# Patient Record
Sex: Female | Born: 1945 | Race: Black or African American | Hispanic: No | Smoking: Former smoker
Health system: Southern US, Community
[De-identification: ages and names within clinical notes are randomized; demographics above are authoritative.]

## PROBLEM LIST (undated history)

## (undated) DIAGNOSIS — E8881 Metabolic syndrome: Secondary | ICD-10-CM

## (undated) DIAGNOSIS — K5730 Diverticulosis of large intestine without perforation or abscess without bleeding: Secondary | ICD-10-CM

## (undated) DIAGNOSIS — K862 Cyst of pancreas: Secondary | ICD-10-CM

## (undated) DIAGNOSIS — N189 Chronic kidney disease, unspecified: Secondary | ICD-10-CM

## (undated) DIAGNOSIS — E119 Type 2 diabetes mellitus without complications: Secondary | ICD-10-CM

## (undated) DIAGNOSIS — I10 Essential (primary) hypertension: Secondary | ICD-10-CM

## (undated) DIAGNOSIS — Z9981 Dependence on supplemental oxygen: Secondary | ICD-10-CM

## (undated) DIAGNOSIS — E039 Hypothyroidism, unspecified: Secondary | ICD-10-CM

## (undated) DIAGNOSIS — M1990 Unspecified osteoarthritis, unspecified site: Secondary | ICD-10-CM

## (undated) DIAGNOSIS — E785 Hyperlipidemia, unspecified: Secondary | ICD-10-CM

## (undated) DIAGNOSIS — T7840XA Allergy, unspecified, initial encounter: Secondary | ICD-10-CM

## (undated) DIAGNOSIS — M109 Gout, unspecified: Secondary | ICD-10-CM

## (undated) DIAGNOSIS — R011 Cardiac murmur, unspecified: Secondary | ICD-10-CM

## (undated) DIAGNOSIS — H269 Unspecified cataract: Secondary | ICD-10-CM

## (undated) DIAGNOSIS — D126 Benign neoplasm of colon, unspecified: Secondary | ICD-10-CM

## (undated) DIAGNOSIS — N289 Disorder of kidney and ureter, unspecified: Secondary | ICD-10-CM

## (undated) DIAGNOSIS — Z8639 Personal history of other endocrine, nutritional and metabolic disease: Secondary | ICD-10-CM

## (undated) DIAGNOSIS — H409 Unspecified glaucoma: Secondary | ICD-10-CM

## (undated) DIAGNOSIS — R0902 Hypoxemia: Secondary | ICD-10-CM

## (undated) DIAGNOSIS — Z789 Other specified health status: Secondary | ICD-10-CM

## (undated) HISTORY — DX: Metabolic syndrome: E88.81

## (undated) HISTORY — DX: Type 2 diabetes mellitus without complications: E11.9

## (undated) HISTORY — DX: Diverticulosis of large intestine without perforation or abscess without bleeding: K57.30

## (undated) HISTORY — DX: Hyperlipidemia, unspecified: E78.5

## (undated) HISTORY — DX: Personal history of other endocrine, nutritional and metabolic disease: Z86.39

## (undated) HISTORY — DX: Essential (primary) hypertension: I10

## (undated) HISTORY — DX: Unspecified osteoarthritis, unspecified site: M19.90

## (undated) HISTORY — PX: COLONOSCOPY: SHX174

## (undated) HISTORY — DX: Allergy, unspecified, initial encounter: T78.40XA

## (undated) HISTORY — DX: Unspecified glaucoma: H40.9

## (undated) HISTORY — DX: Unspecified cataract: H26.9

## (undated) HISTORY — DX: Hypoxemia: R09.02

## (undated) HISTORY — DX: Disorder of kidney and ureter, unspecified: N28.9

## (undated) HISTORY — DX: Chronic kidney disease, unspecified: N18.9

## (undated) HISTORY — DX: Cardiac murmur, unspecified: R01.1

## (undated) HISTORY — DX: Cyst of pancreas: K86.2

## (undated) HISTORY — DX: Benign neoplasm of colon, unspecified: D12.6

## (undated) HISTORY — PX: TONSILLECTOMY: SUR1361

## (undated) HISTORY — DX: Metabolic syndrome: E88.810

## (undated) HISTORY — PX: POLYPECTOMY: SHX149

## (undated) HISTORY — PX: EYE SURGERY: SHX253

## (undated) HISTORY — PX: WISDOM TOOTH EXTRACTION: SHX21

---

## 1979-04-02 HISTORY — PX: THYROIDECTOMY, PARTIAL: SHX18

## 1998-04-01 HISTORY — PX: UMBILICAL HERNIA REPAIR: SHX196

## 1998-04-01 HISTORY — PX: HERNIA REPAIR: SHX51

## 2012-01-22 LAB — LIPID PANEL
HDL: 34 mg/dL — AB (ref 35–70)
LDL Cholesterol: 60 mg/dL
LDl/HDL Ratio: 3.2

## 2012-01-22 LAB — BASIC METABOLIC PANEL
Creatinine: 1.4 mg/dL — AB (ref 0.5–1.1)
Glucose: 0 mg/dL

## 2012-01-22 LAB — HEPATIC FUNCTION PANEL
Alkaline Phosphatase: 71 U/L (ref 25–125)
Bilirubin, Total: 0.5 mg/dL

## 2012-01-22 LAB — CBC AND DIFFERENTIAL: WBC: 5.9 10^3/mL

## 2012-01-27 LAB — HM MAMMOGRAPHY: HM Mammogram: NORMAL

## 2012-10-27 ENCOUNTER — Ambulatory Visit (INDEPENDENT_AMBULATORY_CARE_PROVIDER_SITE_OTHER): Payer: Medicare Other | Admitting: Internal Medicine

## 2012-10-27 ENCOUNTER — Encounter: Payer: Self-pay | Admitting: *Deleted

## 2012-10-27 ENCOUNTER — Other Ambulatory Visit (INDEPENDENT_AMBULATORY_CARE_PROVIDER_SITE_OTHER): Payer: Medicare Other

## 2012-10-27 ENCOUNTER — Encounter: Payer: Self-pay | Admitting: Internal Medicine

## 2012-10-27 VITALS — BP 130/82 | HR 66 | Temp 98.3°F | Wt 211.0 lb

## 2012-10-27 DIAGNOSIS — E669 Obesity, unspecified: Secondary | ICD-10-CM | POA: Insufficient documentation

## 2012-10-27 DIAGNOSIS — I10 Essential (primary) hypertension: Secondary | ICD-10-CM

## 2012-10-27 DIAGNOSIS — Z13820 Encounter for screening for osteoporosis: Secondary | ICD-10-CM

## 2012-10-27 DIAGNOSIS — E785 Hyperlipidemia, unspecified: Secondary | ICD-10-CM | POA: Insufficient documentation

## 2012-10-27 DIAGNOSIS — I129 Hypertensive chronic kidney disease with stage 1 through stage 4 chronic kidney disease, or unspecified chronic kidney disease: Secondary | ICD-10-CM | POA: Insufficient documentation

## 2012-10-27 DIAGNOSIS — N183 Chronic kidney disease, stage 3 unspecified: Secondary | ICD-10-CM | POA: Insufficient documentation

## 2012-10-27 LAB — COMPREHENSIVE METABOLIC PANEL
ALT: 21 U/L (ref 0–35)
CO2: 24 mEq/L (ref 19–32)
Calcium: 10 mg/dL (ref 8.4–10.5)
Chloride: 107 mEq/L (ref 96–112)
GFR: 50.32 mL/min — ABNORMAL LOW (ref >60.00)
Sodium: 138 mEq/L (ref 135–145)
Total Protein: 7.4 g/dL (ref 6.0–8.3)

## 2012-10-27 LAB — HEMOGLOBIN A1C: Hgb A1c MFr Bld: 5.9 % (ref 4.6–6.5)

## 2012-10-27 LAB — CBC
Hemoglobin: 13.4 g/dL (ref 12.0–15.0)
RDW: 13.5 % (ref 11.5–14.6)
WBC: 7.2 10*3/uL (ref 4.5–10.5)

## 2012-10-27 NOTE — Progress Notes (Signed)
HPI  Pt presents to the clinic today to establish care. She recently moved from [place]. She is transferring care from her PCP in [place]. She will need a referral to a kidney specialist for management of her CKD. She has no concerns today.  Flu: yearly Tetanus: unsure Pneumovax: 2013 Zostavax: never Pap smears: 2013 Mammogram: 2013 Bone Density: never Colonoscopy: 2015 Eye doctor: yearly Dentist: yearly  Past Medical History  Diagnosis Date  . Arthritis   . Hyperlipidemia   . Hypertension   . Chronic kidney disease     Current Outpatient Prescriptions  Medication Sig Dispense Refill  . amLODipine (NORVASC) 10 MG tablet Take 10 mg by mouth daily.      . Aspirin-Acetaminophen-Caffeine (EXCEDRIN PO) Take 1 tablet by mouth as needed.      Marland Kitchen atorvastatin (LIPITOR) 20 MG tablet Take 20 mg by mouth daily.      . benazepril (LOTENSIN) 40 MG tablet Take 40 mg by mouth daily.      . carvedilol (COREG) 25 MG tablet Take 25 mg by mouth 2 (two) times daily with a meal.      . cloNIDine (CATAPRES) 0.2 MG tablet Take 0.2 mg by mouth 3 (three) times daily.      . fish oil-omega-3 fatty acids 1000 MG capsule Take 2 g by mouth daily.      . hydrALAZINE (APRESOLINE) 50 MG tablet Take 50 mg by mouth 2 (two) times daily.      . Multiple Vitamin (MULTIVITAMIN) tablet Take 1 tablet by mouth daily.      Marland Kitchen spironolactone (ALDACTONE) 100 MG tablet Take 100 mg by mouth daily.       No current facility-administered medications for this visit.    Not on File  History reviewed. No pertinent family history.  History   Social History  . Marital Status: Divorced    Spouse Name: N/A    Number of Children: N/A  . Years of Education: N/A   Occupational History  . Not on file.   Social History Main Topics  . Smoking status: Not on file  . Smokeless tobacco: Not on file  . Alcohol Use: Not on file  . Drug Use: Not on file  . Sexually Active: Not on file   Other Topics Concern  . Not on file    Social History Narrative  . No narrative on file    ROS:  Constitutional: Denies fever, malaise, fatigue, headache or abrupt weight changes.  HEENT: Denies eye pain, eye redness, ear pain, ringing in the ears, wax buildup, runny nose, nasal congestion, bloody nose, or sore throat. Respiratory: Denies difficulty breathing, shortness of breath, cough or sputum production.   Cardiovascular: Denies chest pain, chest tightness, palpitations or swelling in the hands or feet.  Gastrointestinal: Denies abdominal pain, bloating, constipation, diarrhea or blood in the stool.  GU: Denies frequency, urgency, pain with urination, blood in urine, odor or discharge. Musculoskeletal: Denies decrease in range of motion, difficulty with gait, muscle pain or joint pain and swelling.  Skin: Denies redness, rashes, lesions or ulcercations.  Neurological: Denies dizziness, difficulty with memory, difficulty with speech or problems with balance and coordination.   No other specific complaints in a complete review of systems (except as listed in HPI above).  PE:  BP 130/82  Pulse 66  Temp(Src) 98.3 F (36.8 C) (Oral)  Wt 211 lb (95.709 kg)  SpO2 98% Wt Readings from Last 3 Encounters:  10/27/12 211 lb (95.709 kg)  General: Appears her stated age, well developed, well nourished in NAD. HEENT: Head: normal shape and size; Eyes: sclera white, no icterus, conjunctiva pink, PERRLA and EOMs intact; Ears: Tm's gray and intact, normal light reflex; Nose: mucosa pink and moist, septum midline; Throat/Mouth: Teeth present, mucosa pink and moist, no lesions or ulcerations noted.  Neck: Normal range of motion. Neck supple, trachea midline. No massses, lumps or thyromegaly present.  Cardiovascular: Normal rate and rhythm. S1,S2 noted.  No murmur, rubs or gallops noted. No JVD or BLE edema. No carotid bruits noted. Pulmonary/Chest: Normal effort and positive vesicular breath sounds. No respiratory distress. No  wheezes, rales or ronchi noted.  Abdomen: Soft and nontender. Normal bowel sounds, no bruits noted. No distention or masses noted. Liver, spleen and kidneys non palpable. Musculoskeletal: Normal range of motion. Large right shoulder effusion. No difficulty with gait.  Neurological: Alert and oriented. Cranial nerves II-XII intact. Coordination normal. +DTRs bilaterally. Psychiatric: Mood and affect normal. Behavior is normal. Judgment and thought content normal.      Assessment and Plan:  Prevent health:  Will obtain labs today Will obtain records to see about immunizations Call your insurance company to find out about Zostavax Will order dexa scan

## 2012-10-27 NOTE — Patient Instructions (Signed)
DASH Diet  The DASH diet stands for "Dietary Approaches to Stop Hypertension." It is a healthy eating plan that has been shown to reduce high blood pressure (hypertension) in as little as 14 days, while also possibly providing other significant health benefits. These other health benefits include reducing the risk of breast cancer after menopause and reducing the risk of type 2 diabetes, heart disease, colon cancer, and stroke. Health benefits also include weight loss and slowing kidney failure in patients with chronic kidney disease.   DIET GUIDELINES  · Limit salt (sodium). Your diet should contain less than 1500 mg of sodium daily.  · Limit refined or processed carbohydrates. Your diet should include mostly whole grains. Desserts and added sugars should be used sparingly.  · Include small amounts of heart-healthy fats. These types of fats include nuts, oils, and tub margarine. Limit saturated and trans fats. These fats have been shown to be harmful in the body.  CHOOSING FOODS   The following food groups are based on a 2000 calorie diet. See your Registered Dietitian for individual calorie needs.  Grains and Grain Products (6 to 8 servings daily)  · Eat More Often: Whole-wheat bread, brown rice, whole-grain or wheat pasta, quinoa, popcorn without added fat or salt (air popped).  · Eat Less Often: White bread, white pasta, white rice, cornbread.  Vegetables (4 to 5 servings daily)  · Eat More Often: Fresh, frozen, and canned vegetables. Vegetables may be raw, steamed, roasted, or grilled with a minimal amount of fat.  · Eat Less Often/Avoid: Creamed or fried vegetables. Vegetables in a cheese sauce.  Fruit (4 to 5 servings daily)  · Eat More Often: All fresh, canned (in natural juice), or frozen fruits. Dried fruits without added sugar. One hundred percent fruit juice (½ cup [237 mL] daily).  · Eat Less Often: Dried fruits with added sugar. Canned fruit in light or heavy syrup.  Lean Meats, Fish, and Poultry (2  servings or less daily. One serving is 3 to 4 oz [85-114 g]).  · Eat More Often: Ninety percent or leaner ground beef, tenderloin, sirloin. Round cuts of beef, chicken breast, turkey breast. All fish. Grill, bake, or broil your meat. Nothing should be fried.  · Eat Less Often/Avoid: Fatty cuts of meat, turkey, or chicken leg, thigh, or wing. Fried cuts of meat or fish.  Dairy (2 to 3 servings)  · Eat More Often: Low-fat or fat-free milk, low-fat plain or light yogurt, reduced-fat or part-skim cheese.  · Eat Less Often/Avoid: Milk (whole, 2%). Whole milk yogurt. Full-fat cheeses.  Nuts, Seeds, and Legumes (4 to 5 servings per week)  · Eat More Often: All without added salt.  · Eat Less Often/Avoid: Salted nuts and seeds, canned beans with added salt.  Fats and Sweets (limited)  · Eat More Often: Vegetable oils, tub margarines without trans fats, sugar-free gelatin. Mayonnaise and salad dressings.  · Eat Less Often/Avoid: Coconut oils, palm oils, butter, stick margarine, cream, half and half, cookies, candy, pie.  FOR MORE INFORMATION  The Dash Diet Eating Plan: www.dashdiet.org  Document Released: 03/07/2011 Document Revised: 06/10/2011 Document Reviewed: 03/07/2011  Exit[employer]® Patient Information ©2014 [employer].

## 2012-10-27 NOTE — Assessment & Plan Note (Signed)
Will check RFP today Will refer to nephrology

## 2012-10-27 NOTE — Assessment & Plan Note (Signed)
Will check lipid profile today Continue low fat diet Work on exercise

## 2012-10-27 NOTE — Assessment & Plan Note (Signed)
Well controlled Will check CBC and CMET today Continue current therapy

## 2012-10-27 NOTE — Assessment & Plan Note (Signed)
Maintain a 1400 calorie diet, with a goal of 10 lb weight loss Exercise at least 3 x week

## 2012-10-28 ENCOUNTER — Telehealth: Payer: Self-pay | Admitting: *Deleted

## 2012-10-28 NOTE — Telephone Encounter (Signed)
Spoke with pt advised of result note.   

## 2012-11-16 ENCOUNTER — Encounter: Payer: Self-pay | Admitting: Internal Medicine

## 2012-11-16 DIAGNOSIS — K5730 Diverticulosis of large intestine without perforation or abscess without bleeding: Secondary | ICD-10-CM | POA: Insufficient documentation

## 2012-11-16 DIAGNOSIS — IMO0002 Reserved for concepts with insufficient information to code with codable children: Secondary | ICD-10-CM

## 2012-11-16 DIAGNOSIS — K648 Other hemorrhoids: Secondary | ICD-10-CM | POA: Insufficient documentation

## 2012-11-16 DIAGNOSIS — E8881 Metabolic syndrome: Secondary | ICD-10-CM

## 2012-11-16 DIAGNOSIS — M1710 Unilateral primary osteoarthritis, unspecified knee: Secondary | ICD-10-CM

## 2012-11-16 DIAGNOSIS — E782 Mixed hyperlipidemia: Secondary | ICD-10-CM

## 2012-11-16 DIAGNOSIS — R7301 Impaired fasting glucose: Secondary | ICD-10-CM | POA: Insufficient documentation

## 2012-11-16 DIAGNOSIS — E119 Type 2 diabetes mellitus without complications: Secondary | ICD-10-CM | POA: Insufficient documentation

## 2012-11-16 DIAGNOSIS — E1169 Type 2 diabetes mellitus with other specified complication: Secondary | ICD-10-CM | POA: Insufficient documentation

## 2012-11-16 DIAGNOSIS — M179 Osteoarthritis of knee, unspecified: Secondary | ICD-10-CM | POA: Insufficient documentation

## 2012-11-17 ENCOUNTER — Encounter: Payer: Self-pay | Admitting: Internal Medicine

## 2012-12-17 ENCOUNTER — Other Ambulatory Visit: Payer: Self-pay

## 2012-12-17 DIAGNOSIS — E785 Hyperlipidemia, unspecified: Secondary | ICD-10-CM

## 2012-12-17 MED ORDER — ATORVASTATIN CALCIUM 20 MG PO TABS: 20.0000 mg | ORAL_TABLET | Freq: Every day | ORAL | Status: DC

## 2013-01-08 ENCOUNTER — Encounter: Payer: Self-pay | Admitting: Internal Medicine

## 2013-01-08 ENCOUNTER — Ambulatory Visit (INDEPENDENT_AMBULATORY_CARE_PROVIDER_SITE_OTHER): Payer: Medicare Other | Admitting: Internal Medicine

## 2013-01-08 VITALS — BP 132/84 | HR 85 | Temp 96.7°F | Wt 210.0 lb

## 2013-01-08 DIAGNOSIS — I10 Essential (primary) hypertension: Secondary | ICD-10-CM

## 2013-01-08 MED ORDER — HYDRALAZINE HCL 100 MG PO TABS: 100.0000 mg | ORAL_TABLET | Freq: Two times a day (BID) | ORAL | Status: DC

## 2013-01-08 NOTE — Assessment & Plan Note (Signed)
Will d/c amlodipine Will increase hydralizine to 100 mg BID  RTC in 2 weeks for BP check

## 2013-01-08 NOTE — Patient Instructions (Signed)

## 2013-01-08 NOTE — Progress Notes (Signed)
Subjective:    Patient ID: Jade Sullivan, female    DOB: [birth year], 67 y.o.   MRN: [id number]  HPI  Pt presents to the clinic today to discuss her meds. She has been to her dentist who told her the amlodipine is affecting her gums. She does need some dental work. Her dentist asked that we change her blood pressure medication. It is important that her blood pressure remains controlled or she will not have to do the dental procedure.   Review of Systems      Past Medical History  Diagnosis Date  . Arthritis   . Hyperlipidemia   . Hypertension   . Chronic kidney disease     Current Outpatient Prescriptions  Medication Sig Dispense Refill  . amLODipine (NORVASC) 10 MG tablet Take 10 mg by mouth daily.      Marland Kitchen aspirin 81 MG tablet Take 81 mg by mouth daily.      . Aspirin-Acetaminophen-Caffeine (EXCEDRIN PO) Take 1 tablet by mouth as needed.      Marland Kitchen atorvastatin (LIPITOR) 20 MG tablet Take 1 tablet (20 mg total) by mouth daily.  30 tablet  0  . benazepril (LOTENSIN) 40 MG tablet Take 40 mg by mouth daily.      . carvedilol (COREG) 25 MG tablet Take 25 mg by mouth 2 (two) times daily with a meal.      . cloNIDine (CATAPRES) 0.2 MG tablet Take 0.2 mg by mouth 3 (three) times daily.      . fish oil-omega-3 fatty acids 1000 MG capsule Take 2 g by mouth daily.      . hydrALAZINE (APRESOLINE) 50 MG tablet Take 50 mg by mouth 2 (two) times daily.      . Multiple Vitamin (MULTIVITAMIN) tablet Take 1 tablet by mouth daily.      Marland Kitchen PATANOL 0.1 % ophthalmic solution       . spironolactone (ALDACTONE) 100 MG tablet Take 100 mg by mouth daily.       No current facility-administered medications for this visit.    Not on File  Family History  Problem Relation Age of Onset  . Diabetes Mother     DM2  . Hypertension Mother   . Pneumonia Mother     died from this condition  . Heart disease Father     died from this condition  . Kidney disease Sister     HBP and DM2; relative died from this  condition  . Heart disease Brother     MI - HBP and DM2    History   Social History  . Marital Status: Divorced    Spouse Name: N/A    Number of Children: N/A  . Years of Education: N/A   Occupational History  . Retired    Social History Main Topics  . Smoking status: Never Smoker   . Smokeless tobacco: Not on file  . Alcohol Use: Not on file  . Drug Use: Not on file  . Sexual Activity: Not on file   Other Topics Concern  . Not on file   Social History Narrative   Divorced   Retired - Engineer, petroleum     Constitutional: Denies fever, malaise, fatigue, headache or abrupt weight changes.  Respiratory: Denies difficulty breathing, shortness of breath, cough or sputum production.   Cardiovascular: Denies chest pain, chest tightness, palpitations or swelling in the hands or feet.  Neurological: Denies dizziness, difficulty with memory, difficulty with speech or problems with balance and  coordination.   No other specific complaints in a complete review of systems (except as listed in HPI above).  Objective:   Physical Exam   BP 132/84  Pulse 85  Temp(Src) 96.7 F (35.9 C) (Oral)  Wt 210 lb (95.255 kg)  SpO2 97% Wt Readings from Last 3 Encounters:  01/08/13 210 lb (95.255 kg)  10/27/12 211 lb (95.709 kg)    General: Appears her stated age, well developed, well nourished in NAD.  Cardiovascular: Normal rate and rhythm. S1,S2 noted.  No murmur, rubs or gallops noted. No JVD or BLE edema. No carotid bruits noted. Pulmonary/Chest: Normal effort and positive vesicular breath sounds. No respiratory distress. No wheezes, rales or ronchi noted.  Neurological: Alert and oriented. Cranial nerves II-XII intact. Coordination normal. +DTRs bilaterally.  BMET    Component Value Date/Time   NA 138 10/27/2012 0943   NA 140 01/22/2012   K 4.5 10/27/2012 0943   CL 107 10/27/2012 0943   CO2 24 10/27/2012 0943   GLUCOSE 130* 10/27/2012 0943   BUN 17 10/27/2012 0943   BUN 23*  01/22/2012 0145   CREATININE 1.4* 10/27/2012 0943   CREATININE 1.4* 01/22/2012   CALCIUM 10.0 10/27/2012 0943    Lipid Panel     Component Value Date/Time   CHOL 102 10/27/2012 0943   TRIG 62.0 10/27/2012 0943   HDL 33.50* 10/27/2012 0943   CHOLHDL 3 10/27/2012 0943   VLDL 12.4 10/27/2012 0943   LDLCALC 56 10/27/2012 0943    CBC    Component Value Date/Time   WBC 7.2 10/27/2012 0943   WBC 5.9 01/22/2012   RBC 4.26 10/27/2012 0943   HGB 13.4 10/27/2012 0943   HCT 39.6 10/27/2012 0943   PLT 191.0 10/27/2012 0943   MCV 92.9 10/27/2012 0943   MCHC 33.9 10/27/2012 0943   RDW 13.5 10/27/2012 0943    Hgb A1C Lab Results  Component Value Date   HGBA1C 5.9 10/27/2012        Assessment & Plan:

## 2013-01-21 ENCOUNTER — Other Ambulatory Visit: Payer: Self-pay

## 2013-01-21 DIAGNOSIS — E785 Hyperlipidemia, unspecified: Secondary | ICD-10-CM

## 2013-01-21 DIAGNOSIS — E782 Mixed hyperlipidemia: Secondary | ICD-10-CM

## 2013-01-21 MED ORDER — ATORVASTATIN CALCIUM 20 MG PO TABS: 20.0000 mg | ORAL_TABLET | Freq: Every day | ORAL | Status: DC

## 2013-01-21 NOTE — Telephone Encounter (Signed)
Refilled pt's lipitor to [facility] pharmacy...ds,cma

## 2013-02-03 ENCOUNTER — Other Ambulatory Visit: Payer: Self-pay | Admitting: *Deleted

## 2013-02-03 MED ORDER — CLONIDINE HCL 0.2 MG PO TABS: 0.2000 mg | ORAL_TABLET | Freq: Three times a day (TID) | ORAL | Status: DC

## 2013-02-15 ENCOUNTER — Ambulatory Visit: Payer: Medicare Other | Admitting: Internal Medicine

## 2013-02-17 ENCOUNTER — Encounter: Payer: Self-pay | Admitting: Internal Medicine

## 2013-02-17 ENCOUNTER — Ambulatory Visit (INDEPENDENT_AMBULATORY_CARE_PROVIDER_SITE_OTHER): Payer: Medicare Other | Admitting: Internal Medicine

## 2013-02-17 VITALS — BP 134/80 | HR 71 | Temp 99.2°F | Wt 216.0 lb

## 2013-02-17 DIAGNOSIS — E782 Mixed hyperlipidemia: Secondary | ICD-10-CM

## 2013-02-17 DIAGNOSIS — E785 Hyperlipidemia, unspecified: Secondary | ICD-10-CM

## 2013-02-17 DIAGNOSIS — I10 Essential (primary) hypertension: Secondary | ICD-10-CM

## 2013-02-17 DIAGNOSIS — Z8639 Personal history of other endocrine, nutritional and metabolic disease: Secondary | ICD-10-CM | POA: Insufficient documentation

## 2013-02-17 DIAGNOSIS — N189 Chronic kidney disease, unspecified: Secondary | ICD-10-CM

## 2013-02-17 DIAGNOSIS — Z862 Personal history of diseases of the blood and blood-forming organs and certain disorders involving the immune mechanism: Secondary | ICD-10-CM

## 2013-02-17 DIAGNOSIS — E669 Obesity, unspecified: Secondary | ICD-10-CM

## 2013-02-17 MED ORDER — ATORVASTATIN CALCIUM 20 MG PO TABS: 20.0000 mg | ORAL_TABLET | Freq: Every day | ORAL | Status: DC

## 2013-02-17 MED ORDER — CLONIDINE HCL 0.2 MG/24HR TD PTWK: 0.2000 mg | MEDICATED_PATCH | TRANSDERMAL | Status: DC

## 2013-02-17 NOTE — Assessment & Plan Note (Signed)
Taking and tolerating "statin" therapy. Last LDL July '14 56 - way below goal.

## 2013-02-17 NOTE — Assessment & Plan Note (Signed)
Good control daytime but she awakens with elevated BP . Suspect short acting clonidine and hydralazine are not giving adequate control over night.  Plan Continue present medications but change clonidine from tablets tid to weekly patch  Consider U/S eval for RAS if BP remains poorly controlled.

## 2013-02-17 NOTE — Progress Notes (Signed)
  Subjective:    Patient ID: Jade Sullivan, female    DOB: Jun 05, 1945, 67 y.o.   MRN: [id number]  HPI Jade Sullivan presents for BP follow up. She reports that he AM blood pressure will be elevated to 170's/90's. She is asymptomatice with these elevations. She does better during the day after taking medication but will trend upward. 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 She is taking high dose aldactone, had a flare of gout due to HCTZ. Her last potassium was 4.5. She has a h/o hyperthyroidism but had partial thyroidectomy - last thyroid labs were 1 year ago and were normal by her report. She reports that she did have what sounds like an angiogram for RAS 10-15 years ago that was normal.  Past Medical History  Diagnosis Date  . Arthritis   . Hyperlipidemia   . Hypertension   . Chronic kidney disease    Past Surgical History  Procedure Laterality Date  . Cesarean section  1969  . Hernia repair  2000  . Thyroidectomy, partial  1981   Family History  Problem Relation Age of Onset  . Diabetes Mother     DM2  . Hypertension Mother   . Pneumonia Mother     died from this condition  . Heart disease Father     died from this condition  . Kidney disease Sister     HBP and DM2; relative died from this condition  . Heart disease Brother     MI - HBP and DM2   History   Social History  . Marital Status: Divorced    Spouse Name: N/A    Number of Children: N/A  . Years of Education: N/A   Occupational History  . Retired    Social History Main Topics  . Smoking status: Former Games developer  . Smokeless tobacco: Not on file  . Alcohol Use: No  . Drug Use: No  . Sexual Activity: Not on file   Other Topics Concern  . Not on file   Social History Narrative   Divorced   Retired - Engineer, petroleum      Review of Systems System review is negative for any constitutional, cardiac, pulmonary, GI or neuro symptoms or complaints other than as described in the HPI.       Objective:   Physical Exam Filed Vitals:   02/17/13 0923  BP: 134/80  Pulse: 71  Temp: 99.2 F (37.3 C)   BP Readings from Last 3 Encounters:  02/17/13 134/80  01/08/13 132/84  10/27/12 130/82   Gen'l- WNWD woman in no distress HEENT- proptosis, injection C&S  Cor - 2+ radial pulse, RRR Pulm - normal respiration. neuro - A&O x 3.        Assessment & Plan:

## 2013-02-17 NOTE — Progress Notes (Signed)
Pre visit review using our clinic review tool, if applicable. No additional management support is needed unless otherwise documented below in the visit note. 

## 2013-02-17 NOTE — Assessment & Plan Note (Signed)
Estimated BMI 36  Plan Needs weight management counseling at next visit: smart food choices, PORTION SIZE CONTROL, regular exercise. Target weight 175 lbs

## 2013-02-17 NOTE — Assessment & Plan Note (Signed)
Last Cr 1.4 with est GFR  50 cc/min  Plan Risk reduction  F/u Bmet in Jan '15

## 2013-02-17 NOTE — Patient Instructions (Signed)
Blood pressure - the problem is that your medications are not holding you over night.   Plan Continue all your current medications, taking the Coreg as needed for rapid heart rate.  Change the clonidine pills three times a day for the clonidine patch which you change weekly. This will allow for continuous medication thru the night.   May use the clonidine tablets as need for elevations in the blood pressure greater than 170/90

## 2013-03-21 ENCOUNTER — Encounter: Payer: Self-pay | Admitting: Internal Medicine

## 2013-03-22 MED ORDER — CLONIDINE HCL 0.3 MG/24HR TD PTWK: 0.2000 mg | MEDICATED_PATCH | TRANSDERMAL | Status: DC

## 2013-03-23 ENCOUNTER — Other Ambulatory Visit: Payer: Self-pay

## 2013-03-23 MED ORDER — SPIRONOLACTONE 100 MG PO TABS: 100.0000 mg | ORAL_TABLET | Freq: Every day | ORAL | Status: DC

## 2013-05-06 ENCOUNTER — Encounter: Payer: Self-pay | Admitting: Internal Medicine

## 2013-05-06 DIAGNOSIS — I10 Essential (primary) hypertension: Secondary | ICD-10-CM

## 2013-05-07 MED ORDER — HYDRALAZINE HCL 100 MG PO TABS: 100.0000 mg | ORAL_TABLET | Freq: Two times a day (BID) | ORAL | Status: DC

## 2013-06-20 ENCOUNTER — Encounter: Payer: Self-pay | Admitting: Internal Medicine

## 2013-06-21 ENCOUNTER — Other Ambulatory Visit: Payer: Self-pay | Admitting: Internal Medicine

## 2013-06-21 MED ORDER — BENAZEPRIL HCL 40 MG PO TABS: 40.0000 mg | ORAL_TABLET | Freq: Every day | ORAL | Status: DC

## 2013-08-05 ENCOUNTER — Encounter: Payer: Self-pay | Admitting: Internal Medicine

## 2013-08-06 MED ORDER — CLONIDINE HCL 0.2 MG PO TABS: 0.2000 mg | ORAL_TABLET | Freq: Three times a day (TID) | ORAL | Status: DC

## 2013-09-06 ENCOUNTER — Ambulatory Visit (INDEPENDENT_AMBULATORY_CARE_PROVIDER_SITE_OTHER): Payer: Medicare Other | Admitting: Internal Medicine

## 2013-09-06 ENCOUNTER — Other Ambulatory Visit (INDEPENDENT_AMBULATORY_CARE_PROVIDER_SITE_OTHER): Payer: Medicare Other

## 2013-09-06 ENCOUNTER — Encounter: Payer: Self-pay | Admitting: Internal Medicine

## 2013-09-06 ENCOUNTER — Other Ambulatory Visit: Payer: Self-pay | Admitting: *Deleted

## 2013-09-06 VITALS — BP 152/92 | HR 75 | Temp 98.2°F | Ht 66.0 in | Wt 218.0 lb

## 2013-09-06 DIAGNOSIS — I10 Essential (primary) hypertension: Secondary | ICD-10-CM

## 2013-09-06 DIAGNOSIS — E782 Mixed hyperlipidemia: Secondary | ICD-10-CM

## 2013-09-06 DIAGNOSIS — E669 Obesity, unspecified: Secondary | ICD-10-CM

## 2013-09-06 DIAGNOSIS — Z124 Encounter for screening for malignant neoplasm of cervix: Secondary | ICD-10-CM

## 2013-09-06 DIAGNOSIS — Z0000 Encounter for general adult medical examination without abnormal findings: Secondary | ICD-10-CM

## 2013-09-06 DIAGNOSIS — Z862 Personal history of diseases of the blood and blood-forming organs and certain disorders involving the immune mechanism: Secondary | ICD-10-CM

## 2013-09-06 DIAGNOSIS — Z8639 Personal history of other endocrine, nutritional and metabolic disease: Secondary | ICD-10-CM

## 2013-09-06 DIAGNOSIS — N189 Chronic kidney disease, unspecified: Secondary | ICD-10-CM

## 2013-09-06 DIAGNOSIS — Z1211 Encounter for screening for malignant neoplasm of colon: Secondary | ICD-10-CM

## 2013-09-06 DIAGNOSIS — I129 Hypertensive chronic kidney disease with stage 1 through stage 4 chronic kidney disease, or unspecified chronic kidney disease: Secondary | ICD-10-CM

## 2013-09-06 DIAGNOSIS — Z23 Encounter for immunization: Secondary | ICD-10-CM

## 2013-09-06 DIAGNOSIS — Z1239 Encounter for other screening for malignant neoplasm of breast: Secondary | ICD-10-CM

## 2013-09-06 LAB — URINALYSIS, ROUTINE W REFLEX MICROSCOPIC
Bilirubin Urine: NEGATIVE
HGB URINE DIPSTICK: NEGATIVE
Ketones, ur: NEGATIVE
NITRITE: NEGATIVE
PH: 5.5 (ref 5.0–8.0)
Specific Gravity, Urine: 1.01 (ref 1.000–1.030)
TOTAL PROTEIN, URINE-UPE24: NEGATIVE
Urine Glucose: NEGATIVE
Urobilinogen, UA: 0.2 (ref 0.0–1.0)

## 2013-09-06 LAB — BASIC METABOLIC PANEL
BUN: 13 mg/dL (ref 6–23)
CO2: 27 meq/L (ref 19–32)
Calcium: 10.1 mg/dL (ref 8.4–10.5)
Chloride: 109 mEq/L (ref 96–112)
Creatinine, Ser: 1.1 mg/dL (ref 0.4–1.2)
GFR: 62.9 mL/min (ref >60.00)
Glucose, Bld: 121 mg/dL — ABNORMAL HIGH (ref 70–99)
POTASSIUM: 4.2 meq/L (ref 3.5–5.1)
Sodium: 141 mEq/L (ref 135–145)

## 2013-09-06 LAB — CBC WITH DIFFERENTIAL/PLATELET
BASOS PCT: 0.4 % (ref 0.0–3.0)
Basophils Absolute: 0 10*3/uL (ref 0.0–0.1)
EOS ABS: 0.1 10*3/uL (ref 0.0–0.7)
EOS PCT: 1.1 % (ref 0.0–5.0)
HEMATOCRIT: 41.5 % (ref 36.0–46.0)
Hemoglobin: 13.9 g/dL (ref 12.0–15.0)
Lymphocytes Relative: 20.7 % (ref 12.0–46.0)
Lymphs Abs: 1.9 10*3/uL (ref 0.7–4.0)
MCHC: 33.6 g/dL (ref 30.0–36.0)
MCV: 92.1 fl (ref 78.0–100.0)
MONO ABS: 0.5 10*3/uL (ref 0.1–1.0)
Monocytes Relative: 5.4 % (ref 3.0–12.0)
Neutro Abs: 6.6 10*3/uL (ref 1.4–7.7)
Neutrophils Relative %: 72.4 % (ref 43.0–77.0)
Platelets: 224 10*3/uL (ref 150.0–400.0)
RBC: 4.51 Mil/uL (ref 3.87–5.11)
RDW: 13.7 % (ref 11.5–15.5)
WBC: 9.1 10*3/uL (ref 4.0–10.5)

## 2013-09-06 LAB — HEPATIC FUNCTION PANEL
ALT: 17 U/L (ref 0–35)
AST: 22 U/L (ref 0–37)
Albumin: 4 g/dL (ref 3.5–5.2)
Alkaline Phosphatase: 68 U/L (ref 39–117)
Bilirubin, Direct: 0.1 mg/dL (ref 0.0–0.3)
TOTAL PROTEIN: 7.4 g/dL (ref 6.0–8.3)
Total Bilirubin: 0.6 mg/dL (ref 0.2–1.2)

## 2013-09-06 LAB — LIPID PANEL
CHOLESTEROL: 107 mg/dL (ref 0–200)
HDL: 29 mg/dL — AB (ref >39.00)
LDL Cholesterol: 61 mg/dL (ref 0–99)
NonHDL: 78
TRIGLYCERIDES: 83 mg/dL (ref 0.0–149.0)
Total CHOL/HDL Ratio: 4
VLDL: 16.6 mg/dL (ref 0.0–40.0)

## 2013-09-06 LAB — TSH: TSH: 3.47 u[IU]/mL (ref 0.35–4.50)

## 2013-09-06 MED ORDER — HYDRALAZINE HCL 100 MG PO TABS: 100.0000 mg | ORAL_TABLET | Freq: Two times a day (BID) | ORAL | Status: DC

## 2013-09-06 MED ORDER — AMOXICILLIN-POT CLAVULANATE 875-125 MG PO TABS: 1.0000 | ORAL_TABLET | Freq: Two times a day (BID) | ORAL | Status: AC

## 2013-09-06 NOTE — Assessment & Plan Note (Signed)
Prev followed with renal in [place] -  no OV to establish locally since move to [facility] 03/2012 Refer now Recheck labs

## 2013-09-06 NOTE — Assessment & Plan Note (Signed)
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

## 2013-09-06 NOTE — Assessment & Plan Note (Signed)
Wt Readings from Last 3 Encounters:  09/06/13 218 lb (98.884 kg)  02/17/13 216 lb (97.977 kg)  01/08/13 210 lb (95.255 kg)   The patient is asked to make an attempt to improve diet and exercise patterns to aid in medical management of this problem.

## 2013-09-06 NOTE — Assessment & Plan Note (Signed)
Remote partial thyroidectomy for same (1980s) Exam benign - residual proptosis observed Check TSH now

## 2013-09-06 NOTE — Progress Notes (Signed)
Pre visit review using our clinic review tool, if applicable. No additional management support is needed unless otherwise documented below in the visit note. 

## 2013-09-06 NOTE — Progress Notes (Signed)
Subjective:    Patient ID: Jade Sullivan, female    DOB: 09-07-1945, 68 y.o.   MRN: [id number]  HPI  New patient to me, previously followed with nurse practitioner, establishing with new PCP  patient is here today for annual physical. Patient feels well in general  Also reviewed chronic medical issues and interval medical events:  Past Medical History  Diagnosis Date  . Arthritis   . Hyperlipidemia   . Hypertension   . Chronic kidney disease   . Diverticulosis of colon (without mention of hemorrhage)   . Dysmetabolic syndrome X   . H/O hyperthyroidism     By patient h/o hyperthyroidism with protosis, s/p subtotal thyroidectomy. On no replacement therapy. Last thyroid functions 2013 in [place]  Plan Will need thyroid functions Feb '15 with recommendations to follow.     Family History  Problem Relation Age of Onset  . Diabetes Mother     DM2  . Hypertension Mother   . Pneumonia Mother     died from this condition  . Heart disease Father     died from this condition  . Kidney disease Sister     HBP and DM2; relative died from this condition  . Heart disease Brother     MI - HBP and DM2   History  Substance Use Topics  . Smoking status: Former Research scientist (life sciences)  . Smokeless tobacco: Not on file  . Alcohol Use: No    Review of Systems  Constitutional: Negative for fatigue and unexpected weight change.  HENT: Positive for dental problem, postnasal drip, rhinorrhea, sinus pressure and sneezing. Negative for facial swelling, sore throat, trouble swallowing and voice change.   Respiratory: Negative for cough, shortness of breath and wheezing.   Cardiovascular: Negative for chest pain, palpitations and leg swelling.  Gastrointestinal: Negative for nausea, abdominal pain and diarrhea.  Neurological: Negative for dizziness, weakness, light-headedness and headaches.  Psychiatric/Behavioral: Negative for dysphoric mood. The patient is not nervous/anxious.   All other systems reviewed  and are negative.      Objective:   Physical Exam  BP 152/92  Pulse 75  Temp(Src) 98.2 F (36.8 C) (Oral)  Ht 5\' 6"  (1.676 m)  Wt 218 lb (98.884 kg)  BMI 35.20 kg/m2  SpO2 96% Wt Readings from Last 3 Encounters:  09/06/13 218 lb (98.884 kg)  02/17/13 216 lb (97.977 kg)  01/08/13 210 lb (95.255 kg)   Constitutional: She is obese, but appears well-developed and well-nourished. No distress.  HENT: Head: Normocephalic and atraumatic. Mild sinus tenderness to palp. Ears: B TMs ok, no erythema or effusion; Nose: Nose swollen turbinated. Mouth/Throat: poor dentition, no def abscess -Oropharynx is clear and moist. No oropharyngeal exudate but thick PND evident.  Eyes: Conjunctivae and EOM are normal. Pupils are equal, round, and reactive to light. No scleral icterus.  Neck: s/p R partial thyroidectomy - well healed scar Normal range of motion. Neck supple. No JVD present. No L side thyromegaly or nodule present.  Cardiovascular: Normal rate, regular rhythm and normal heart sounds.  No murmur heard. No BLE edema. Pulmonary/Chest: Effort normal and breath sounds normal. No respiratory distress. She has no wheezes.  Abdominal: Obese, Soft. Bowel sounds are normal. She exhibits no distension. There is no tenderness. no masses GU/breast - defer to gyn - will refer Musculoskeletal: Normal range of motion, no joint effusions. No gross deformities Neurological: She is alert and oriented to person, place, and time. No cranial nerve deficit. Coordination, balance, strength, speech and gait  are normal.  Skin: Skin is warm and dry. No rash noted. No erythema.  Psychiatric: She has a normal mood and affect. Her behavior is normal. Judgment and thought content normal.    Lab Results  Component Value Date   WBC 7.2 10/27/2012   HGB 13.4 10/27/2012   HCT 39.6 10/27/2012   PLT 191.0 10/27/2012   GLUCOSE 130* 10/27/2012   CHOL 102 10/27/2012   TRIG 62.0 10/27/2012   HDL 33.50* 10/27/2012   LDLCALC 56  10/27/2012   ALT 21 10/27/2012   AST 22 10/27/2012   NA 138 10/27/2012   K 4.5 10/27/2012   CL 107 10/27/2012   CREATININE 1.4* 10/27/2012   BUN 17 10/27/2012   CO2 24 10/27/2012   HGBA1C 5.9 10/27/2012    Patient was never admitted.     Assessment & Plan:   CPX/v70.0 - Patient has been counseled on age-appropriate routine health concerns for screening and prevention. These are reviewed and up-to-date. Immunizations are up-to-date or declined. 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     Obesity, unspecified      Wt Readings from Last 3 Encounters:  09/06/13 218 lb (98.884 kg)  02/17/13 216 lb (97.977 kg)  01/08/13 210 lb (95.255 kg)   The patient is asked to make an attempt to improve diet and exercise patterns to aid in medical management of this problem.      Other Visit Diagnoses   Routine general medical  examination at a health care facility    -  Primary    Relevant Orders       Basic metabolic panel       CBC with Differential       Hepatic function panel       Lipid panel       TSH       Urinalysis, Routine w reflex microscopic    Screening for cervical cancer        Relevant Orders       Ambulatory referral to Obstetrics / Gynecology    Special screening for malignant neoplasms, colon        Relevant Orders       Ambulatory referral to Gastroenterology    Other screening  breast examination        Relevant Orders       MM DIGITAL SCREENING BILATERAL       Sinus infection versus dental infection -will treat with Augmentin pending dental followup which patient reports is on hold until blood pressure better control -afebrile

## 2013-09-06 NOTE — Telephone Encounter (Signed)
Pt sent email needing refill on her hydralazine...Jade Sullivan

## 2013-09-06 NOTE — Patient Instructions (Addendum)
It was good to see you today.  We have reviewed your prior records including labs and tests today  Health Maintenance reviewed - tetanus immunization updated today. Will also refer for mammogram, colonoscopy screening, gynecology evaluation -all other recommended immunizations and age-appropriate screenings are up-to-date.  Test(s) ordered today. Your results will be released to [place] (or called to you) after review, usually within 72hours after test completion. If any changes need to be made, you will be notified at that same time.  Medications reviewed and updated, no new changes recommended at this time, but we will get a kidney specialist to help [place] with your blood pressure control.  we'll make referral to pathology, kidney specialist . Our office will contact you regarding appointment(s) once made.  Please schedule followup in 4 months for recheck and labs, call sooner if problems.  Health Maintenance, Female A healthy lifestyle and preventative care can promote health and wellness.  Maintain regular health, dental, and eye exams.  Eat a healthy diet. Foods like vegetables, fruits, whole grains, low-fat dairy products, and lean protein foods contain the nutrients you need without too many calories. Decrease your intake of foods high in solid fats, added sugars, and salt. Get information about a proper diet from your caregiver, if necessary.  Regular physical exercise is one of the most important things you can do for your health. Most adults should get at least 150 minutes of moderate-intensity exercise (any activity that increases your heart rate and causes you to sweat) each week. In addition, most adults need muscle-strengthening exercises on 2 or more days a week.   Maintain a healthy weight. The body mass index (BMI) is a screening tool to identify possible weight problems. It provides an estimate of body fat based on height and weight. Your caregiver can help determine your BMI,  and can help you achieve or maintain a healthy weight. For adults 20 years and older:  A BMI below 18.5 is considered underweight.  A BMI of 18.5 to 24.9 is normal.  A BMI of 25 to 29.9 is considered overweight.  A BMI of 30 and above is considered obese.  Maintain normal blood lipids and cholesterol by exercising and minimizing your intake of saturated fat. Eat a balanced diet with plenty of fruits and vegetables. Blood tests for lipids and cholesterol should begin at age 28 and be repeated every 5 years. If your lipid or cholesterol levels are high, you are over 50, or you are a high risk for heart disease, you may need your cholesterol levels checked more frequently.Ongoing high lipid and cholesterol levels should be treated with medicines if diet and exercise are not effective.  If you smoke, find out from your caregiver how to quit. If you do not use tobacco, do not start.  Lung cancer screening is recommended for adults aged 24 80 years who are at high risk for developing lung cancer because of a history of smoking. Yearly low-dose computed tomography (CT) is recommended for people who have at least a 30-pack-year history of smoking and are a current smoker or have quit within the past 15 years. A pack year of smoking is smoking an average of 1 pack of cigarettes a day for 1 year (for example: 1 pack a day for 30 years or 2 packs a day for 15 years). Yearly screening should continue until the smoker has stopped smoking for at least 15 years. Yearly screening should also be stopped for people who develop a health problem  that would prevent them from having lung cancer treatment.  If you are pregnant, do not drink alcohol. If you are breastfeeding, be very cautious about drinking alcohol. If you are not pregnant and choose to drink alcohol, do not exceed 1 drink per day. One drink is considered to be 12 ounces (355 mL) of beer, 5 ounces (148 mL) of wine, or 1.5 ounces (44 mL) of liquor.  Avoid  use of street drugs. Do not share needles with anyone. Ask for help if you need support or instructions about stopping the use of drugs.  High blood pressure causes heart disease and increases the risk of stroke. Blood pressure should be checked at least every 1 to 2 years. Ongoing high blood pressure should be treated with medicines, if weight loss and exercise are not effective.  If you are 35 to 68 years old, ask your caregiver if you should take aspirin to prevent strokes.  Diabetes screening involves taking a blood sample to check your fasting blood sugar level. This should be done once every 3 years, after age 24, if you are within normal weight and without risk factors for diabetes. Testing should be considered at a younger age or be carried out more frequently if you are overweight and have at least 1 risk factor for diabetes.  Breast cancer screening is essential preventative care for women. You should practice "breast self-awareness." This means understanding the normal appearance and feel of your breasts and may include breast self-examination. Any changes detected, no matter how small, should be reported to a caregiver. Women in their 71s and 30s should have a clinical breast exam (CBE) by a caregiver as part of a regular health exam every 1 to 3 years. After age 37, women should have a CBE every year. Starting at age 68, women should consider having a mammogram (breast X-ray) every year. Women who have a family history of breast cancer should talk to their caregiver about genetic screening. Women at a high risk of breast cancer should talk to their caregiver about having an MRI and a mammogram every year.  Breast cancer gene (BRCA)-related cancer risk assessment is recommended for women who have family members with BRCA-related cancers. BRCA-related cancers include breast, ovarian, tubal, and peritoneal cancers. Having family members with these cancers may be associated with an increased risk  for harmful changes (mutations) in the breast cancer genes BRCA1 and BRCA2. Results of the assessment will determine the need for genetic counseling and BRCA1 and BRCA2 testing.  The Pap test is a screening test for cervical cancer. Women should have a Pap test starting at age 67. Between ages 39 and 10, Pap tests should be repeated every 2 years. Beginning at age 15, you should have a Pap test every 3 years as long as the past 3 Pap tests have been normal. If you had a hysterectomy for a problem that was not cancer or a condition that could lead to cancer, then you no longer need Pap tests. If you are between ages 85 and 19, and you have had normal Pap tests going back 10 years, you no longer need Pap tests. If you have had past treatment for cervical cancer or a condition that could lead to cancer, you need Pap tests and screening for cancer for at least 20 years after your treatment. If Pap tests have been discontinued, risk factors (such as a new sexual partner) need to be reassessed to determine if screening should be resumed. Some women  have medical problems that increase the chance of getting cervical cancer. In these cases, your caregiver may recommend more frequent screening and Pap tests.  The human papillomavirus (HPV) test is an additional test that may be used for cervical cancer screening. The HPV test looks for the virus that can cause the cell changes on the cervix. The cells collected during the Pap test can be tested for HPV. The HPV test could be used to screen women aged 65 years and older, and should be used in women of any age who have unclear Pap test results. After the age of 35, women should have HPV testing at the same frequency as a Pap test.  Colorectal cancer can be detected and often prevented. Most routine colorectal cancer screening begins at the age of 53 and continues through age 71. However, your caregiver may recommend screening at an earlier age if you have risk factors for  colon cancer. On a yearly basis, your caregiver may provide home test kits to check for hidden blood in the stool. Use of a small camera at the end of a tube, to directly examine the colon (sigmoidoscopy or colonoscopy), can detect the earliest forms of colorectal cancer. Talk to your caregiver about this at age 49, when routine screening begins. Direct examination of the colon should be repeated every 5 to 10 years through age 77, unless early forms of pre-cancerous polyps or small growths are found.  Hepatitis C blood testing is recommended for all people born from [birth year] through 1965 and any individual with known risks for hepatitis C.  Practice safe sex. Use condoms and avoid high-risk sexual practices to reduce the spread of sexually transmitted infections (STIs). Sexually active women aged 72 and younger should be checked for Chlamydia, which is a common sexually transmitted infection. Older women with new or multiple partners should also be tested for Chlamydia. Testing for other STIs is recommended if you are sexually active and at increased risk.  Osteoporosis is a disease in which the bones lose minerals and strength with aging. This can result in serious bone fractures. The risk of osteoporosis can be identified using a bone density scan. Women ages 36 and over and women at risk for fractures or osteoporosis should discuss screening with their caregivers. Ask your caregiver whether you should be taking a calcium supplement or vitamin D to reduce the rate of osteoporosis.  Menopause can be associated with physical symptoms and risks. Hormone replacement therapy is available to decrease symptoms and risks. You should talk to your caregiver about whether hormone replacement therapy is right for you.  Use sunscreen. Apply sunscreen liberally and repeatedly throughout the day. You should seek shade when your shadow is shorter than you. Protect yourself by wearing long sleeves, pants, a wide-brimmed  hat, and sunglasses year round, whenever you are outdoors.  Notify your caregiver of new moles or changes in moles, especially if there is a change in shape or color. Also notify your caregiver if a mole is larger than the size of a pencil eraser.  Stay current with your immunizations. Document Released: 10/01/2010 Document Revised: 07/13/2012 Document Reviewed: 10/01/2010 [facility] Patient Information 2014 [place]. Hypertension As your heart beats, it forces blood through your arteries. This force is your blood pressure. If the pressure is too high, it is called hypertension (HTN) or high blood pressure. HTN is dangerous because you may have it and not know it. High blood pressure may mean that your heart has to work  harder to pump blood. Your arteries may be narrow or stiff. The extra work puts you at risk for heart disease, stroke, and other problems.  Blood pressure consists of two numbers, a higher number over a lower, 110/72, for example. It is stated as "110 over 72." The ideal is below 120 for the top number (systolic) and under 80 for the bottom (diastolic). Write down your blood pressure today. You should pay close attention to your blood pressure if you have certain conditions such as:  Heart failure.  Prior heart attack.  Diabetes  Chronic kidney disease.  Prior stroke.  Multiple risk factors for heart disease. To see if you have HTN, your blood pressure should be measured while you are seated with your arm held at the level of the heart. It should be measured at least twice. A one-time elevated blood pressure reading (especially in the Emergency Department) does not mean that you need treatment. There may be conditions in which the blood pressure is different between your right and left arms. It is important to see your caregiver soon for a recheck. Most people have essential hypertension which means that there is not a specific cause. This type of high blood pressure may  be lowered by changing lifestyle factors such as:  Stress.  Smoking.  Lack of exercise.  Excessive weight.  Drug/tobacco/alcohol use.  Eating less salt. Most people do not have symptoms from high blood pressure until it has caused damage to the body. Effective treatment can often prevent, delay or reduce that damage. TREATMENT  When a cause has been identified, treatment for high blood pressure is directed at the cause. There are a large number of medications to treat HTN. These fall into several categories, and your caregiver will help you select the medicines that are best for you. Medications may have side effects. You should review side effects with your caregiver. If your blood pressure stays high after you have made lifestyle changes or started on medicines,   Your medication(s) may need to be changed.  Other problems may need to be addressed.  Be certain you understand your prescriptions, and know how and when to take your medicine.  Be sure to follow up with your caregiver within the time frame advised (usually within two weeks) to have your blood pressure rechecked and to review your medications.  If you are taking more than one medicine to lower your blood pressure, make sure you know how and at what times they should be taken. Taking two medicines at the same time can result in blood pressure that is too low. SEEK IMMEDIATE MEDICAL CARE IF:  You develop a severe headache, blurred or changing vision, or confusion.  You have unusual weakness or numbness, or a faint feeling.  You have severe chest or abdominal pain, vomiting, or breathing problems. MAKE SURE YOU:   Understand these instructions.  Will watch your condition.  Will get help right away if you are not doing well or get worse. Document Released: 03/18/2005 Document Revised: 06/10/2011 Document Reviewed: 11/06/2007 [facility] Patient Information 2014 [place]. Kidney Disease, Adult The kidneys are  two organs that lie on either side of the spine between the middle of the back and the front of the abdomen. The kidneys:   Remove wastes and extra water from the blood.   Produce important hormones. These regulate blood pressure, help keep bones strong, and help create red blood cells.   Balance the fluids and chemicals in the blood and  tissues. Kidney disease occurs when the kidneys are damaged. Kidney damage may be sudden (acute) or develop over a long period (chronic). A small amount of damage may not cause problems, but a large amount of damage may make it difficult or impossible for the kidneys to work the way they should. Early detection and treatment of kidney disease may prevent kidney damage from becoming permanent or getting worse. Some kidney diseases are curable, but most are not. Many people with kidney disease are able to control the disease and live a normal life.  TYPES OF KIDNEY DISEASE  Acute kidney injury.Acute kidney injury occurs when there is sudden damage to the kidneys.  Chronic kidney disease. Chronic kidney disease occurs when the kidneys are damaged over a long period.  End-stage kidney disease. End-stage kidney disease occurs when the kidneys are so damaged that they stop working. In end-stage kidney disease, the kidneys cannot get better. CAUSES Any condition, disease, or event that damages the kidneys may cause kidney disease. Acute kidney injury.  A problem with blood flow to the kidneys. This may be caused by:   Blood loss.   Heart disease.   Severe burns.   Liver disease.  Direct damage to the kidneys. This may be caused by:  Some medicines.   A kidney infection.   Poisoning or consuming toxic substances.   A surgical wound.   A blow to the kidney area.   A problem with urine flow. This may be caused by:   Cancer.   Kidney stones.   An enlarged prostate. Chronic kidney disease. The most common causes of chronic kidney  disease are diabetes and high blood pressure (hypertension). Chronic kidney disease may also be caused by:   Diseases that cause the filtering units of the kidneys to become inflamed.   Diseases that affect the immune system.   Genetic diseases.   Medicines that damage the kidneys, such as anti-inflammatory medicines.  Poisoning or exposure to toxic substances.   A reoccurring kidney or urinary infection.   A problem with urine flow. This may be caused by:  Cancer.   Kidney stones.   An enlarged prostate in males. End-stage kidney disease. This kidney disease usually occurs when a chronic kidney disease gets worse. It may also occur after acute kidney injury.  SYMPTOMS   Swelling (edema) of the legs, ankles, or feet.   Tiredness (lethargy).   Nausea or vomiting.   Confusion.   Problems with urination, such as:   Painful or burning feeling during urination.   Decreased urine production.  Bloody urine.   Frequent urination, especially at night.  Hypertension.  Muscle twitches and cramps.   Shortness of breath.   Persistent itchiness.   Loss of appetite.  Metallic taste in the mouth.   Weakness.   Seizures.   Chest pain or pressure.   Trouble sleeping.   Headaches.   Abnormally dark or light skin.   Numbness in the hands or feet.   Easy bruising.   Frequent hiccups.   Menstruation stops. Sometimes, no symptoms are present. DIAGNOSIS  Kidney disease may be detected and diagnosed by tests, including blood, urine, imaging, or kidney biopsy tests.  TREATMENT  Acute kidney injury. Treatment of acute kidney injury varies depending on the cause and severity of the kidney damage. In mild cases, no treatment may be needed. The kidneys may heal on their own. If acute kidney injury is more severe, your caregiver will treat the cause of the kidney damage,  help the kidneys heal, and prevent complications from occurring.  Severe cases may require a procedure to remove toxic wastes from the body (dialysis) or surgery to repair kidney damage. Surgery may involve:   Repair of a torn kidney.   Removal of an obstruction.  Most of the time, you will need to stay overnight at the hospital.  Chronic kidney disease. Most chronic kidney diseases cannot be cured. Treatment usually involves relieving symptoms and preventing or slowing the progression of the disease. Treatment may include:   A special diet. You may need to avoid alcohol and foods that:   Have added salt.   Are high in potassium.   Are high in protein.   Medicines. These may:   Lower blood pressure.   Relieve anemia.   Relieve swelling.   Protect the bones.  End-stage kidney disease. End-stage kidney disease is life-threatening and must be treated immediately. There are two treatments for end-stage kidney disease:   Dialysis.   Receiving a new kidney (kidney transplant). Both of these treatments have serious risks and consequences. In addition to having dialysis or a kidney transplant, you may need to take medicines to control hypertension and cholesterol and to decrease phosphorus levels in your blood. LENGTH OF ILLNESS  Acute kidney injury.The length of this disease varies greatly from person to person. Exactly how long it lasts depends on the cause of the kidney damage. Acute kidney injury may develop into chronic kidney disease or end-stage kidney disease.  Chronic kidney disease. This disease usually lasts a lifetime. Chronic kidney disease may worsen over time to become end-stage kidney disease. The time it takes for end-stage kidney disease to develop varies from person to person.  End-stage kidney disease. This disease lasts until a kidney transplant is performed. PREVENTION  Kidney disease can sometimes be prevented. If you have diabetes, hypertension, or any other condition that may lead to kidney disease, you should  try to prevent kidney disease with:   An appropriate diet.  Medicine.  Lifestyle changes. FOR MORE INFORMATION  American Association of Kidney Patients: BombTimer.gl  National Kidney Foundation: www.kidney.[place]: https://mathis.com/  [facility]: www.lifeoptions.org and www.kidneyschool.org  Document Released: 03/18/2005 Document Revised: 03/04/2012 Document Reviewed: 11/15/2011 [facility] Patient Information 2014 [place], [place].

## 2013-09-06 NOTE — Assessment & Plan Note (Signed)
On atorva- last lipids at goal Check annually Titrate as needed The current medical regimen is effective;  continue present plan and medications.

## 2013-09-18 ENCOUNTER — Encounter: Payer: Self-pay | Admitting: Internal Medicine

## 2013-09-21 ENCOUNTER — Other Ambulatory Visit: Payer: Self-pay | Admitting: *Deleted

## 2013-09-21 DIAGNOSIS — E785 Hyperlipidemia, unspecified: Secondary | ICD-10-CM

## 2013-09-21 MED ORDER — ATORVASTATIN CALCIUM 20 MG PO TABS: 20.0000 mg | ORAL_TABLET | Freq: Every day | ORAL | Status: DC

## 2013-09-21 MED ORDER — CLONIDINE HCL 0.2 MG PO TABS: 0.2000 mg | ORAL_TABLET | Freq: Three times a day (TID) | ORAL | Status: DC

## 2013-09-21 MED ORDER — CLONIDINE HCL 0.3 MG/24HR TD PTWK: 0.2000 mg | MEDICATED_PATCH | TRANSDERMAL | Status: DC

## 2013-09-21 MED ORDER — BENAZEPRIL HCL 40 MG PO TABS: 40.0000 mg | ORAL_TABLET | Freq: Every day | ORAL | Status: DC

## 2013-09-21 MED ORDER — HYDRALAZINE HCL 100 MG PO TABS: 100.0000 mg | ORAL_TABLET | Freq: Two times a day (BID) | ORAL | Status: DC

## 2013-09-21 MED ORDER — SPIRONOLACTONE 100 MG PO TABS: 100.0000 mg | ORAL_TABLET | Freq: Every day | ORAL | Status: DC

## 2013-10-13 ENCOUNTER — Ambulatory Visit
Admission: RE | Admit: 2013-10-13 | Discharge: 2013-10-13 | Disposition: A | Discharge status: Home / Self Care | Payer: Medicare Other | Source: Ambulatory Visit | Attending: Internal Medicine | Admitting: Internal Medicine

## 2013-10-13 ENCOUNTER — Other Ambulatory Visit: Payer: Self-pay | Admitting: Internal Medicine

## 2013-10-13 ENCOUNTER — Encounter: Payer: Self-pay | Admitting: Internal Medicine

## 2013-10-13 ENCOUNTER — Ambulatory Visit (HOSPITAL_COMMUNITY): Payer: Medicare Other | Attending: Internal Medicine

## 2013-10-13 DIAGNOSIS — Z1231 Encounter for screening mammogram for malignant neoplasm of breast: Secondary | ICD-10-CM

## 2013-10-14 ENCOUNTER — Encounter: Payer: Self-pay | Admitting: Internal Medicine

## 2013-10-21 ENCOUNTER — Ambulatory Visit: Payer: Self-pay | Admitting: Gynecology

## 2013-11-09 ENCOUNTER — Ambulatory Visit (INDEPENDENT_AMBULATORY_CARE_PROVIDER_SITE_OTHER): Payer: Medicare Other | Admitting: Gynecology

## 2013-11-09 ENCOUNTER — Other Ambulatory Visit (HOSPITAL_COMMUNITY)
Admission: RE | Admit: 2013-11-09 | Discharge: 2013-11-09 | Disposition: A | Discharge status: Home / Self Care | Payer: Medicare Other | Source: Ambulatory Visit | Attending: Gynecology | Admitting: Gynecology

## 2013-11-09 ENCOUNTER — Encounter: Payer: Self-pay | Admitting: Gynecology

## 2013-11-09 VITALS — BP 144/86 | Ht 66.0 in | Wt 221.0 lb

## 2013-11-09 DIAGNOSIS — Z124 Encounter for screening for malignant neoplasm of cervix: Secondary | ICD-10-CM | POA: Diagnosis present

## 2013-11-09 DIAGNOSIS — N952 Postmenopausal atrophic vaginitis: Secondary | ICD-10-CM

## 2013-11-09 DIAGNOSIS — Z1151 Encounter for screening for human papillomavirus (HPV): Secondary | ICD-10-CM | POA: Insufficient documentation

## 2013-11-09 DIAGNOSIS — Z780 Asymptomatic menopausal state: Secondary | ICD-10-CM

## 2013-11-09 DIAGNOSIS — N951 Menopausal and female climacteric states: Secondary | ICD-10-CM

## 2013-11-09 NOTE — Patient Instructions (Signed)

## 2013-11-09 NOTE — Addendum Note (Signed)
Addended by: Nelva Nay on: 11/09/2013 12:18 PM   Modules accepted: Orders

## 2013-11-09 NOTE — Progress Notes (Signed)
Jade Sullivan [birth year] [id number]   History:    68 y.o.  today as a new patient in the practice. Patient moved to [facility] from [place], [place] 1-1/2 years ago. She has established her medical care with her PCP Dr. Asa Lente who has been doing her blood work and is treating her for hypertension and hyperlipidemia and chronic kidney disease. Patient scheduled for colonoscopy next month. She had a normal mammogram in July of this year. She was diagnosed in 1982 with Graves' disease and had a partial thyroidectomy currently on no hormone. Patient reached menopause in her 88s and was never on any HRT although she has sometimes vasomotor symptoms and occasional vaginal dryness. Patient denies any past history of any abnormal Pap smear.  Past medical history,surgical history, family history and social history were all reviewed and documented in the EPIC chart.  Gynecologic History No LMP recorded. Patient is postmenopausal. Contraception: post menopausal status Last Pap: Over 2 years ago. Results were: normal Last mammogram: 2015. Results were: normal  Obstetric History OB History  Gravida Para Term Preterm AB SAB TAB Ectopic Multiple Living  1 1        1     # Outcome Date GA Lbr Len/2nd Weight Sex Delivery Anes PTL Lv  1 PAR                ROS: A ROS was performed and pertinent positives and negatives are included in the history.  GENERAL: No fevers or chills. HEENT: No change in vision, no earache, sore throat or sinus congestion. NECK: No pain or stiffness. CARDIOVASCULAR: No chest pain or pressure. No palpitations. PULMONARY: No shortness of breath, cough or wheeze. GASTROINTESTINAL: No abdominal pain, nausea, vomiting or diarrhea, melena or bright red blood per rectum. GENITOURINARY: No urinary frequency, urgency, hesitancy or dysuria. MUSCULOSKELETAL: No joint or muscle pain, no back pain, no recent trauma. DERMATOLOGIC: No rash, no itching, no lesions. ENDOCRINE: No  polyuria, polydipsia, no heat or cold intolerance. No recent change in weight. HEMATOLOGICAL: No anemia or easy bruising or bleeding. NEUROLOGIC: No headache, seizures, numbness, tingling or weakness. PSYCHIATRIC: No depression, no loss of interest in normal activity or change in sleep pattern.     Exam: chaperone present  BP 144/86  Ht 5\' 6"  (1.676 m)  Wt 221 lb (100.245 kg)  BMI 35.69 kg/m2  Body mass index is 35.69 kg/(m^2).  General appearance : Well developed well nourished female. No acute distress HEENT: Neck supple, trachea midline, no carotid bruits, no thyroidmegaly Lungs: Clear to auscultation, no rhonchi or wheezes, or rib retractions  Heart: Regular rate and rhythm, no murmurs or gallops Breast:Examined in sitting and supine position were symmetrical in appearance, no palpable masses or tenderness,  no skin retraction, no nipple inversion, no nipple discharge, no skin discoloration, no axillary or supraclavicular lymphadenopathy Abdomen: no palpable masses or tenderness, no rebound or guarding Extremities: no edema or skin discoloration or tenderness  Pelvic:  Bartholin, Urethra, Skene Glands: Within normal limits             Vagina: No gross lesions or discharge, vaginal atrophy  Cervix: No gross lesions or discharge  Uterus  anteverted, normal size, shape and consistency, non-tender and mobile  Adnexa  Without masses or tenderness  Anus and perineum  normal   Rectovaginal  normal sphincter tone without palpated masses or tenderness             Hemoccult colonoscopy next month  Assessment/Plan:  68 y.o. female had complete gynecological exam today. Mild vaginal atrophy was noted. The patient now requesting any treatment at the present time. Pap smear with HPV screen done today. Patient with no prior bone density study evaluation will be scheduled here in the office the next few weeks. We discussed the importance of calcium vitamin D and regular exercise for  osteoporosis prevention. We also discussed the importance of monthly self breast exams. Patient's vaccines are up-to-date.  Note: This dictation was prepared with  Dragon/digital dictation along withSmart phrase technology. Any transcriptional errors that result from this process are unintentional.   Terrance Mass MD, 12:02 PM 11/09/2013

## 2013-11-11 LAB — CYTOLOGY - PAP

## 2013-12-08 ENCOUNTER — Ambulatory Visit (AMBULATORY_SURGERY_CENTER): Payer: Self-pay

## 2013-12-08 VITALS — Ht 66.0 in | Wt 224.4 lb

## 2013-12-08 DIAGNOSIS — Z1211 Encounter for screening for malignant neoplasm of colon: Secondary | ICD-10-CM

## 2013-12-08 MED ORDER — MOVIPREP 100 G PO SOLR: 1.0000 | Freq: Once | ORAL | Status: DC

## 2013-12-08 NOTE — Progress Notes (Signed)
No allergies to eggs or soy No diet/weight loss meds nohome oxygen No past problems with anesthesia  Has email  Emmi instructions given for colonoscopy 

## 2013-12-20 ENCOUNTER — Other Ambulatory Visit: Payer: Self-pay | Admitting: Gynecology

## 2013-12-20 ENCOUNTER — Ambulatory Visit (INDEPENDENT_AMBULATORY_CARE_PROVIDER_SITE_OTHER): Payer: Medicare Other

## 2013-12-20 DIAGNOSIS — Z780 Asymptomatic menopausal state: Secondary | ICD-10-CM

## 2013-12-22 ENCOUNTER — Encounter: Payer: Self-pay | Admitting: Internal Medicine

## 2013-12-22 ENCOUNTER — Encounter: Payer: Medicare Other | Admitting: Internal Medicine

## 2013-12-22 ENCOUNTER — Ambulatory Visit (AMBULATORY_SURGERY_CENTER): Payer: Medicare Other | Admitting: Internal Medicine

## 2013-12-22 VITALS — BP 149/52 | HR 60 | Temp 96.5°F | Resp 19 | Ht 66.0 in | Wt 224.0 lb

## 2013-12-22 DIAGNOSIS — D126 Benign neoplasm of colon, unspecified: Secondary | ICD-10-CM

## 2013-12-22 DIAGNOSIS — Z1211 Encounter for screening for malignant neoplasm of colon: Secondary | ICD-10-CM

## 2013-12-22 DIAGNOSIS — K5730 Diverticulosis of large intestine without perforation or abscess without bleeding: Secondary | ICD-10-CM

## 2013-12-22 MED ORDER — SODIUM CHLORIDE 0.9 % IV SOLN: 500.0000 mL | INTRAVENOUS | Status: DC

## 2013-12-22 NOTE — Patient Instructions (Signed)
YOU HAD AN ENDOSCOPIC PROCEDURE TODAY AT THE [facility]: Refer to the procedure report that was given to you for any specific questions about what was found during the examination.  If the procedure report does not answer your questions, please call your gastroenterologist to clarify.  If you requested that your care partner not be given the details of your procedure findings, then the procedure report has been included in a sealed envelope for you to review at your convenience later.  YOU SHOULD EXPECT: Some feelings of bloating in the abdomen. Passage of more gas than usual.  Walking can help get rid of the air that was put into your GI tract during the procedure and reduce the bloating. If you had a lower endoscopy (such as a colonoscopy or flexible sigmoidoscopy) you may notice spotting of blood in your stool or on the toilet paper. If you underwent a bowel prep for your procedure, then you may not have a normal bowel movement for a few days.  DIET: Your first meal following the procedure should be a light meal and then it is ok to progress to your normal diet.  A half-sandwich or bowl of soup is an example of a good first meal.  Heavy or fried foods are harder to digest and may make you feel nauseous or bloated.  Likewise meals heavy in dairy and vegetables can cause extra gas to form and this can also increase the bloating.  Drink plenty of fluids but you should avoid alcoholic beverages for 24 hours.  ACTIVITY: Your care partner should take you home directly after the procedure.  You should plan to take it easy, moving slowly for the rest of the day.  You can resume normal activity the day after the procedure however you should NOT DRIVE or use heavy machinery for 24 hours (because of the sedation medicines used during the test).    SYMPTOMS TO REPORT IMMEDIATELY: A gastroenterologist can be reached at any hour.  During normal business hours, 8:30 AM to 5:00 PM Monday through Friday,  call [phone].  After hours and on weekends, please call the GI answering service at [phone] who will take a message and have the physician on call contact you.   Following lower endoscopy (colonoscopy or flexible sigmoidoscopy):  Excessive amounts of blood in the stool  Significant tenderness or worsening of abdominal pains  Swelling of the abdomen that is new, acute  Fever of 100F or higher  FOLLOW UP: If any biopsies were taken you will be contacted by phone or by letter within the next 1-3 weeks.  Call your gastroenterologist if you have not heard about the biopsies in 3 weeks.  Our staff will call the home number listed on your records the next business day following your procedure to check on you and address any questions or concerns that you may have at that time regarding the information given to you following your procedure. This is a courtesy call and so if there is no answer at the home number and we have not heard from you through the emergency physician on call, we will assume that you have returned to your regular daily activities without incident.  SIGNATURES/CONFIDENTIALITY: You and/or your care partner have signed paperwork which will be entered into your electronic medical record.  These signatures attest to the fact that that the information above on your After Visit Summary has been reviewed and is understood.  Full responsibility of the confidentiality of this   discharge information lies with you and/or your care-partner.  Await pathology  Please read over handouts about polyps, diverticulosis and high fiber diets  Please continue your normal medications

## 2013-12-22 NOTE — Progress Notes (Signed)
Procedure ends, to recovery, report given and VSS. 

## 2013-12-22 NOTE — Progress Notes (Signed)
Called to room to assist during endoscopic procedure.  Patient ID and intended procedure confirmed with present staff. Received instructions for my participation in the procedure from the performing physician.  

## 2013-12-22 NOTE — Op Note (Signed)
[place]  [address] [place], [id number]   COLONOSCOPY PROCEDURE REPORT  PATIENT: Jade Sullivan, Jade Sullivan  MR#: [id number] BIRTHDATE: [birth year] , 68  yrs. old GENDER: female ENDOSCOPIST: Jerene Bears, MD REFERRED IH:[id number] Asa Lente, M.D. PROCEDURE DATE:  12/22/2013 PROCEDURE:   Colonoscopy with cold biopsy polypectomy First Screening Colonoscopy - Avg.  risk and is 50 yrs.  old or older - No.  Prior Negative Screening - Now for repeat screening. 10 or more years since last screening  History of Adenoma - Now for follow-up colonoscopy & has been > or = to 3 yrs.  N/A  Polyps Removed Today? Yes. ASA CLASS:   Class III INDICATIONS:average risk for colon cancer and last colonoscopy 10 years ago in [place]. MEDICATIONS: Monitored anesthesia care and Propofol 270 mg  DESCRIPTION OF PROCEDURE:   After the risks benefits and alternatives of the procedure were thoroughly explained, informed consent was obtained.  The digital rectal exam revealed no rectal mass.   The LB [id number]-H190 [id number]  endoscope was introduced through the anus and advanced to the cecum, which was identified by both the appendix and ileocecal valve. No adverse events experienced. The quality of the prep was poor, using MoviPrep  The instrument was then slowly withdrawn as the colon was fully examined.   COLON FINDINGS: Poor preparation clearing to fair with copious irrigation and lavage.  Despite great effort some semi-solid stool and plant matter was unable to be cleared via the aspiration channel. A sessile polyp measuring 3 mm in size was found in the ascending colon.  A polypectomy was performed with cold forceps. The resection was complete, the polyp tissue was completely retrieved and sent to histology.   There was severe diverticulosis noted in the descending colon, sigmoid colon, ascending colon, and transverse colon with associated tortuosity.  Retroflexed views revealed internal Grade I  hemorrhoids. The time to cecum=10 minutes 43 seconds.  Withdrawal time=12 minutes 18 seconds.  The scope was withdrawn and the procedure completed.  COMPLICATIONS: There were no complications.  ENDOSCOPIC IMPRESSION: 1.   Sessile polyp measuring 3 mm in size was found in the ascending colon; polypectomy was performed with cold forceps 2.   There was severe diverticulosis noted in the descending colon, sigmoid colon, ascending colon, and transverse colon 3.   Less than ideal preparation as above  RECOMMENDATIONS: 1.  Await pathology results 2.  Repeat colonoscopy at a shorter interval due to preparation today, pending pathology results.  Next preparation 2 day. 3.  You will receive a letter within 1-2 weeks with the results of your biopsy as well as final recommendations.  Please call my office if you have not received a letter after 3 weeks.  eSigned:  Jerene Bears, MD 12/22/2013 5:07 PM   cc: The Patient and Rowe Clack, MD   PATIENT NAME:  Jade Sullivan, Jade Sullivan MR#: [id number]

## 2013-12-23 ENCOUNTER — Telehealth: Payer: Self-pay | Admitting: *Deleted

## 2013-12-23 NOTE — Telephone Encounter (Signed)
  Follow up Call-  Call back number 12/22/2013  Post procedure Call Back phone  # [phone]  Permission to leave phone message Yes     Patient questions:  Do you have a fever, pain , or abdominal swelling? No. Pain Score  0 *  Have you tolerated food without any problems? Yes.    Have you been able to return to your normal activities? Yes.    Do you have any questions about your discharge instructions: Diet   No. Medications  No. Follow up visit  No.  Do you have questions or concerns about your Care? No.  Actions: * If pain score is 4 or above: No action needed, pain <4.

## 2013-12-29 ENCOUNTER — Encounter: Payer: Self-pay | Admitting: Internal Medicine

## 2014-01-03 ENCOUNTER — Ambulatory Visit: Payer: Medicare Other | Admitting: Internal Medicine

## 2014-01-31 ENCOUNTER — Encounter: Payer: Self-pay | Admitting: Internal Medicine

## 2014-01-31 ENCOUNTER — Other Ambulatory Visit: Payer: Self-pay

## 2014-02-17 ENCOUNTER — Encounter: Payer: Self-pay | Admitting: Internal Medicine

## 2014-02-18 ENCOUNTER — Other Ambulatory Visit: Payer: Self-pay | Admitting: *Deleted

## 2014-02-18 DIAGNOSIS — E782 Mixed hyperlipidemia: Secondary | ICD-10-CM

## 2014-02-18 MED ORDER — ATORVASTATIN CALCIUM 20 MG PO TABS: 20.0000 mg | ORAL_TABLET | Freq: Every day | ORAL | Status: DC

## 2014-03-29 ENCOUNTER — Encounter: Payer: Self-pay | Admitting: Internal Medicine

## 2014-03-29 ENCOUNTER — Other Ambulatory Visit (INDEPENDENT_AMBULATORY_CARE_PROVIDER_SITE_OTHER): Payer: Medicare Other

## 2014-03-29 ENCOUNTER — Ambulatory Visit (INDEPENDENT_AMBULATORY_CARE_PROVIDER_SITE_OTHER): Payer: Medicare Other | Admitting: Internal Medicine

## 2014-03-29 ENCOUNTER — Ambulatory Visit: Payer: Medicare Other | Admitting: Internal Medicine

## 2014-03-29 VITALS — BP 165/110 | HR 79 | Temp 98.4°F | Ht 66.0 in | Wt 230.8 lb

## 2014-03-29 DIAGNOSIS — R739 Hyperglycemia, unspecified: Secondary | ICD-10-CM

## 2014-03-29 DIAGNOSIS — N181 Chronic kidney disease, stage 1: Secondary | ICD-10-CM

## 2014-03-29 DIAGNOSIS — N189 Chronic kidney disease, unspecified: Secondary | ICD-10-CM

## 2014-03-29 DIAGNOSIS — Z8639 Personal history of other endocrine, nutritional and metabolic disease: Secondary | ICD-10-CM

## 2014-03-29 DIAGNOSIS — N182 Chronic kidney disease, stage 2 (mild): Secondary | ICD-10-CM

## 2014-03-29 DIAGNOSIS — N184 Chronic kidney disease, stage 4 (severe): Secondary | ICD-10-CM

## 2014-03-29 DIAGNOSIS — I129 Hypertensive chronic kidney disease with stage 1 through stage 4 chronic kidney disease, or unspecified chronic kidney disease: Secondary | ICD-10-CM

## 2014-03-29 DIAGNOSIS — N185 Chronic kidney disease, stage 5: Secondary | ICD-10-CM

## 2014-03-29 DIAGNOSIS — N183 Chronic kidney disease, stage 3 (moderate): Secondary | ICD-10-CM

## 2014-03-29 LAB — BASIC METABOLIC PANEL
BUN: 15 mg/dL (ref 6–23)
CO2: 26 mEq/L (ref 19–32)
CREATININE: 1.1 mg/dL (ref 0.4–1.2)
Calcium: 9.8 mg/dL (ref 8.4–10.5)
Chloride: 110 mEq/L (ref 96–112)
GFR: 65.52 mL/min (ref >60.00)
Glucose, Bld: 124 mg/dL — ABNORMAL HIGH (ref 70–99)
Potassium: 3.8 mEq/L (ref 3.5–5.1)
Sodium: 142 mEq/L (ref 135–145)

## 2014-03-29 LAB — TSH: TSH: 4.95 u[IU]/mL — ABNORMAL HIGH (ref 0.35–4.50)

## 2014-03-29 LAB — HEMOGLOBIN A1C: Hgb A1c MFr Bld: 6.6 % — ABNORMAL HIGH (ref 4.6–6.5)

## 2014-03-29 NOTE — Assessment & Plan Note (Signed)
A1c

## 2014-03-29 NOTE — Patient Instructions (Addendum)
Your next office appointment will be determined based upon review of your pending labs . Those instructions will be transmitted to you through My Chart .   Plain Mucinex (NOT D) for thick secretions ;force NON dairy fluids .   Nasal cleansing in the shower as discussed with lather of mild shampoo.After 10 seconds wash off lather while  exhaling through nostrils. Make sure that all residual soap is removed to prevent irritation.  Flonase OR Nasacort AQ 1 spray in each nostril twice a day as needed. Use the "crossover" technique into opposite nostril spraying toward opposite ear @ 45 degree angle, not straight up into nostril.  Plain Allegra (NOT D )  160 daily , Loratidine 10 mg , OR Zyrtec 10 mg @ bedtime  as needed for itchy eyes & sneezing.

## 2014-03-29 NOTE — Assessment & Plan Note (Signed)
Consider rate controlling CCB if OK with Dr Moshe Cipro BMET

## 2014-03-29 NOTE — Progress Notes (Signed)
   Subjective:    Patient ID: Jade Sullivan, female    DOB: 1945-08-03, 68 y.o.   MRN: [id number]  HPI Her blood pressure has been elevated over the last 3 days. She's been in contact with her Nephrologist, Dr. Moshe Cipro by email.She is not on Hydralazine.  This is in the context of rhinitis symptoms associated with head congestion, sneezing, and watery eyes. She has TakenZyrtec with only partial response  She denies any symptoms of upper respiratory tract infection otherwise.  She has not been taking any decongestants to her knowledge  Her blood pressure previously was well controlled with amlodipine but this caused significant edema of her gums necessitating discontinuation  Her past history includes partial thyroidectomy for hyperthyroidism in the 1980s.  Hyperglycemia on random glucoses; DM in both parents.  Review of Systems   Chest pain, palpitations, tachycardia, exertional dyspnea, paroxysmal nocturnal dyspnea, claudication or edema are absent.  Frontal headache, facial pain , nasal purulence, dental pain, sore throat , otic pain or otic discharge denied. No fever , chills or sweats.     Objective:   Physical Exam Pertinent or positive findings include: There is prominence of the globes with frank proptosis, greater on the right than the left. There is no lid lag The fundi are poorly visualized but there are no definite hemorrhages or dramatic arteriolar changes There is slight injection of the sclera without evidence of conjunctivitis Dentition is fair to poor with missing teeth. 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 Clubbing of nailbeds is suggested  General appearance :adequately nourished; in no distress. Eyes: No conjunctival inflammation or scleral icterus is present. Oral exam: Lips and gums are healthy appearing.There is no  oropharyngeal erythema or exudate noted.  Heart:  Normal rate and regular rhythm. S1 and S2 normal without gallop, click, rub or other extra sounds  Lungs:Chest clear to auscultation; no wheezes, rhonchi,rales ,or rubs present.No increased work of breathing.  Abdomen: bowel sounds normal, soft and non-tender without masses, organomegaly or hernias noted.  No guarding or rebound.  Vascular : all pulses equal ; no bruits present. Skin:Warm & dry.  Intact without suspicious lesions or rashes ; no jaundice or tenting Lymphatic: No lymphadenopathy is noted about the head, neck, axilla            Assessment & Plan:  See Current Assessment & Plan in Problem List under specific Diagnosis

## 2014-03-29 NOTE — Assessment & Plan Note (Signed)
BMET 

## 2014-03-29 NOTE — Progress Notes (Signed)
Pre visit review using our clinic review tool, if applicable. No additional management support is needed unless otherwise documented below in the visit note. 

## 2014-03-29 NOTE — Assessment & Plan Note (Signed)
TSH 

## 2014-03-30 ENCOUNTER — Other Ambulatory Visit: Payer: Self-pay | Admitting: Internal Medicine

## 2014-03-30 DIAGNOSIS — R739 Hyperglycemia, unspecified: Secondary | ICD-10-CM

## 2014-03-30 DIAGNOSIS — R7989 Other specified abnormal findings of blood chemistry: Secondary | ICD-10-CM

## 2014-04-01 HISTORY — PX: POLYPECTOMY: SHX149

## 2014-04-01 HISTORY — PX: COLONOSCOPY: SHX174

## 2014-04-04 ENCOUNTER — Telehealth: Payer: Self-pay

## 2014-04-04 NOTE — Telephone Encounter (Signed)
Per Dr. Linna Darner, fax of [place] and labs sent to [facility] at [phone] (fax)  [phone] (phone)

## 2014-08-19 ENCOUNTER — Telehealth: Payer: Self-pay | Admitting: Internal Medicine

## 2014-08-19 DIAGNOSIS — E782 Mixed hyperlipidemia: Secondary | ICD-10-CM

## 2014-08-19 MED ORDER — ATORVASTATIN CALCIUM 20 MG PO TABS: 20.0000 mg | ORAL_TABLET | Freq: Every day | ORAL | Status: DC

## 2014-08-19 NOTE — Telephone Encounter (Signed)
Patient requesting refill for atorvastatin (LIPITOR) 20 MG tablet [[id number]. Pharmacy is Paediatric nurse on [address]

## 2014-09-14 ENCOUNTER — Ambulatory Visit: Payer: Self-pay | Admitting: Internal Medicine

## 2014-09-22 ENCOUNTER — Other Ambulatory Visit: Payer: Self-pay | Admitting: Internal Medicine

## 2014-09-26 ENCOUNTER — Telehealth: Payer: Self-pay | Admitting: Internal Medicine

## 2014-09-26 ENCOUNTER — Ambulatory Visit (INDEPENDENT_AMBULATORY_CARE_PROVIDER_SITE_OTHER): Payer: Medicare Other | Admitting: Internal Medicine

## 2014-09-26 ENCOUNTER — Encounter: Payer: Self-pay | Admitting: Internal Medicine

## 2014-09-26 ENCOUNTER — Other Ambulatory Visit (INDEPENDENT_AMBULATORY_CARE_PROVIDER_SITE_OTHER): Payer: Medicare Other

## 2014-09-26 VITALS — HR 84 | Temp 98.3°F | Resp 14 | Wt 241.0 lb

## 2014-09-26 DIAGNOSIS — R739 Hyperglycemia, unspecified: Secondary | ICD-10-CM

## 2014-09-26 DIAGNOSIS — H0520 Unspecified exophthalmos: Secondary | ICD-10-CM

## 2014-09-26 DIAGNOSIS — I129 Hypertensive chronic kidney disease with stage 1 through stage 4 chronic kidney disease, or unspecified chronic kidney disease: Secondary | ICD-10-CM

## 2014-09-26 DIAGNOSIS — E782 Mixed hyperlipidemia: Secondary | ICD-10-CM

## 2014-09-26 DIAGNOSIS — D1720 Benign lipomatous neoplasm of skin and subcutaneous tissue of unspecified limb: Secondary | ICD-10-CM

## 2014-09-26 DIAGNOSIS — N185 Chronic kidney disease, stage 5: Secondary | ICD-10-CM

## 2014-09-26 DIAGNOSIS — N181 Chronic kidney disease, stage 1: Secondary | ICD-10-CM

## 2014-09-26 DIAGNOSIS — Z8639 Personal history of other endocrine, nutritional and metabolic disease: Secondary | ICD-10-CM

## 2014-09-26 DIAGNOSIS — N184 Chronic kidney disease, stage 4 (severe): Secondary | ICD-10-CM

## 2014-09-26 DIAGNOSIS — N182 Chronic kidney disease, stage 2 (mild): Secondary | ICD-10-CM

## 2014-09-26 DIAGNOSIS — M170 Bilateral primary osteoarthritis of knee: Secondary | ICD-10-CM

## 2014-09-26 DIAGNOSIS — N189 Chronic kidney disease, unspecified: Secondary | ICD-10-CM

## 2014-09-26 DIAGNOSIS — N183 Chronic kidney disease, stage 3 (moderate): Secondary | ICD-10-CM

## 2014-09-26 DIAGNOSIS — D1721 Benign lipomatous neoplasm of skin and subcutaneous tissue of right arm: Secondary | ICD-10-CM

## 2014-09-26 LAB — T3, FREE: T3 FREE: 3.2 pg/mL (ref 2.3–4.2)

## 2014-09-26 LAB — T4, FREE: FREE T4: 0.56 ng/dL — AB (ref 0.60–1.60)

## 2014-09-26 LAB — LIPID PANEL
CHOLESTEROL: 152 mg/dL (ref 0–200)
HDL: 31.9 mg/dL — AB (ref >39.00)
LDL Cholesterol: 97 mg/dL (ref 0–99)
NONHDL: 120.1
Total CHOL/HDL Ratio: 5
Triglycerides: 116 mg/dL (ref 0.0–149.0)
VLDL: 23.2 mg/dL (ref 0.0–40.0)

## 2014-09-26 LAB — HEPATIC FUNCTION PANEL
ALK PHOS: 91 U/L (ref 39–117)
ALT: 14 U/L (ref 0–35)
AST: 21 U/L (ref 0–37)
Albumin: 4.3 g/dL (ref 3.5–5.2)
BILIRUBIN DIRECT: 0 mg/dL (ref 0.0–0.3)
BILIRUBIN TOTAL: 0.6 mg/dL (ref 0.2–1.2)
TOTAL PROTEIN: 8 g/dL (ref 6.0–8.3)

## 2014-09-26 LAB — TSH: TSH: 5.82 u[IU]/mL — AB (ref 0.35–4.50)

## 2014-09-26 LAB — HEMOGLOBIN A1C: HEMOGLOBIN A1C: 6.6 % — AB (ref 4.6–6.5)

## 2014-09-26 NOTE — Assessment & Plan Note (Signed)
Blood pressure goals reviewed. BMET as per Nephrology

## 2014-09-26 NOTE — Assessment & Plan Note (Signed)
F/U with Dr Moshe Cipro in August

## 2014-09-26 NOTE — Assessment & Plan Note (Signed)
Lipids, LFTs, TSH  

## 2014-09-26 NOTE — Assessment & Plan Note (Addendum)
TSH, free T4 ,free T3 Ophth referral

## 2014-09-26 NOTE — Patient Instructions (Addendum)
  Your next office appointment will be determined based upon review of your pending labs  and  xrays  Those written interpretation of the lab results and instructions will be transmitted to you by My Chart   Critical results will be called.   Followup as needed for any active or acute issue. Please report any significant change in your symptoms.  The Ophthalmology & General Surgery referrals will be scheduled and you'll be notified of the time.Please call the Referral Co-Ordinator @ [phone] if you have not been notified of appointment time within 7-10 days.  Consider glucosamine sulfate 1500 mg daily for joint symptoms. Take this daily  for 3 months and then leave it off for 2 months. This will rehydrate the cartilages.Use an anti-inflammatory cream such as Aspercreme or Zostrix cream twice a day to the affected area as needed. In lieu of this warm moist compresses or  hot water bottle can be used. Do not apply ice .

## 2014-09-26 NOTE — Progress Notes (Signed)
Pre visit review using our clinic review tool, if applicable. No additional management support is needed unless otherwise documented below in the visit note. 

## 2014-09-26 NOTE — Telephone Encounter (Signed)
Patient states Dr. Linna Darner told her off a medication that would help with arthritis that would not damage kidneys.  Patient has forgot.  She is requesting call back or a message sent to [facility].

## 2014-09-26 NOTE — Telephone Encounter (Signed)
Please advise 

## 2014-09-26 NOTE — Telephone Encounter (Signed)
  A new report indicates that glucosamine/chondroitin is very effective for arthritic pain . The only contraindication to this supplement would be a shellfish allergy.  Use an anti-inflammatory cream such as Aspercreme or Zostrix cream twice a day to the affected area as needed. In lieu of this warm moist compresses or  hot water bottle can be used. Do not apply ice .

## 2014-09-26 NOTE — Progress Notes (Signed)
   Subjective:    Patient ID: Jade Sullivan, female    DOB: 1945-12-28, 69 y.o.   MRN: [id number]  HPI The patient is here to assess status of active health conditions.  PMH, FH, & Social History reviewed & updated.   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 There is marked stiffness in the morning and after being seated for a period time. She's been taking Excedrin daily or twice a day almost every day. Her blood pressure has been well controlled, ranging from the 100s over 70s-140s over 80s.  She's been compliant with blood pressure medicines. She does take spironolactone 1/2 dose in the morning for systolic blood pressures less than 110 and decreases the clonidine from 3 times a day to twice a day based on serial blood pressures.  She has had some associated growth of facial hair and increased perspiration related to the Minoxidil. She is scheduled to see her Nephrologist in August. She was last seen in February.  She's been on a heart healthy low-sodium diet. She limits her concentrated sweets. She does not exercise but [facility] on 09/30/14.   She is concerned about a large growth on the right shoulder which has increased in size  Diabetes was present among her mother, father, sister, brother. All of these family members also had hypertension. Her mother and father had strokes.  Review of Systems  Chest pain, palpitations, tachycardia, exertional dyspnea, paroxysmal nocturnal dyspnea, claudication or edema are absent. No unexplained weight loss, abdominal pain, significant dyspepsia, dysphagia, melena, rectal bleeding, or persistently small caliber stools. Dysuria, pyuria, hematuria, frequency, nocturia or polyuria are denied. Change in hair, skin, nails denied other than facial hair changes. No bowel changes of constipation or diarrhea. No intolerance to heat or cold.      Objective:   Physical Exam  Pertinent or  positive findings include: There is a subtle head tremor. She has exophthalmus with marked scleritis. Eyes are markedly moist. Vision is intact to confrontation. The thyroid is asymmetric with the left lobe greater than the right. Abdomen is protuberant. Pedal pulses are decreased. She has crepitus of the knees. Degenerative reflexes are 0+ at the knees. She has a large lipoma of the right shoulder measuring 6 x 7 cm. General appearance :adequately nourished; in no distress.  Eyes: No conjunctival inflammation or scleral icterus is present.  Oral exam:  Lips and gums are healthy appearing.There is no oropharyngeal erythema or exudate noted. Dental hygiene is good.  Heart:  Normal rate and regular rhythm. S1 and S2 normal without gallop, murmur, click, rub or other extra sounds    Lungs:Chest clear to auscultation; no wheezes, rhonchi,rales ,or rubs present.No increased work of breathing.   Abdomen: bowel sounds normal, soft and non-tender without masses, organomegaly or hernias noted.  No guarding or rebound.   Vascular : all pulses equal ; no bruits present.  Skin:Warm & dry.  Intact without suspicious lesions or rashes ; no tenting   Lymphatic: No lymphadenopathy is noted about the head, neck, axilla   Neuro: Strength, tone normal.         Assessment & Plan:  See Current Assessment & Plan in Problem List under specific Diagnosis

## 2014-09-27 DIAGNOSIS — D1720 Benign lipomatous neoplasm of skin and subcutaneous tissue of unspecified limb: Secondary | ICD-10-CM | POA: Insufficient documentation

## 2014-09-27 NOTE — Assessment & Plan Note (Signed)
Surgery consult

## 2014-09-27 NOTE — Assessment & Plan Note (Signed)
Consider glucosamine sulfate 1500 mg daily for joint symptoms. Take this daily  for 3 months and then leave it off for 2 months. This will rehydrate the cartilages.  Use an anti-inflammatory cream such as Aspercreme or Zostrix cream twice a day to the affected area as needed. In lieu of this warm moist compresses or  hot water bottle can be used. Do not apply ice . 

## 2014-09-28 ENCOUNTER — Other Ambulatory Visit: Payer: Self-pay | Admitting: Internal Medicine

## 2014-09-28 DIAGNOSIS — E034 Atrophy of thyroid (acquired): Secondary | ICD-10-CM

## 2014-09-28 DIAGNOSIS — E039 Hypothyroidism, unspecified: Secondary | ICD-10-CM | POA: Insufficient documentation

## 2014-09-28 MED ORDER — LEVOTHYROXINE SODIUM 25 MCG PO TABS: 25.0000 ug | ORAL_TABLET | Freq: Every day | ORAL | Status: DC

## 2014-10-14 ENCOUNTER — Ambulatory Visit: Payer: Self-pay | Admitting: Surgery

## 2014-10-14 NOTE — H&P (Signed)
Jade Sullivan. Prevo 10/14/2014 9:42 AM Location: [facility] Patient #: [id number] DOB: March 29, 1946 Divorced / Language: Jade Sullivan / Race: Black or African American Female History of Present Illness [name] A. Krisy Dix MD; 10/14/2014 10:26 AM) Patient words: lipoma right shoulder  PT SENT AT THE REQUEST OF DR HOPPER FOR A LARGE MASS ON HER RIGHT SHOULDER. IT HAS BEEN PRESENT FOR 2 YEARS AND IS GETTING LARGER. NO REDNESS OR DRAINAGE. IT CAUSES MILD MODERATE DISCOMFORT. NO ROM ISSUES.  The patient is a 69 year old female   Other Problems Jade Sullivan, [place]; 10/14/2014 9:42 AM) Arthritis Diverticulosis Heart murmur Hemorrhoids High blood pressure Hypercholesterolemia Thyroid Disease Umbilical Hernia Repair  Past Surgical History Jade Sullivan, CMA; 10/14/2014 9:42 AM) Cesarean Section - 1 Colon Polyp Removal - Colonoscopy Colon Polyp Removal - Open Resection of Stomach Thyroid Surgery  Diagnostic Studies History Jade Sullivan, CMA; 10/14/2014 9:42 AM) Colonoscopy within last year Mammogram within last year  Allergies Jade Sullivan, CMA; 10/14/2014 9:43 AM) AmLODIPine Besylate *Calcium Channel Blockers**  Medication History (Jade Sullivan, CMA; 11/30/5174 1:60 AM) Bystolic (5MG  Tablet, Oral) Active. Atorvastatin Calcium (20MG  Tablet, Oral) Active. CloNIDine HCl (0.3MG /24HR Patch Weekly, Transdermal) Active. Losartan Potassium-HCTZ (100-25MG  Tablet, Oral) Active. Spironolactone (100MG  Tablet, Oral) Active. Medications Reconciled  Social History Jade Sullivan, CMA; 10/14/2014 9:42 AM) Alcohol use Remotely quit alcohol use. Caffeine use Tea. Illicit drug use Remotely quit drug use. Tobacco use Former smoker.  Family History Jade Sullivan, [place]; 10/14/2014 9:42 AM) Arthritis Mother. Cerebrovascular Accident Father, Mother. Diabetes Mellitus Brother, Father, Mother, Sister. Heart Disease Brother, Father. Heart disease in female family member before age  48 Hypertension Brother, Father, Mother, Sister, Son. Kidney Disease Brother, Sister. Respiratory Condition Father.  Pregnancy / Birth History Jade Sullivan, [place]; 10/14/2014 9:42 AM) Age at menarche 43 years. Age of menopause 51-55 Contraceptive History Oral contraceptives. Gravida 1 Maternal age 81-25 Para 1     Review of Systems ([place]; 10/14/2014 9:42 AM) General Not Present- Appetite Loss, Chills, Fatigue, Fever, Night Sweats, Weight Gain and Weight Loss. Skin Not Present- Change in Wart/Mole, Dryness, Hives, Jaundice, New Lesions, Non-Healing Wounds, Rash and Ulcer. HEENT Not Present- Earache, Hearing Loss, Hoarseness, Nose Bleed, Oral Ulcers, Ringing in the Ears, Seasonal Allergies, Sinus Pain, Sore Throat, Visual Disturbances, Wears glasses/contact lenses and Yellow Eyes. Respiratory Not Present- Bloody sputum, Chronic Cough, Difficulty Breathing, Snoring and Wheezing. Breast Not Present- Breast Mass, Breast Pain, Nipple Discharge and Skin Changes. Cardiovascular Not Present- Chest Pain, Difficulty Breathing Lying Down, Leg Cramps, Palpitations, Rapid Heart Rate, Shortness of Breath and Swelling of Extremities. Gastrointestinal Not Present- Abdominal Pain, Bloating, Bloody Stool, Change in Bowel Habits, Chronic diarrhea, Constipation, Difficulty Swallowing, Excessive gas, Gets full quickly at meals, Hemorrhoids, Indigestion, Nausea, Rectal Pain and Vomiting. Female Genitourinary Not Present- Frequency, Nocturia, Painful Urination, Pelvic Pain and Urgency. Musculoskeletal Present- Joint Stiffness. Not Present- Back Pain, Joint Pain, Muscle Pain, Muscle Weakness and Swelling of Extremities. Neurological Not Present- Decreased Memory, Fainting, Headaches, Numbness, Seizures, Tingling, Tremor, Trouble walking and Weakness. Psychiatric Not Present- Anxiety, Bipolar, Change in Sleep Pattern, Depression, Fearful and Frequent crying. Endocrine Not Present- Cold  Intolerance, Excessive Hunger, Hair Changes, Heat Intolerance, Hot flashes and New Diabetes. Hematology Not Present- Easy Bruising, Excessive bleeding, Gland problems, HIV and Persistent Infections.  Vitals (Jade Sullivan CMA; 10/14/2014 9:43 AM) 10/14/2014 9:42 AM Weight: 241.8 lb Height: 66in Body Surface Area: 2.26 m Body Mass Index: 39.03 kg/m Temp.: 8F(Temporal)  Pulse: 77 (Regular)  BP: 124/80 (Sitting, Left Arm,  Standard)     Physical Exam (Perl Kerney A. Allani Reber MD; 10/14/2014 10:28 AM)  General Mental Status-Alert. General Appearance-Consistent with stated age. Hydration-Well hydrated. Voice-Normal.  Integumentary Note: 10 CM MOBILE FATTY MASS RIGHT ANTERIOR SHOULDER NON PULSITILE ROM NORMAL   Head and Neck Head-normocephalic, atraumatic with no lesions or palpable masses. Trachea-midline. Thyroid Gland Characteristics - normal size and consistency.  Chest and Lung Exam Chest and lung exam reveals -quiet, even and easy respiratory effort with no use of accessory muscles and on auscultation, normal breath sounds, no adventitious sounds and normal vocal resonance. Inspection Chest Wall - Normal. Back - normal.  Cardiovascular Cardiovascular examination reveals -normal heart sounds, regular rate and rhythm with no murmurs and normal pedal pulses bilaterally.  Neurologic Neurologic evaluation reveals -alert and oriented x 3 with no impairment of recent or remote memory. Mental Status-Normal.  Musculoskeletal     Assessment & Plan ([name] A. 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 rISK OF BLEEDING , INFECTION, NERVE INJURY WITH LOSS OF FUNCTION, BLOOD VESSEL INJURY, SWELLING PAIN AND THE NEED FOR OTHER SURGERY DISCUSSED. SHE AGREES TO PROCEED.  Current Plans Pt Education - [facility] Free Text  Education/Instructions: discussed with patient and provided information.

## 2014-10-19 ENCOUNTER — Encounter: Payer: Self-pay | Admitting: Internal Medicine

## 2014-10-21 ENCOUNTER — Other Ambulatory Visit: Payer: Self-pay | Admitting: Internal Medicine

## 2014-10-21 ENCOUNTER — Encounter: Payer: Self-pay | Admitting: Internal Medicine

## 2014-10-21 NOTE — Telephone Encounter (Signed)
Pt has seen [facility] but the meds that are requested for refills are prescribed by [facility]. Please advise.

## 2014-10-21 NOTE — Telephone Encounter (Signed)
Patients pharmacy did not receive meds yet.  Patient would like to know if scripts will get sent today.  Patient would also like a call in regards to levothyroxine.  States pharmacy does not have a script for this.  Patient states she seen this on her med list but does not know why it was put there.  Patient can be reached at [phone].

## 2014-10-22 ENCOUNTER — Other Ambulatory Visit: Payer: Self-pay | Admitting: Internal Medicine

## 2014-10-24 ENCOUNTER — Telehealth: Payer: Self-pay | Admitting: Internal Medicine

## 2014-10-24 ENCOUNTER — Other Ambulatory Visit: Payer: Self-pay | Admitting: Internal Medicine

## 2014-10-24 NOTE — Telephone Encounter (Signed)
Please advise, Asa Lente pt but saw you 6/16.

## 2014-10-24 NOTE — Telephone Encounter (Signed)
Patient stated that she went to the pharmacy [facility] [address]), said Dr Huey Bienenstock did not signed off on it, medications  cloNIDine (CATAPRES - DOSED IN MG/24 HR) 0.3 mg/24hr patch  cloNIDine (CATAPRES) 0.2 MG tablet       spironolactone (ALDACTONE) 100 MG tablet, please advise

## 2014-10-24 NOTE — Telephone Encounter (Signed)
Mateo Flow, please help!  She is asking for clonidine patch as well as clonidine pills. Additionally she is on an ARB/HCTZ and high-dose spironolactone. The problem list includes chronic kidney disease. She has preop labs ordered as of 7/15; these have not been completed. I do not feel comfortable prescribing the spironolactone without seeing updated renal function. Last BMET was 03/29/14. Apparently she is followed by Nephrology

## 2014-10-24 NOTE — Telephone Encounter (Signed)
Spoke to pt and explained the reasoning behind the rx not being filled. Pt stated that she will call another provider and have them fill it. I explained that the labs are important and need to be completed before those medications can be continued.   Called pharmacy and explained the same.

## 2014-10-25 ENCOUNTER — Other Ambulatory Visit: Payer: Self-pay | Admitting: Internal Medicine

## 2014-10-25 DIAGNOSIS — E034 Atrophy of thyroid (acquired): Secondary | ICD-10-CM

## 2014-10-25 MED ORDER — LEVOTHYROXINE SODIUM 25 MCG PO TABS: 25.0000 ug | ORAL_TABLET | Freq: Every day | ORAL | Status: DC

## 2014-10-25 NOTE — Telephone Encounter (Signed)
Thank you for the careful attention to medical management of this patient Nothing to add as patient will follow with another provider as noted or return for labs as requested

## 2014-11-03 ENCOUNTER — Ambulatory Visit (INDEPENDENT_AMBULATORY_CARE_PROVIDER_SITE_OTHER)
Admission: RE | Admit: 2014-11-03 | Discharge: 2014-11-03 | Disposition: A | Discharge status: Home / Self Care | Payer: Medicare Other | Source: Ambulatory Visit | Attending: Internal Medicine | Admitting: Internal Medicine

## 2014-11-03 ENCOUNTER — Ambulatory Visit (INDEPENDENT_AMBULATORY_CARE_PROVIDER_SITE_OTHER): Payer: Medicare Other | Admitting: Internal Medicine

## 2014-11-03 ENCOUNTER — Other Ambulatory Visit (INDEPENDENT_AMBULATORY_CARE_PROVIDER_SITE_OTHER): Payer: Medicare Other

## 2014-11-03 ENCOUNTER — Encounter: Payer: Self-pay | Admitting: Internal Medicine

## 2014-11-03 VITALS — BP 142/84 | HR 76 | Temp 98.2°F | Resp 18 | Wt 242.0 lb

## 2014-11-03 DIAGNOSIS — G8929 Other chronic pain: Secondary | ICD-10-CM

## 2014-11-03 DIAGNOSIS — M1711 Unilateral primary osteoarthritis, right knee: Secondary | ICD-10-CM

## 2014-11-03 DIAGNOSIS — N184 Chronic kidney disease, stage 4 (severe): Secondary | ICD-10-CM | POA: Diagnosis not present

## 2014-11-03 DIAGNOSIS — Z8739 Personal history of other diseases of the musculoskeletal system and connective tissue: Secondary | ICD-10-CM | POA: Diagnosis not present

## 2014-11-03 DIAGNOSIS — N189 Chronic kidney disease, unspecified: Secondary | ICD-10-CM

## 2014-11-03 DIAGNOSIS — N181 Chronic kidney disease, stage 1: Secondary | ICD-10-CM

## 2014-11-03 DIAGNOSIS — I129 Hypertensive chronic kidney disease with stage 1 through stage 4 chronic kidney disease, or unspecified chronic kidney disease: Secondary | ICD-10-CM

## 2014-11-03 DIAGNOSIS — M25561 Pain in right knee: Secondary | ICD-10-CM | POA: Diagnosis not present

## 2014-11-03 DIAGNOSIS — N183 Chronic kidney disease, stage 3 (moderate): Secondary | ICD-10-CM | POA: Diagnosis not present

## 2014-11-03 DIAGNOSIS — N182 Chronic kidney disease, stage 2 (mild): Secondary | ICD-10-CM

## 2014-11-03 DIAGNOSIS — N185 Chronic kidney disease, stage 5: Secondary | ICD-10-CM

## 2014-11-03 LAB — BASIC METABOLIC PANEL
BUN: 22 mg/dL (ref 6–23)
CHLORIDE: 105 meq/L (ref 96–112)
CO2: 30 mEq/L (ref 19–32)
Calcium: 10.2 mg/dL (ref 8.4–10.5)
Creatinine, Ser: 1.42 mg/dL — ABNORMAL HIGH (ref 0.40–1.20)
GFR: 47.18 mL/min — ABNORMAL LOW (ref >60.00)
Glucose, Bld: 123 mg/dL — ABNORMAL HIGH (ref 70–99)
Potassium: 4.2 mEq/L (ref 3.5–5.1)
SODIUM: 141 meq/L (ref 135–145)

## 2014-11-03 LAB — URIC ACID: Uric Acid, Serum: 8.6 mg/dL — ABNORMAL HIGH (ref 2.4–7.0)

## 2014-11-03 MED ORDER — LOSARTAN POTASSIUM 100 MG PO TABS: 100.0000 mg | ORAL_TABLET | Freq: Every day | ORAL | Status: DC

## 2014-11-03 MED ORDER — PREDNISONE 10 MG PO TABS: 10.0000 mg | ORAL_TABLET | Freq: Every day | ORAL | Status: DC

## 2014-11-03 NOTE — Progress Notes (Signed)
Pre visit review using our clinic review tool, if applicable. No additional management support is needed unless otherwise documented below in the visit note. 

## 2014-11-03 NOTE — Progress Notes (Signed)
   Subjective:    Patient ID: Jade Sullivan, female    DOB: [birth year], 69 y.o.   MRN: [id number]  HPI She describes pain in the right knee since June; glucosamine has been of some benefit. Significant stiffness recurred as of 7/26 with severe pain as of 7/30. Symptoms are worse with weightbearing and walking. Pain improves when supine. She's used Excedrin and Aspercream without benefit.  She has a history of gout in 2008. At that time she was taken off HCTZ. She did not realize that Hyzaar had this medication. She believes that her Nephrologist prescribed this for.  The last renal function tests on record here were 03/29/14. Creatinine was 1.1 and GFR 65.52. Despite a history of chronic renal disease she is on spironolactone 100 mg daily in addition to the HCTZ.  There is no uric acid on record.  On 09/26/14 her A1c was 6.6%.   Review of Systems  Chest pain, palpitations, tachycardia, exertional dyspnea, paroxysmal nocturnal dyspnea, claudication or edema are absent.  Gait  imbalance due to knee pain. There is no numbness, tingling, or weakness in extremities.   No loss of control of bladder or bowels. Radicular type pain absent.     Objective:   Physical Exam  Pertinent or positive findings include:  She has a resting tremor of her head. There is a large lipoma over the right shoulder. She has severe crepitus of both knees. I cannot appreciate an effusion. Pedal pulses are decreased.Gait is slow , deliberate and somewhat "rocking" side to side.  General appearance :adequately nourished; in no distress. BMI 39.08  Eyes: No conjunctival inflammation or scleral icterus is present.  Heart:  Normal rate and regular rhythm. S1 and S2 normal without gallop, murmur, click, rub or other extra sounds    Lungs:Chest clear to auscultation; no wheezes, rhonchi,rales ,or rubs present.No increased work of breathing.   Abdomen: bowel sounds normal, soft and non-tender without masses,  organomegaly or hernias noted.  No guarding or rebound. No flank tenderness to percussion.  Vascular : all pulses equal ; no bruits present.  Skin:Warm & dry.  Intact without suspicious lesions or rashes ; no tenting    Lymphatic: No lymphadenopathy is noted about the head, neck, axilla.   Neuro: Strength, tone normal.         Assessment & Plan:  #1 advanced, end-stage degenerative disease of the knee; rule out gout in the context of HCT Z therapy  #2 history of renal insufficiency yet she is on high-dose spironolactone  #3 history of gout. Allopurinol would be contraindicated if she does have renal insufficiency. Uric acid will be checked. Despite the A1c of 6.6%; a short course of prednisone will be prescribed.

## 2014-11-03 NOTE — Patient Instructions (Addendum)
Minimal Blood Pressure Goal= AVERAGE < 140/90;  Ideal is an AVERAGE < 135/85. This AVERAGE should be calculated from @ least 5-7 BP readings taken @ different times of day on different days of week. You should not respond to isolated BP readings , but rather the AVERAGE for that week .Please bring your  blood pressure cuff to office visits to verify that it is reliable.It  can also be checked against the blood pressure device at the pharmacy. Finger or wrist cuffs are not dependable; an arm cuff is.    If the blood pressure is not controlled with losartan 100 mg in place of the Hyzaar; increase Bystolic 5 mg to 2 pills daily for a total dose of 10 mg.    Your next office appointment will be determined based upon review of your pending labs  and  xrays  Those written interpretation of the lab results and instructions will be transmitted to you by My Chart   Critical results will be called.   Followup as needed for any active or acute issue. Please report any significant change in your symptoms.

## 2014-11-05 ENCOUNTER — Other Ambulatory Visit: Payer: Self-pay | Admitting: Internal Medicine

## 2014-11-05 DIAGNOSIS — I129 Hypertensive chronic kidney disease with stage 1 through stage 4 chronic kidney disease, or unspecified chronic kidney disease: Secondary | ICD-10-CM

## 2014-11-18 ENCOUNTER — Encounter (HOSPITAL_BASED_OUTPATIENT_CLINIC_OR_DEPARTMENT_OTHER): Payer: Self-pay | Admitting: *Deleted

## 2014-11-22 ENCOUNTER — Encounter (HOSPITAL_BASED_OUTPATIENT_CLINIC_OR_DEPARTMENT_OTHER)
Admission: RE | Admit: 2014-11-22 | Discharge: 2014-11-22 | Disposition: A | Discharge status: Home / Self Care | Payer: Medicare Other | Source: Ambulatory Visit | Attending: Surgery | Admitting: Surgery

## 2014-11-22 ENCOUNTER — Other Ambulatory Visit: Payer: Self-pay

## 2014-11-22 DIAGNOSIS — Z79899 Other long term (current) drug therapy: Secondary | ICD-10-CM | POA: Diagnosis not present

## 2014-11-22 DIAGNOSIS — Z6838 Body mass index (BMI) 38.0-38.9, adult: Secondary | ICD-10-CM | POA: Diagnosis not present

## 2014-11-22 DIAGNOSIS — I10 Essential (primary) hypertension: Secondary | ICD-10-CM | POA: Diagnosis not present

## 2014-11-22 DIAGNOSIS — D1739 Benign lipomatous neoplasm of skin and subcutaneous tissue of other sites: Secondary | ICD-10-CM | POA: Diagnosis present

## 2014-11-22 DIAGNOSIS — M1990 Unspecified osteoarthritis, unspecified site: Secondary | ICD-10-CM | POA: Diagnosis not present

## 2014-11-22 DIAGNOSIS — K5790 Diverticulosis of intestine, part unspecified, without perforation or abscess without bleeding: Secondary | ICD-10-CM | POA: Diagnosis not present

## 2014-11-22 DIAGNOSIS — Z87891 Personal history of nicotine dependence: Secondary | ICD-10-CM | POA: Diagnosis not present

## 2014-11-22 DIAGNOSIS — K649 Unspecified hemorrhoids: Secondary | ICD-10-CM | POA: Diagnosis not present

## 2014-11-22 DIAGNOSIS — D2111 Benign neoplasm of connective and other soft tissue of right upper limb, including shoulder: Secondary | ICD-10-CM | POA: Diagnosis not present

## 2014-11-22 DIAGNOSIS — E780 Pure hypercholesterolemia: Secondary | ICD-10-CM | POA: Diagnosis not present

## 2014-11-22 DIAGNOSIS — Z888 Allergy status to other drugs, medicaments and biological substances status: Secondary | ICD-10-CM | POA: Diagnosis not present

## 2014-11-22 DIAGNOSIS — Z86010 Personal history of colonic polyps: Secondary | ICD-10-CM | POA: Diagnosis not present

## 2014-11-22 DIAGNOSIS — E039 Hypothyroidism, unspecified: Secondary | ICD-10-CM | POA: Diagnosis not present

## 2014-11-22 LAB — CBC WITH DIFFERENTIAL/PLATELET
BASOS ABS: 0 10*3/uL (ref 0.0–0.1)
BASOS PCT: 0 % (ref 0–1)
EOS ABS: 0.2 10*3/uL (ref 0.0–0.7)
EOS PCT: 3 % (ref 0–5)
HCT: 39.8 % (ref 36.0–46.0)
Hemoglobin: 13.3 g/dL (ref 12.0–15.0)
Lymphocytes Relative: 30 % (ref 12–46)
Lymphs Abs: 1.8 10*3/uL (ref 0.7–4.0)
MCH: 30.8 pg (ref 26.0–34.0)
MCHC: 33.4 g/dL (ref 30.0–36.0)
MCV: 92.1 fL (ref 78.0–100.0)
MONO ABS: 0.5 10*3/uL (ref 0.1–1.0)
Monocytes Relative: 9 % (ref 3–12)
Neutro Abs: 3.6 10*3/uL (ref 1.7–7.7)
Neutrophils Relative %: 58 % (ref 43–77)
PLATELETS: 176 10*3/uL (ref 150–400)
RBC: 4.32 MIL/uL (ref 3.87–5.11)
RDW: 14.2 % (ref 11.5–15.5)
WBC: 6.1 10*3/uL (ref 4.0–10.5)

## 2014-11-22 LAB — COMPREHENSIVE METABOLIC PANEL
ALBUMIN: 3.7 g/dL (ref 3.5–5.0)
ALT: 15 U/L (ref 14–54)
AST: 20 U/L (ref 15–41)
Alkaline Phosphatase: 78 U/L (ref 38–126)
Anion gap: 9 (ref 5–15)
BUN: 11 mg/dL (ref 6–20)
CHLORIDE: 107 mmol/L (ref 101–111)
CO2: 27 mmol/L (ref 22–32)
Calcium: 9.8 mg/dL (ref 8.9–10.3)
Creatinine, Ser: 1.13 mg/dL — ABNORMAL HIGH (ref 0.44–1.00)
GFR calc Af Amer: 57 mL/min — ABNORMAL LOW (ref >60)
GFR calc non Af Amer: 49 mL/min — ABNORMAL LOW (ref >60)
GLUCOSE: 124 mg/dL — AB (ref 65–99)
POTASSIUM: 4.8 mmol/L (ref 3.5–5.1)
Sodium: 143 mmol/L (ref 135–145)
Total Bilirubin: 1 mg/dL (ref 0.3–1.2)
Total Protein: 7.2 g/dL (ref 6.5–8.1)

## 2014-11-22 NOTE — Progress Notes (Signed)
ekg reviewed by Dr [name] Robert no further orders ,    Also spoked to pt concerning increased bp, she has already called her private md and waiting for return call.

## 2014-11-24 ENCOUNTER — Encounter (HOSPITAL_BASED_OUTPATIENT_CLINIC_OR_DEPARTMENT_OTHER)
Admission: RE | Disposition: A | Discharge status: Home / Self Care | Payer: Self-pay | Source: Ambulatory Visit | Attending: Surgery

## 2014-11-24 ENCOUNTER — Encounter (HOSPITAL_BASED_OUTPATIENT_CLINIC_OR_DEPARTMENT_OTHER): Payer: Self-pay | Admitting: *Deleted

## 2014-11-24 ENCOUNTER — Ambulatory Visit (HOSPITAL_BASED_OUTPATIENT_CLINIC_OR_DEPARTMENT_OTHER): Payer: Medicare Other | Admitting: Anesthesiology

## 2014-11-24 ENCOUNTER — Ambulatory Visit (HOSPITAL_BASED_OUTPATIENT_CLINIC_OR_DEPARTMENT_OTHER)
Admission: RE | Admit: 2014-11-24 | Discharge: 2014-11-24 | Disposition: A | Discharge status: Home / Self Care | Payer: Medicare Other | Source: Ambulatory Visit | Attending: Surgery | Admitting: Surgery

## 2014-11-24 DIAGNOSIS — M1990 Unspecified osteoarthritis, unspecified site: Secondary | ICD-10-CM | POA: Insufficient documentation

## 2014-11-24 DIAGNOSIS — Z6838 Body mass index (BMI) 38.0-38.9, adult: Secondary | ICD-10-CM | POA: Insufficient documentation

## 2014-11-24 DIAGNOSIS — I10 Essential (primary) hypertension: Secondary | ICD-10-CM | POA: Insufficient documentation

## 2014-11-24 DIAGNOSIS — Z888 Allergy status to other drugs, medicaments and biological substances status: Secondary | ICD-10-CM | POA: Insufficient documentation

## 2014-11-24 DIAGNOSIS — K649 Unspecified hemorrhoids: Secondary | ICD-10-CM | POA: Insufficient documentation

## 2014-11-24 DIAGNOSIS — K5790 Diverticulosis of intestine, part unspecified, without perforation or abscess without bleeding: Secondary | ICD-10-CM | POA: Insufficient documentation

## 2014-11-24 DIAGNOSIS — Z87891 Personal history of nicotine dependence: Secondary | ICD-10-CM | POA: Insufficient documentation

## 2014-11-24 DIAGNOSIS — E780 Pure hypercholesterolemia: Secondary | ICD-10-CM | POA: Diagnosis not present

## 2014-11-24 DIAGNOSIS — Z86010 Personal history of colonic polyps: Secondary | ICD-10-CM | POA: Insufficient documentation

## 2014-11-24 DIAGNOSIS — Z79899 Other long term (current) drug therapy: Secondary | ICD-10-CM | POA: Insufficient documentation

## 2014-11-24 DIAGNOSIS — D2111 Benign neoplasm of connective and other soft tissue of right upper limb, including shoulder: Secondary | ICD-10-CM | POA: Diagnosis not present

## 2014-11-24 DIAGNOSIS — E039 Hypothyroidism, unspecified: Secondary | ICD-10-CM | POA: Diagnosis not present

## 2014-11-24 HISTORY — DX: Hypothyroidism, unspecified: E03.9

## 2014-11-24 HISTORY — DX: Gout, unspecified: M10.9

## 2014-11-24 HISTORY — PX: LIPOMA EXCISION: SHX5283

## 2014-11-24 SURGERY — EXCISION LIPOMA
Anesthesia: General | Site: Shoulder | Laterality: Right

## 2014-11-24 MED ORDER — MIDAZOLAM HCL 2 MG/2ML IJ SOLN
INTRAMUSCULAR | Status: AC
  Filled 2014-11-24: qty 2

## 2014-11-24 MED ORDER — FENTANYL CITRATE (PF) 100 MCG/2ML IJ SOLN
50.0000 ug | INTRAMUSCULAR | Status: DC | PRN
  Administered 2014-11-24: 50 ug via INTRAVENOUS

## 2014-11-24 MED ORDER — LACTATED RINGERS IV SOLN
INTRAVENOUS | Status: DC
  Administered 2014-11-24: 08:00:00 via INTRAVENOUS

## 2014-11-24 MED ORDER — PROMETHAZINE HCL 25 MG/ML IJ SOLN: 6.2500 mg | INTRAMUSCULAR | Status: DC | PRN

## 2014-11-24 MED ORDER — HYDROMORPHONE HCL 1 MG/ML IJ SOLN
0.2500 mg | INTRAMUSCULAR | Status: DC | PRN
  Administered 2014-11-24 (×2): 0.5 mg via INTRAVENOUS

## 2014-11-24 MED ORDER — PROPOFOL 10 MG/ML IV BOLUS
INTRAVENOUS | Status: DC | PRN
  Administered 2014-11-24: 200 mg via INTRAVENOUS

## 2014-11-24 MED ORDER — DEXAMETHASONE SODIUM PHOSPHATE 4 MG/ML IJ SOLN
INTRAMUSCULAR | Status: DC | PRN
  Administered 2014-11-24: 10 mg via INTRAVENOUS

## 2014-11-24 MED ORDER — CEFAZOLIN SODIUM-DEXTROSE 2-3 GM-% IV SOLR
INTRAVENOUS | Status: AC
  Filled 2014-11-24: qty 50

## 2014-11-24 MED ORDER — DEXTROSE 5 % IV SOLN
3.0000 g | INTRAVENOUS | Status: AC
  Administered 2014-11-24: 2 g via INTRAVENOUS

## 2014-11-24 MED ORDER — CHLORHEXIDINE GLUCONATE 4 % EX LIQD: 1.0000 "application " | Freq: Once | CUTANEOUS | Status: DC

## 2014-11-24 MED ORDER — HYDROCODONE-ACETAMINOPHEN 5-325 MG PO TABS: 1.0000 | ORAL_TABLET | Freq: Four times a day (QID) | ORAL | Status: DC | PRN

## 2014-11-24 MED ORDER — LIDOCAINE HCL (CARDIAC) 20 MG/ML IV SOLN
INTRAVENOUS | Status: DC | PRN
  Administered 2014-11-24: 50 mg via INTRAVENOUS

## 2014-11-24 MED ORDER — SCOPOLAMINE 1 MG/3DAYS TD PT72: 1.0000 | MEDICATED_PATCH | TRANSDERMAL | Status: DC

## 2014-11-24 MED ORDER — BUPIVACAINE-EPINEPHRINE 0.25% -1:200000 IJ SOLN
INTRAMUSCULAR | Status: DC | PRN
  Administered 2014-11-24: 10 mL

## 2014-11-24 MED ORDER — HYDROMORPHONE HCL 1 MG/ML IJ SOLN
INTRAMUSCULAR | Status: AC
  Filled 2014-11-24: qty 1

## 2014-11-24 MED ORDER — FENTANYL CITRATE (PF) 100 MCG/2ML IJ SOLN
INTRAMUSCULAR | Status: AC
  Filled 2014-11-24: qty 4

## 2014-11-24 MED ORDER — GLYCOPYRROLATE 0.2 MG/ML IJ SOLN: 0.2000 mg | Freq: Once | INTRAMUSCULAR | Status: DC | PRN

## 2014-11-24 MED ORDER — ONDANSETRON HCL 4 MG/2ML IJ SOLN
INTRAMUSCULAR | Status: DC | PRN
Start: 2014-11-24 — End: 2014-11-24
  Administered 2014-11-24: 4 mg via INTRAVENOUS

## 2014-11-24 MED ORDER — MIDAZOLAM HCL 2 MG/2ML IJ SOLN
1.0000 mg | INTRAMUSCULAR | Status: DC | PRN
  Administered 2014-11-24: 2 mg via INTRAVENOUS

## 2014-11-24 MED ORDER — SCOPOLAMINE 1 MG/3DAYS TD PT72: 1.0000 | MEDICATED_PATCH | Freq: Once | TRANSDERMAL | Status: DC | PRN

## 2014-11-24 SURGICAL SUPPLY — 43 items
BENZOIN TINCTURE PRP APPL 2/3 (GAUZE/BANDAGES/DRESSINGS) IMPLANT
BLADE SURG 10 STRL SS (BLADE) IMPLANT
BLADE SURG 15 STRL LF DISP TIS (BLADE) ×1 IMPLANT
BLADE SURG 15 STRL SS (BLADE) ×1
CANISTER SUCT 1200ML W/VALVE (MISCELLANEOUS) IMPLANT
CHLORAPREP W/TINT 26ML (MISCELLANEOUS) ×2 IMPLANT
COVER BACK TABLE 60X90IN (DRAPES) ×2 IMPLANT
COVER MAYO STAND STRL (DRAPES) ×2 IMPLANT
DECANTER SPIKE VIAL GLASS SM (MISCELLANEOUS) IMPLANT
DRAIN CHANNEL 19F RND (DRAIN) ×2 IMPLANT
DRAPE LAPAROTOMY 100X72 PEDS (DRAPES) ×2 IMPLANT
DRAPE UTILITY XL STRL (DRAPES) ×2 IMPLANT
ELECT COATED BLADE 2.86 ST (ELECTRODE) ×2 IMPLANT
ELECT REM PT RETURN 9FT ADLT (ELECTROSURGICAL) ×2
ELECTRODE REM PT RTRN 9FT ADLT (ELECTROSURGICAL) ×1 IMPLANT
EVACUATOR SILICONE 100CC (DRAIN) ×2 IMPLANT
GAUZE SPONGE 4X4 12PLY STRL (GAUZE/BANDAGES/DRESSINGS) ×2 IMPLANT
GLOVE BIOGEL PI IND STRL 7.0 (GLOVE) ×1 IMPLANT
GLOVE BIOGEL PI IND STRL 8 (GLOVE) ×1 IMPLANT
GLOVE BIOGEL PI INDICATOR 7.0 (GLOVE) ×1
GLOVE BIOGEL PI INDICATOR 8 (GLOVE) ×1
GLOVE ECLIPSE 6.5 STRL STRAW (GLOVE) ×2 IMPLANT
GLOVE ECLIPSE 8.0 STRL XLNG CF (GLOVE) ×2 IMPLANT
GOWN STRL REUS W/ TWL LRG LVL3 (GOWN DISPOSABLE) ×2 IMPLANT
GOWN STRL REUS W/TWL LRG LVL3 (GOWN DISPOSABLE) ×2
LIQUID BAND (GAUZE/BANDAGES/DRESSINGS) IMPLANT
NEEDLE HYPO 25X1 1.5 SAFETY (NEEDLE) ×2 IMPLANT
NS IRRIG 1000ML POUR BTL (IV SOLUTION) IMPLANT
PACK BASIN DAY SURGERY FS (CUSTOM PROCEDURE TRAY) ×2 IMPLANT
PENCIL BUTTON HOLSTER BLD 10FT (ELECTRODE) ×2 IMPLANT
SLEEVE SCD COMPRESS KNEE MED (MISCELLANEOUS) ×2 IMPLANT
SPONGE LAP 4X18 X RAY DECT (DISPOSABLE) IMPLANT
STAPLER VISISTAT 35W (STAPLE) IMPLANT
STRIP CLOSURE SKIN 1/2X4 (GAUZE/BANDAGES/DRESSINGS) IMPLANT
SUT ETHILON 2 0 FS 18 (SUTURE) ×2 IMPLANT
SUT MON AB 4-0 PC3 18 (SUTURE) ×2 IMPLANT
SUT VICRYL 3-0 CR8 SH (SUTURE) IMPLANT
SUT VICRYL AB 3 0 TIES (SUTURE) IMPLANT
SYR CONTROL 10ML LL (SYRINGE) ×2 IMPLANT
TOWEL OR 17X24 6PK STRL BLUE (TOWEL DISPOSABLE) ×4 IMPLANT
TOWEL OR NON WOVEN STRL DISP B (DISPOSABLE) ×2 IMPLANT
TUBE CONNECTING 20X1/4 (TUBING) IMPLANT
YANKAUER SUCT BULB TIP NO VENT (SUCTIONS) IMPLANT

## 2014-11-24 NOTE — Op Note (Signed)
Preoperative diagnosis: 10 cm x 15 cm lipoma right shoulder  Postoperative diagnosis: Same  Procedure: Excision of lipoma right shoulder  Surgeon: Erroll Luna M.D.  Anesthesia: LMA with 0.25% Sensorcaine local with epinephrine  EBL: Minimal  Specimen: Lipoma to pathology  Drain: 19 round French drain  Indications for procedure: The patient is a 69 year old female with a large lipoma overlying her right anterior shoulder region. This subcutaneous. She desires excision.The procedure has been discussed with the patient.  Alternative therapies have been discussed with the patient.  Operative risks include bleeding,  Infection, nerve injury with dysfunction,   Organ injury,  Nerve injury,  Blood vessel injury,  DVT,  Pulmonary embolism,  Death,  And possible reoperation.  Medical management risks include worsening of present situation.  The success of the procedure is 50 -90 % at treating patients symptoms.  The patient understands and agrees to proceed.  Description of procedure: The patient was met in the holding area and questions are answered. The right shoulder was marked as the correct side. She was taken back to the operating room placed upon the OR table. After induction of general anesthesia, right shoulder region was prepped and draped in a sterile fashion. Timeout was done. He received antibiotics. Longitudinal incision made over the lipoma. Dissection was carried down with cautery the subcutaneous fat and a large well-circumscribed lipoma which was anterior to the musculature of the shoulder was encountered. This shelled out quite easily with minimal effort. Cautery was used to dissect the lipoma away from anterior portion of the humeral head. This was superficial to this. This did not involve the joint space  or the musculature of the shoulder girdle. Hemostasis achieved. 19 round drain placed and secured with 2-0 nylon. Skin closed with 3-0 Vicryl and 4-0 Monocryl. All final counts  found to be correct. Liquid adhesive applied. All final counts correct. Patient awoke and  extubated taken to recovery in satisfactory condition.

## 2014-11-24 NOTE — Anesthesia Postprocedure Evaluation (Signed)
Anesthesia Post Note  Patient: Jade Sullivan  Procedure(s) Performed: Procedure(s) (LRB): EXCISION RIGHT SHOULDER LIPOMA (Right)  Anesthesia type: general  Patient location: PACU  Post pain: Pain level controlled  Post assessment: Patient's Cardiovascular Status Stable  Last Vitals:  Filed Vitals:   11/24/14 0945  BP: 97/57  Pulse: 62  Temp:   Resp: 19    Post vital signs: Reviewed and stable  Level of consciousness: sedated  Complications: No apparent anesthesia complications

## 2014-11-24 NOTE — Discharge Instructions (Signed)
Bulb Drain Home Care A bulb drain consists of a thin rubber tube and a soft, round bulb that creates a gentle suction. The rubber tube is placed in the area where you had surgery. A bulb is attached to the end of the tube that is outside the body. The bulb drain removes excess fluid that normally builds up in a surgical wound after surgery. The color and amount of fluid will vary. Immediately after surgery, the fluid is bright red and is a little thicker than water. It may gradually change to a yellow or pink color and become more thin and water-like. When the amount decreases to about 1 or 2 tbsp in 24 hours, your health care provider will usually remove it. DAILY CARE  Keep the bulb flat (compressed) at all times, except while emptying it. The flatness creates suction. You can flatten the bulb by squeezing it firmly in the middle and then closing the cap.  Keep sites where the tube enters the skin dry and covered with a bandage (dressing).  Secure the tube 1-2 in (2.5-5.1 cm) below the insertion sites to keep it from pulling on your stitches. The tube is stitched in place and will not slip out.  Secure the bulb as directed by your health care provider.  For the first 3 days after surgery, there usually is more fluid in the bulb. Empty the bulb whenever it becomes half full because the bulb does not create enough suction if it is too full. The bulb could also overflow. Write down how much fluid you remove each time you empty your drain. Add up the amount removed in 24 hours.  Empty the bulb at the same time every day once the amount of fluid decreases and you only need to empty it once a day. Write down the amounts and the 24-hour totals to give to your health care provider. This helps your health care provider know when the tubes can be removed. EMPTYING THE BULB DRAIN Before emptying the bulb, get a measuring cup, a piece of paper and a pen, and wash your hands.  Gently run your fingers down the  tube (stripping) to empty any drainage from the tubing into the bulb. This may need to be done several times a day to clear the tubing of clots and tissue.  Open the bulb cap to release suction, which causes it to inflate. Do not touch the inside of the cap.  Gently run your fingers down the tube (stripping) to empty any drainage from the tubing into the bulb.  Hold the cap out of the way, and pour fluid into the measuring cup.   Squeeze the bulb to provide suction.  Replace the cap.   Check the tape that holds the tube to your skin. If it is becoming loose, you can remove the loose piece of tape and apply a new one. Then, pin the bulb to your shirt.   Write down the amount of fluid you emptied out. Write down the date and each time you emptied your bulb drain. (If there are 2 bulbs, note the amount of drainage from each bulb and keep the totals separate. Your health care provider will want to know the total amounts for each drain and which tube is draining more.)   Flush the fluid down the toilet and wash your hands.   Call your health care provider once you have less than 2 tbsp of fluid collecting in the bulb drain every 24 hours. If  there is drainage around the tube site, change dressings and keep the area dry. Cleanse around tube with sterile saline and place dry gauze around site. This gauze should be changed when it is soiled. If it stays clean and unsoiled, it should still be changed daily.  SEEK MEDICAL CARE IF:  Your drainage has a bad smell or is cloudy.   You have a fever.   Your drainage is increasing instead of decreasing.   Your tube fell out.   You have redness or swelling around the tube site.   You have drainage from a surgical wound.   Your bulb drain will not stay flat after you empty it.  MAKE SURE YOU:   Understand these instructions.  Will watch your condition.  Will get help right away if you are not doing well or get worse. Document  Released: 03/15/2000 Document Revised: 08/02/2013 Document Reviewed: 08/21/2011 [facility] Patient Information 2015 [place], [place]. This information is not intended to replace advice given to you by your health care provider. Make sure you discuss any questions you have with your health care provider.   GENERAL SURGERY: POST OP INSTRUCTIONS  1. DIET: Follow a light bland diet the first 24 hours after arrival home, such as soup, liquids, crackers, etc.  Be sure to include lots of fluids daily.  Avoid fast food or heavy meals as your are more likely to get nauseated.   2. Take your usually prescribed home medications unless otherwise directed. 3. PAIN CONTROL: a. Pain is best controlled by a usual combination of three different methods TOGETHER: i. Ice/Heat ii. Over the counter pain medication iii. Prescription pain medication b. Most patients will experience some swelling and bruising around the incisions.  Ice packs or heating pads (30-60 minutes up to 6 times a day) will help. Use ice for the first few days to help decrease swelling and bruising, then switch to heat to help relax tight/sore spots and speed recovery.  Some people prefer to use ice alone, heat alone, alternating between ice & heat.  Experiment to what works for you.  Swelling and bruising can take several weeks to resolve.   c. It is helpful to take an over-the-counter pain medication regularly for the first few weeks.  Choose one of the following that works best for you: i. Naproxen (Aleve, etc)  Two 220mg  tabs twice a day ii. Ibuprofen (Advil, etc) Three 200mg  tabs four times a day (every meal & bedtime) iii. Acetaminophen (Tylenol, etc) 500-650mg  four times a day (every meal & bedtime) d. A  prescription for pain medication (such as oxycodone, hydrocodone, etc) should be given to you upon discharge.  Take your pain medication as prescribed.  i. If you are having problems/concerns with the prescription medicine (does not control  pain, nausea, vomiting, rash, itching, etc), please call us [phone] to see if we need to switch you to a different pain medicine that will work better for you and/or control your side effect better. ii. If you need a refill on your pain medication, please contact your pharmacy.  They will contact our office to request authorization. Prescriptions will not be filled after 5 pm or on week-ends. 4. Avoid getting constipated.  Between the surgery and the pain medications, it is common to experience some constipation.  Increasing fluid intake and taking a fiber supplement (such as Metamucil, Citrucel, FiberCon, MiraLax, etc) 1-2 times a day regularly will usually help prevent this problem from occurring.  A mild laxative (prune  juice, Milk of Magnesia, MiraLax, etc) should be taken according to package directions if there are no bowel movements after 48 hours.   5. Wash / shower every day.  You may shower over the dressings as they are waterproof.  Continue to shower over incision(s) after the dressing is off. 6. Remove your waterproof bandages 5 days after surgery.  You may leave the incision open to air.  You may have skin tapes (Steri Strips) covering the incision(s).  Leave them on until one week, then remove.  You may replace a dressing/Band-Aid to cover the incision for comfort if you wish.      7. ACTIVITIES as tolerated:   a. You may resume regular (light) daily activities beginning the next day--such as daily self-care, walking, climbing stairs--gradually increasing activities as tolerated.  If you can walk 30 minutes without difficulty, it is safe to try more intense activity such as jogging, treadmill, bicycling, low-impact aerobics, swimming, etc. b. Save the most intensive and strenuous activity for last such as sit-ups, heavy lifting, contact sports, etc  Refrain from any heavy lifting or straining until you are off narcotics for pain control.   c. DO NOT PUSH THROUGH PAIN.  Let pain be  your guide: If it hurts to do something, don't do it.  Pain is your body warning you to avoid that activity for another week until the pain goes down. d. You may drive when you are no longer taking prescription pain medication, you can comfortably wear a seatbelt, and you can safely maneuver your car and apply brakes. e. Dennis Bast may have sexual intercourse when it is comfortable.  8. FOLLOW UP in our office a. Please call [facility] at [phone] to set up an appointment to see your surgeon in the office for a follow-up appointment approximately 2-3 weeks after your surgery. b. Make sure that you call for this appointment the day you arrive home to insure a convenient appointment time. 9. IF YOU HAVE DISABILITY OR FAMILY LEAVE FORMS, BRING THEM TO THE OFFICE FOR PROCESSING.  DO NOT GIVE THEM TO YOUR DOCTOR.   WHEN TO CALL us [phone]: 1. Poor pain control 2. Reactions / problems with new medications (rash/itching, nausea, etc)  3. Fever over 101.5 F (38.5 C) 4. Worsening swelling or bruising 5. Continued bleeding from incision. 6. Increased pain, redness, or drainage from the incision 7. Difficulty breathing / swallowing   The clinic staff is available to answer your questions during regular business hours (8:30am-5pm).  Please dont hesitate to call and ask to speak to one of our nurses for clinical concerns.   If you have a medical emergency, go to the nearest emergency room or call 911.  A surgeon from [facility] is always on call at the [facility], [place], [place], [place], [place]  [address] ? MAIN: [phone] ? TOLL FREE: [phone] ?  FAX [phone] [email or web address]    Post Anesthesia Home Care Instructions  Activity: Get plenty of rest for the remainder of the day. A responsible adult should stay with you for 24 hours following the procedure.  For the next 24 hours, DO NOT: -Drive a  car -Paediatric nurse -Drink alcoholic beverages -Take any medication unless instructed by your physician -Make any legal decisions or sign important papers.  Meals: Start with liquid foods such as gelatin or soup. Progress to regular foods as tolerated. Avoid greasy, spicy, heavy foods. If nausea and/or  vomiting occur, drink only clear liquids until the nausea and/or vomiting subsides. Call your physician if vomiting continues.  Special Instructions/Symptoms: Your throat may feel dry or sore from the anesthesia or the breathing tube placed in your throat during surgery. If this causes discomfort, gargle with warm salt water. The discomfort should disappear within 24 hours.  If you had a scopolamine patch placed behind your ear for the management of post- operative nausea and/or vomiting:  1. The medication in the patch is effective for 72 hours, after which it should be removed.  Wrap patch in a tissue and discard in the trash. Wash hands thoroughly with soap and water. 2. You may remove the patch earlier than 72 hours if you experience unpleasant side effects which may include dry mouth, dizziness or visual disturbances. 3. Avoid touching the patch. Wash your hands with soap and water after contact with the patch.

## 2014-11-24 NOTE — H&P (Signed)
H&P   Jade Sullivan (MR# [id number])      H&P Info    Author Note Status Last Update User Last Update Date/Time   Erroll Luna, MD Signed Erroll Luna, MD 10/14/2014 10:28 AM    H&P    Expand All Collapse All   Jade Sullivan 10/14/2014 9:42 AM Location: [facility] Patient #: [id number] DOB: 06/12/1945 Divorced / Language: Cleophus Molt / Race: Black or African American Female History of Present Illness Jade Sullivan A. Sherman Donaldson MD; 10/14/2014 10:26 AM) Patient words: lipoma right shoulder  PT SENT AT THE REQUEST OF DR HOPPER FOR A LARGE MASS ON HER RIGHT SHOULDER. IT HAS BEEN PRESENT FOR 2 YEARS AND IS GETTING LARGER. NO REDNESS OR DRAINAGE. IT CAUSES MILD MODERATE DISCOMFORT. NO ROM ISSUES.  The patient is a 69 year old female   Other Problems Jade Sullivan, [place]; 10/14/2014 9:42 AM) Arthritis Diverticulosis Heart murmur Hemorrhoids High blood pressure Hypercholesterolemia Thyroid Disease Umbilical Hernia Repair  Past Surgical History Jade Sullivan, CMA; 10/14/2014 9:42 AM) Cesarean Section - 1 Colon Polyp Removal - Colonoscopy Colon Polyp Removal - Open Resection of Stomach Thyroid Surgery  Diagnostic Studies History Jade Sullivan, CMA; 10/14/2014 9:42 AM) Colonoscopy within last year Mammogram within last year  Allergies Jade Sullivan, CMA; 10/14/2014 9:43 AM) AmLODIPine Besylate *Calcium Channel Blockers**  Medication History (Jade Sullivan, CMA; 3/55/7322 0:25 AM) Bystolic (5MG  Tablet, Oral) Active. Atorvastatin Calcium (20MG  Tablet, Oral) Active. CloNIDine HCl (0.3MG /24HR Patch Weekly, Transdermal) Active. Losartan Potassium-HCTZ (100-25MG  Tablet, Oral) Active. Spironolactone (100MG  Tablet, Oral) Active. Medications Reconciled  Social History Jade Sullivan, CMA; 10/14/2014 9:42 AM) Alcohol use Remotely quit alcohol use. Caffeine use Tea. Illicit drug use Remotely quit drug use. Tobacco use Former smoker.  Family History Jade Sullivan, [place]; 10/14/2014 9:42 AM) Arthritis Mother. Cerebrovascular Accident Father, Mother. Diabetes Mellitus Brother, Father, Mother, Sister. Heart Disease Brother, Father. Heart disease in female family member before age 55 Hypertension Brother, Father, Mother, Sister, Son. Kidney Disease Brother, Sister. Respiratory Condition Father.  Pregnancy / Birth History Jade Sullivan, [place]; 10/14/2014 9:42 AM) Age at menarche 12 years. Age of menopause 51-55 Contraceptive History Oral contraceptives. Gravida 1 Maternal age 65-25 Para 1     Review of Systems (Jade Sullivan; 10/14/2014 9:42 AM) General Not Present- Appetite Loss, Chills, Fatigue, Fever, Night Sweats, Weight Gain and Weight Loss. Skin Not Present- Change in Wart/Mole, Dryness, Hives, Jaundice, New Lesions, Non-Healing Wounds, Rash and Ulcer. HEENT Not Present- Earache, Hearing Loss, Hoarseness, Nose Bleed, Oral Ulcers, Ringing in the Ears, Seasonal Allergies, Sinus Pain, Sore Throat, Visual Disturbances, Wears glasses/contact lenses and Yellow Eyes. Respiratory Not Present- Bloody sputum, Chronic Cough, Difficulty Breathing, Snoring and Wheezing. Breast Not Present- Breast Mass, Breast Pain, Nipple Discharge and Skin Changes. Cardiovascular Not Present- Chest Pain, Difficulty Breathing Lying Down, Leg Cramps, Palpitations, Rapid Heart Rate, Shortness of Breath and Swelling of Extremities. Gastrointestinal Not Present- Abdominal Pain, Bloating, Bloody Stool, Change in Bowel Habits, Chronic diarrhea, Constipation, Difficulty Swallowing, Excessive gas, Gets full quickly at meals, Hemorrhoids, Indigestion, Nausea, Rectal Pain and Vomiting. Female Genitourinary Not Present- Frequency, Nocturia, Painful Urination, Pelvic Pain and Urgency. Musculoskeletal Present- Joint Stiffness. Not Present- Back Pain, Joint Pain, Muscle Pain, Muscle Weakness and Swelling of Extremities. Neurological Not Present- Decreased Memory, Fainting,  Headaches, Numbness, Seizures, Tingling, Tremor, Trouble walking and Weakness. Psychiatric Not Present- Anxiety, Bipolar, Change in Sleep Pattern, Depression, Fearful and Frequent crying. Endocrine Not Present- Cold Intolerance, Excessive Hunger, Hair Changes, Heat Intolerance, Hot flashes and New  Diabetes. Hematology Not Present- Easy Bruising, Excessive bleeding, Gland problems, HIV and Persistent Infections.  Vitals (Jade Sullivan CMA; 10/14/2014 9:43 AM) 10/14/2014 9:42 AM Weight: 241.8 lb Height: 66in Body Surface Area: 2.26 m Body Mass Index: 39.03 kg/m Temp.: 53F(Temporal)  Pulse: 77 (Regular)  BP: 124/80 (Sitting, Left Arm, Standard)     Physical Exam (Wandell Scullion A. Ronny Korff MD; 10/14/2014 10:28 AM)  General Mental Status-Alert. General Appearance-Consistent with stated age. Hydration-Well hydrated. Voice-Normal.  Integumentary Note: 10 CM MOBILE FATTY MASS RIGHT ANTERIOR SHOULDER NON PULSITILE ROM NORMAL   Head and Neck Head-normocephalic, atraumatic with no lesions or palpable masses. Trachea-midline. Thyroid Gland Characteristics - normal size and consistency.  Chest and Lung Exam Chest and lung exam reveals -quiet, even and easy respiratory effort with no use of accessory muscles and on auscultation, normal breath sounds, no adventitious sounds and normal vocal resonance. Inspection Chest Wall - Normal. Back - normal.  Cardiovascular Cardiovascular examination reveals -normal heart sounds, regular rate and rhythm with no murmurs and normal pedal pulses bilaterally.  Neurologic Neurologic evaluation reveals -alert and oriented x 3 with no impairment of recent or remote memory. Mental Status-Normal.  Musculoskeletal     Assessment & Plan (Milan Clare A. 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 RISK OF BLEEDING , INFECTION, NERVE INJURY WITH LOSS OF FUNCTION, BLOOD VESSEL INJURY, SWELLING PAIN AND THE NEED FOR OTHER SURGERY DISCUSSED. SHE AGREES TO PROCEED.  Current Plans Pt Education - [facility] Free Text Education/Instructions: discussed with patient and provided information.

## 2014-11-24 NOTE — Interval H&P Note (Signed)
History and Physical Interval Note:  11/24/2014 8:20 AM  Jade Sullivan  has presented today for surgery, with the diagnosis of Lipoma on Shoulder  The various methods of treatment have been discussed with the patient and family. After consideration of risks, benefits and other options for treatment, the patient has consented to  Procedure(s): EXCISION RIGHT SHOULDER LIPOMA (Right) as a surgical intervention .  The patient's history has been reviewed, patient examined, no change in status, stable for surgery.  I have reviewed the patient's chart and labs.  Questions were answered to the patient's satisfaction.     Madyn Ivins A.

## 2014-11-24 NOTE — Transfer of Care (Signed)
Immediate Anesthesia Transfer of Care Note  Patient: Jade Sullivan  Procedure(s) Performed: Procedure(s): EXCISION RIGHT SHOULDER LIPOMA (Right)  Patient Location: PACU  Anesthesia Type:General  Level of Consciousness: awake and alert   Airway & Oxygen Therapy: Patient Spontanous Breathing and Patient connected to face mask oxygen  Post-op Assessment: Report given to RN and Post -op Vital signs reviewed and stable  Post vital signs: Reviewed and stable  Last Vitals:  Filed Vitals:   11/24/14 0729  Pulse: 81  Temp: 37 C  Resp: 20    Complications: No apparent anesthesia complications

## 2014-11-24 NOTE — Anesthesia Preprocedure Evaluation (Addendum)
Anesthesia Evaluation  Patient identified by MRN, date of birth, ID band Patient awake    Reviewed: Allergy & Precautions, NPO status , Patient's Chart, lab work & pertinent test results  Airway Mallampati: II  TM Distance: >3 FB Neck ROM: Full    Dental  (+) Poor Dentition, Dental Advisory Given, Loose   Pulmonary former smoker,    Pulmonary exam normal       Cardiovascular hypertension, Pt. on medications Normal cardiovascular exam    Neuro/Psych negative neurological ROS  negative psych ROS   GI/Hepatic negative GI ROS, Neg liver ROS,   Endo/Other  Hypothyroidism Morbid obesity  Renal/GU Renal InsufficiencyRenal disease     Musculoskeletal   Abdominal   Peds  Hematology   Anesthesia Other Findings   Reproductive/Obstetrics                            Anesthesia Physical Anesthesia Plan  ASA: III  Anesthesia Plan: General   Post-op Pain Management:    Induction: Intravenous  Airway Management Planned: Oral ETT  Additional Equipment:   Intra-op Plan:   Post-operative Plan: Extubation in OR  Informed Consent: I have reviewed the patients History and Physical, chart, labs and discussed the procedure including the risks, benefits and alternatives for the proposed anesthesia with the patient or authorized representative who has indicated his/her understanding and acceptance.   Dental advisory given  Plan Discussed with: CRNA, Anesthesiologist and Surgeon  Anesthesia Plan Comments:        Anesthesia Quick Evaluation

## 2014-11-24 NOTE — Anesthesia Procedure Notes (Signed)
Procedure Name: LMA Insertion Date/Time: 11/24/2014 8:43 AM Performed by: Lieutenant Diego Pre-anesthesia Checklist: Patient identified, Emergency Drugs available, Suction available and Patient being monitored Patient Re-evaluated:Patient Re-evaluated prior to inductionOxygen Delivery Method: Circle System Utilized Preoxygenation: Pre-oxygenation with 100% oxygen Intubation Type: IV induction Ventilation: Mask ventilation without difficulty LMA: LMA inserted LMA Size: 4.0 Number of attempts: 1 Airway Equipment and Method: Bite block Placement Confirmation: positive ETCO2 and breath sounds checked- equal and bilateral Tube secured with: Tape Dental Injury: Teeth and Oropharynx as per pre-operative assessment

## 2014-11-25 ENCOUNTER — Encounter (HOSPITAL_BASED_OUTPATIENT_CLINIC_OR_DEPARTMENT_OTHER): Payer: Self-pay | Admitting: Surgery

## 2014-11-27 ENCOUNTER — Encounter: Payer: Self-pay | Admitting: Internal Medicine

## 2015-01-02 ENCOUNTER — Encounter: Payer: Self-pay | Admitting: Internal Medicine

## 2015-01-27 ENCOUNTER — Encounter: Payer: Self-pay | Admitting: Gynecology

## 2015-01-27 ENCOUNTER — Ambulatory Visit (INDEPENDENT_AMBULATORY_CARE_PROVIDER_SITE_OTHER): Payer: Medicare Other | Admitting: Gynecology

## 2015-01-27 ENCOUNTER — Other Ambulatory Visit: Payer: Self-pay | Admitting: Internal Medicine

## 2015-01-27 ENCOUNTER — Other Ambulatory Visit (HOSPITAL_COMMUNITY)
Admission: RE | Admit: 2015-01-27 | Discharge: 2015-01-27 | Disposition: A | Discharge status: Home / Self Care | Payer: Medicare Other | Source: Ambulatory Visit | Attending: Gynecology | Admitting: Gynecology

## 2015-01-27 VITALS — BP 138/86 | Ht 65.0 in | Wt 236.0 lb

## 2015-01-27 DIAGNOSIS — Z124 Encounter for screening for malignant neoplasm of cervix: Secondary | ICD-10-CM

## 2015-01-27 DIAGNOSIS — Z01419 Encounter for gynecological examination (general) (routine) without abnormal findings: Secondary | ICD-10-CM | POA: Diagnosis not present

## 2015-01-27 DIAGNOSIS — N889 Noninflammatory disorder of cervix uteri, unspecified: Secondary | ICD-10-CM | POA: Diagnosis not present

## 2015-01-27 NOTE — Progress Notes (Addendum)
Jade Sullivan 02/06/1946 [id number]   History:    69 y.o.  for annual gyn exam with no complaints today. Patient was seen for the first time as a new patient last year. Her PCP is Dr. Huey Bienenstock who is been treating her for her hypertension and hyperlipidemia as well as monitoring her chronic kidney disease. Patient also in 1982 had been diagnosed with Graves' disease and had a partial thyroidectomy and has established hypothyroidism for she's currently on medication. Patient reached the menopause at the age of 13 and has never been on any hormone replacement therapy. Her vasomotor symptoms and vaginal dryness is very nonspecific and very infrequent. Patient with no reported past history of any abnormal Pap smear. Patient had a colonoscopy in 2015 benign polyps removed. Patient had a normal bone density study here in our office in 2015.  Past medical history,surgical history, family history and social history were all reviewed and documented in the EPIC chart.  Gynecologic History No LMP recorded. Patient is postmenopausal. Contraception: post menopausal status Last Pap: 2015. Results were: normal Last mammogram: 2015. Results were: normal  Obstetric History OB History  Gravida Para Term Preterm AB SAB TAB Ectopic Multiple Living  1 1        1     # Outcome Date GA Lbr Len/2nd Weight Sex Delivery Anes PTL Lv  1 Para                ROS: A ROS was performed and pertinent positives and negatives are included in the history.  GENERAL: No fevers or chills. HEENT: No change in vision, no earache, sore throat or sinus congestion. NECK: No pain or stiffness. CARDIOVASCULAR: No chest pain or pressure. No palpitations. PULMONARY: No shortness of breath, cough or wheeze. GASTROINTESTINAL: No abdominal pain, nausea, vomiting or diarrhea, melena or bright red blood per rectum. GENITOURINARY: No urinary frequency, urgency, hesitancy or dysuria. MUSCULOSKELETAL: No joint or muscle pain, no back pain, no  recent trauma. DERMATOLOGIC: No rash, no itching, no lesions. ENDOCRINE: No polyuria, polydipsia, no heat or cold intolerance. No recent change in weight. HEMATOLOGICAL: No anemia or easy bruising or bleeding. NEUROLOGIC: No headache, seizures, numbness, tingling or weakness. PSYCHIATRIC: No depression, no loss of interest in normal activity or change in sleep pattern.     Exam: chaperone present  BP 138/86 mmHg  Ht 5\' 5"  (1.651 m)  Wt 236 lb (107.049 kg)  BMI 39.27 kg/m2  Body mass index is 39.27 kg/(m^2).  General appearance : Well developed well nourished female. No acute distress HEENT: Eyes: no retinal hemorrhage or exudates,  Neck supple, trachea midline, no carotid bruits, no thyroidmegaly Lungs: Clear to auscultation, no rhonchi or wheezes, or rib retractions  Heart: Regular rate and rhythm, no murmurs or gallops Breast:Examined in sitting and supine position were symmetrical in appearance, no palpable masses or tenderness,  no skin retraction, no nipple inversion, no nipple discharge, no skin discoloration, no axillary or supraclavicular lymphadenopathy Abdomen: no palpable masses or tenderness, no rebound or guarding Extremities: no edema or skin discoloration or tenderness  Pelvic:  Bartholin, Urethra, Skene Glands: Within normal limits             Vagina: No gross lesions or discharge  Cervix:                            Physical Exam  Genitourinary:  Uterus  anteverted, normal size, shape and consistency, non-tender and mobile  Adnexa  Without masses or tenderness  Anus and perineum  normal   Rectovaginal  normal sphincter tone without palpated masses or tenderness             Hemoccult PCP provides     Assessment/Plan:  69 y.o. female for annual exam was reminded that she is overdue for her mammogram. Requisition to schedule mammogram provided. Also a prescription for patient to receive her shingles vaccine at her local pharmacy was  provided. Pap smear was repeated today because of a small leukoplakic area at the ectocervix at the follow-up physician was noted. Patient will return back to the office in 2 weeks for follow-up colposcopy and by that we will have the results of the Pap smear. Patient was reminded to do her monthly breast exam. We discussed importance of calcium vitamin D and regular exercise for osteoporosis prevention.   Terrance Mass MD, 11:34 AM 01/27/2015

## 2015-01-27 NOTE — Patient Instructions (Addendum)
Shingles Vaccine: What You Need to Know WHAT IS SHINGLES?  Shingles is a painful skin rash, often with blisters. It is also called Herpes Zoster or just Zoster.  A shingles rash usually appears on one side of the face or body and lasts from 2 to 4 weeks. Its main symptom is pain, which can be quite severe. Other symptoms of shingles can include fever, headache, chills, and upset stomach. Very rarely, a shingles infection can lead to pneumonia, hearing problems, blindness, brain inflammation (encephalitis), or death.  For about 1 person in 5, severe pain can continue even after the rash clears up. This is called post-herpetic neuralgia.  Shingles is caused by the Varicella Zoster virus. This is the same virus that causes chickenpox. Only someone who has had a case of chickenpox or rarely, has gotten chickenpox vaccine, can get shingles. The virus stays in your body. It can reappear many years later to cause a case of shingles.  You cannot catch shingles from another person with shingles. However, a person who has never had chickenpox (or chickenpox vaccine) could get chickenpox from someone with shingles. This is not very common.  Shingles is far more common in people 15 and older than in younger people. It is also more common in people whose immune systems are weakened because of a disease such as cancer or drugs such as steroids or chemotherapy.  At least 1 million people get shingles per year in the [place]. SHINGLES VACCINE  A vaccine for shingles was licensed in 8657. In clinical trials, the vaccine reduced the risk of shingles by 50%. It can also reduce the pain in people who still get shingles after being vaccinated.  A single dose of shingles vaccine is recommended for adults 56 years of age and older. SOME PEOPLE SHOULD NOT GET SHINGLES VACCINE OR SHOULD WAIT A person should not get shingles vaccine if he or she:  Has ever had a life-threatening allergic reaction to gelatin, the  antibiotic neomycin, or any other component of shingles vaccine. Tell your caregiver if you have any severe allergies.  Has a weakened immune system because of current:  AIDS or another disease that affects the immune system.  Treatment with drugs that affect the immune system, such as prolonged use of high-dose steroids.  Cancer treatment, such as radiation or chemotherapy.  Cancer affecting the bone marrow or lymphatic system, such as leukemia or lymphoma.  Is pregnant, or might be pregnant. Women should not become pregnant until at least 4 weeks after getting shingles vaccine. Someone with a minor illness, such as a cold, may be vaccinated. Anyone with a moderate or severe acute illness should usually wait until he or she recovers before getting the vaccine. This includes anyone with a temperature of 101.3 F (38 C) or higher. WHAT ARE THE RISKS FROM SHINGLES VACCINE?  A vaccine, like any medicine, could possibly cause serious problems, such as severe allergic reactions. However, the risk of a vaccine causing serious harm, or death, is extremely small.  No serious problems have been identified with shingles vaccine. Mild Problems  Redness, soreness, swelling, or itching at the site of the injection (about 1 person in 3).  Headache (about 1 person in 5). Like all vaccines, shingles vaccine is being closely monitored for unusual or severe problems. WHAT IF THERE IS A MODERATE OR SEVERE REACTION? What should I look for? Any unusual condition, such as a severe allergic reaction or a high fever. If a severe allergic reaction  occurred, it would be within a few minutes to an hour after the shot. Signs of a serious allergic reaction can include difficulty breathing, weakness, hoarseness or wheezing, a fast heartbeat, hives, dizziness, paleness, or swelling of the throat. What should I do?  Call your caregiver, or get the person to a caregiver right away.  Tell the caregiver what  happened, the date and time it happened, and when the vaccination was given.  Ask the caregiver to report the reaction by filing a Vaccine Adverse Event Reporting System (VAERS) form. Or, you can file this report through the VAERS web site at www.vaers.SamedayNews.es or by calling 779-722-4605. VAERS does not provide medical advice. HOW CAN I LEARN MORE?  Ask your caregiver. He or she can give you the vaccine package insert or suggest other sources of information.  Contact the Centers for Disease Control and Prevention (CDC):  Call 818 625 6581 (1-800-CDC-INFO).  Visit the CDC website at http://hunter.com/ CDC Shingles Vaccine VIS (01/05/08)   This information is not intended to replace advice given to you by your health care provider. Make sure you discuss any questions you have with your health care provider.   Document Released: 01/13/2006 Document Revised: 08/02/2014 Document Reviewed: 07/08/2012 Elsevier Interactive Patient Education 2016 [place].  Colposcopy Colposcopy is a procedure to examine your cervix and vagina, or the area around the outside of your vagina, for abnormalities or signs of disease. The procedure is done using a lighted microscope called a colposcope. Tissue samples may be collected during the colposcopy if your health care provider finds any unusual cells. A colposcopy may be done if a woman has:  An abnormal Pap test. A Pap test is a medical test done to evaluate cells that are on the surface of the cervix.  A Pap test result that is suggestive of human papillomavirus (HPV). This virus can cause genital warts and is linked to the development of cervical cancer.  A sore on her cervix and the results of a Pap test were normal.  Genital warts on the cervix or in or around the outside of the vagina.  A mother who took the drug diethylstilbestrol (DES) while pregnant.  Painful intercourse.  Vaginal bleeding, especially after sexual intercourse. LET [facility] CARE PROVIDER KNOW ABOUT:  Any allergies you have.  All medicines you are taking, including vitamins, herbs, eye drops, creams, and over-the-counter medicines.  Previous problems you or members of your family have had with the use of anesthetics.  Any blood disorders you have.  Previous surgeries you have had.  Medical conditions you have. RISKS AND COMPLICATIONS Generally, a colposcopy is a safe procedure. However, as with any procedure, complications can occur. Possible complications include:  Bleeding.  Infection.  Missed lesions. BEFORE THE PROCEDURE   Tell your health care provider if you have your menstrual period. A colposcopy typically is not done during menstruation.  For 24 hours before the colposcopy, do not:  [place].  Use tampons.  Use medicines, creams, or suppositories in the vagina.  Have sexual intercourse. PROCEDURE  During the procedure, you will be lying on your back with your feet in foot rests (stirrups). A warm metal or plastic instrument (speculum) will be placed in your vagina to keep it open and to allow the health care provider to see the cervix. The colposcope will be placed outside the vagina. It will be used to magnify and examine the cervix, vagina, and the area around the outside of the vagina. A small amount  of liquid solution will be placed on the area that is to be viewed. This solution will make it easier to see the abnormal cells. Your health care provider will use tools to suck out mucus and cells from the canal of the cervix. Then he or she will record the location of the abnormal areas. If a biopsy is done during the procedure, a medicine will usually be given to numb the area (local anesthetic). You may feel mild pain or cramping while the biopsy is done. After the procedure, tissue samples collected during the biopsy will be sent to a lab for analysis. AFTER THE PROCEDURE  You will be given instructions on when to follow up with  your health care provider for your test results. It is important to keep your appointment.   This information is not intended to replace advice given to you by your health care provider. Make sure you discuss any questions you have with your health care provider.   Document Released: 06/08/2002 Document Revised: 11/18/2012 Document Reviewed: 10/15/2012 Elsevier Interactive Patient Education [employer].

## 2015-01-30 ENCOUNTER — Other Ambulatory Visit: Payer: Self-pay

## 2015-01-30 ENCOUNTER — Telehealth: Payer: Self-pay | Admitting: Emergency Medicine

## 2015-01-30 DIAGNOSIS — Z1231 Encounter for screening mammogram for malignant neoplasm of breast: Secondary | ICD-10-CM

## 2015-01-30 DIAGNOSIS — I129 Hypertensive chronic kidney disease with stage 1 through stage 4 chronic kidney disease, or unspecified chronic kidney disease: Secondary | ICD-10-CM

## 2015-01-30 MED ORDER — LOSARTAN POTASSIUM 100 MG PO TABS: 100.0000 mg | ORAL_TABLET | Freq: Every day | ORAL | Status: DC

## 2015-01-30 NOTE — Telephone Encounter (Signed)
Refill sent to pharm

## 2015-01-31 ENCOUNTER — Other Ambulatory Visit: Payer: Self-pay | Admitting: Internal Medicine

## 2015-02-02 LAB — CYTOLOGY - PAP

## 2015-02-07 ENCOUNTER — Ambulatory Visit (INDEPENDENT_AMBULATORY_CARE_PROVIDER_SITE_OTHER): Payer: Medicare Other | Admitting: Gynecology

## 2015-02-07 ENCOUNTER — Encounter: Payer: Self-pay | Admitting: Gynecology

## 2015-02-07 VITALS — BP 136/92

## 2015-02-07 DIAGNOSIS — N889 Noninflammatory disorder of cervix uteri, unspecified: Secondary | ICD-10-CM

## 2015-02-07 NOTE — Progress Notes (Signed)
   Patient is a 69 year old was seen in the office on October 20 for annual gynecological examination. See previous note for details. Incidental finding during pelvic exam had demonstrated a leukoplakic area at the 5:00 position of the ectocervix. Her Pap smear from that visit was normal with no evidence of dysplasia or any atypicality. She was asked to return today for detail colposcopic evaluation. The findings colposcopic as follows:  Genitourinary:    A detail colposcopic evaluation was undertaken whereby the external genitalia, perineum, perirectal region did not demonstrate any lesions. The speculum was introduced into the vagina and a systematic inspection of the vagina and cervix did not demonstrate any lesions with exception of an ulcerated area at the 5:00 position of the ectocervix. The transformation zone was minimally visualized. A biopsy was obtained from the 5:00 position of the ectocervix along with an ECC. Monsel solution was used for hemostasis.  Assessment/plan: Incidental finding at time of annual exam leukoplakic/slightly ulcerated area of the 5:00 position of the ectocervix for which a biopsy was obtained today along with an ECC. Pap smear few days prior was normal. Patient will be notify with results. If normal will follow-up in one year or when necessary.

## 2015-02-07 NOTE — Addendum Note (Signed)
Addended by: Thurnell Garbe A on: 02/07/2015 02:09 PM   Modules accepted: Orders

## 2015-02-14 ENCOUNTER — Ambulatory Visit
Admission: RE | Admit: 2015-02-14 | Discharge: 2015-02-14 | Disposition: A | Discharge status: Home / Self Care | Payer: Medicare Other | Source: Ambulatory Visit

## 2015-02-14 DIAGNOSIS — Z1231 Encounter for screening mammogram for malignant neoplasm of breast: Secondary | ICD-10-CM

## 2015-02-20 ENCOUNTER — Ambulatory Visit (AMBULATORY_SURGERY_CENTER): Payer: Self-pay | Admitting: *Deleted

## 2015-02-20 VITALS — Ht 66.0 in | Wt 242.6 lb

## 2015-02-20 DIAGNOSIS — Z86010 Personal history of colonic polyps: Secondary | ICD-10-CM

## 2015-02-20 MED ORDER — NA SULFATE-K SULFATE-MG SULF 17.5-3.13-1.6 GM/177ML PO SOLN: 1.0000 | Freq: Once | ORAL | Status: DC

## 2015-02-20 NOTE — Progress Notes (Signed)
No egg or soy allergy No issues with past sedation No diet pills No home 02 use   Last colon 11-2013 poor prep with Movi -prep. 2 day suprep and miralax /gatorade per Dr Hilarie Fredrickson.

## 2015-02-21 ENCOUNTER — Encounter: Payer: Self-pay | Admitting: Internal Medicine

## 2015-03-06 ENCOUNTER — Encounter: Payer: Self-pay | Admitting: Internal Medicine

## 2015-03-06 ENCOUNTER — Ambulatory Visit (AMBULATORY_SURGERY_CENTER): Payer: Medicare Other | Admitting: Internal Medicine

## 2015-03-06 VITALS — BP 124/69 | HR 52 | Temp 97.3°F | Resp 21 | Ht 66.0 in | Wt 242.0 lb

## 2015-03-06 DIAGNOSIS — Z86010 Personal history of colonic polyps: Secondary | ICD-10-CM

## 2015-03-06 DIAGNOSIS — D123 Benign neoplasm of transverse colon: Secondary | ICD-10-CM | POA: Diagnosis not present

## 2015-03-06 MED ORDER — SODIUM CHLORIDE 0.9 % IV SOLN: 500.0000 mL | INTRAVENOUS | Status: DC

## 2015-03-06 NOTE — Patient Instructions (Signed)
Discharge instructions given. Handouts on polyps and diverticulosis. Resume previous medications. YOU HAD AN ENDOSCOPIC PROCEDURE TODAY AT [facility]:   Refer to the procedure report that was given to you for any specific questions about what was found during the examination.  If the procedure report does not answer your questions, please call your gastroenterologist to clarify.  If you requested that your care partner not be given the details of your procedure findings, then the procedure report has been included in a sealed envelope for you to review at your convenience later.  YOU SHOULD EXPECT: Some feelings of bloating in the abdomen. Passage of more gas than usual.  Walking can help get rid of the air that was put into your GI tract during the procedure and reduce the bloating. If you had a lower endoscopy (such as a colonoscopy or flexible sigmoidoscopy) you may notice spotting of blood in your stool or on the toilet paper. If you underwent a bowel prep for your procedure, you may not have a normal bowel movement for a few days.  Please Note:  You might notice some irritation and congestion in your nose or some drainage.  This is from the oxygen used during your procedure.  There is no need for concern and it should clear up in a day or so.  SYMPTOMS TO REPORT IMMEDIATELY:   Following lower endoscopy (colonoscopy or flexible sigmoidoscopy):  Excessive amounts of blood in the stool  Significant tenderness or worsening of abdominal pains  Swelling of the abdomen that is new, acute  Fever of 100F or higher   Black, tarry-looking stools  For urgent or emergent issues, a gastroenterologist can be reached at any hour by calling [phone].   DIET: Your first meal following the procedure should be a small meal and then it is ok to progress to your normal diet. Heavy or fried foods are harder to digest and may make you feel nauseous or bloated.  Likewise, meals heavy in  dairy and vegetables can increase bloating.  Drink plenty of fluids but you should avoid alcoholic beverages for 24 hours.  ACTIVITY:  You should plan to take it easy for the rest of today and you should NOT DRIVE or use heavy machinery until tomorrow (because of the sedation medicines used during the test).    FOLLOW UP: Our staff will call the number listed on your records the next business day following your procedure to check on you and address any questions or concerns that you may have regarding the information given to you following your procedure. If we do not reach you, we will leave a message.  However, if you are feeling well and you are not experiencing any problems, there is no need to return our call.  We will assume that you have returned to your regular daily activities without incident.  If any biopsies were taken you will be contacted by phone or by letter within the next 1-3 weeks.  Please call us at [phone] if you have not heard about the biopsies in 3 weeks.    SIGNATURES/CONFIDENTIALITY: You and/or your care partner have signed paperwork which will be entered into your electronic medical record.  These signatures attest to the fact that that the information above on your After Visit Summary has been reviewed and is understood.  Full responsibility of the confidentiality of this discharge information lies with you and/or your care-partner.

## 2015-03-06 NOTE — Op Note (Signed)
[facility]  [address] [place], [address]   COLONOSCOPY PROCEDURE REPORT  PATIENT: Jade Sullivan, Jade Sullivan  MR#: [id number] BIRTHDATE: 1945/07/29 , 69  yrs. old GENDER: female ENDOSCOPIST: Jerene Bears, MD PROCEDURE DATE:  03/06/2015 PROCEDURE:   Colonoscopy, surveillance and Colonoscopy with snare polypectomy First Screening Colonoscopy - Avg.  risk and is 50 yrs.  old or older - No.  Prior Negative Screening - Now for repeat screening. N/A  History of Adenoma - Now for follow-up colonoscopy & has been > or = to 3 yrs.  No.  It has been less than 3 yrs since last colonoscopy.  Medical reason.  Polyps removed today? Yes ASA CLASS:   Class III INDICATIONS:Surveillance due to prior colonic neoplasia, PH Colon Adenoma (last colonoscopy 1 yr ago with poor prep), and FH Colon Adenoma. MEDICATIONS: Monitored anesthesia care and Propofol 300 mg IV  DESCRIPTION OF PROCEDURE:   After the risks benefits and alternatives of the procedure were thoroughly explained, informed consent was obtained.  The digital rectal exam revealed no rectal mass.   The LB [id number]  endoscope was introduced through the anus and advanced to the cecum, which was identified by both the appendix and ileocecal valve. No adverse events experienced. The quality of the prep was good.  (2 day Suprep was used)  The instrument was then slowly withdrawn as the colon was fully examined. Estimated blood loss is zero unless otherwise noted in this procedure report.   COLON FINDINGS: A sessile polyp measuring 5 mm in size was found at the hepatic flexure.  A polypectomy was performed with a cold snare.  The resection was complete, the polyp tissue was completely retrieved and sent to histology.   There was moderate diverticulosis noted in the ascending colon and sigmoid colon. Retroflexed views revealed internal hemorrhoids. The time to cecum = 5.3 Withdrawal time = 11.8   The scope was withdrawn and  the procedure completed. COMPLICATIONS: There were no immediate complications.  ENDOSCOPIC IMPRESSION: 1.   Sessile polyp was found at the hepatic flexure; polypectomy was performed with a cold snare 2.   Moderate diverticulosis was noted in the ascending colon and sigmoid colon  RECOMMENDATIONS: 1.  Await pathology results 2.  High fiber diet 3.  Repeat Colonoscopy in 5 years. 4.  You will receive a letter within 1-2 weeks with the results of your biopsy as well as final recommendations.  Please call my office if you have not received a letter after 3 weeks.  eSigned:  Jerene Bears, MD 03/06/2015 4:16 PM   cc: Rowe Clack, MD and The Patient

## 2015-03-06 NOTE — Progress Notes (Signed)
To recovery, report to McCoy, RN, VSS 

## 2015-03-06 NOTE — Progress Notes (Signed)
Called to room to assist during endoscopic procedure.  Patient ID and intended procedure confirmed with present staff. Received instructions for my participation in the procedure from the performing physician.  

## 2015-03-07 ENCOUNTER — Telehealth: Payer: Self-pay | Admitting: *Deleted

## 2015-03-07 NOTE — Telephone Encounter (Signed)
  Follow up Call-  Call back number 03/06/2015 12/22/2013  Post procedure Call Back phone  # [phone]  Permission to leave phone message Yes Yes     Patient questions:  Do you have a fever, pain , or abdominal swelling? No. Pain Score  0 *  Have you tolerated food without any problems? Yes.    Have you been able to return to your normal activities? No.  Do you have any questions about your discharge instructions: Diet   No. Medications  No. Follow up visit  No.  Do you have questions or concerns about your Care? No.  Actions: * If pain score is 4 or above: No action needed, pain <4.

## 2015-03-10 ENCOUNTER — Encounter: Payer: Self-pay | Admitting: Internal Medicine

## 2015-07-29 ENCOUNTER — Other Ambulatory Visit: Payer: Self-pay | Admitting: Internal Medicine

## 2015-08-24 ENCOUNTER — Other Ambulatory Visit (INDEPENDENT_AMBULATORY_CARE_PROVIDER_SITE_OTHER): Payer: Medicare Other

## 2015-08-24 ENCOUNTER — Ambulatory Visit (INDEPENDENT_AMBULATORY_CARE_PROVIDER_SITE_OTHER): Payer: Medicare Other | Admitting: Internal Medicine

## 2015-08-24 ENCOUNTER — Encounter: Payer: Self-pay | Admitting: Internal Medicine

## 2015-08-24 VITALS — BP 130/98 | HR 78 | Temp 98.8°F | Resp 16 | Ht 67.0 in | Wt 236.4 lb

## 2015-08-24 DIAGNOSIS — N183 Chronic kidney disease, stage 3 unspecified: Secondary | ICD-10-CM

## 2015-08-24 DIAGNOSIS — Z23 Encounter for immunization: Secondary | ICD-10-CM

## 2015-08-24 DIAGNOSIS — E8881 Metabolic syndrome: Secondary | ICD-10-CM

## 2015-08-24 DIAGNOSIS — E785 Hyperlipidemia, unspecified: Secondary | ICD-10-CM

## 2015-08-24 DIAGNOSIS — M170 Bilateral primary osteoarthritis of knee: Secondary | ICD-10-CM | POA: Diagnosis not present

## 2015-08-24 DIAGNOSIS — E039 Hypothyroidism, unspecified: Secondary | ICD-10-CM

## 2015-08-24 DIAGNOSIS — E782 Mixed hyperlipidemia: Secondary | ICD-10-CM

## 2015-08-24 DIAGNOSIS — I129 Hypertensive chronic kidney disease with stage 1 through stage 4 chronic kidney disease, or unspecified chronic kidney disease: Secondary | ICD-10-CM

## 2015-08-24 LAB — TSH: TSH: 4.48 u[IU]/mL (ref 0.35–4.50)

## 2015-08-24 LAB — LIPID PANEL
Cholesterol: 148 mg/dL (ref 0–200)
HDL: 30.1 mg/dL — AB (ref >39.00)
LDL Cholesterol: 95 mg/dL (ref 0–99)
NONHDL: 118.32
TRIGLYCERIDES: 118 mg/dL (ref 0.0–149.0)
Total CHOL/HDL Ratio: 5
VLDL: 23.6 mg/dL (ref 0.0–40.0)

## 2015-08-24 LAB — T4, FREE: FREE T4: 0.65 ng/dL (ref 0.60–1.60)

## 2015-08-24 LAB — HEMOGLOBIN A1C: Hgb A1c MFr Bld: 6.7 % — ABNORMAL HIGH (ref 4.6–6.5)

## 2015-08-24 MED ORDER — LOSARTAN POTASSIUM 100 MG PO TABS: 100.0000 mg | ORAL_TABLET | Freq: Every day | ORAL | Status: DC

## 2015-08-24 NOTE — Progress Notes (Signed)
Pre visit review using our clinic review tool, if applicable. No additional management support is needed unless otherwise documented below in the visit note. 

## 2015-08-24 NOTE — Patient Instructions (Addendum)
Tumeric is something to try for the arthritis. You can find it or ask the pharmacist for help.   We are checking the labs today and will call you with the results and may need to change the thyroid medicine.   Exercising to Stay Healthy Exercising regularly is important. It has many health benefits, such as:  Improving your overall fitness, flexibility, and endurance.  Increasing your bone density.  Helping with weight control.  Decreasing your body fat.  Increasing your muscle strength.  Reducing stress and tension.  Improving your overall health. In order to become healthy and stay healthy, it is recommended that you do moderate-intensity and vigorous-intensity exercise. You can tell that you are exercising at a moderate intensity if you have a higher heart rate and faster breathing, but you are still able to hold a conversation. You can tell that you are exercising at a vigorous intensity if you are breathing much harder and faster and cannot hold a conversation while exercising. HOW OFTEN SHOULD I EXERCISE? Choose an activity that you enjoy and set realistic goals. Your health care provider can help you to make an activity plan that works for you. Exercise regularly as directed by your health care provider. This may include:   Doing resistance training twice each week, such as:  Push-ups.  Sit-ups.  Lifting weights.  Using resistance bands.  Doing a given intensity of exercise for a given amount of time. Choose from these options:  150 minutes of moderate-intensity exercise every week.  75 minutes of vigorous-intensity exercise every week.  A mix of moderate-intensity and vigorous-intensity exercise every week. Children, pregnant women, people who are out of shape, people who are overweight, and older adults may need to consult a health care provider for individual recommendations. If you have any sort of medical condition, be sure to consult your health care provider  before starting a new exercise program.  WHAT ARE SOME EXERCISE IDEAS? Some moderate-intensity exercise ideas include:   Walking at a rate of 1 mile in 15 minutes.  Biking.  Hiking.  Golfing.  Dancing. Some vigorous-intensity exercise ideas include:   Walking at a rate of at least 4.5 miles per hour.  Jogging or running at a rate of 5 miles per hour.  Biking at a rate of at least 10 miles per hour.  Lap swimming.  Roller-skating or in-line skating.  Cross-country skiing.  Vigorous competitive sports, such as football, basketball, and soccer.  Jumping rope.  Aerobic dancing. WHAT ARE SOME EVERYDAY ACTIVITIES THAT CAN HELP ME TO GET EXERCISE?  Yard work, such as:  Psychologist, educational.  Raking and bagging leaves.  Washing and waxing your car.  Pushing a stroller.  Shoveling snow.  Gardening.  Washing windows or floors. HOW CAN I BE MORE ACTIVE IN MY DAY-TO-DAY ACTIVITIES?  Use the stairs instead of the elevator.  Take a walk during your lunch break.  If you drive, park your car farther away from work or school.  If you take public transportation, get off one stop early and walk the rest of the way.  Make all of your phone calls while standing up and walking around.  Get up, stretch, and walk around every 30 minutes throughout the day. WHAT GUIDELINES SHOULD I FOLLOW WHILE EXERCISING?  Do not exercise so much that you hurt yourself, feel dizzy, or get very short of breath.  Consult your health care provider before starting a new exercise program.  Wear comfortable clothes and shoes  with good support.  Drink plenty of water while you exercise to prevent dehydration or heat stroke. Body water is lost during exercise and must be replaced.  Work out until you breathe faster and your heart beats faster.   This information is not intended to replace advice given to you by your health care provider. Make sure you discuss any questions you have with your  health care provider.   Document Released: 04/20/2010 Document Revised: 04/08/2014 Document Reviewed: 08/19/2013 Elsevier Interactive Patient Education [employer].

## 2015-08-24 NOTE — Progress Notes (Signed)
   Subjective:    Patient ID: Jade Sullivan, female    DOB: [birth year], 70 y.o.   MRN: [id number]  HPI The patient is a 70 YO female coming in for establish with new provider. She is having problems with her thyroid (her kidney doctor did tests in March and told her she needed levels changed). She thought that this would be done but did not come in. She is taking synthroid 25 mcg daily without missing doses. She denies constipation or heat or cold intolerance. Does have some tiredness.  Also needs follow up on her other medical problems as she has not had care in some time. Please see A/P for status and treatment details.   Review of Systems  Constitutional: Positive for fatigue. Negative for fever, activity change, appetite change and unexpected weight change.  HENT: Negative.   Eyes: Negative.   Respiratory: Negative for cough, chest tightness and shortness of breath.   Cardiovascular: Negative for chest pain, palpitations and leg swelling.  Gastrointestinal: Negative.   Musculoskeletal: Positive for arthralgias. Negative for myalgias and gait problem.  Skin: Negative.   Neurological: Negative.   Psychiatric/Behavioral: Negative.       Objective:   Physical Exam  Constitutional: She is oriented to person, place, and time. She appears well-developed and well-nourished.  Overweight  HENT:  Head: Normocephalic and atraumatic.  Eyes: EOM are normal.  Neck: Normal range of motion.  Cardiovascular: Normal rate and regular rhythm.   Pulmonary/Chest: Effort normal and breath sounds normal. No respiratory distress. She has no wheezes.  Abdominal: Soft. Bowel sounds are normal. She exhibits no distension. There is no tenderness. There is no rebound.  Musculoskeletal: She exhibits no edema.  Neurological: She is alert and oriented to person, place, and time. Coordination normal.  Skin: Skin is warm and dry.  Psychiatric: She has a normal mood and affect.   Filed Vitals:   08/24/15 1419    BP: 158/100  Pulse: 78  Temp: 98.8 F (37.1 C)  TempSrc: Oral  Resp: 16  Height: 5\' 7"  (1.702 m)  Weight: 236 lb 6.4 oz (107.23 kg)  SpO2: 97%      Assessment & Plan:  Prevnar 13 given at visit.

## 2015-08-26 NOTE — Assessment & Plan Note (Signed)
Needs HgA1c as several elevated sugars in the past.

## 2015-08-26 NOTE — Assessment & Plan Note (Signed)
Follows with renal and likely related to hypertension. In the last several years her blood pressure medicines have been adjusted and she has had intermittent loss of control.

## 2015-08-26 NOTE — Assessment & Plan Note (Signed)
She is interested in pursuing knee injections or other treatments. Advised to schedule with sports medicine. She is allowed to take tylenol OTC which she is using for pain. Reviewed maximum dose.

## 2015-08-26 NOTE — Assessment & Plan Note (Signed)
Checking TSH and free T4, reviewed labs from March and TSH 8 but will recheck to see if still the case. Taking synthroid 25 mcg daily at this time. Adjust as needed.

## 2015-08-26 NOTE — Assessment & Plan Note (Signed)
Taking losartan, spironolactone, minodoxil, clonidine, bystolic and BP at goal today. Recent BMP from march reviewed and no adjustment needed.

## 2015-08-26 NOTE — Assessment & Plan Note (Signed)
Taking lipitor daily and checking lipid panel. Adjust as needed.

## 2015-08-29 ENCOUNTER — Telehealth: Payer: Self-pay

## 2015-08-29 DIAGNOSIS — Z23 Encounter for immunization: Secondary | ICD-10-CM | POA: Diagnosis not present

## 2015-08-29 NOTE — Telephone Encounter (Signed)
Patient had a missed call from Amy. I informed her of the lab results the reason Amy was calling. She did not have any of question at the time about it.

## 2015-08-30 ENCOUNTER — Telehealth: Payer: Self-pay

## 2015-08-30 ENCOUNTER — Other Ambulatory Visit: Payer: Self-pay | Admitting: Internal Medicine

## 2015-08-30 NOTE — Telephone Encounter (Signed)
levothyroxine (LEVOTHROID) 25 MCG tablet [id number]     Patient called and said there is no rx at the [facility]. She is now unsure if you wanted her to be taken it or up the mg or what. If she is going to take it she ask you send in the rx. Please advise or follow up.

## 2015-08-31 NOTE — Telephone Encounter (Signed)
Yes, should be levothyroxine 25 mcg daily.

## 2015-08-31 NOTE — Telephone Encounter (Signed)
Is patient supposed to be taking Levothyroxine 25 mg once daily?

## 2015-08-31 NOTE — Telephone Encounter (Signed)
Left message for patient to call back so I could clarify her questions about her thyroid medication. She should be taking Levothyroxine 25 mcg one tablet by mouth daily.

## 2015-09-04 NOTE — Telephone Encounter (Signed)
Patient aware of dosage instructions.

## 2015-09-12 ENCOUNTER — Ambulatory Visit (INDEPENDENT_AMBULATORY_CARE_PROVIDER_SITE_OTHER): Payer: Medicare Other | Admitting: Family Medicine

## 2015-09-12 ENCOUNTER — Encounter: Payer: Self-pay | Admitting: Family Medicine

## 2015-09-12 VITALS — BP 144/90 | HR 79 | Ht 67.0 in | Wt 239.0 lb

## 2015-09-12 DIAGNOSIS — M170 Bilateral primary osteoarthritis of knee: Secondary | ICD-10-CM | POA: Diagnosis not present

## 2015-09-12 NOTE — Progress Notes (Signed)
[facility] [place], [place] [address] Phone: [phone] Subjective:    I'm seeing this patient by the request  of:  Hoyt Koch, MD   CC: Bilateral knee pain  [id number] Jade Sullivan is a 70 y.o. female coming in with complaint of bilateral knee pain. Been giving her pain for approximately 1 decade. States that she is told she had arthritis in the knees before she moved her 4 years ago. Did have an injection approximately at that time that cause more harm and good she states. Patient states that now the pain in her knees is severe. Sometimes associated swelling. No radiation of pain. No numbness. Patient states though that it is affecting daily activities. Things such as going up or down stairs can be severely hardened cannot walk greater than 200 feet without stopping secondary to pain. Rates the severity of pain a 7 out of 10. Does respond somewhat to Excedrin. With reviewing patient's chart patient has had x-rays of the right knee that were independently visualized by me and shows severe bone-on-bone osteophytic changes tricompartmental.    Past Medical History  Diagnosis Date  . Hyperlipidemia   . Chronic kidney disease   . Diverticulosis of colon (without mention of hemorrhage)   . Dysmetabolic syndrome X   . H/O hyperthyroidism     By patient h/o hyperthyroidism with protosis, s/p subtotal thyroidectomy. On no replacement therapy. Last thyroid functions 2013 in [place]  Plan Will need thyroid functions Feb '15 with recommendations to follow.    . Hypertension     on multiple meds  . Hypothyroidism   . Arthritis     knees  . Gout   . Allergy     seasonal  . Heart murmur     yeras ago told had murmur   Past Surgical History  Procedure Laterality Date  . Cesarean section  1969  . Hernia repair  2000  . Thyroidectomy, partial  1981  . Lipoma excision Right 11/24/2014    Procedure: EXCISION RIGHT SHOULDER LIPOMA;   Surgeon: Erroll Luna, MD;  Location: [facility];  Service: General;  Laterality: Right;  . Colonoscopy    . Polypectomy     Social History   Social History  . Marital Status: Divorced    Spouse Name: N/A  . Number of Children: N/A  . Years of Education: N/A   Occupational History  . Retired    Social History Main Topics  . Smoking status: Former Research scientist (life sciences)  . Smokeless tobacco: Never Used  . Alcohol Use: No  . Drug Use: No  . Sexual Activity: No   Other Topics Concern  . None   Social History Narrative   Divorced   Retired - Engineer, maintenance (IT)   Allergies  Allergen Reactions  . Amlodipine     Gingival hyperplasia  . Hctz [Hydrochlorothiazide]     States caused her to have gout   Family History  Problem Relation Age of Onset  . Diabetes Mother     DM2  . Hypertension Mother   . Pneumonia Mother     died from this condition  . Colon polyps Mother   . Heart disease Father     died from this condition  . Colon polyps Father   . Kidney disease Sister     HBP and DM2; relative died from this condition  . Heart disease Brother     MI - HBP and DM2  .  Colon cancer Neg Hx   . Esophageal cancer Neg Hx   . Rectal cancer Neg Hx   . Stomach cancer Neg Hx     Past medical history, social, surgical and family history all reviewed in electronic medical record.  No pertanent information unless stated regarding to the chief complaint.   Review of Systems: No headache, visual changes, nausea, vomiting, diarrhea, constipation, dizziness, abdominal pain, skin rash, fevers, chills, night sweats, weight loss, swollen lymph nodes, body aches, joint swelling, muscle aches, chest pain, shortness of breath, mood changes.   Objective Blood pressure 144/90, pulse 79, height 5\' 7"  (1.702 m), weight 239 lb (108.41 kg), SpO2 98 %.  General: No apparent distress alert and oriented x3 mood and affect normal, dressed appropriately.  HEENT: Pupils equal, extraocular movements  intact  Respiratory: Patient's speak in full sentences and does not appear short of breath  Cardiovascular: No lower extremity edema, non tender, no erythema  Skin: Warm dry intact with no signs of infection or rash on extremities or on axial skeleton.  Abdomen: Soft nontender  Neuro: Cranial nerves II through XII are intact, neurovascularly intact in all extremities with 2+ DTRs and 2+ pulses.  Lymph: No lymphadenopathy of posterior or anterior cervical chain or axillae bilaterally.  Gait normal with good balance and coordination.  MSK:  Non tender with full range of motion and good stability and symmetric strength and tone of shoulders, elbows, wrist, hip, and ankles bilaterally. Mild arthritic changes of multiple joints. Knee: Bilateral Valgus deformity of the knees bilaterally. Significant thigh to calf ratio bilaterally Tender to palpation over the medial joint line right greater than left Right knee has some mild limitation in range of motion lacking the last 5 of extension and the last 10 of flexion. Instability noted with valgus force bilaterally painful patellar compression. Patellar glide with mild crepitus. Patellar and quadriceps tendons unremarkable. Hamstring and quadriceps strength is normal.   After informed written and verbal consent, patient was seated on exam table. Right knee was prepped with alcohol swab and utilizing anterolateral approach, patient's right knee space was injected with 4:1  marcaine 0.5%: Kenalog 40mg /dL. Patient tolerated the procedure well without immediate complications.  After informed written and verbal consent, patient was seated on exam table. Left knee was prepped with alcohol swab and utilizing anterolateral approach, patient's left knee space was injected with 4:1  marcaine 0.5%: Kenalog 40mg /dL. Patient tolerated the procedure well without immediate complications.     Impression and Recommendations:     This case required medical decision  making of moderate complexity.      Note: This dictation was prepared with Dragon dictation along with smaller phrase technology. Any transcriptional errors that result from this process are unintentional.

## 2015-09-12 NOTE — Assessment & Plan Note (Signed)
Patient was given bilateral injections today. Patient will be fitted with a stability brace for the right knee and then Coming weeks. We discussed topical anti-inflammatories and avoid oral 1 secondary to patient's comorbidities. We discussed icing regimen. Discussed proper shoes. Patient and will come back and see me again in 4 weeks. If continuing have pain she would be a candidate for viscous supplementation otherwise we can repeat steroid injections every 3-4 months if necessary.

## 2015-09-12 NOTE — Patient Instructions (Signed)
Good to see you  Ice 20 minutes 2 times daily. Usually after activity and before bed. We will get you a brace soon and they will be calling you.  We will start with the right knee and make sure you do well.  2 injections in the knees to help Keep doing the vitamins Add tart cherry extract at night any dose to help with the gout and the knee pain  pennsaid pinkie amount topically 2 times daily as needed.  Good shoes with rigid bottom.  Jalene Mullet, Merrell or New balance greater then 700 See me again in 4 weeks and if having more pain we will consider the orthovisc.

## 2015-09-12 NOTE — Progress Notes (Signed)
Pre visit review using our clinic review tool, if applicable. No additional management support is needed unless otherwise documented below in the visit note. 

## 2015-10-10 ENCOUNTER — Ambulatory Visit (INDEPENDENT_AMBULATORY_CARE_PROVIDER_SITE_OTHER): Payer: Medicare Other | Admitting: Family Medicine

## 2015-10-10 ENCOUNTER — Encounter: Payer: Self-pay | Admitting: Family Medicine

## 2015-10-10 VITALS — BP 130/84 | HR 81 | Ht 67.0 in | Wt 231.0 lb

## 2015-10-10 DIAGNOSIS — M170 Bilateral primary osteoarthritis of knee: Secondary | ICD-10-CM | POA: Diagnosis not present

## 2015-10-10 NOTE — Progress Notes (Signed)
Pre visit review using our clinic review tool, if applicable. No additional management support is needed unless otherwise documented below in the visit note. 

## 2015-10-10 NOTE — Patient Instructions (Signed)
Good to see you  You are doing great  Continue everything you are doing.  Ice is your friend Keep working on the weight.  The brace will be great  See me again in 2 months if we need to do the injections again.

## 2015-10-10 NOTE — Assessment & Plan Note (Signed)
Patient is doing very well one month after the injection. We discussed with patient about icing, continuing the topical anti-inflammatories as well as patient being fitted for custom brace secondary to the instability and patient's thigh to calf ratio. Patient will continue with this conservative therapy and we can repeat corticosteroid injections every 3 months if necessary or she would be a candidate for viscous supplementation. Follow-up in 2 months.  Spent  25 minutes with patient face-to-face and had greater than 50% of counseling including as described above in assessment and plan.

## 2015-10-10 NOTE — Progress Notes (Signed)
[name] [place], [place] [address] Phone: [phone] Subjective:    I'm seeing this patient by the request  of:  Hoyt Koch, MD   CC: Bilateral knee pain follow-up  [id number] Jade Sullivan is a 70 y.o. female coming in with complaint of bilateral knee pain. 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 Patient elected to try conservative therapy as well as an steroid injection. Patient states since she's had the injection she's been feeling 90% better. Still some mild dull aching pain but nothing that is severe. Still mild instability but nothing that makes her too concerned. Has been able to be more active and is attempting to lose weight. Patient is very happy with the results.    Past Medical History  Diagnosis Date  . Hyperlipidemia   . Chronic kidney disease   . Diverticulosis of colon (without mention of hemorrhage)   . Dysmetabolic syndrome X   . H/O hyperthyroidism     By patient h/o hyperthyroidism with protosis, s/p subtotal thyroidectomy. On no replacement therapy. Last thyroid functions 2013 in [place]  Plan Will need thyroid functions Feb '15 with recommendations to follow.    . Hypertension     on multiple meds  . Hypothyroidism   . Arthritis     knees  . Gout   . Allergy     seasonal  . Heart murmur     yeras ago told had murmur   Past Surgical History  Procedure Laterality Date  . Cesarean section  1969  . Hernia repair  2000  . Thyroidectomy, partial  1981  . Lipoma excision Right 11/24/2014    Procedure: EXCISION RIGHT SHOULDER LIPOMA;  Surgeon: Erroll Luna, MD;  Location: [facility];  Service: General;  Laterality: Right;  . Colonoscopy    . Polypectomy     Social History   Social History  . Marital Status: Divorced    Spouse Name: N/A  . Number of Children: N/A  . Years of Education: N/A   Occupational History  . Retired    Social  History Main Topics  . Smoking status: Former Research scientist (life sciences)  . Smokeless tobacco: Never Used  . Alcohol Use: No  . Drug Use: No  . Sexual Activity: No   Other Topics Concern  . None   Social History Narrative   Divorced   Retired - Engineer, maintenance (IT)   Allergies  Allergen Reactions  . Amlodipine     Gingival hyperplasia  . Hctz [Hydrochlorothiazide]     States caused her to have gout   Family History  Problem Relation Age of Onset  . Diabetes Mother     DM2  . Hypertension Mother   . Pneumonia Mother     died from this condition  . Colon polyps Mother   . Heart disease Father     died from this condition  . Colon polyps Father   . Kidney disease Sister     HBP and DM2; relative died from this condition  . Heart disease Brother     MI - HBP and DM2  . Colon cancer Neg Hx   . Esophageal cancer Neg Hx   . Rectal cancer Neg Hx   . Stomach cancer Neg Hx     Past medical history, social, surgical and family history all reviewed in electronic medical record.  No pertanent information unless stated regarding to the chief complaint.  Review of Systems: No headache, visual changes, nausea, vomiting, diarrhea, constipation, dizziness, abdominal pain, skin rash, fevers, chills, night sweats, weight loss, swollen lymph nodes, body aches, joint swelling, muscle aches, chest pain, shortness of breath, mood changes.   Objective Blood pressure 130/84, pulse 81, height 5\' 7"  (1.702 m), weight 231 lb (104.781 kg), SpO2 98 %.  General: No apparent distress alert and oriented x3 mood and affect normal, dressed appropriately.  HEENT: Pupils equal, extraocular movements intact  Respiratory: Patient's speak in full sentences and does not appear short of breath  Cardiovascular: No lower extremity edema, non tender, no erythema  Skin: Warm dry intact with no signs of infection or rash on extremities or on axial skeleton.  Abdomen: Soft nontender  Neuro: Cranial nerves II through XII are intact,  neurovascularly intact in all extremities with 2+ DTRs and 2+ pulses.  Lymph: No lymphadenopathy of posterior or anterior cervical chain or axillae bilaterally.  Gait normal with good balance and coordination.  MSK:  Non tender with full range of motion and good stability and symmetric strength and tone of shoulders, elbows, wrist, hip, and ankles bilaterally. Mild arthritic changes of multiple joints. Knee: Bilateral Valgus deformity of the knees bilaterally. Significant thigh to calf ratio bilaterally Significant less tenderness to palpation over the medial joint line Full range of motion which is an improvement. Instability noted with valgus force bilaterally still noted minimalpainful patellar compression. Patellar glide with mild crepitus. Patellar and quadriceps tendons unremarkable. Hamstring and quadriceps strength is normal.       Impression and Recommendations:     This case required medical decision making of moderate complexity.      Note: This dictation was prepared with Dragon dictation along with smaller phrase technology. Any transcriptional errors that result from this process are unintentional.

## 2015-11-15 ENCOUNTER — Other Ambulatory Visit: Payer: Self-pay | Admitting: Internal Medicine

## 2015-12-15 ENCOUNTER — Encounter: Payer: Self-pay | Admitting: Internal Medicine

## 2015-12-15 ENCOUNTER — Other Ambulatory Visit (INDEPENDENT_AMBULATORY_CARE_PROVIDER_SITE_OTHER): Payer: Medicare Other

## 2015-12-15 ENCOUNTER — Ambulatory Visit (INDEPENDENT_AMBULATORY_CARE_PROVIDER_SITE_OTHER): Payer: Medicare Other | Admitting: Internal Medicine

## 2015-12-15 VITALS — BP 128/90 | HR 65 | Temp 98.5°F | Ht 67.0 in | Wt 234.0 lb

## 2015-12-15 DIAGNOSIS — E039 Hypothyroidism, unspecified: Secondary | ICD-10-CM

## 2015-12-15 DIAGNOSIS — E034 Atrophy of thyroid (acquired): Secondary | ICD-10-CM

## 2015-12-15 DIAGNOSIS — E038 Other specified hypothyroidism: Secondary | ICD-10-CM

## 2015-12-15 DIAGNOSIS — R7301 Impaired fasting glucose: Secondary | ICD-10-CM

## 2015-12-15 DIAGNOSIS — E782 Mixed hyperlipidemia: Secondary | ICD-10-CM

## 2015-12-15 LAB — COMPREHENSIVE METABOLIC PANEL
ALBUMIN: 4 g/dL (ref 3.5–5.2)
ALK PHOS: 86 U/L (ref 39–117)
ALT: 13 U/L (ref 0–35)
AST: 15 U/L (ref 0–37)
BUN: 18 mg/dL (ref 6–23)
CO2: 28 mEq/L (ref 19–32)
CREATININE: 1.24 mg/dL — AB (ref 0.40–1.20)
Calcium: 9.6 mg/dL (ref 8.4–10.5)
Chloride: 108 mEq/L (ref 96–112)
GFR: 54.99 mL/min — ABNORMAL LOW (ref >60.00)
GLUCOSE: 142 mg/dL — AB (ref 70–99)
POTASSIUM: 4.4 meq/L (ref 3.5–5.1)
SODIUM: 142 meq/L (ref 135–145)
TOTAL PROTEIN: 7.3 g/dL (ref 6.0–8.3)
Total Bilirubin: 0.7 mg/dL (ref 0.2–1.2)

## 2015-12-15 LAB — TSH: TSH: 7.15 u[IU]/mL — AB (ref 0.35–4.50)

## 2015-12-15 LAB — LIPID PANEL
CHOLESTEROL: 137 mg/dL (ref 0–200)
HDL: 29.4 mg/dL — ABNORMAL LOW (ref >39.00)
LDL CALC: 78 mg/dL (ref 0–99)
NONHDL: 107.82
Total CHOL/HDL Ratio: 5
Triglycerides: 148 mg/dL (ref 0.0–149.0)
VLDL: 29.6 mg/dL (ref 0.0–40.0)

## 2015-12-15 LAB — HEMOGLOBIN A1C: HEMOGLOBIN A1C: 6.7 % — AB (ref 4.6–6.5)

## 2015-12-15 NOTE — Progress Notes (Signed)
   Subjective:    Patient ID: Jade Sullivan, female    DOB: [birth year], 70 y.o.   MRN: [id number]  HPI The patient is a 70 YO female coming in for follow up of her thyroid (still taking her synthroid 25 mcg daily, no symptoms of over or under replacement such as chills, weight change, constipation or diarrhea), and her sugars (she has not been exercising due to the heat this summer, diet is about the same, weight is stable). No new concerns. 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 She is also seeing her kidney specialist next week or two.   Review of Systems  Constitutional: Negative for activity change, appetite change, fatigue, fever and unexpected weight change.  Respiratory: Negative for cough, chest tightness and shortness of breath.   Cardiovascular: Negative for chest pain, palpitations and leg swelling.  Gastrointestinal: Negative.   Musculoskeletal: Positive for arthralgias. Negative for gait problem and myalgias.  Neurological: Negative.       Objective:   Physical Exam  Constitutional: She is oriented to person, place, and time. She appears well-developed and well-nourished.  Overweight  HENT:  Head: Normocephalic and atraumatic.  Eyes: EOM are normal.  Neck: Normal range of motion.  Cardiovascular: Normal rate and regular rhythm.   Pulmonary/Chest: Effort normal and breath sounds normal. No respiratory distress. She has no wheezes.  Abdominal: Soft. She exhibits no distension. There is no tenderness. There is no rebound.  Musculoskeletal: She exhibits no edema.  Neurological: She is alert and oriented to person, place, and time. Coordination normal.  Skin: Skin is warm and dry.   Vitals:   12/15/15 1010  BP: 128/90  Pulse: 65  Temp: 98.5 F (36.9 C)  TempSrc: Oral  SpO2: 98%  Weight: 234 lb (106.1 kg)  Height: 5\' 7"  (1.702 m)      Assessment & Plan:

## 2015-12-15 NOTE — Progress Notes (Signed)
Pre visit review using our clinic review tool, if applicable. No additional management support is needed unless otherwise documented below in the visit note. 

## 2015-12-15 NOTE — Assessment & Plan Note (Addendum)
Checking TSH and adjust as needed. Taking synthroid 25 mcg daily.

## 2015-12-15 NOTE — Assessment & Plan Note (Signed)
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 No complications noted and eye exam up to date.

## 2015-12-15 NOTE — Assessment & Plan Note (Signed)
Checking lipid panel and adjust her lipitor if needed.

## 2015-12-15 NOTE — Patient Instructions (Signed)
We are checking the labs today and will call you back with the results and send them to the kidney doctor.

## 2015-12-19 ENCOUNTER — Ambulatory Visit (INDEPENDENT_AMBULATORY_CARE_PROVIDER_SITE_OTHER): Payer: Medicare Other | Admitting: Family Medicine

## 2015-12-19 ENCOUNTER — Encounter: Payer: Self-pay | Admitting: Family Medicine

## 2015-12-19 DIAGNOSIS — M170 Bilateral primary osteoarthritis of knee: Secondary | ICD-10-CM | POA: Diagnosis not present

## 2015-12-19 NOTE — Progress Notes (Signed)
[facility] [address], [place] [address] Phone: [phone] Subjective:    I'm seeing this patient by the request  of:  Hoyt Koch, MD   CC: Bilateral knee pain follow-up  [id number]  Jade Sullivan is a 70 y.o. female coming in with complaint of bilateral knee pain. 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 Patient elected to try conservative therapy as well as an steroid injection. Patient was doing 90% better 2 months ago. Patient was to continue conservative therapy. Patient states She is doing very well. States that she is still approximately 90% better. Denies any worsening pain. As long as she wears the brace she seems to be able to do any activity she would like. No instability or any new findings.    Past Medical History:  Diagnosis Date  . Allergy    seasonal  . Arthritis    knees  . Chronic kidney disease   . Diverticulosis of colon (without mention of hemorrhage)   . Dysmetabolic syndrome X   . Gout   . H/O hyperthyroidism    By patient h/o hyperthyroidism with protosis, s/p subtotal thyroidectomy. On no replacement therapy. Last thyroid functions 2013 in [place]  Plan Will need thyroid functions Feb '15 with recommendations to follow.    . Heart murmur    yeras ago told had murmur  . Hyperlipidemia   . Hypertension    on multiple meds  . Hypothyroidism    Past Surgical History:  Procedure Laterality Date  . [place]  . COLONOSCOPY    . HERNIA REPAIR  2000  . LIPOMA EXCISION Right 11/24/2014   Procedure: EXCISION RIGHT SHOULDER LIPOMA;  Surgeon: Erroll Luna, MD;  Location: [facility];  Service: General;  Laterality: Right;  . POLYPECTOMY    . THYROIDECTOMY, PARTIAL  1981   Social History   Social History  . Marital status: Divorced    Spouse name: N/A  . Number of children: N/A  . Years of education: N/A   Occupational History  .  Retired    Social History Main Topics  . Smoking status: Former Research scientist (life sciences)  . Smokeless tobacco: Never Used  . Alcohol use No  . Drug use: No  . Sexual activity: No   Other Topics Concern  . None   Social History Narrative   Divorced   Retired - Engineer, maintenance (IT)   Allergies  Allergen Reactions  . Amlodipine     Gingival hyperplasia  . Hctz [Hydrochlorothiazide]     States caused her to have gout   Family History  Problem Relation Age of Onset  . Diabetes Mother     DM2  . Hypertension Mother   . Pneumonia Mother     died from this condition  . Colon polyps Mother   . Heart disease Father     died from this condition  . Colon polyps Father   . Kidney disease Sister     HBP and DM2; relative died from this condition  . Heart disease Brother     MI - HBP and DM2  . Colon cancer Neg Hx   . Esophageal cancer Neg Hx   . Rectal cancer Neg Hx   . Stomach cancer Neg Hx     Past medical history, social, surgical and family history all reviewed in electronic medical record.  No pertanent information unless stated regarding to the chief complaint.  Review of Systems: No headache, visual changes, nausea, vomiting, diarrhea, constipation, dizziness, abdominal pain, skin rash, fevers, chills, night sweats, weight loss, swollen lymph nodes, body aches, joint swelling, muscle aches, chest pain, shortness of breath, mood changes.   Objective  Blood pressure 138/82, pulse 84, SpO2 98 %.  General: No apparent distress alert and oriented x3 mood and affect normal, dressed appropriately.  HEENT: Pupils equal, extraocular movements intact  Respiratory: Patient's speak in full sentences and does not appear short of breath  Cardiovascular: No lower extremity edema, non tender, no erythema  Skin: Warm dry intact with no signs of infection or rash on extremities or on axial skeleton.  Abdomen: Soft nontender  Neuro: Cranial nerves II through XII are intact, neurovascularly intact in all  extremities with 2+ DTRs and 2+ pulses.  Lymph: No lymphadenopathy of posterior or anterior cervical chain or axillae bilaterally.  Gait normal with good balance and coordination.  MSK:  Non tender with full range of motion and good stability and symmetric strength and tone of shoulders, elbows, wrist, hip, and ankles bilaterally. Mild arthritic changes of multiple joints. Knee: Bilateral Valgus deformity of the knees bilaterally. Significant thigh to calf ratio bilaterally Mild arthritis of the medial joint line Full range of motion which is an improvement. Instability noted with valgus force bilaterally still noted Minimal painful patellar compression. Patellar glide with mild crepitus. Patellar and quadriceps tendons unremarkable. Hamstring and quadriceps strength is normal.  Seems stable from previous exam.     Impression and Recommendations:     This case required medical decision making of moderate complexity.      Note: This dictation was prepared with Dragon dictation along with smaller phrase technology. Any transcriptional errors that result from this process are unintentional.

## 2015-12-19 NOTE — Patient Instructions (Signed)
Good to see you  Keep up with the brace You are doing great  Ice when you need it pennsaid pinkie amount topically 2 times daily as needed.  Stay active See me when you need me and when you need another shot.

## 2015-12-19 NOTE — Assessment & Plan Note (Signed)
Doing well at this time. Wearing a brace on the right knee. Continue conservative therapy. Worsening symptoms patient will come back for any type of injection.

## 2016-01-22 ENCOUNTER — Other Ambulatory Visit: Payer: Self-pay | Admitting: Internal Medicine

## 2016-01-22 DIAGNOSIS — Z1231 Encounter for screening mammogram for malignant neoplasm of breast: Secondary | ICD-10-CM

## 2016-02-01 ENCOUNTER — Encounter: Payer: Self-pay | Admitting: Gynecology

## 2016-02-01 ENCOUNTER — Ambulatory Visit (INDEPENDENT_AMBULATORY_CARE_PROVIDER_SITE_OTHER): Payer: Medicare Other | Admitting: Gynecology

## 2016-02-01 VITALS — BP 138/86 | Ht 64.75 in | Wt 234.0 lb

## 2016-02-01 DIAGNOSIS — Z780 Asymptomatic menopausal state: Secondary | ICD-10-CM | POA: Diagnosis not present

## 2016-02-01 DIAGNOSIS — Z01419 Encounter for gynecological examination (general) (routine) without abnormal findings: Secondary | ICD-10-CM | POA: Diagnosis not present

## 2016-02-01 NOTE — Progress Notes (Signed)
Jade [facility] 11-19-1945 [id number]   History:    70 y.o.  for annual gyn exam with no complaints today. Last year during the time of her annual exam there was an area on the cervix and patient was asked to return to the office for colposcopy and possible biopsy. Her Pap smear was normal as was her cervical biopsy with no dysplasia.: Diagnosis 1. Endocervix, curettage - BENIGN DETACHED ENDOCERVICAL GLANDS AND SCANT FRAGMENT OF ATROPHIC SQUAMOUS EPITHELIUM. 2. Cervix, biopsy, 5 o'clock - FOCAL SMALL FRAGMENTS OF ATYPICAL SQUAMOUS EPITHELIUM. 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 Huey Bienenstock who is been treating her for her hypertension and hyperlipidemia as well as monitoring her chronic kidney disease. Patient also in 1982 had been diagnosed with Graves' disease and had a partial thyroidectomy and has established hypothyroidism for she's currently on medication. Patient reached the menopause at the age of 44 and has never been on any hormone replacement therapy. Her vasomotor symptoms and vaginal dryness is very nonspecific and very infrequent. Patient with no reported past history of any abnormal Pap smear. Patient had a colonoscopy in 2015 benign polyps removed. Patient had a normal bone density study here in our office in 2015.  Past medical history,surgical history, family history and social history were all reviewed and documented in the EPIC chart.  Gynecologic History No LMP recorded. Patient is postmenopausal. Contraception: post menopausal status Last Pap: See above. Results were: See above Last mammogram: 2016. Results were: normal  Obstetric History OB History  Gravida Para Term Preterm AB Living  1 1       1   SAB TAB Ectopic Multiple Live Births               # Outcome Date GA Lbr Len/2nd Weight Sex Delivery Anes PTL Lv  1 Para                ROS: A ROS was performed and pertinent positives and  negatives are included in the history.  GENERAL: No fevers or chills. HEENT: No change in vision, no earache, sore throat or sinus congestion. NECK: No pain or stiffness. CARDIOVASCULAR: No chest pain or pressure. No palpitations. PULMONARY: No shortness of breath, cough or wheeze. GASTROINTESTINAL: No abdominal pain, nausea, vomiting or diarrhea, melena or bright red blood per rectum. GENITOURINARY: No urinary frequency, urgency, hesitancy or dysuria. MUSCULOSKELETAL: No joint or muscle pain, no back pain, no recent trauma. DERMATOLOGIC: No rash, no itching, no lesions. ENDOCRINE: No polyuria, polydipsia, no heat or cold intolerance. No recent change in weight. HEMATOLOGICAL: No anemia or easy bruising or bleeding. NEUROLOGIC: No headache, seizures, numbness, tingling or weakness. PSYCHIATRIC: No depression, no loss of interest in normal activity or change in sleep pattern.     Exam: chaperone present  BP 138/86   Ht 5' 4.75" (1.645 m)   Wt 234 lb (106.1 kg)   BMI 39.24 kg/m   Body mass index is 39.24 kg/m.  General appearance : Well developed well nourished female. No acute distress HEENT: Eyes: no retinal hemorrhage or exudates,  Neck supple, trachea midline, no carotid bruits, no thyroidmegaly Lungs: Clear to auscultation, no rhonchi or wheezes, or rib retractions  Heart: Regular rate and rhythm, no murmurs or gallops Breast:Examined in sitting and supine position were symmetrical in appearance, no palpable masses or tenderness,  no skin retraction, no nipple inversion, no nipple discharge, no skin discoloration, no axillary or supraclavicular lymphadenopathy  Abdomen: no palpable masses or tenderness, no rebound or guarding Extremities: no edema or skin discoloration or tenderness  Pelvic:  Bartholin, Urethra, Skene Glands: Within normal limits             Vagina: No gross lesions or discharge  Cervix: No gross lesions or discharge  Uterus  anteverted, normal size, shape and  consistency, non-tender and mobile  Adnexa  Without masses or tenderness  Anus and perineum  normal   Rectovaginal  normal sphincter tone without palpated masses or tenderness             Hemoccult PCP provides     Assessment/Plan:  70 y.o. female for annual exam is scheduled for her mammogram this week. She will also make an appointment to schedule for bone density study year in the next few weeks in our office. We did do a Pap smear for follow-up but if normal this year she will no longer need Pap smears as recommended by the guidelines. She was reminded do her monthly breast exam. Her PCP has done her blood work as well as her vaccines are up-to-date. We discussed importance of calcium vitamin D and weightbearing exercises for osteoporosis prevention.   Terrance Mass MD, 12:01 PM 02/01/2016

## 2016-02-01 NOTE — Patient Instructions (Signed)

## 2016-02-05 LAB — PAP IG W/ RFLX HPV ASCU

## 2016-02-16 ENCOUNTER — Ambulatory Visit
Admission: RE | Admit: 2016-02-16 | Discharge: 2016-02-16 | Disposition: A | Discharge status: Home / Self Care | Payer: Medicare Other | Source: Ambulatory Visit | Attending: Internal Medicine | Admitting: Internal Medicine

## 2016-02-16 DIAGNOSIS — Z1231 Encounter for screening mammogram for malignant neoplasm of breast: Secondary | ICD-10-CM

## 2016-03-07 ENCOUNTER — Ambulatory Visit (INDEPENDENT_AMBULATORY_CARE_PROVIDER_SITE_OTHER): Payer: Medicare Other

## 2016-03-07 ENCOUNTER — Other Ambulatory Visit: Payer: Self-pay | Admitting: Gynecology

## 2016-03-07 DIAGNOSIS — Z780 Asymptomatic menopausal state: Secondary | ICD-10-CM | POA: Diagnosis not present

## 2016-07-25 LAB — CBC AND DIFFERENTIAL
HEMATOCRIT: 41 % (ref 36–46)
HEMOGLOBIN: 14.5 g/dL (ref 12.0–16.0)
NEUTROS ABS: 4 /uL
PLATELETS: 211 10*3/uL (ref 150–399)
WBC: 6.3 10^3/mL

## 2016-07-25 LAB — HEPATIC FUNCTION PANEL
ALK PHOS: 101 U/L (ref 25–125)
ALT: 12 U/L (ref 7–35)
AST: 16 U/L (ref 13–35)

## 2016-07-25 LAB — TSH: TSH: 6.73 u[IU]/mL — AB (ref 0.41–5.90)

## 2016-07-25 LAB — LIPID PANEL
CHOLESTEROL: 148 mg/dL (ref 0–200)
HDL: 31 mg/dL — AB (ref 35–70)
LDL CALC: 88 mg/dL
TRIGLYCERIDES: 144 mg/dL (ref 40–160)

## 2016-07-25 LAB — BASIC METABOLIC PANEL
BUN: 12 mg/dL (ref 4–21)
Creatinine: 1.1 mg/dL (ref 0.5–1.1)
Glucose: 188 mg/dL
Potassium: 5 mmol/L (ref 3.4–5.3)
Sodium: 141 mmol/L (ref 137–147)

## 2016-07-31 ENCOUNTER — Encounter: Payer: Self-pay | Admitting: Internal Medicine

## 2016-07-31 NOTE — Progress Notes (Unsigned)
Results entered and sent to scan  

## 2016-08-07 ENCOUNTER — Ambulatory Visit (INDEPENDENT_AMBULATORY_CARE_PROVIDER_SITE_OTHER): Payer: Medicare Other | Admitting: Internal Medicine

## 2016-08-07 ENCOUNTER — Encounter: Payer: Self-pay | Admitting: Internal Medicine

## 2016-08-07 DIAGNOSIS — E034 Atrophy of thyroid (acquired): Secondary | ICD-10-CM

## 2016-08-07 MED ORDER — LEVOTHYROXINE SODIUM 50 MCG PO TABS: 50.0000 ug | ORAL_TABLET | Freq: Every day | ORAL | 2 refills | Status: DC

## 2016-08-07 NOTE — Patient Instructions (Signed)
We have increased the thyroid medicine dose and sent in the new prescription.   With the pills you have left you can take 2 pills daily until they are gone.

## 2016-08-07 NOTE — Progress Notes (Signed)
   Subjective:    Patient ID: Jade Sullivan, female    DOB: 1946-01-16, 71 y.o.   MRN: [id number]  HPI The patient is a 71 YO female coming in for follow up of her thyroid. She never came back for repeat levels and we had recommended increase in dosage which she did not do. She has been feeling more lethargic and gaining some weight. She is having some cold intolerance as well. She denies tremors, diarrhea or constipation. She is taking her thyroid medicine in the morning, spaced from food or drink or other meds by 30 minutes at least.   Review of Systems  Constitutional: Positive for activity change, fatigue and unexpected weight change. Negative for appetite change.  HENT: Negative.   Respiratory: Negative.   Cardiovascular: Negative.   Gastrointestinal: Negative.   Endocrine: Positive for cold intolerance. Negative for heat intolerance, polydipsia, polyphagia and polyuria.  Musculoskeletal: Negative.   Skin: Negative.   Neurological: Negative.       Objective:   Physical Exam  Constitutional: She is oriented to person, place, and time. She appears well-developed and well-nourished.  HENT:  Head: Normocephalic and atraumatic.  Eyes: EOM are normal.  Neck: Normal range of motion.  Cardiovascular: Normal rate and regular rhythm.   Pulmonary/Chest: Effort normal. No respiratory distress. She has no wheezes. She has no rales.  Abdominal: Soft.  Neurological: She is alert and oriented to person, place, and time.  Skin: Skin is warm and dry.   Vitals:   08/07/16 1012 08/07/16 1051  BP: (!) 168/96 (!) 150/88  Pulse: 87   Resp: 14   Temp: 98.2 F (36.8 C)   TempSrc: Oral   SpO2: 98%   Weight: 236 lb (107 kg)   Height: 5' 4.75" (1.645 m)       Assessment & Plan:

## 2016-08-09 NOTE — Assessment & Plan Note (Signed)
Increase synthroid to 50 mcg daily and TSH and free T4 ordered for 4-6 weeks and reminded her of the importance of getting these labs done to evaluate the changes. She is taking it appropriately.

## 2016-08-10 ENCOUNTER — Other Ambulatory Visit: Payer: Self-pay | Admitting: Internal Medicine

## 2016-08-13 ENCOUNTER — Encounter: Payer: Self-pay | Admitting: Internal Medicine

## 2016-08-14 ENCOUNTER — Encounter: Payer: Self-pay | Admitting: Gynecology

## 2016-08-19 NOTE — Progress Notes (Signed)
[name] [address], [place] [address] Phone: [phone] Subjective:     CC: Bilateral knee pain  ERD:[id number]  Jade Sullivan is a 71 y.o. female coming in with complaint of bilateral knee pain. Known history of degenerative joint disease. Has responded well to injection. Last injections were 8 months ago. Patient states Patient states increasing pain, more instability more swelling, affectin g ADL's, no new symptoms just worsening of previous symptoms.      Past Medical History:  Diagnosis Date  . Allergy    seasonal  . Arthritis    knees  . Chronic kidney disease   . Diverticulosis of colon (without mention of hemorrhage)   . Dysmetabolic syndrome X   . Gout   . H/O hyperthyroidism    By patient h/o hyperthyroidism with protosis, s/p subtotal thyroidectomy. On no replacement therapy. Last thyroid functions 2013 in [place]  Plan Will need thyroid functions Feb '15 with recommendations to follow.    . Heart murmur    yeras ago told had murmur  . Hyperlipidemia   . Hypertension    on multiple meds  . Hypothyroidism    Past Surgical History:  Procedure Laterality Date  . [place]  . COLONOSCOPY    . HERNIA REPAIR  2000  . LIPOMA EXCISION Right 11/24/2014   Procedure: EXCISION RIGHT SHOULDER LIPOMA;  Surgeon: Erroll Luna, MD;  Location: [facility];  Service: General;  Laterality: Right;  . POLYPECTOMY    . THYROIDECTOMY, PARTIAL  1981   Social History   Social History  . Marital status: Divorced    Spouse name: N/A  . Number of children: N/A  . Years of education: N/A   Occupational History  . Retired    Social History Main Topics  . Smoking status: Former Research scientist (life sciences)  . Smokeless tobacco: Never Used  . Alcohol use No  . Drug use: No  . Sexual activity: No   Other Topics Concern  . None   Social History Narrative   Divorced   Retired - Engineer, maintenance (IT)   Allergies  Allergen  Reactions  . Amlodipine     Gingival hyperplasia  . Hctz [Hydrochlorothiazide]     States caused her to have gout   Family History  Problem Relation Age of Onset  . Diabetes Mother        DM2  . Hypertension Mother   . Pneumonia Mother        died from this condition  . Colon polyps Mother   . Heart disease Father        died from this condition  . Colon polyps Father   . Kidney disease Sister        HBP and DM2; relative died from this condition  . Heart disease Brother        MI - HBP and DM2  . Colon cancer Neg Hx   . Esophageal cancer Neg Hx   . Rectal cancer Neg Hx   . Stomach cancer Neg Hx     Past medical history, social, surgical and family history all reviewed in electronic medical record.  No pertanent information unless stated regarding to the chief complaint.   Review of Systems:Review of systems updated and as accurate as of 08/20/16  No headache, visual changes, nausea, vomiting, diarrhea, constipation, dizziness, abdominal pain, skin rash, fevers, chills, night sweats, weight loss, swollen lymph nodes,chest pain, shortness of breath, mood changes.  Positive muscle aches and body aches  Objective  Blood pressure (!) 190/98, pulse 71, height 5\' 7"  (1.702 m), weight 238 lb (108 kg), SpO2 98 %. Systems examined below as of 08/20/16   General: No apparent distress alert and oriented x3 mood and affect normal, dressed appropriately.  HEENT: Pupils equal, extraocular movements intact  Respiratory: Patient's speak in full sentences and does not appear short of breath  Cardiovascular: No lower extremity edema, non tender, no erythema  Skin: Warm dry intact with no signs of infection or rash on extremities or on axial skeleton.  Abdomen: Soft nontender  Neuro: Cranial nerves II through XII are intact, neurovascularly intact in all extremities with 2+ DTRs and 2+ pulses.  Lymph: No lymphadenopathy of posterior or anterior cervical chain or axillae bilaterally.  Gait  Antalgic gait.  MSK:  Non tender with full range of motion and good stability and symmetric strength and tone of shoulders, elbows, wrist, hip, and ankles bilaterally. Mild arthritic changes of multiple joints Knee:Bilaterally valgus deformity noted. Large thigh to calf ratio.  Tender to palpation over medial and PF joint line.  ROM full in flexion and extension and lower leg rotation. instability with valgus force.  painful patellar compression. Patellar glide with moderate crepitus. Patellar and quadriceps tendons unremarkable. Hamstring and quadriceps strength is normal.  After informed written and verbal consent, patient was seated on exam table. Right knee was prepped with alcohol swab and utilizing anterolateral approach, patient's right knee space was injected with 4:1  marcaine 0.5%: Kenalog 40mg /dL. Patient tolerated the procedure well without immediate complications.  After informed written and verbal consent, patient was seated on exam table. Left knee was prepped with alcohol swab and utilizing anterolateral approach, patient's left knee space was injected with 4:1  marcaine 0.5%: Kenalog 40mg /dL. Patient tolerated the procedure well without immediate complications.   Impression and Recommendations:     This case required medical decision making of moderate complexity.      Note: This dictation was prepared with Dragon dictation along with smaller phrase technology. Any transcriptional errors that result from this process are unintentional.

## 2016-08-20 ENCOUNTER — Encounter: Payer: Self-pay | Admitting: Family Medicine

## 2016-08-20 ENCOUNTER — Ambulatory Visit (INDEPENDENT_AMBULATORY_CARE_PROVIDER_SITE_OTHER): Payer: Medicare Other | Admitting: Family Medicine

## 2016-08-20 DIAGNOSIS — M170 Bilateral primary osteoarthritis of knee: Secondary | ICD-10-CM

## 2016-08-20 NOTE — Patient Instructions (Addendum)
reat to see yo u Injected both lknees today  Ice 20 minutes 2 times daily. Usually after activity and before bed. We can repeat every 3 months if needed.  If worsening before then lets see you sooner and can consider the other injections.  Otherwise see me when you need me.

## 2016-08-20 NOTE — Assessment & Plan Note (Signed)
Worsening symptoms. Bilateral injections given today. Tolerated the procedure well. We discussed icing regimen and home exercises. Which activities to do in which ones to avoid. Patient knows that she would be a candidate for viscous supplementation if needed. Has been doing relatively well and only needing the injections periodically. Continue to encourage patient to lose weight as well.

## 2016-08-26 ENCOUNTER — Other Ambulatory Visit: Payer: Self-pay | Admitting: Internal Medicine

## 2016-10-29 ENCOUNTER — Encounter: Payer: Self-pay | Admitting: Family Medicine

## 2016-10-29 ENCOUNTER — Ambulatory Visit (INDEPENDENT_AMBULATORY_CARE_PROVIDER_SITE_OTHER): Payer: Medicare Other | Admitting: Family Medicine

## 2016-10-29 DIAGNOSIS — M170 Bilateral primary osteoarthritis of knee: Secondary | ICD-10-CM | POA: Diagnosis not present

## 2016-10-29 DIAGNOSIS — S43001A Unspecified subluxation of right shoulder joint, initial encounter: Secondary | ICD-10-CM | POA: Diagnosis not present

## 2016-10-29 MED ORDER — GABAPENTIN 100 MG PO CAPS: 200.0000 mg | ORAL_CAPSULE | Freq: Every day | ORAL | 3 refills | Status: DC
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
## 2016-10-29 NOTE — Assessment & Plan Note (Signed)
I believe the patient's previous history of surgery in the injury patient did have some subluxation of the shoulder. Did have some manipulation today and did have an audible popping sound and decreasing pain. Patient was given home exercises and work with Product/process development scientist. We discussed icing regimen. We discussed which activities to do in which ones to avoid. We discussed icing regimen. Patient will come back and see me again in 3-4 weeks.

## 2016-10-29 NOTE — Assessment & Plan Note (Signed)
Stable at the moment. We'll continue to monitor. Follow-up again in 3-4 weeks if needed.

## 2016-10-29 NOTE — Patient Instructions (Signed)
Good to see you I think your shoulder was subluxed.  Ice 20 minutes 2 times daily. Usually after activity and before bed. Keep hands within peripheral vision most of the day  Gabapentin 200mg  at night pennsaid pinkie amount topically 2 times daily as needed.  See me again in 3-4 weeks for the knees and the shoulder

## 2016-11-19 ENCOUNTER — Ambulatory Visit (INDEPENDENT_AMBULATORY_CARE_PROVIDER_SITE_OTHER): Payer: Medicare Other | Admitting: Family Medicine

## 2016-11-19 ENCOUNTER — Encounter: Payer: Self-pay | Admitting: Family Medicine

## 2016-11-19 DIAGNOSIS — M170 Bilateral primary osteoarthritis of knee: Secondary | ICD-10-CM

## 2016-11-19 NOTE — Progress Notes (Signed)
[name] [address] [place], [place] [address] Phone: [phone] Subjective:     CC: Bilateral knee pain right shoulder pain  [id number]  Jade Sullivan is a 71 y.o. female coming in with complaint of bilateral knee pain. Known severe arthritis of the knees bilaterally. Patient last had injections 10 weeks ago. Starting have increasing discomfort. Discussed with patient about icing regimen and home exercises. Patient is having worsening discomfort again.  Patient did have a right shoulder pain and did have a subluxation. Patient did have manipulation and sensation is 100% better.    Past Medical History:  Diagnosis Date  . Allergy    seasonal  . Arthritis    knees  . Chronic kidney disease   . Diverticulosis of colon (without mention of hemorrhage)   . Dysmetabolic syndrome X   . Gout   . H/O hyperthyroidism    By patient h/o hyperthyroidism with protosis, s/p subtotal thyroidectomy. On no replacement therapy. Last thyroid functions 2013 in [place]  Plan Will need thyroid functions Feb '15 with recommendations to follow.    . Heart murmur    yeras ago told had murmur  . Hyperlipidemia   . Hypertension    on multiple meds  . Hypothyroidism    Past Surgical History:  Procedure Laterality Date  . [place]  . COLONOSCOPY    . HERNIA REPAIR  2000  . LIPOMA EXCISION Right 11/24/2014   Procedure: EXCISION RIGHT SHOULDER LIPOMA;  Surgeon: Erroll Luna, MD;  Location: [facility];  Service: General;  Laterality: Right;  . POLYPECTOMY    . THYROIDECTOMY, PARTIAL  1981   Social History   Social History  . Marital status: Divorced    Spouse name: N/A  . Number of children: N/A  . Years of education: N/A   Occupational History  . Retired    Social History Main Topics  . Smoking status: Former Research scientist (life sciences)  . Smokeless tobacco: Never Used  . Alcohol use No  . Drug use: No  . Sexual activity: No   Other  Topics Concern  . None   Social History Narrative   Divorced   Retired - Engineer, maintenance (IT)   Allergies  Allergen Reactions  . Amlodipine     Gingival hyperplasia  . Hctz [Hydrochlorothiazide]     States caused her to have gout   Family History  Problem Relation Age of Onset  . Diabetes Mother        DM2  . Hypertension Mother   . Pneumonia Mother        died from this condition  . Colon polyps Mother   . Heart disease Father        died from this condition  . Colon polyps Father   . Kidney disease Sister        HBP and DM2; relative died from this condition  . Heart disease Brother        MI - HBP and DM2  . Colon cancer Neg Hx   . Esophageal cancer Neg Hx   . Rectal cancer Neg Hx   . Stomach cancer Neg Hx     Past medical history, social, surgical and family history all reviewed in electronic medical record.  No pertanent information unless stated regarding to the chief complaint.   Review of Systems: No headache, visual changes, nausea, vomiting, diarrhea, constipation, dizziness, abdominal pain, skin rash, fevers, chills, night sweats, weight loss, swollen  lymph nodes, body aches, joint swelling, muscle aches, chest pain, shortness of breath, mood changes.    Objective  Blood pressure 126/84, pulse (!) 57, height 5\' 6"  (1.676 m), weight 237 lb (107.5 kg), SpO2 97 %.   Systems examined below as of 11/19/16 General: NAD A&O x3 mood, affect normal  HEENT: Pupils equal, extraocular movements intact no nystagmus Respiratory: not short of breath at rest or with speaking Cardiovascular: No lower extremity edema, non tender Skin: Warm dry intact with no signs of infection or rash on extremities or on axial skeleton. Abdomen: Soft nontender, no masses Neuro: Cranial nerves  intact, neurovascularly intact in all extremities with 2+ DTRs and 2+ pulses. Lymph: No lymphadenopathy appreciated today  Gait normal with good balance and coordination.  MSK: Non tender with full  range of motion and good stability and symmetric strength and tone of shoulders, elbows, wrist,  hips and ankles bilaterally.   Knee: Bilateral valgus deformity noted. Large thigh to calf ratio.  Tender to palpation over medial and PF joint line.  ROM full in flexion and extension and lower leg rotation. instability with valgus force.  painful patellar compression. Patellar glide with moderate crepitus. Patellar and quadriceps tendons unremarkable. Hamstring and quadriceps strength is normal.   After informed written and verbal consent, patient was seated on exam table. Right knee was prepped with alcohol swab and utilizing anterolateral approach, patient's right knee space was injected with 4:1  marcaine 0.5%: Kenalog 40mg /dL. Patient tolerated the procedure well without immediate complications.  After informed written and verbal consent, patient was seated on exam table. Left knee was prepped with alcohol swab and utilizing anterolateral approach, patient's left knee space was injected with 4:1  marcaine 0.5%: Kenalog 40mg /dL. Patient tolerated the procedure well without immediate complications.    Impression and Recommendations:     This case required medical decision making of moderate complexity.      Note: This dictation was prepared with Dragon dictation along with smaller phrase technology. Any transcriptional errors that result from this process are unintentional.

## 2016-11-19 NOTE — Patient Instructions (Signed)
Good to see you as always You had a subluxed shoulder last time but you are doing great  Injected both knees.  See me again in 3 months!!!  If worsening pain come back sooner.

## 2016-11-19 NOTE — Assessment & Plan Note (Signed)
Patient given injections. Tolerated the procedure well. We discussed icing regimen. We discussed home exercises. Topical anti-inflammatory's prescribed. Patient will come back and see me again in 4 weeks and we'll consider viscous supplementation.

## 2017-01-16 ENCOUNTER — Other Ambulatory Visit: Payer: Self-pay | Admitting: Internal Medicine

## 2017-01-16 DIAGNOSIS — Z1231 Encounter for screening mammogram for malignant neoplasm of breast: Secondary | ICD-10-CM

## 2017-02-17 ENCOUNTER — Ambulatory Visit
Admission: RE | Admit: 2017-02-17 | Discharge: 2017-02-17 | Disposition: A | Discharge status: Home / Self Care | Payer: Medicare Other | Source: Ambulatory Visit | Attending: Internal Medicine | Admitting: Internal Medicine

## 2017-02-17 DIAGNOSIS — Z1231 Encounter for screening mammogram for malignant neoplasm of breast: Secondary | ICD-10-CM

## 2017-02-24 ENCOUNTER — Encounter: Payer: Self-pay | Admitting: Obstetrics & Gynecology

## 2017-02-24 ENCOUNTER — Ambulatory Visit: Payer: Medicare Other | Admitting: Obstetrics & Gynecology

## 2017-02-24 VITALS — BP 138/90 | Ht 64.5 in | Wt 239.0 lb

## 2017-02-24 DIAGNOSIS — Z780 Asymptomatic menopausal state: Secondary | ICD-10-CM | POA: Diagnosis not present

## 2017-02-24 DIAGNOSIS — Z124 Encounter for screening for malignant neoplasm of cervix: Secondary | ICD-10-CM | POA: Diagnosis not present

## 2017-02-24 DIAGNOSIS — Z6841 Body Mass Index (BMI) 40.0 and over, adult: Secondary | ICD-10-CM

## 2017-02-24 DIAGNOSIS — Z01419 Encounter for gynecological examination (general) (routine) without abnormal findings: Secondary | ICD-10-CM | POA: Diagnosis not present

## 2017-02-24 NOTE — Addendum Note (Signed)
Addended by: Thurnell Garbe A on: 02/24/2017 11:46 AM   Modules accepted: Orders

## 2017-02-24 NOTE — Progress Notes (Signed)
Jade Sullivan 04-Aug-1945 [id number]   History:    71 y.o. G1P1L1  Divorced.  Son is 49+ yo in [place].  RP:  Established patient presenting  for annual gyn exam   HPI: Menopausal, well without hormone replacement therapy.  No postmenopausal bleeding.  No pelvic pain.  Abstinent.  Last year had colposcopy for a small focus on the cervix.  Pap test was negative and cervical biopsy showed only mild Koilocytosis.  Breasts normal.  Urine and bowel movements normal.  Health labs with family physician.  Adjusting her Synthroid, dosage was increased recently, repeat thyroid labs planned for next month.    Past medical history,surgical history, family history and social history were all reviewed and documented in the EPIC chart.  Gynecologic History No LMP recorded. Patient is postmenopausal. Contraception: post menopausal status Last Pap: 01/2016. Results were: Negative Last mammogram: November 2018. Results were: Negative Bone Density November 2017 normal Colonoscopy 2016  Obstetric History OB History  Gravida Para Term Preterm AB Living  1 1       1   SAB TAB Ectopic Multiple Live Births               # Outcome Date GA Lbr Len/2nd Weight Sex Delivery Anes PTL Lv  1 Para                ROS: A ROS was performed and pertinent positives and negatives are included in the history.  GENERAL: No fevers or chills. HEENT: No change in vision, no earache, sore throat or sinus congestion. NECK: No pain or stiffness. CARDIOVASCULAR: No chest pain or pressure. No palpitations. PULMONARY: No shortness of breath, cough or wheeze. GASTROINTESTINAL: No abdominal pain, nausea, vomiting or diarrhea, melena or bright red blood per rectum. GENITOURINARY: No urinary frequency, urgency, hesitancy or dysuria. MUSCULOSKELETAL: No joint or muscle pain, no back pain, no recent trauma. DERMATOLOGIC: No rash, no itching, no lesions. ENDOCRINE: No polyuria, polydipsia, no heat or cold intolerance. No recent change in  weight. HEMATOLOGICAL: No anemia or easy bruising or bleeding. NEUROLOGIC: No headache, seizures, numbness, tingling or weakness. PSYCHIATRIC: No depression, no loss of interest in normal activity or change in sleep pattern.     Exam:   Ht 5' 4.5" (1.638 m)   Wt 239 lb (108.4 kg)   BMI 40.39 kg/m   Body mass index is 40.39 kg/m.  General appearance : Well developed well nourished female. No acute distress HEENT: Eyes: no retinal hemorrhage or exudates,  Neck supple, trachea midline, no carotid bruits, no thyroidmegaly Lungs: Clear to auscultation, no rhonchi or wheezes, or rib retractions  Heart: Regular rate and rhythm, no murmurs or gallops Breast:Examined in sitting and supine position were symmetrical in appearance, no palpable masses or tenderness,  no skin retraction, no nipple inversion, no nipple discharge, no skin discoloration, no axillary or supraclavicular lymphadenopathy Abdomen: no palpable masses or tenderness, no rebound or guarding Extremities: no edema or skin discoloration or tenderness  Pelvic: Vulva normal  Bartholin, Urethra, Skene Glands: Within normal limits             Vagina: No gross lesions or discharge  Cervix: No gross lesions or discharge.  Pap reflex done.  Uterus  AV, normal size, shape and consistency, non-tender and mobile  Adnexa  Without masses or tenderness  Anus and perineum  normal    Assessment/Plan:  71 y.o. female for annual exam   1. Encounter for routine gynecological examination with Papanicolaou smear of  cervix Normal gynecologic exam.  Pap reflex done.  Breast exam normal.  Recent mammogram negative.  Labs with family physician.  Colonoscopy 2016.  2. Menopause present Well without hormone replacement therapy.  No postmenopausal bleeding.  Vitamin D supplements, calcium in nutrition and weightbearing physical activity.  3. Class 3 severe obesity due to excess calories without serious comorbidity with body mass index (BMI) of 40.0  to 44.9 in adult [facility]) Recommend low calorie low-carb diet with regular physical activity.  Continue with water aerobics.  Princess Bruins MD, 10:54 AM 02/24/2017

## 2017-02-24 NOTE — Patient Instructions (Signed)
1. Encounter for routine gynecological examination with Papanicolaou smear of cervix Normal gynecologic exam.  Pap reflex done.  Breast exam normal.  Recent mammogram negative.  Labs with family physician.  Colonoscopy 2016.  2. Menopause present Well without hormone replacement therapy.  No postmenopausal bleeding.  Vitamin D supplements, calcium in nutrition and weightbearing physical activity.  3. Class 3 severe obesity due to excess calories without serious comorbidity with body mass index (BMI) of 40.0 to 44.9 in adult [facility]) Recommend low calorie low-carb diet with regular physical activity.  Continue with water aerobics.  Jade Sullivan, it was a pleasure meeting you today!  I will inform you of your results as soon as available.   Health Maintenance for Postmenopausal Women Menopause is a normal process in which your reproductive ability comes to an end. This process happens gradually over a span of months to years, usually between the ages of 43 and 14. Menopause is complete when you have missed 12 consecutive menstrual periods. It is important to talk with your health care provider about some of the most common conditions that affect postmenopausal women, such as heart disease, cancer, and bone loss (osteoporosis). Adopting a healthy lifestyle and getting preventive care can help to promote your health and wellness. Those actions can also lower your chances of developing some of these common conditions. What should I know about menopause? During menopause, you may experience a number of symptoms, such as:  Moderate-to-severe hot flashes.  Night sweats.  Decrease in sex drive.  Mood swings.  Headaches.  Tiredness.  Irritability.  Memory problems.  Insomnia.  Choosing to treat or not to treat menopausal changes is an individual decision that you make with your health care provider. What should I know about hormone replacement therapy and supplements? Hormone therapy products are  effective for treating symptoms that are associated with menopause, such as hot flashes and night sweats. Hormone replacement carries certain risks, especially as you become older. If you are thinking about using estrogen or estrogen with progestin treatments, discuss the benefits and risks with your health care provider. What should I know about heart disease and stroke? Heart disease, heart attack, and stroke become more likely as you age. This may be due, in part, to the hormonal changes that your body experiences during menopause. These can affect how your body processes dietary fats, triglycerides, and cholesterol. Heart attack and stroke are both medical emergencies. There are many things that you can do to help prevent heart disease and stroke:  Have your blood pressure checked at least every 1-2 years. High blood pressure causes heart disease and increases the risk of stroke.  If you are 73-49 years old, ask your health care provider if you should take aspirin to prevent a heart attack or a stroke.  Do not use any tobacco products, including cigarettes, chewing tobacco, or electronic cigarettes. If you need help quitting, ask your health care provider.  It is important to eat a healthy diet and maintain a healthy weight. ? Be sure to include plenty of vegetables, fruits, low-fat dairy products, and lean protein. ? Avoid eating foods that are high in solid fats, added sugars, or salt (sodium).  Get regular exercise. This is one of the most important things that you can do for your health. ? Try to exercise for at least 150 minutes each week. The type of exercise that you do should increase your heart rate and make you sweat. This is known as moderate-intensity exercise. ? Try to  do strengthening exercises at least twice each week. Do these in addition to the moderate-intensity exercise.  Know your numbers.Ask your health care provider to check your cholesterol and your blood glucose.  Continue to have your blood tested as directed by your health care provider.  What should I know about cancer screening? There are several types of cancer. Take the following steps to reduce your risk and to catch any cancer development as early as possible. Breast Cancer  Practice breast self-awareness. ? This means understanding how your breasts normally appear and feel. ? It also means doing regular breast self-exams. Let your health care provider know about any changes, no matter how small.  If you are 39 or older, have a clinician do a breast exam (clinical breast exam or CBE) every year. Depending on your age, family history, and medical history, it may be recommended that you also have a yearly breast X-ray (mammogram).  If you have a family history of breast cancer, talk with your health care provider about genetic screening.  If you are at high risk for breast cancer, talk with your health care provider about having an MRI and a mammogram every year.  Breast cancer (BRCA) gene test is recommended for women who have family members with BRCA-related cancers. Results of the assessment will determine the need for genetic counseling and BRCA1 and for BRCA2 testing. BRCA-related cancers include these types: ? Breast. This occurs in males or females. ? Ovarian. ? Tubal. This may also be called fallopian tube cancer. ? Cancer of the abdominal or pelvic lining (peritoneal cancer). ? Prostate. ? Pancreatic.  Cervical, Uterine, and Ovarian Cancer Your health care provider may recommend that you be screened regularly for cancer of the pelvic organs. These include your ovaries, uterus, and vagina. This screening involves a pelvic exam, which includes checking for microscopic changes to the surface of your cervix (Pap test).  For women ages 21-65, health care providers may recommend a pelvic exam and a Pap test every three years. For women ages 49-65, they may recommend the Pap test and pelvic  exam, combined with testing for human papilloma virus (HPV), every five years. Some types of HPV increase your risk of cervical cancer. Testing for HPV may also be done on women of any age who have unclear Pap test results.  Other health care providers may not recommend any screening for nonpregnant women who are considered low risk for pelvic cancer and have no symptoms. Ask your health care provider if a screening pelvic exam is right for you.  If you have had past treatment for cervical cancer or a condition that could lead to cancer, you need Pap tests and screening for cancer for at least 20 years after your treatment. If Pap tests have been discontinued for you, your risk factors (such as having a new sexual partner) need to be reassessed to determine if you should start having screenings again. Some women have medical problems that increase the chance of getting cervical cancer. In these cases, your health care provider may recommend that you have screening and Pap tests more often.  If you have a family history of uterine cancer or ovarian cancer, talk with your health care provider about genetic screening.  If you have vaginal bleeding after reaching menopause, tell your health care provider.  There are currently no reliable tests available to screen for ovarian cancer.  Lung Cancer Lung cancer screening is recommended for adults 48-24 years old who are at high  risk for lung cancer because of a history of smoking. A yearly low-dose CT scan of the lungs is recommended if you:  Currently smoke.  Have a history of at least 30 pack-years of smoking and you currently smoke or have quit within the past 15 years. A pack-year is smoking an average of one pack of cigarettes per day for one year.  Yearly screening should:  Continue until it has been 15 years since you quit.  Stop if you develop a health problem that would prevent you from having lung cancer treatment.  Colorectal  Cancer  This type of cancer can be detected and can often be prevented.  Routine colorectal cancer screening usually begins at age 58 and continues through age 35.  If you have risk factors for colon cancer, your health care provider may recommend that you be screened at an earlier age.  If you have a family history of colorectal cancer, talk with your health care provider about genetic screening.  Your health care provider may also recommend using home test kits to check for hidden blood in your stool.  A small camera at the end of a tube can be used to examine your colon directly (sigmoidoscopy or colonoscopy). This is done to check for the earliest forms of colorectal cancer.  Direct examination of the colon should be repeated every 5-10 years until age 59. However, if early forms of precancerous polyps or small growths are found or if you have a family history or genetic risk for colorectal cancer, you may need to be screened more often.  Skin Cancer  Check your skin from head to toe regularly.  Monitor any moles. Be sure to tell your health care provider: ? About any new moles or changes in moles, especially if there is a change in a mole's shape or color. ? If you have a mole that is larger than the size of a pencil eraser.  If any of your family members has a history of skin cancer, especially at a young age, talk with your health care provider about genetic screening.  Always use sunscreen. Apply sunscreen liberally and repeatedly throughout the day.  Whenever you are outside, protect yourself by wearing long sleeves, pants, a wide-brimmed hat, and sunglasses.  What should I know about osteoporosis? Osteoporosis is a condition in which bone destruction happens more quickly than new bone creation. After menopause, you may be at an increased risk for osteoporosis. To help prevent osteoporosis or the bone fractures that can happen because of osteoporosis, the following is  recommended:  If you are 35-91 years old, get at least 1,000 mg of calcium and at least 600 mg of vitamin D per day.  If you are older than age 26 but younger than age 42, get at least 1,200 mg of calcium and at least 600 mg of vitamin D per day.  If you are older than age 75, get at least 1,200 mg of calcium and at least 800 mg of vitamin D per day.  Smoking and excessive alcohol intake increase the risk of osteoporosis. Eat foods that are rich in calcium and vitamin D, and do weight-bearing exercises several times each week as directed by your health care provider. What should I know about how menopause affects my mental health? Depression may occur at any age, but it is more common as you become older. Common symptoms of depression include:  Low or sad mood.  Changes in sleep patterns.  Changes in appetite  or eating patterns.  Feeling an overall lack of motivation or enjoyment of activities that you previously enjoyed.  Frequent crying spells.  Talk with your health care provider if you think that you are experiencing depression. What should I know about immunizations? It is important that you get and maintain your immunizations. These include:  Tetanus, diphtheria, and pertussis (Tdap) booster vaccine.  Influenza every year before the flu season begins.  Pneumonia vaccine.  Shingles vaccine.  Your health care provider may also recommend other immunizations. This information is not intended to replace advice given to you by your health care provider. Make sure you discuss any questions you have with your health care provider. Document Released: 05/10/2005 Document Revised: 10/06/2015 Document Reviewed: 12/20/2014 Elsevier Interactive Patient Education  2018 [employer].

## 2017-02-27 LAB — PAP IG W/ RFLX HPV ASCU

## 2017-03-11 ENCOUNTER — Ambulatory Visit: Payer: Medicare Other | Admitting: Internal Medicine

## 2017-03-17 ENCOUNTER — Ambulatory Visit: Payer: Medicare Other | Admitting: Internal Medicine

## 2017-03-17 ENCOUNTER — Other Ambulatory Visit (INDEPENDENT_AMBULATORY_CARE_PROVIDER_SITE_OTHER): Payer: Medicare Other

## 2017-03-17 ENCOUNTER — Encounter: Payer: Self-pay | Admitting: Internal Medicine

## 2017-03-17 VITALS — BP 140/90 | HR 70 | Temp 98.5°F | Ht 64.5 in | Wt 231.0 lb

## 2017-03-17 DIAGNOSIS — E559 Vitamin D deficiency, unspecified: Secondary | ICD-10-CM

## 2017-03-17 DIAGNOSIS — Z1159 Encounter for screening for other viral diseases: Secondary | ICD-10-CM

## 2017-03-17 DIAGNOSIS — E538 Deficiency of other specified B group vitamins: Secondary | ICD-10-CM

## 2017-03-17 DIAGNOSIS — R7301 Impaired fasting glucose: Secondary | ICD-10-CM

## 2017-03-17 DIAGNOSIS — Z23 Encounter for immunization: Secondary | ICD-10-CM

## 2017-03-17 DIAGNOSIS — E039 Hypothyroidism, unspecified: Secondary | ICD-10-CM

## 2017-03-17 DIAGNOSIS — Z0000 Encounter for general adult medical examination without abnormal findings: Secondary | ICD-10-CM | POA: Diagnosis not present

## 2017-03-17 DIAGNOSIS — N183 Chronic kidney disease, stage 3 unspecified: Secondary | ICD-10-CM

## 2017-03-17 DIAGNOSIS — I10 Essential (primary) hypertension: Secondary | ICD-10-CM

## 2017-03-17 DIAGNOSIS — E782 Mixed hyperlipidemia: Secondary | ICD-10-CM | POA: Diagnosis not present

## 2017-03-17 DIAGNOSIS — I129 Hypertensive chronic kidney disease with stage 1 through stage 4 chronic kidney disease, or unspecified chronic kidney disease: Secondary | ICD-10-CM

## 2017-03-17 LAB — COMPREHENSIVE METABOLIC PANEL
ALBUMIN: 4.2 g/dL (ref 3.5–5.2)
ALT: 10 U/L (ref 0–35)
AST: 15 U/L (ref 0–37)
Alkaline Phosphatase: 102 U/L (ref 39–117)
BILIRUBIN TOTAL: 1 mg/dL (ref 0.2–1.2)
BUN: 17 mg/dL (ref 6–23)
CHLORIDE: 106 meq/L (ref 96–112)
CO2: 25 meq/L (ref 19–32)
CREATININE: 1.11 mg/dL (ref 0.40–1.20)
Calcium: 9.9 mg/dL (ref 8.4–10.5)
GFR: 62.26 mL/min (ref >60.00)
Glucose, Bld: 266 mg/dL — ABNORMAL HIGH (ref 70–99)
Potassium: 4.6 mEq/L (ref 3.5–5.1)
SODIUM: 139 meq/L (ref 135–145)
Total Protein: 7.5 g/dL (ref 6.0–8.3)

## 2017-03-17 LAB — VITAMIN D 25 HYDROXY (VIT D DEFICIENCY, FRACTURES): VITD: 37.99 ng/mL (ref 30.00–100.00)

## 2017-03-17 LAB — CBC
HCT: 43.3 % (ref 36.0–46.0)
Hemoglobin: 14.7 g/dL (ref 12.0–15.0)
MCHC: 33.9 g/dL (ref 30.0–36.0)
MCV: 91.5 fl (ref 78.0–100.0)
Platelets: 182 10*3/uL (ref 150.0–400.0)
RBC: 4.73 Mil/uL (ref 3.87–5.11)
RDW: 14.6 % (ref 11.5–15.5)
WBC: 6.3 10*3/uL (ref 4.0–10.5)

## 2017-03-17 LAB — TSH: TSH: 3.48 u[IU]/mL (ref 0.35–4.50)

## 2017-03-17 LAB — VITAMIN B12: Vitamin B-12: 758 pg/mL (ref 211–911)

## 2017-03-17 LAB — HEMOGLOBIN A1C: HEMOGLOBIN A1C: 8.7 % — AB (ref 4.6–6.5)

## 2017-03-17 LAB — LIPID PANEL
CHOL/HDL RATIO: 4
CHOLESTEROL: 129 mg/dL (ref 0–200)
HDL: 33.5 mg/dL — ABNORMAL LOW (ref >39.00)
LDL CALC: 74 mg/dL (ref 0–99)
NonHDL: 95.81
Triglycerides: 110 mg/dL (ref 0.0–149.0)
VLDL: 22 mg/dL (ref 0.0–40.0)

## 2017-03-17 LAB — T4, FREE: FREE T4: 0.81 ng/dL (ref 0.60–1.60)

## 2017-03-17 MED ORDER — LOSARTAN POTASSIUM 100 MG PO TABS: 100.0000 mg | ORAL_TABLET | Freq: Every day | ORAL | 3 refills | Status: DC

## 2017-03-17 NOTE — Progress Notes (Signed)
   Subjective:    Patient ID: Jade Sullivan, female    DOB: May 21, 1945, 71 y.o.   MRN: [id number]  HPI Here for medicare wellness and physical, no new complaints. Please see A/P for status and treatment of chronic medical problems.   Diet: heart healthy Physical activity: water aerobics Depression/mood screen: negative Hearing: intact to whispered voice Visual acuity: grossly normal, performs annual eye exam  ADLs: capable Fall risk: none Home safety: good Cognitive evaluation: intact to orientation, naming, recall and repetition EOL planning: adv directives discussed  I have personally reviewed and have noted 1. The patient's medical and social history - reviewed today no changes 2. Their use of alcohol, tobacco or illicit drugs 3. Their current medications and supplements 4. The patient's functional ability including ADL's, fall risks, home safety risks and hearing or visual impairment. 5. Diet and physical activities 6. Evidence for depression or mood disorders 7. Care team reviewed and updated (available in snapshot)  Review of Systems  Constitutional: Negative.   HENT: Negative.   Eyes: Negative.   Respiratory: Negative for cough, chest tightness and shortness of breath.   Cardiovascular: Negative for chest pain, palpitations and leg swelling.  Gastrointestinal: Negative for abdominal distention, abdominal pain, constipation, diarrhea, nausea and vomiting.  Musculoskeletal: Negative.   Skin: Negative.   Neurological: Negative.   Psychiatric/Behavioral: Negative.       Objective:   Physical Exam  Constitutional: She is oriented to person, place, and time. She appears well-developed and well-nourished.  HENT:  Head: Normocephalic and atraumatic.  Eyes: EOM are normal.  Neck: Normal range of motion.  Cardiovascular: Normal rate and regular rhythm.  Pulmonary/Chest: Effort normal and breath sounds normal. No respiratory distress. She has no wheezes. She has no rales.   Abdominal: Soft. Bowel sounds are normal. She exhibits no distension. There is no tenderness. There is no rebound.  Musculoskeletal: She exhibits no edema.  Neurological: She is alert and oriented to person, place, and time. Coordination normal.  Skin: Skin is warm and dry.  Psychiatric: She has a normal mood and affect.   Vitals:   03/17/17 1057 03/17/17 1140  BP: (!) 150/100 140/90  Pulse: 70   Temp: 98.5 F (36.9 C)   TempSrc: Oral   SpO2: 100%   Weight: 231 lb (104.8 kg)   Height: 5' 4.5" (1.638 m)       Assessment & Plan:  Flu shot given at visit

## 2017-03-17 NOTE — Assessment & Plan Note (Signed)
Taking lipitor, checking lipid panel and adjust as needed.  

## 2017-03-17 NOTE — Assessment & Plan Note (Signed)
Checking HgA1c and adjust as needed. No history of diabetes.

## 2017-03-17 NOTE — Assessment & Plan Note (Signed)
Sees nephrology. Checking CMP today. BP at goal but borderline high and usually is in the office with better readings at home.

## 2017-03-17 NOTE — Assessment & Plan Note (Signed)
Colonoscopy up to date, flu shot today. Pneumonia and tetanus up to date. Counseled about shingrix. Mammogram up to date. Counseled about sun safety and mole surveillance. Given 10 year screening recommendations.

## 2017-03-17 NOTE — Assessment & Plan Note (Signed)
Checking TSH and free T4 today as she did not follow up with labs after last visit. Taking 50 mcg daily synthroid.

## 2017-03-17 NOTE — Assessment & Plan Note (Signed)
Taking clonidine, losartan, bystolic, spironolactone. BP at goal but borderline. Checking CMP and adjust as needed.

## 2017-03-17 NOTE — Patient Instructions (Signed)
We are checking the blood work today and will call you back about the results.   Health Maintenance, Female Adopting a healthy lifestyle and getting preventive care can go a long way to promote health and wellness. Talk with your health care provider about what schedule of regular examinations is right for you. This is a good chance for you to check in with your provider about disease prevention and staying healthy. In between checkups, there are plenty of things you can do on your own. Experts have done a lot of research about which lifestyle changes and preventive measures are most likely to keep you healthy. Ask your health care provider for more information. Weight and diet Eat a healthy diet  Be sure to include plenty of vegetables, fruits, low-fat dairy products, and lean protein.  Do not eat a lot of foods high in solid fats, added sugars, or salt.  Get regular exercise. This is one of the most important things you can do for your health. ? Most adults should exercise for at least 150 minutes each week. The exercise should increase your heart rate and make you sweat (moderate-intensity exercise). ? Most adults should also do strengthening exercises at least twice a week. This is in addition to the moderate-intensity exercise.  Maintain a healthy weight  Body mass index (BMI) is a measurement that can be used to identify possible weight problems. It estimates body fat based on height and weight. Your health care provider can help determine your BMI and help you achieve or maintain a healthy weight.  For females 34 years of age and older: ? A BMI below 18.5 is considered underweight. ? A BMI of 18.5 to 24.9 is normal. ? A BMI of 25 to 29.9 is considered overweight. ? A BMI of 30 and above is considered obese.  Watch levels of cholesterol and blood lipids  You should start having your blood tested for lipids and cholesterol at 71 years of age, then have this test every 5 years.  You  may need to have your cholesterol levels checked more often if: ? Your lipid or cholesterol levels are high. ? You are older than 71 years of age. ? You are at high risk for heart disease.  Cancer screening Lung Cancer  Lung cancer screening is recommended for adults 68-106 years old who are at high risk for lung cancer because of a history of smoking.  A yearly low-dose CT scan of the lungs is recommended for people who: ? Currently smoke. ? Have quit within the past 15 years. ? Have at least a 30-pack-year history of smoking. A pack year is smoking an average of one pack of cigarettes a day for 1 year.  Yearly screening should continue until it has been 15 years since you quit.  Yearly screening should stop if you develop a health problem that would prevent you from having lung cancer treatment.  Breast Cancer  Practice breast self-awareness. This means understanding how your breasts normally appear and feel.  It also means doing regular breast self-exams. Let your health care provider know about any changes, no matter how small.  If you are in your 20s or 30s, you should have a clinical breast exam (CBE) by a health care provider every 1-3 years as part of a regular health exam.  If you are 35 or older, have a CBE every year. Also consider having a breast X-ray (mammogram) every year.  If you have a family history of breast  cancer, talk to your health care provider about genetic screening.  If you are at high risk for breast cancer, talk to your health care provider about having an MRI and a mammogram every year.  Breast cancer gene (BRCA) assessment is recommended for women who have family members with BRCA-related cancers. BRCA-related cancers include: ? Breast. ? Ovarian. ? Tubal. ? Peritoneal cancers.  Results of the assessment will determine the need for genetic counseling and BRCA1 and BRCA2 testing.  Cervical Cancer Your health care provider may recommend that you  be screened regularly for cancer of the pelvic organs (ovaries, uterus, and vagina). This screening involves a pelvic examination, including checking for microscopic changes to the surface of your cervix (Pap test). You may be encouraged to have this screening done every 3 years, beginning at age 48.  For women ages 45-65, health care providers may recommend pelvic exams and Pap testing every 3 years, or they may recommend the Pap and pelvic exam, combined with testing for human papilloma virus (HPV), every 5 years. Some types of HPV increase your risk of cervical cancer. Testing for HPV may also be done on women of any age with unclear Pap test results.  Other health care providers may not recommend any screening for nonpregnant women who are considered low risk for pelvic cancer and who do not have symptoms. Ask your health care provider if a screening pelvic exam is right for you.  If you have had past treatment for cervical cancer or a condition that could lead to cancer, you need Pap tests and screening for cancer for at least 20 years after your treatment. If Pap tests have been discontinued, your risk factors (such as having a new sexual partner) need to be reassessed to determine if screening should resume. Some women have medical problems that increase the chance of getting cervical cancer. In these cases, your health care provider may recommend more frequent screening and Pap tests.  Colorectal Cancer  This type of cancer can be detected and often prevented.  Routine colorectal cancer screening usually begins at 71 years of age and continues through 71 years of age.  Your health care provider may recommend screening at an earlier age if you have risk factors for colon cancer.  Your health care provider may also recommend using home test kits to check for hidden blood in the stool.  A small camera at the end of a tube can be used to examine your colon directly (sigmoidoscopy or  colonoscopy). This is done to check for the earliest forms of colorectal cancer.  Routine screening usually begins at age 38.  Direct examination of the colon should be repeated every 5-10 years through 71 years of age. However, you may need to be screened more often if early forms of precancerous polyps or small growths are found.  Skin Cancer  Check your skin from head to toe regularly.  Tell your health care provider about any new moles or changes in moles, especially if there is a change in a mole's shape or color.  Also tell your health care provider if you have a mole that is larger than the size of a pencil eraser.  Always use sunscreen. Apply sunscreen liberally and repeatedly throughout the day.  Protect yourself by wearing long sleeves, pants, a wide-brimmed hat, and sunglasses whenever you are outside.  Heart disease, diabetes, and high blood pressure  High blood pressure causes heart disease and increases the risk of stroke. High blood pressure  is more likely to develop in: ? People who have blood pressure in the high end of the normal range (130-139/85-89 mm Hg). ? People who are overweight or obese. ? People who are African American.  If you are 71-36 years of age, have your blood pressure checked every 3-5 years. If you are 64 years of age or older, have your blood pressure checked every year. You should have your blood pressure measured twice-once when you are at a hospital or clinic, and once when you are not at a hospital or clinic. Record the average of the two measurements. To check your blood pressure when you are not at a hospital or clinic, you can use: ? An automated blood pressure machine at a pharmacy. ? A home blood pressure monitor.  If you are between 63 years and 57 years old, ask your health care provider if you should take aspirin to prevent strokes.  Have regular diabetes screenings. This involves taking a blood sample to check your fasting blood sugar  level. ? If you are at a normal weight and have a low risk for diabetes, have this test once every three years after 71 years of age. ? If you are overweight and have a high risk for diabetes, consider being tested at a younger age or more often. Preventing infection Hepatitis B  If you have a higher risk for hepatitis B, you should be screened for this virus. You are considered at high risk for hepatitis B if: ? You were born in a country where hepatitis B is common. Ask your health care provider which countries are considered high risk. ? Your parents were born in a high-risk country, and you have not been immunized against hepatitis B (hepatitis B vaccine). ? You have HIV or AIDS. ? You use needles to inject street drugs. ? You live with someone who has hepatitis B. ? You have had sex with someone who has hepatitis B. ? You get hemodialysis treatment. ? You take certain medicines for conditions, including cancer, organ transplantation, and autoimmune conditions.  Hepatitis C  Blood testing is recommended for: ? Everyone born from [birth year] through 1965. ? Anyone with known risk factors for hepatitis C.  Sexually transmitted infections (STIs)  You should be screened for sexually transmitted infections (STIs) including gonorrhea and chlamydia if: ? You are sexually active and are younger than 71 years of age. ? You are older than 71 years of age and your health care provider tells you that you are at risk for this type of infection. ? Your sexual activity has changed since you were last screened and you are at an increased risk for chlamydia or gonorrhea. Ask your health care provider if you are at risk.  If you do not have HIV, but are at risk, it may be recommended that you take a prescription medicine daily to prevent HIV infection. This is called pre-exposure prophylaxis (PrEP). You are considered at risk if: ? You are sexually active and do not regularly use condoms or know the HIV  status of your partner(s). ? You take drugs by injection. ? You are sexually active with a partner who has HIV.  Talk with your health care provider about whether you are at high risk of being infected with HIV. If you choose to begin PrEP, you should first be tested for HIV. You should then be tested every 3 months for as long as you are taking PrEP. Pregnancy  If you are premenopausal and  you may become pregnant, ask your health care provider about preconception counseling.  If you may become pregnant, take 400 to 800 micrograms (mcg) of folic acid every day.  If you want to prevent pregnancy, talk to your health care provider about birth control (contraception). Osteoporosis and menopause  Osteoporosis is a disease in which the bones lose minerals and strength with aging. This can result in serious bone fractures. Your risk for osteoporosis can be identified using a bone density scan.  If you are 14 years of age or older, or if you are at risk for osteoporosis and fractures, ask your health care provider if you should be screened.  Ask your health care provider whether you should take a calcium or vitamin D supplement to lower your risk for osteoporosis.  Menopause may have certain physical symptoms and risks.  Hormone replacement therapy may reduce some of these symptoms and risks. Talk to your health care provider about whether hormone replacement therapy is right for you. Follow these instructions at home:  Schedule regular health, dental, and eye exams.  Stay current with your immunizations.  Do not use any tobacco products including cigarettes, chewing tobacco, or electronic cigarettes.  If you are pregnant, do not drink alcohol.  If you are breastfeeding, limit how much and how often you drink alcohol.  Limit alcohol intake to no more than 1 drink per day for nonpregnant women. One drink equals 12 ounces of beer, 5 ounces of wine, or 1 ounces of hard liquor.  Do not  use street drugs.  Do not share needles.  Ask your health care provider for help if you need support or information about quitting drugs.  Tell your health care provider if you often feel depressed.  Tell your health care provider if you have ever been abused or do not feel safe at home. This information is not intended to replace advice given to you by your health care provider. Make sure you discuss any questions you have with your health care provider. Document Released: 10/01/2010 Document Revised: 08/24/2015 Document Reviewed: 12/20/2014 Elsevier Interactive Patient Education  [employer].

## 2017-03-18 LAB — HEPATITIS C ANTIBODY
HEP C AB: NONREACTIVE
SIGNAL TO CUT-OFF: 0.11 (ref <1.00)

## 2017-05-05 ENCOUNTER — Other Ambulatory Visit: Payer: Self-pay | Admitting: Internal Medicine

## 2017-05-15 ENCOUNTER — Telehealth: Payer: Self-pay | Admitting: Internal Medicine

## 2017-05-15 NOTE — Telephone Encounter (Signed)
Copied from [place] [id number]. Topic: Quick Communication - Rx Refill/Question >> May 15, 2017  2:24 PM Boyd Kerbs wrote: Medication:  levothyroxine   - [facility] is saying they do not have this drug.. They sent a message last week and again today with no response.    Has the patient contacted their pharmacy? Yes.     (Agent: If no, request that the patient contact the pharmacy for the refill.)   Preferred Pharmacy (with phone number or street name): [place], [place] - [address] [place] [address] Phone: [phone] Fax: [phone]     Agent: Please be advised that RX refills may take up to 3 business days. We ask that you follow-up with your pharmacy.

## 2017-05-16 NOTE — Telephone Encounter (Signed)
Called pharmacy back to get clarification of msg below. Spoke w/Leslie she states they no longer uses the previous manufacturer, and needing verbal to change to the [employer].Hanley Seamen verbal to change.Marland KitchenJohny Chess

## 2017-07-29 ENCOUNTER — Other Ambulatory Visit: Payer: Self-pay | Admitting: Internal Medicine

## 2017-08-02 ENCOUNTER — Ambulatory Visit (HOSPITAL_COMMUNITY)
Admission: EM | Admit: 2017-08-02 | Discharge: 2017-08-02 | Disposition: A | Discharge status: Home / Self Care | Payer: Medicare Other | Attending: Family Medicine | Admitting: Family Medicine

## 2017-08-02 ENCOUNTER — Encounter (HOSPITAL_COMMUNITY): Payer: Self-pay | Admitting: Emergency Medicine

## 2017-08-02 ENCOUNTER — Other Ambulatory Visit: Payer: Self-pay

## 2017-08-02 DIAGNOSIS — R062 Wheezing: Secondary | ICD-10-CM | POA: Diagnosis not present

## 2017-08-02 DIAGNOSIS — R05 Cough: Secondary | ICD-10-CM

## 2017-08-02 DIAGNOSIS — I10 Essential (primary) hypertension: Secondary | ICD-10-CM

## 2017-08-02 DIAGNOSIS — J4521 Mild intermittent asthma with (acute) exacerbation: Secondary | ICD-10-CM

## 2017-08-02 MED ORDER — IPRATROPIUM-ALBUTEROL 0.5-2.5 (3) MG/3ML IN SOLN
RESPIRATORY_TRACT | Status: AC
  Filled 2017-08-02: qty 3

## 2017-08-02 MED ORDER — PREDNISONE 20 MG PO TABS: ORAL_TABLET | ORAL | 0 refills | Status: DC

## 2017-08-02 MED ORDER — ALBUTEROL SULFATE HFA 108 (90 BASE) MCG/ACT IN AERS: 2.0000 | INHALATION_SPRAY | RESPIRATORY_TRACT | 1 refills | Status: DC | PRN

## 2017-08-02 MED ORDER — IPRATROPIUM-ALBUTEROL 0.5-2.5 (3) MG/3ML IN SOLN
3.0000 mL | Freq: Once | RESPIRATORY_TRACT | Status: AC
  Administered 2017-08-02: 3 mL via RESPIRATORY_TRACT

## 2017-08-02 NOTE — ED Triage Notes (Signed)
Coughing and wheezing for a week

## 2017-08-02 NOTE — Discharge Instructions (Addendum)
Make sure you take your blood pressure medicine every day

## 2017-08-02 NOTE — ED Provider Notes (Signed)
[facility]   [id number] 08/02/17 Arrival Time: 1506   SUBJECTIVE:  Jade Sullivan is a 72 y.o. female who presents to the urgent care with complaint of Coughing and wheezing for a week.  Patient has had asthma in the past.  Patient denies fever, abdominal pain or nausea, ear pain, throat pain    Past Medical History:  Diagnosis Date  . Allergy    seasonal  . Arthritis    knees  . Chronic kidney disease   . Diverticulosis of colon (without mention of hemorrhage)   . Dysmetabolic syndrome X   . Gout   . H/O hyperthyroidism    By patient h/o hyperthyroidism with protosis, s/p subtotal thyroidectomy. On no replacement therapy. Last thyroid functions 2013 in [place]  Plan Will need thyroid functions Feb '15 with recommendations to follow.    . Heart murmur    yeras ago told had murmur  . Hyperlipidemia   . Hypertension    on multiple meds  . Hypothyroidism    Family History  Problem Relation Age of Onset  . Diabetes Mother        DM2  . Hypertension Mother   . Pneumonia Mother        died from this condition  . Colon polyps Mother   . Heart disease Father        died from this condition  . Colon polyps Father   . Kidney disease Sister        HBP and DM2; relative died from this condition  . Heart disease Brother        MI - HBP and DM2  . Colon cancer Neg Hx   . Esophageal cancer Neg Hx   . Rectal cancer Neg Hx   . Stomach cancer Neg Hx    Social History   Socioeconomic History  . Marital status: Divorced    Spouse name: Not on file  . Number of children: Not on file  . Years of education: Not on file  . Highest education level: Not on file  Occupational History  . Occupation: Retired  Scientific laboratory technician  . Financial resource strain: Not on file  . Food insecurity:    Worry: Not on file    Inability: Not on file  . Transportation needs:    Medical: Not on file    Non-medical: Not on file  Tobacco Use  . Smoking status: Former Research scientist (life sciences)  .  Smokeless tobacco: Never Used  Substance and Sexual Activity  . Alcohol use: No    Alcohol/week: 0.0 oz  . Drug use: No  . Sexual activity: Never    Comment: 1st intercourse- 76, partners- 89, divorced  Lifestyle  . Physical activity:    Days per week: Not on file    Minutes per session: Not on file  . Stress: Not on file  Relationships  . Social connections:    Talks on phone: Not on file    Gets together: Not on file    Attends religious service: Not on file    Active member of club or organization: Not on file    Attends meetings of clubs or organizations: Not on file    Relationship status: Not on file  . Intimate partner violence:    Fear of current or ex partner: Not on file    Emotionally abused: Not on file    Physically abused: Not on file    Forced sexual activity: Not on file  Other Topics  Concern  . Not on file  Social History Narrative   Divorced   Retired - Engineer, maintenance (IT)   No outpatient medications have been marked as taking for the 08/02/17 encounter [facility]).   Allergies  Allergen Reactions  . Amlodipine     Gingival hyperplasia  . Hctz [Hydrochlorothiazide]     States caused her to have gout      ROS: As per HPI, remainder of ROS negative.   OBJECTIVE:   Vitals:   08/02/17 1519  BP: (!) 196/111  Pulse: 78  Resp: (!) 28  Temp: 98.1 F (36.7 C)  TempSrc: Oral  SpO2: 97%     General appearance: alert; no distress Eyes: PERRL; EOMI; conjunctiva injected bilaterally HENT: normocephalic; atraumatic; TMs normal, canal normal, external ears normal without trauma; nasal mucosa normal; oral mucosa normal Neck: supple Lungs: Wheezes on auscultation bilaterally Heart: regular rate and rhythm Back: no CVA tenderness Extremities: no cyanosis or edema; symmetrical with no gross deformities Skin: warm and dry Neurologic: normal gait; grossly normal Psychological: alert and cooperative; normal mood and  affect      Labs:  Results for orders placed or performed in visit on 03/17/17  T4, free  Result Value Ref Range   Free T4 0.81 0.60 - 1.60 ng/dL  VITAMIN D 25 Hydroxy (Vit-D Deficiency, Fractures)  Result Value Ref Range   VITD 37.99 30.00 - 100.00 ng/mL  Vitamin B12  Result Value Ref Range   Vitamin B-12 758 211 - 911 pg/mL  TSH  Result Value Ref Range   TSH 3.48 0.35 - 4.50 uIU/mL  Comprehensive metabolic panel  Result Value Ref Range   Sodium 139 135 - 145 mEq/L   Potassium 4.6 3.5 - 5.1 mEq/L   Chloride 106 96 - 112 mEq/L   CO2 25 19 - 32 mEq/L   Glucose, Bld 266 (H) 70 - 99 mg/dL   BUN 17 6 - 23 mg/dL   Creatinine, Ser 1.11 0.40 - 1.20 mg/dL   Total Bilirubin 1.0 0.2 - 1.2 mg/dL   Alkaline Phosphatase 102 39 - 117 U/L   AST 15 0 - 37 U/L   ALT 10 0 - 35 U/L   Total Protein 7.5 6.0 - 8.3 g/dL   Albumin 4.2 3.5 - 5.2 g/dL   Calcium 9.9 8.4 - 10.5 mg/dL   GFR 62.26 >60.00 mL/min  Lipid panel  Result Value Ref Range   Cholesterol 129 0 - 200 mg/dL   Triglycerides 110.0 0.0 - 149.0 mg/dL   HDL 33.50 (L) >39.00 mg/dL   VLDL 22.0 0.0 - 40.0 mg/dL   LDL Cholesterol 74 0 - 99 mg/dL   Total CHOL/HDL Ratio 4    NonHDL 95.81   Hemoglobin A1c  Result Value Ref Range   Hgb A1c MFr Bld 8.7 (H) 4.6 - 6.5 %  Hepatitis C antibody  Result Value Ref Range   Hepatitis C Ab NON-REACTIVE NON-REACTI   SIGNAL TO CUT-OFF 0.11 <1.00  CBC  Result Value Ref Range   WBC 6.3 4.0 - 10.5 K/uL   RBC 4.73 3.87 - 5.11 Mil/uL   Platelets 182.0 150.0 - 400.0 K/uL   Hemoglobin 14.7 12.0 - 15.0 g/dL   HCT 43.3 36.0 - 46.0 %   MCV 91.5 78.0 - 100.0 fl   MCHC 33.9 30.0 - 36.0 g/dL   RDW 14.6 11.5 - 15.5 %    Labs Reviewed - No data to display  No results found.  ASSESSMENT & PLAN:  1. Mild intermittent asthma with acute exacerbation   2. Essential hypertension    Make sure you take your blood pressure medicine every day Meds ordered this encounter  Medications  .  ipratropium-albuterol (DUONEB) 0.5-2.5 (3) MG/3ML nebulizer solution 3 mL  . predniSONE (DELTASONE) 20 MG tablet    Sig: Two daily with food    Dispense:  10 tablet    Refill:  0  . albuterol (PROVENTIL HFA;VENTOLIN HFA) 108 (90 Base) MCG/ACT inhaler    Sig: Inhale 2 puffs into the lungs every 4 (four) hours as needed for wheezing or shortness of breath (cough, shortness of breath or wheezing.).    Dispense:  1 Inhaler    Refill:  1    Reviewed expectations re: course of current medical issues. Questions answered. Outlined signs and symptoms indicating need for more acute intervention. Patient verbalized understanding. After Visit Summary given.     This is a list of the screening recommended for you and due dates:  Health Maintenance  Topic Date Due  . Flu Shot  10/30/2017  . Mammogram  02/18/2019  . Colon Cancer Screening  03/05/2020  . Tetanus Vaccine  09/07/2023  . DEXA scan (bone density measurement)  Completed  .  Hepatitis C: One time screening is recommended by Center for Disease Control  (CDC) for  adults born from [birth year] through 1965.   Completed  . Pneumonia vaccines  Completed      Robyn Haber, MD 08/02/17 [phone]

## 2017-08-21 NOTE — Progress Notes (Signed)
[facility] [address], [place] [address] Phone: [phone] Subjective:     CC: Bilateral knee pain  RDE:[id number]  Jade Sullivan is a 72 y.o. female coming in with complaint of bilateral knee pain.  Known to have severe [place] arthritic changes of the knees.  Has responded well to injections.  Last injections were August 2018.  Patient states some increasing discomfort again and pain.  Patient states affecting daily activities.  Some increasing instability.  Waking her up at night      Past Medical History:  Diagnosis Date  . Allergy    seasonal  . Arthritis    knees  . Chronic kidney disease   . Diverticulosis of colon (without mention of hemorrhage)   . Dysmetabolic syndrome X   . Gout   . H/O hyperthyroidism    By patient h/o hyperthyroidism with protosis, s/p subtotal thyroidectomy. On no replacement therapy. Last thyroid functions 2013 in [place]  Plan Will need thyroid functions Feb '15 with recommendations to follow.    . Heart murmur    yeras ago told had murmur  . Hyperlipidemia   . Hypertension    on multiple meds  . Hypothyroidism    Past Surgical History:  Procedure Laterality Date  . [place]  . COLONOSCOPY    . HERNIA REPAIR  2000  . LIPOMA EXCISION Right 11/24/2014   Procedure: EXCISION RIGHT SHOULDER LIPOMA;  Surgeon: Erroll Luna, MD;  Location: [facility];  Service: General;  Laterality: Right;  . POLYPECTOMY    . THYROIDECTOMY, PARTIAL  1981   Social History   Socioeconomic History  . Marital status: Divorced    Spouse name: Not on file  . Number of children: Not on file  . Years of education: Not on file  . Highest education level: Not on file  Occupational History  . Occupation: Retired  Scientific laboratory technician  . Financial resource strain: Not on file  . Food insecurity:    Worry: Not on file    Inability: Not on file  . Transportation needs:    Medical: Not on file   Non-medical: Not on file  Tobacco Use  . Smoking status: Former Research scientist (life sciences)  . Smokeless tobacco: Never Used  Substance and Sexual Activity  . Alcohol use: No    Alcohol/week: 0.0 oz  . Drug use: No  . Sexual activity: Never    Comment: 1st intercourse- 60, partners- 61, divorced  Lifestyle  . Physical activity:    Days per week: Not on file    Minutes per session: Not on file  . Stress: Not on file  Relationships  . Social connections:    Talks on phone: Not on file    Gets together: Not on file    Attends religious service: Not on file    Active member of club or organization: Not on file    Attends meetings of clubs or organizations: Not on file    Relationship status: Not on file  Other Topics Concern  . Not on file  Social History Narrative   Divorced   Retired - Engineer, maintenance (IT)   Allergies  Allergen Reactions  . Amlodipine     Gingival hyperplasia  . Hctz [Hydrochlorothiazide]     States caused her to have gout   Family History  Problem Relation Age of Onset  . Diabetes Mother        DM2  . Hypertension Mother   .  Pneumonia Mother        died from this condition  . Colon polyps Mother   . Heart disease Father        died from this condition  . Colon polyps Father   . Kidney disease Sister        HBP and DM2; relative died from this condition  . Heart disease Brother        MI - HBP and DM2  . Colon cancer Neg Hx   . Esophageal cancer Neg Hx   . Rectal cancer Neg Hx   . Stomach cancer Neg Hx      Past medical history, social, surgical and family history all reviewed in electronic medical record.  No pertanent information unless stated regarding to the chief complaint.   Review of Systems:Review of systems updated and as accurate as of 08/22/17  No headache, visual changes, nausea, vomiting, diarrhea, constipation, dizziness, abdominal pain, skin rash, fevers, chills, night sweats, weight loss, swollen lymph nodes, body aches,  chest pain, shortness of  breath, mood changes.  Positive muscle aches and joint swelling  Objective  Blood pressure 136/90, height 5\' 4"  (1.626 m), weight 215 lb (97.5 kg). Systems examined below as of 08/22/17   General: No apparent distress alert and oriented x3 mood and affect normal, dressed appropriately.  HEENT: Pupils equal, extraocular movements intact  Respiratory: Patient's speak in full sentences and does not appear short of breath  Cardiovascular: No lower extremity edema, non tender, no erythema  Skin: Warm dry intact with no signs of infection or rash on extremities or on axial skeleton.  Abdomen: Soft nontender  Neuro: Cranial nerves II through XII are intact, neurovascularly intact in all extremities with 2+ DTRs and 2+ pulses.  Lymph: No lymphadenopathy of posterior or anterior cervical chain or axillae bilaterally.  Gait antalgic MSK:  Non tender with full range of motion and good stability and symmetric strength and tone of shoulders, elbows, wrist, hip, and ankles bilaterally.  Knee: Bilateral valgus deformity noted. Large thigh to calf ratio.  Tender to palpation over medial and PF joint line.  ROM full in flexion and extension and lower leg rotation. instability with valgus force.  painful patellar compression. Patellar glide with moderate crepitus. Patellar and quadriceps tendons unremarkable. Hamstring and quadriceps strength is normal.  After informed written and verbal consent, patient was seated on exam table. Right knee was prepped with alcohol swab and utilizing anterolateral approach, patient's right knee space was injected with 4:1  marcaine 0.5%: Kenalog 40mg /dL. Patient tolerated the procedure well without immediate complications.  After informed written and verbal consent, patient was seated on exam table. Left knee was prepped with alcohol swab and utilizing anterolateral approach, patient's left knee space was injected with 4:1  marcaine 0.5%: Kenalog 40mg /dL. Patient tolerated  the procedure well without immediate complications.    Impression and Recommendations:     This case required medical decision making of moderate complexity.      Note: This dictation was prepared with Dragon dictation along with smaller phrase technology. Any transcriptional errors that result from this process are unintentional.

## 2017-08-22 ENCOUNTER — Ambulatory Visit: Payer: Medicare Other | Admitting: Family Medicine

## 2017-08-22 ENCOUNTER — Encounter: Payer: Self-pay | Admitting: Family Medicine

## 2017-08-22 DIAGNOSIS — M170 Bilateral primary osteoarthritis of knee: Secondary | ICD-10-CM | POA: Diagnosis not present

## 2017-08-22 NOTE — Assessment & Plan Note (Signed)
Patient was given bilateral knee injections today.  Tolerated the procedure well.  Encourage patient continue to work on the weight loss.  Has lost 20 pounds since last visit.  Discussed icing regimen and topical anti-inflammatories.  We discussed avoiding certain high impact exercises.  As long as patient is welcome follow-up with me in 3 months.

## 2017-08-22 NOTE — Patient Instructions (Signed)
Good to see you  Injected both knees  pennsaid pinkie amount topically 2 times daily as needed.  Ice 20 minutes 2 times daily. Usually after activity and before bed. You know the drill  Can repeat every 3 months if needed See me again when you need me Enjoy the convention

## 2017-11-11 LAB — HEMOGLOBIN A1C: Hgb A1c MFr Bld: 7 — AB (ref 4.0–6.0)

## 2017-11-11 LAB — TSH: TSH: 3.44 (ref 0.41–5.90)

## 2017-11-11 LAB — CBC AND DIFFERENTIAL
HCT: 39 (ref 36–46)
Hemoglobin: 13.4 (ref 12.0–16.0)
NEUTROS ABS: 3
PLATELETS: 188 (ref 150–399)
WBC: 4.8

## 2017-11-11 LAB — BASIC METABOLIC PANEL
BUN: 15 (ref 4–21)
Creatinine: 1.1 (ref 0.5–1.1)
GLUCOSE: 174
Potassium: 4.6 (ref 3.4–5.3)
Sodium: 139 (ref 137–147)

## 2017-11-11 LAB — HEPATIC FUNCTION PANEL
ALT: 9 (ref 7–35)
AST: 13 (ref 13–35)
Alkaline Phosphatase: 88 (ref 25–125)
BILIRUBIN, TOTAL: 0.9

## 2017-11-11 LAB — VITAMIN D 25 HYDROXY (VIT D DEFICIENCY, FRACTURES): VIT D 25 HYDROXY: 41.4

## 2017-11-14 ENCOUNTER — Encounter: Payer: Self-pay | Admitting: Internal Medicine

## 2018-02-12 ENCOUNTER — Other Ambulatory Visit: Payer: Self-pay | Admitting: Internal Medicine

## 2018-02-12 DIAGNOSIS — Z1231 Encounter for screening mammogram for malignant neoplasm of breast: Secondary | ICD-10-CM

## 2018-02-17 ENCOUNTER — Other Ambulatory Visit: Payer: Self-pay | Admitting: Internal Medicine

## 2018-03-26 ENCOUNTER — Ambulatory Visit
Admission: RE | Admit: 2018-03-26 | Discharge: 2018-03-26 | Disposition: A | Discharge status: Home / Self Care | Payer: Medicare Other | Source: Ambulatory Visit | Attending: Internal Medicine | Admitting: Internal Medicine

## 2018-03-26 DIAGNOSIS — Z1231 Encounter for screening mammogram for malignant neoplasm of breast: Secondary | ICD-10-CM

## 2018-04-02 ENCOUNTER — Encounter: Payer: Self-pay | Admitting: Obstetrics & Gynecology

## 2018-04-02 ENCOUNTER — Ambulatory Visit: Payer: Medicare Other | Admitting: Obstetrics & Gynecology

## 2018-04-02 VITALS — BP 140/92 | Ht 63.75 in | Wt 213.0 lb

## 2018-04-02 DIAGNOSIS — E6609 Other obesity due to excess calories: Secondary | ICD-10-CM

## 2018-04-02 DIAGNOSIS — Z780 Asymptomatic menopausal state: Secondary | ICD-10-CM | POA: Diagnosis not present

## 2018-04-02 DIAGNOSIS — Z124 Encounter for screening for malignant neoplasm of cervix: Secondary | ICD-10-CM | POA: Diagnosis not present

## 2018-04-02 DIAGNOSIS — Z01419 Encounter for gynecological examination (general) (routine) without abnormal findings: Secondary | ICD-10-CM

## 2018-04-02 DIAGNOSIS — Z6836 Body mass index (BMI) 36.0-36.9, adult: Secondary | ICD-10-CM

## 2018-04-02 NOTE — Progress Notes (Signed)
OLA RAAP 1946/01/01 [id number]   History:    73 y.o. G1P1L1 Divorced.  Son moved out of state for a job.  RP:  Established patient presenting for annual gyn exam   HPI: Menopause, well on no hormone replacement therapy.  No postmenopausal bleeding.  No pelvic pain.  Abstinent.  Urine and bowel movements normal.  Breasts normal.  Body mass index 36.85, but good muscle mass and bone mass.  Exercising regularly with water aerobics and weightlifting.  Healthy nutrition.  Health labs with nephrologist.  Stage II renal disease.  Colonoscopy in 2016.  Past medical history,surgical history, family history and social history were all reviewed and documented in the EPIC chart.  Gynecologic History No LMP recorded. Patient is postmenopausal. Contraception: abstinence and post menopausal status Last Pap: 04/2016. Results were: Negative.  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 Last mammogram: 03/26/2018. Results were: Negative Bone Density: 03/2016 Normal.  Repeat at 5 yrs Colonoscopy: 2016  Obstetric History OB History  Gravida Para Term Preterm AB Living  1 1       1   SAB TAB Ectopic Multiple Live Births               # Outcome Date GA Lbr Len/2nd Weight Sex Delivery Anes PTL Lv  1 Para              ROS: A ROS was performed and pertinent positives and negatives are included in the history.  GENERAL: No fevers or chills. HEENT: No change in vision, no earache, sore throat or sinus congestion. NECK: No pain or stiffness. CARDIOVASCULAR: No chest pain or pressure. No palpitations. PULMONARY: No shortness of breath, cough or wheeze. GASTROINTESTINAL: No abdominal pain, nausea, vomiting or diarrhea, melena or bright red blood per rectum. GENITOURINARY: No urinary frequency, urgency, hesitancy or dysuria. MUSCULOSKELETAL: No joint or muscle pain, no back pain, no recent trauma. DERMATOLOGIC: No rash, no itching, no lesions. ENDOCRINE: No polyuria, polydipsia, no  heat or cold intolerance. No recent change in weight. HEMATOLOGICAL: No anemia or easy bruising or bleeding. NEUROLOGIC: No headache, seizures, numbness, tingling or weakness. PSYCHIATRIC: No depression, no loss of interest in normal activity or change in sleep pattern.     Exam:   BP (!) 140/92   Ht 5' 3.75" (1.619 m)   Wt 213 lb (96.6 kg)   BMI 36.85 kg/m   Body mass index is 36.85 kg/m.  General appearance : Well developed well nourished female. No acute distress HEENT: Eyes: no retinal hemorrhage or exudates,  Neck supple, trachea midline, no carotid bruits, no thyroidmegaly Lungs: Clear to auscultation, no rhonchi or wheezes, or rib retractions  Heart: Regular rate and rhythm, no murmurs or gallops Breast:Examined in sitting and supine position were symmetrical in appearance, no palpable masses or tenderness,  no skin retraction, no nipple inversion, no nipple discharge, no skin discoloration, no axillary or supraclavicular lymphadenopathy Abdomen: no palpable masses or tenderness, no rebound or guarding Extremities: no edema or skin discoloration or tenderness  Pelvic: Vulva: Normal             Vagina: No gross lesions or discharge  Cervix: No gross lesions or discharge.  Pap reflex done  Uterus  AV, normal size, shape and consistency, non-tender and mobile  Adnexa  Without masses or tenderness  Anus: Normal   Assessment/Plan:  73 y.o. female for annual exam   1. Encounter for routine gynecological examination with Papanicolaou smear of cervix Normal gynecologic  exam in menopause.  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  Pap reflex done today.  Breast exam normal.  Screening mammogram March 26, 2018 was negative.  Health labs with nephrologist, followed for stage II renal disease.  Colonoscopy in 2016.  2. Postmenopausal Well on no hormone replacement therapy.  No postmenopausal bleeding.  Bone density in December 2017 was normal.  Will repeat bone  density at 5 years.  Vitamin D supplements, calcium intake of 1.2 g/day and regular weightbearing physical activity recommended.  3. Class 2 obesity due to excess calories without serious comorbidity with body mass index (BMI) of 36.0 to 36.9 in adult Continue with regular physical activity, aerobics 3 times a week and weightlifting.  Recommend aerobic activities 5 times a week and weightlifting every 2 days.  Continue with healthy nutrition, high vegetable and fiber diet.  Recommend decreasing calories/carbs in order to lose weight.  Other orders - latanoprost (XALATAN) 0.005 % ophthalmic solution; 1 drop at bedtime. - cholecalciferol (VITAMIN D3) 25 MCG (1000 UT) tablet; Take 1,000 Units by mouth daily.  Princess Bruins MD, 2:42 PM 04/02/2018

## 2018-04-02 NOTE — Addendum Note (Signed)
Addended by: Thurnell Garbe A on: 04/02/2018 03:35 PM   Modules accepted: Orders

## 2018-04-02 NOTE — Patient Instructions (Signed)
1. Encounter for routine gynecological examination with Papanicolaou smear of cervix Normal gynecologic exam in menopause.  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  Pap reflex done today.  Breast exam normal.  Screening mammogram March 26, 2018 was negative.  Health labs with nephrologist, followed for stage II renal disease.  Colonoscopy in 2016.  2. Postmenopausal Well on no hormone replacement therapy.  No postmenopausal bleeding.  Bone density in December 2017 was normal.  Will repeat bone density at 5 years.  Vitamin D supplements, calcium intake of 1.2 g/day and regular weightbearing physical activity recommended.  3. Class 2 obesity due to excess calories without serious comorbidity with body mass index (BMI) of 36.0 to 36.9 in adult Continue with regular physical activity, aerobics 3 times a week and weightlifting.  Recommend aerobic activities 5 times a week and weightlifting every 2 days.  Continue with healthy nutrition, high vegetable and fiber diet.  Recommend decreasing calories/carbs in order to lose weight.  Other orders - latanoprost (XALATAN) 0.005 % ophthalmic solution; 1 drop at bedtime. - cholecalciferol (VITAMIN D3) 25 MCG (1000 UT) tablet; Take 1,000 Units by mouth daily.  Jade Sullivan, it was a pleasure seeing you today!  I will inform you of your results as soon as they are available.

## 2018-04-04 LAB — PAP IG W/ RFLX HPV ASCU

## 2018-04-04 LAB — HUMAN PAPILLOMAVIRUS, HIGH RISK: HPV DNA High Risk: NOT DETECTED

## 2018-04-27 ENCOUNTER — Telehealth: Payer: Self-pay | Admitting: Internal Medicine

## 2018-04-28 MED ORDER — ATORVASTATIN CALCIUM 20 MG PO TABS: 20.0000 mg | ORAL_TABLET | Freq: Every day | ORAL | 0 refills | Status: DC

## 2018-04-28 NOTE — Addendum Note (Signed)
Addended by: Raford Pitcher R on: 04/28/2018 10:50 AM   Modules accepted: Orders

## 2018-04-28 NOTE — Addendum Note (Signed)
Addended by: Raford Pitcher R on: 04/28/2018 10:49 AM   Modules accepted: Orders

## 2018-04-28 NOTE — Telephone Encounter (Signed)
Pt scheduled for 05/12/2018 for an appt and would like to see if enough meds can be sent in to last until that appt.

## 2018-04-28 NOTE — Telephone Encounter (Signed)
Refill sent.

## 2018-05-11 ENCOUNTER — Telehealth: Payer: Self-pay | Admitting: *Deleted

## 2018-05-11 NOTE — Telephone Encounter (Signed)
Called patient to schedule AWV, patient scheduled on 05/12/18.

## 2018-05-11 NOTE — Progress Notes (Signed)
Subjective:   Jade Sullivan is a 73 y.o. female who presents for Medicare Annual (Subsequent) preventive examination.  Review of Systems:  No ROS.  Medicare Wellness Visit. Additional risk factors are reflected in the social history.  Cardiac Risk Factors include: advanced age (>57men, >35 women);dyslipidemia;hypertension Sleep patterns: feels rested on waking, gets up 1 times nightly to void and sleeps 7-8 hours nightly.    Home Safety/Smoke Alarms: Feels safe in home. Smoke alarms in place.  Living environment; residence and Firearm Safety: 2-story house, no firearms. Lives alone, no needs for DME, good support system Seat Belt Safety/Bike Helmet: Wears seat belt.     Objective:     Vitals: BP 122/84   Pulse (!) 57   Ht 5\' 4"  (1.626 m)   Wt 209 lb (94.8 kg)   SpO2 98%   BMI 35.87 kg/m   Body mass index is 35.87 kg/m.  Advanced Directives 05/12/2018 02/20/2015 11/24/2014 11/18/2014 12/22/2013 12/08/2013  Does Patient Have a Medical Advance Directive? No No No No No No  Would patient like information on creating a medical advance directive? No - Patient declined - No - patient declined information - No - patient declined information No - patient declined information    Tobacco Social History   Tobacco Use  Smoking Status Former Smoker  Smokeless Tobacco Never Used     Counseling given: Not Answered  Past Medical History:  Diagnosis Date  . Allergy    seasonal  . Arthritis    knees  . Chronic kidney disease   . Diverticulosis of colon (without mention of hemorrhage)   . Dysmetabolic syndrome X   . Gout   . H/O hyperthyroidism    By patient h/o hyperthyroidism with protosis, s/p subtotal thyroidectomy. On no replacement therapy. Last thyroid functions 2013 in [place]  Plan Will need thyroid functions Feb '15 with recommendations to follow.    . Heart murmur    yeras ago told had murmur  . Hyperlipidemia   . Hypertension    on multiple meds  . Hypothyroidism     Past Surgical History:  Procedure Laterality Date  . [place]  . COLONOSCOPY    . EYE SURGERY    . HERNIA REPAIR  2000  . LIPOMA EXCISION Right 11/24/2014   Procedure: EXCISION RIGHT SHOULDER LIPOMA;  Surgeon: Erroll Luna, MD;  Location: [facility];  Service: General;  Laterality: Right;  . POLYPECTOMY    . THYROIDECTOMY, PARTIAL  1981   Family History  Problem Relation Age of Onset  . Diabetes Mother        DM2  . Hypertension Mother   . Pneumonia Mother        died from this condition  . Colon polyps Mother   . Heart disease Father        died from this condition  . Colon polyps Father   . Kidney disease Sister        HBP and DM2; relative died from this condition  . Heart disease Brother        MI - HBP and DM2  . Colon cancer Neg Hx   . Esophageal cancer Neg Hx   . Rectal cancer Neg Hx   . Stomach cancer Neg Hx    Social History   Socioeconomic History  . Marital status: Divorced    Spouse name: Not on file  . Number of children: 1  . Years of education: Not on file  .  Highest education level: Not on file  Occupational History  . Occupation: Retired  Scientific laboratory technician  . Financial resource strain: Not hard at all  . Food insecurity:    Worry: Never true    Inability: Never true  . Transportation needs:    Medical: No    Non-medical: No  Tobacco Use  . Smoking status: Former Research scientist (life sciences)  . Smokeless tobacco: Never Used  Substance and Sexual Activity  . Alcohol use: No    Alcohol/week: 0.0 standard drinks  . Drug use: No  . Sexual activity: Never    Comment: 1st intercourse- 36, partners- 32, divorced  Lifestyle  . Physical activity:    Days per week: 4 days    Minutes per session: 50 min  . Stress: Not at all  Relationships  . Social connections:    Talks on phone: More than three times a week    Gets together: More than three times a week    Attends religious service: More than 4 times per year    Active member of club or  organization: Yes    Attends meetings of clubs or organizations: More than 4 times per year    Relationship status: Divorced  Other Topics Concern  . Not on file  Social History Narrative   Divorced   Retired - Engineer, maintenance (IT)    Outpatient Encounter Medications as of 05/12/2018  Medication Sig  . Aspirin-Acetaminophen-Caffeine (EXCEDRIN PO) Take 1 tablet by mouth as needed.  . cholecalciferol (VITAMIN D3) 25 MCG (1000 UT) tablet Take 1,000 Units by mouth daily.  . cloNIDine (CATAPRES - DOSED IN MG/24 HR) 0.3 mg/24hr patch   . cloNIDine (CATAPRES) 0.2 MG tablet Take 0.2 mg by mouth 2 (two) times daily.  . fish oil-omega-3 fatty acids 1000 MG capsule Take 2 g by mouth daily.  Marland Kitchen gabapentin (NEURONTIN) 100 MG capsule Take 2 capsules (200 mg total) by mouth at bedtime.  Marland Kitchen latanoprost (XALATAN) 0.005 % ophthalmic solution 1 drop at bedtime.  Marland Kitchen levothyroxine (SYNTHROID, LEVOTHROID) 50 MCG tablet Take 1 tablet (50 mcg total) by mouth daily before breakfast.  . losartan (COZAAR) 100 MG tablet Take 1 tablet (100 mg total) by mouth daily.  . minoxidil (LONITEN) 2.5 MG tablet Take 2.5 mg by mouth 2 (two) times daily.  . Misc Natural Products (TART CHERRY ADVANCED PO) Take by mouth.  . Multiple Vitamin (MULTIVITAMIN) tablet Take 1 tablet by mouth daily.  . nebivolol (BYSTOLIC) 5 MG tablet Take 5 mg by mouth daily.  Marland Kitchen spironolactone (ALDACTONE) 100 MG tablet Take 100 mg by mouth daily.  . timolol (BETIMOL) 0.25 % ophthalmic solution 1-2 drops 2 (two) times daily.  . [DISCONTINUED] atorvastatin (LIPITOR) 20 MG tablet Take 1 tablet (20 mg total) by mouth daily.  . [DISCONTINUED] levothyroxine (SYNTHROID, LEVOTHROID) 50 MCG tablet TAKE 1 TABLET BY MOUTH ONCE DAILY BEFORE BREAKFAST  . [DISCONTINUED] losartan (COZAAR) 100 MG tablet Take 1 tablet (100 mg total) by mouth daily. Need annual office visit for further refills   No facility-administered encounter medications on file as of 05/12/2018.      Activities of Daily Living In your present state of health, do you have any difficulty performing the following activities: 05/12/2018  Hearing? N  Vision? N  Difficulty concentrating or making decisions? N  Walking or climbing stairs? N  Dressing or bathing? N  Doing errands, shopping? N  Preparing Food and eating ? N  Using the Toilet? N  In the past six  months, have you accidently leaked urine? N  Do you have problems with loss of bowel control? N  Managing your Medications? N  Managing your Finances? N  Housekeeping or managing your Housekeeping? N  Some recent data might be hidden    Patient Care Team: Hoyt Koch, MD as PCP - General (Internal Medicine) Princess Bruins, MD as Consulting Physician (Obstetrics and Gynecology) Hilarie Fredrickson, Lajuan Lines, MD as Consulting Physician (Gastroenterology) Lyndal Pulley, DO as Consulting Physician (Family Medicine)    Assessment:   This is a routine wellness examination for [place]. Physical assessment deferred to PCP.  Exercise Activities and Dietary recommendations Current Exercise Habits: Structured exercise class, Type of exercise: strength training/weights(water aerobics), Time (Minutes): 45, Frequency (Times/Week): 4, Weekly Exercise (Minutes/Week): 180, Intensity: Mild, Exercise limited by: None identified Diet (meal preparation, eat out, water intake, caffeinated beverages, dairy products, fruits and vegetables): in general, a "healthy" diet  , well balanced   Reviewed heart healthy diet. Patient states she is own fluid restriction of 36 oz daily. Discussed weight loss strategies and diet. Relevant patient education assigned to patient using Emmi.  Goals   None     Fall Risk Fall Risk  05/12/2018 05/12/2018 08/24/2015 03/29/2014 10/27/2012  Falls in the past year? 0 0 No No No   Depression Screen PHQ 2/9 Scores 05/12/2018 05/12/2018 03/17/2017 08/24/2015  PHQ - 2 Score 0 0 0 0     Cognitive Function       Ad8  score reviewed for issues:  Issues making decisions: no  Less interest in hobbies / activities: no  Repeats questions, stories (family complaining): no  Trouble using ordinary gadgets (microwave, computer, phone):no  Forgets the month or year: no  Mismanaging finances: no  Remembering appts: no  Daily problems with thinking and/or memory: no Ad8 score is= 0  Immunization History  Administered Date(s) Administered  . Influenza Split 01/22/2012  . Influenza, High Dose Seasonal PF 03/17/2017  . Influenza,inj,Quad PF,6+ Mos 01/21/2016, 01/25/2018  . Influenza-Unspecified 12/30/2012  . Pneumococcal Conjugate-13 08/29/2015  . Pneumococcal Polysaccharide-23 01/22/2012  . Td 09/06/2013   Screening Tests Health Maintenance  Topic Date Due  . COLONOSCOPY  03/05/2020  . MAMMOGRAM  03/26/2020  . TETANUS/TDAP  09/07/2023  . INFLUENZA VACCINE  Completed  . DEXA SCAN  Completed  . Hepatitis C Screening  Completed  . PNA vac Low Risk Adult  Completed      Plan:     Reviewed health maintenance screenings with patient today and relevant education, vaccines, and/or referrals were provided.   Continue doing brain stimulating activities (puzzles, reading, adult coloring books, staying active) to keep memory sharp.   Continue to eat heart healthy diet (full of fruits, vegetables, whole grains, lean protein, water--limit salt, fat, and sugar intake) and increase physical activity as tolerated.  I have personally reviewed and noted the following in the patient's chart:   . Medical and social history . Use of alcohol, tobacco or illicit drugs  . Current medications and supplements . Functional ability and status . Nutritional status . Physical activity . Advanced directives . List of other physicians . Vitals . Screenings to include cognitive, depression, and falls . Referrals and appointments  In addition, I have reviewed and discussed with patient certain preventive protocols,  quality metrics, and best practice recommendations. A written personalized care plan for preventive services as well as general preventive health recommendations were provided to patient.     Michiel Cowboy, RN  05/12/2018

## 2018-05-12 ENCOUNTER — Ambulatory Visit (INDEPENDENT_AMBULATORY_CARE_PROVIDER_SITE_OTHER): Payer: Medicare Other | Admitting: *Deleted

## 2018-05-12 ENCOUNTER — Encounter: Payer: Self-pay | Admitting: Internal Medicine

## 2018-05-12 ENCOUNTER — Other Ambulatory Visit (INDEPENDENT_AMBULATORY_CARE_PROVIDER_SITE_OTHER): Payer: Medicare Other

## 2018-05-12 ENCOUNTER — Ambulatory Visit (INDEPENDENT_AMBULATORY_CARE_PROVIDER_SITE_OTHER): Payer: Medicare Other | Admitting: Internal Medicine

## 2018-05-12 VITALS — BP 122/84 | HR 57 | Ht 64.0 in | Wt 209.0 lb

## 2018-05-12 VITALS — BP 122/84 | HR 57 | Temp 98.6°F | Ht 63.75 in | Wt 209.0 lb

## 2018-05-12 DIAGNOSIS — E782 Mixed hyperlipidemia: Secondary | ICD-10-CM

## 2018-05-12 DIAGNOSIS — Z0000 Encounter for general adult medical examination without abnormal findings: Secondary | ICD-10-CM

## 2018-05-12 DIAGNOSIS — R7301 Impaired fasting glucose: Secondary | ICD-10-CM | POA: Diagnosis not present

## 2018-05-12 DIAGNOSIS — E034 Atrophy of thyroid (acquired): Secondary | ICD-10-CM | POA: Diagnosis not present

## 2018-05-12 DIAGNOSIS — N183 Chronic kidney disease, stage 3 unspecified: Secondary | ICD-10-CM

## 2018-05-12 LAB — CBC
HCT: 40.3 % (ref 36.0–46.0)
HEMOGLOBIN: 13.9 g/dL (ref 12.0–15.0)
MCHC: 34.4 g/dL (ref 30.0–36.0)
MCV: 91.1 fl (ref 78.0–100.0)
Platelets: 168 10*3/uL (ref 150.0–400.0)
RBC: 4.43 Mil/uL (ref 3.87–5.11)
RDW: 14.2 % (ref 11.5–15.5)
WBC: 6.6 10*3/uL (ref 4.0–10.5)

## 2018-05-12 LAB — COMPREHENSIVE METABOLIC PANEL
ALK PHOS: 80 U/L (ref 39–117)
ALT: 9 U/L (ref 0–35)
AST: 15 U/L (ref 0–37)
Albumin: 4.1 g/dL (ref 3.5–5.2)
BUN: 18 mg/dL (ref 6–23)
CO2: 26 mEq/L (ref 19–32)
Calcium: 9.7 mg/dL (ref 8.4–10.5)
Chloride: 107 mEq/L (ref 96–112)
Creatinine, Ser: 1.03 mg/dL (ref 0.40–1.20)
GFR: 63.65 mL/min (ref >60.00)
GLUCOSE: 139 mg/dL — AB (ref 70–99)
Potassium: 4.3 mEq/L (ref 3.5–5.1)
Sodium: 141 mEq/L (ref 135–145)
Total Bilirubin: 0.8 mg/dL (ref 0.2–1.2)
Total Protein: 7 g/dL (ref 6.0–8.3)

## 2018-05-12 LAB — LIPID PANEL
Cholesterol: 126 mg/dL (ref 0–200)
HDL: 34.1 mg/dL — ABNORMAL LOW (ref >39.00)
LDL Cholesterol: 76 mg/dL (ref 0–99)
NonHDL: 92.21
Total CHOL/HDL Ratio: 4
Triglycerides: 79 mg/dL (ref 0.0–149.0)
VLDL: 15.8 mg/dL (ref 0.0–40.0)

## 2018-05-12 LAB — TSH: TSH: 6.55 u[IU]/mL — AB (ref 0.35–4.50)

## 2018-05-12 LAB — T4, FREE: Free T4: 0.8 ng/dL (ref 0.60–1.60)

## 2018-05-12 LAB — HEMOGLOBIN A1C: Hgb A1c MFr Bld: 6.7 % — ABNORMAL HIGH (ref 4.6–6.5)

## 2018-05-12 MED ORDER — ATORVASTATIN CALCIUM 20 MG PO TABS: 20.0000 mg | ORAL_TABLET | Freq: Every day | ORAL | 3 refills | Status: DC

## 2018-05-12 MED ORDER — LOSARTAN POTASSIUM 100 MG PO TABS: 100.0000 mg | ORAL_TABLET | Freq: Every day | ORAL | 3 refills | Status: DC

## 2018-05-12 MED ORDER — LEVOTHYROXINE SODIUM 50 MCG PO TABS: 50.0000 ug | ORAL_TABLET | Freq: Every day | ORAL | 3 refills | Status: DC

## 2018-05-12 NOTE — Assessment & Plan Note (Signed)
Still seeing nephrology and checking renal function.

## 2018-05-12 NOTE — Patient Instructions (Addendum)
Continue doing brain stimulating activities (puzzles, reading, adult coloring books, staying active) to keep memory sharp.   Continue to eat heart healthy diet (full of fruits, vegetables, whole grains, lean protein, water--limit salt, fat, and sugar intake) and increase physical activity as tolerated.  Health Maintenance, Female Adopting a healthy lifestyle and getting preventive care can go a long way to promote health and wellness. Talk with your health care provider about what schedule of regular examinations is right for you. This is a good chance for you to check in with your provider about disease prevention and staying healthy. In between checkups, there are plenty of things you can do on your own. Experts have done a lot of research about which lifestyle changes and preventive measures are most likely to keep you healthy. Ask your health care provider for more information. Weight and diet Eat a healthy diet  Be sure to include plenty of vegetables, fruits, low-fat dairy products, and lean protein.  Do not eat a lot of foods high in solid fats, added sugars, or salt.  Get regular exercise. This is one of the most important things you can do for your health. ? Most adults should exercise for at least 150 minutes each week. The exercise should increase your heart rate and make you sweat (moderate-intensity exercise). ? Most adults should also do strengthening exercises at least twice a week. This is in addition to the moderate-intensity exercise. Maintain a healthy weight  Body mass index (BMI) is a measurement that can be used to identify possible weight problems. It estimates body fat based on height and weight. Your health care provider can help determine your BMI and help you achieve or maintain a healthy weight.  For females 20 years of age and older: ? A BMI below 18.5 is considered underweight. ? A BMI of 18.5 to 24.9 is normal. ? A BMI of 25 to 29.9 is considered  overweight. ? A BMI of 30 and above is considered obese. Watch levels of cholesterol and blood lipids  You should start having your blood tested for lipids and cholesterol at 73 years of age, then have this test every 5 years.  You may need to have your cholesterol levels checked more often if: ? Your lipid or cholesterol levels are high. ? You are older than 73 years of age. ? You are at high risk for heart disease. Cancer screening Lung Cancer  Lung cancer screening is recommended for adults 55-80 years old who are at high risk for lung cancer because of a history of smoking.  A yearly low-dose CT scan of the lungs is recommended for people who: ? Currently smoke. ? Have quit within the past 15 years. ? Have at least a 30-pack-year history of smoking. A pack year is smoking an average of one pack of cigarettes a day for 1 year.  Yearly screening should continue until it has been 15 years since you quit.  Yearly screening should stop if you develop a health problem that would prevent you from having lung cancer treatment. Breast Cancer  Practice breast self-awareness. This means understanding how your breasts normally appear and feel.  It also means doing regular breast self-exams. Let your health care provider know about any changes, no matter how small.  If you are in your 20s or 30s, you should have a clinical breast exam (CBE) by a health care provider every 1-3 years as part of a regular health exam.  If you are 40 or   older, have a CBE every year. Also consider having a breast X-ray (mammogram) every year.  If you have a family history of breast cancer, talk to your health care provider about genetic screening.  If you are at high risk for breast cancer, talk to your health care provider about having an MRI and a mammogram every year.  Breast cancer gene (BRCA) assessment is recommended for women who have family members with BRCA-related cancers. BRCA-related cancers  include: ? Breast. ? Ovarian. ? Tubal. ? Peritoneal cancers.  Results of the assessment will determine the need for genetic counseling and BRCA1 and BRCA2 testing. Cervical Cancer Your health care provider may recommend that you be screened regularly for cancer of the pelvic organs (ovaries, uterus, and vagina). This screening involves a pelvic examination, including checking for microscopic changes to the surface of your cervix (Pap test). You may be encouraged to have this screening done every 3 years, beginning at age 21.  For women ages 30-65, health care providers may recommend pelvic exams and Pap testing every 3 years, or they may recommend the Pap and pelvic exam, combined with testing for human papilloma virus (HPV), every 5 years. Some types of HPV increase your risk of cervical cancer. Testing for HPV may also be done on women of any age with unclear Pap test results.  Other health care providers may not recommend any screening for nonpregnant women who are considered low risk for pelvic cancer and who do not have symptoms. Ask your health care provider if a screening pelvic exam is right for you.  If you have had past treatment for cervical cancer or a condition that could lead to cancer, you need Pap tests and screening for cancer for at least 20 years after your treatment. If Pap tests have been discontinued, your risk factors (such as having a new sexual partner) need to be reassessed to determine if screening should resume. Some women have medical problems that increase the chance of getting cervical cancer. In these cases, your health care provider may recommend more frequent screening and Pap tests. Colorectal Cancer  This type of cancer can be detected and often prevented.  Routine colorectal cancer screening usually begins at 73 years of age and continues through 73 years of age.  Your health care provider may recommend screening at an earlier age if you have risk factors for  colon cancer.  Your health care provider may also recommend using home test kits to check for hidden blood in the stool.  A small camera at the end of a tube can be used to examine your colon directly (sigmoidoscopy or colonoscopy). This is done to check for the earliest forms of colorectal cancer.  Routine screening usually begins at age 50.  Direct examination of the colon should be repeated every 5-10 years through 73 years of age. However, you may need to be screened more often if early forms of precancerous polyps or small growths are found. Skin Cancer  Check your skin from head to toe regularly.  Tell your health care provider about any new moles or changes in moles, especially if there is a change in a mole's shape or color.  Also tell your health care provider if you have a mole that is larger than the size of a pencil eraser.  Always use sunscreen. Apply sunscreen liberally and repeatedly throughout the day.  Protect yourself by wearing long sleeves, pants, a wide-brimmed hat, and sunglasses whenever you are outside. Heart disease, diabetes,   and high blood pressure  High blood pressure causes heart disease and increases the risk of stroke. High blood pressure is more likely to develop in: ? People who have blood pressure in the high end of the normal range (130-139/85-89 mm Hg). ? People who are overweight or obese. ? People who are African American.  If you are 18-39 years of age, have your blood pressure checked every 3-5 years. If you are 40 years of age or older, have your blood pressure checked every year. You should have your blood pressure measured twice-once when you are at a hospital or clinic, and once when you are not at a hospital or clinic. Record the average of the two measurements. To check your blood pressure when you are not at a hospital or clinic, you can use: ? An automated blood pressure machine at a pharmacy. ? A home blood pressure monitor.  If you are  between 55 years and 79 years old, ask your health care provider if you should take aspirin to prevent strokes.  Have regular diabetes screenings. This involves taking a blood sample to check your fasting blood sugar level. ? If you are at a normal weight and have a low risk for diabetes, have this test once every three years after 73 years of age. ? If you are overweight and have a high risk for diabetes, consider being tested at a younger age or more often. Preventing infection Hepatitis B  If you have a higher risk for hepatitis B, you should be screened for this virus. You are considered at high risk for hepatitis B if: ? You were born in a country where hepatitis B is common. Ask your health care provider which countries are considered high risk. ? Your parents were born in a high-risk country, and you have not been immunized against hepatitis B (hepatitis B vaccine). ? You have HIV or AIDS. ? You use needles to inject street drugs. ? You live with someone who has hepatitis B. ? You have had sex with someone who has hepatitis B. ? You get hemodialysis treatment. ? You take certain medicines for conditions, including cancer, organ transplantation, and autoimmune conditions. Hepatitis C  Blood testing is recommended for: ? Everyone born from 1945 through 1965. ? Anyone with known risk factors for hepatitis C. Sexually transmitted infections (STIs)  You should be screened for sexually transmitted infections (STIs) including gonorrhea and chlamydia if: ? You are sexually active and are younger than 73 years of age. ? You are older than 73 years of age and your health care provider tells you that you are at risk for this type of infection. ? Your sexual activity has changed since you were last screened and you are at an increased risk for chlamydia or gonorrhea. Ask your health care provider if you are at risk.  If you do not have HIV, but are at risk, it may be recommended that you take  a prescription medicine daily to prevent HIV infection. This is called pre-exposure prophylaxis (PrEP). You are considered at risk if: ? You are sexually active and do not regularly use condoms or know the HIV status of your partner(s). ? You take drugs by injection. ? You are sexually active with a partner who has HIV. Talk with your health care provider about whether you are at high risk of being infected with HIV. If you choose to begin PrEP, you should first be tested for HIV. You should then be tested every   3 months for as long as you are taking PrEP. Pregnancy  If you are premenopausal and you may become pregnant, ask your health care provider about preconception counseling.  If you may become pregnant, take 400 to 800 micrograms (mcg) of folic acid every day.  If you want to prevent pregnancy, talk to your health care provider about birth control (contraception). Osteoporosis and menopause  Osteoporosis is a disease in which the bones lose minerals and strength with aging. This can result in serious bone fractures. Your risk for osteoporosis can be identified using a bone density scan.  If you are 65 years of age or older, or if you are at risk for osteoporosis and fractures, ask your health care provider if you should be screened.  Ask your health care provider whether you should take a calcium or vitamin D supplement to lower your risk for osteoporosis.  Menopause may have certain physical symptoms and risks.  Hormone replacement therapy may reduce some of these symptoms and risks. Talk to your health care provider about whether hormone replacement therapy is right for you. Follow these instructions at home:  Schedule regular health, dental, and eye exams.  Stay current with your immunizations.  Do not use any tobacco products including cigarettes, chewing tobacco, or electronic cigarettes.  If you are pregnant, do not drink alcohol.  If you are breastfeeding, limit how  much and how often you drink alcohol.  Limit alcohol intake to no more than 1 drink per day for nonpregnant women. One drink equals 12 ounces of beer, 5 ounces of Mallory Enriques, or 1 ounces of hard liquor.  Do not use street drugs.  Do not share needles.  Ask your health care provider for help if you need support or information about quitting drugs.  Tell your health care provider if you often feel depressed.  Tell your health care provider if you have ever been abused or do not feel safe at home. This information is not intended to replace advice given to you by your health care provider. Make sure you discuss any questions you have with your health care provider. Document Released: 10/01/2010 Document Revised: 08/24/2015 Document Reviewed: 12/20/2014 Elsevier Interactive Patient Education  2019 [employer]  

## 2018-05-12 NOTE — Assessment & Plan Note (Signed)
Flu shot up to date. Pneumonia complete. Shingrix counseled. Tetanus up to date. Colonoscopy up to date. Mammogram up to date, pap smear aged out and dexa up to date. Counseled about sun safety and mole surveillance. Counseled about the dangers of distracted driving. Given 10 year screening recommendations.

## 2018-05-12 NOTE — Assessment & Plan Note (Signed)
Checking HgA1c and adjust as needed. She is making a serious effort to work on weight loss.

## 2018-05-12 NOTE — Assessment & Plan Note (Signed)
Checking TSH and free T4 and adjust synthroid 50 mcg daily as needed.  

## 2018-05-12 NOTE — Assessment & Plan Note (Signed)
Checking lipid panel and adjust lipitor 20 mg daily as needed. 

## 2018-05-12 NOTE — Progress Notes (Signed)
Medical screening examination/treatment/procedure(s) were performed by non-physician practitioner and as supervising physician I was immediately available for consultation/collaboration. I agree with above. Naleigha Raimondi A Lonn Im, MD 

## 2018-05-12 NOTE — Progress Notes (Signed)
   Subjective:   Patient ID: Jade Sullivan, female    DOB: October 27, 1945, 73 y.o.   MRN: [id number]  HPI The patient is a 73 YO female coming in for physical. Doing water aerobics 3 times per week and working on weight loss.  PMH, [facility], social history reviewed and updated  Review of Systems  Constitutional: Negative.   HENT: Negative.   Eyes: Negative.   Respiratory: Negative for cough, chest tightness and shortness of breath.   Cardiovascular: Negative for chest pain, palpitations and leg swelling.  Gastrointestinal: Negative for abdominal distention, abdominal pain, constipation, diarrhea, nausea and vomiting.  Musculoskeletal: Negative.   Skin: Negative.   Neurological: Negative.   Psychiatric/Behavioral: Negative.     Objective:  Physical Exam Constitutional:      Appearance: She is well-developed.  HENT:     Head: Normocephalic and atraumatic.  Neck:     Musculoskeletal: Normal range of motion.  Cardiovascular:     Rate and Rhythm: Normal rate and regular rhythm.  Pulmonary:     Effort: Pulmonary effort is normal. No respiratory distress.     Breath sounds: Normal breath sounds. No wheezing or rales.  Abdominal:     General: Bowel sounds are normal. There is no distension.     Palpations: Abdomen is soft.     Tenderness: There is no abdominal tenderness. There is no rebound.  Skin:    General: Skin is warm and dry.  Neurological:     Mental Status: She is alert and oriented to person, place, and time.     Coordination: Coordination normal.     Vitals:   05/12/18 1040  BP: 122/84  Pulse: (!) 57  Temp: 98.6 F (37 C)  TempSrc: Oral  SpO2: 98%  Weight: 209 lb (94.8 kg)  Height: 5' 3.75" (1.619 m)    Assessment & Plan:

## 2018-05-12 NOTE — Patient Instructions (Signed)

## 2018-12-25 DIAGNOSIS — N182 Chronic kidney disease, stage 2 (mild): Secondary | ICD-10-CM | POA: Diagnosis not present

## 2018-12-25 DIAGNOSIS — E782 Mixed hyperlipidemia: Secondary | ICD-10-CM | POA: Diagnosis not present

## 2018-12-25 DIAGNOSIS — I129 Hypertensive chronic kidney disease with stage 1 through stage 4 chronic kidney disease, or unspecified chronic kidney disease: Secondary | ICD-10-CM | POA: Diagnosis not present

## 2019-02-12 ENCOUNTER — Other Ambulatory Visit: Payer: Self-pay | Admitting: Internal Medicine

## 2019-02-12 DIAGNOSIS — Z1231 Encounter for screening mammogram for malignant neoplasm of breast: Secondary | ICD-10-CM

## 2019-03-01 DIAGNOSIS — E0500 Thyrotoxicosis with diffuse goiter without thyrotoxic crisis or storm: Secondary | ICD-10-CM | POA: Diagnosis not present

## 2019-03-01 DIAGNOSIS — H401131 Primary open-angle glaucoma, bilateral, mild stage: Secondary | ICD-10-CM | POA: Diagnosis not present

## 2019-04-02 DIAGNOSIS — U071 COVID-19: Secondary | ICD-10-CM

## 2019-04-02 HISTORY — DX: COVID-19: U07.1

## 2019-04-05 DIAGNOSIS — Z20822 Contact with and (suspected) exposure to covid-19: Secondary | ICD-10-CM | POA: Diagnosis not present

## 2019-04-08 ENCOUNTER — Telehealth: Payer: Self-pay

## 2019-04-08 ENCOUNTER — Ambulatory Visit: Payer: Medicare Other

## 2019-04-08 NOTE — Telephone Encounter (Signed)
Called patient and advised that she does not need to do a virtual visit unless there is something that she needs to address with Dr [name]. Patient states that she does not have an issue to address, just wanted Dr [name] to know she did test positive for COVID.  Copied from [place] [phone]. Topic: General - Call Back - No Documentation >> Apr 08, 2019 10:45 AM Erick Blinks wrote: Reason for CRM: Pt received positive results from [facility] for Covid 19 last night, wants to know if PCP needs to see her for Virtual Appt. Please advise  Best contact: [phone]

## 2019-04-09 ENCOUNTER — Encounter: Payer: Self-pay | Admitting: Internal Medicine

## 2019-04-09 ENCOUNTER — Ambulatory Visit (INDEPENDENT_AMBULATORY_CARE_PROVIDER_SITE_OTHER): Payer: Medicare Other | Admitting: Internal Medicine

## 2019-04-09 DIAGNOSIS — U071 COVID-19: Secondary | ICD-10-CM

## 2019-04-09 MED ORDER — PROMETHAZINE-DM 6.25-15 MG/5ML PO SYRP: 5.0000 mL | ORAL_SOLUTION | Freq: Two times a day (BID) | ORAL | 0 refills | Status: DC | PRN

## 2019-04-09 NOTE — Addendum Note (Signed)
Addended by: Pricilla Holm A on: 04/09/2019 12:47 PM   Modules accepted: Level of Service

## 2019-04-09 NOTE — Assessment & Plan Note (Addendum)
Rx promathazine/dm cough medicine to help with sleeping. Advised to stay well hydrated. Monitor symptoms and breathing closely. Call or seek care for worsening. Talked to her about her options for care. Talked to her about need to quarantine until 14th and then only to break quarantine if feeling better.

## 2019-04-09 NOTE — Progress Notes (Signed)
Virtual Visit via Video Note  I connected with Jade Sullivan on 04/09/19 at 11:00 AM EST by a video enabled telemedicine application and verified that I am speaking with the correct person using two identifiers.  The patient and the provider were at separate locations throughout the entire encounter.   I discussed the limitations of evaluation and management by telemedicine and the availability of in person appointments. The patient expressed understanding and agreed to proceed. The patient and the provider were the only parties present for the visit unless noted in HPI below.  History of Present Illness: The patient is a 74 y.o. female with visit for covid-19. Tested 04/05/19 for covid-19. Started feeling bad around new year's. Started with cough and nose running. No appetite some headache. Running fevers still 100.78F. Has been forcing herself to eat and drink fluids. Denies dizziness. Having some muscle aches. Denies SOB. Overall it is stable to perhaps mild improvement today. Has tried nothing  Observations/Objective: Appearance: normal, breathing appears normal, non-productive cough during visit, no dyspnea or accessory muscle usage during visit, casual grooming, abdomen does not appear distended, throat not visualized due to wearing mask during visit, memory normal, mental status is A and O times 3  Assessment and Plan: See problem oriented charting  Follow Up Instructions: rx promethazine cough syrup to help with cough and sleeping, monitor status closely, call for worsening, outside window for monoclonal antibody  I discussed the assessment and treatment plan with the patient. The patient was provided an opportunity to ask questions and all were answered. The patient agreed with the plan and demonstrated an understanding of the instructions.   The patient was advised to call back or seek an in-person evaluation if the symptoms worsen or if the condition fails to improve as  anticipated.  Hoyt Koch, MD

## 2019-04-12 ENCOUNTER — Emergency Department (HOSPITAL_COMMUNITY): Payer: Medicare Other

## 2019-04-12 ENCOUNTER — Emergency Department (HOSPITAL_COMMUNITY)
Admission: EM | Admit: 2019-04-12 | Discharge: 2019-04-12 | Disposition: A | Discharge status: Home / Self Care | Payer: Medicare Other | Source: Home / Self Care | Attending: Emergency Medicine | Admitting: Emergency Medicine

## 2019-04-12 ENCOUNTER — Other Ambulatory Visit: Payer: Self-pay

## 2019-04-12 ENCOUNTER — Ambulatory Visit: Payer: Self-pay | Admitting: *Deleted

## 2019-04-12 ENCOUNTER — Encounter (HOSPITAL_COMMUNITY): Payer: Self-pay | Admitting: Emergency Medicine

## 2019-04-12 DIAGNOSIS — I5032 Chronic diastolic (congestive) heart failure: Secondary | ICD-10-CM | POA: Diagnosis not present

## 2019-04-12 DIAGNOSIS — J9601 Acute respiratory failure with hypoxia: Secondary | ICD-10-CM | POA: Diagnosis not present

## 2019-04-12 DIAGNOSIS — E1165 Type 2 diabetes mellitus with hyperglycemia: Secondary | ICD-10-CM | POA: Diagnosis not present

## 2019-04-12 DIAGNOSIS — R918 Other nonspecific abnormal finding of lung field: Secondary | ICD-10-CM | POA: Diagnosis not present

## 2019-04-12 DIAGNOSIS — R0602 Shortness of breath: Secondary | ICD-10-CM | POA: Diagnosis not present

## 2019-04-12 DIAGNOSIS — I129 Hypertensive chronic kidney disease with stage 1 through stage 4 chronic kidney disease, or unspecified chronic kidney disease: Secondary | ICD-10-CM | POA: Insufficient documentation

## 2019-04-12 DIAGNOSIS — N1831 Chronic kidney disease, stage 3a: Secondary | ICD-10-CM | POA: Diagnosis not present

## 2019-04-12 DIAGNOSIS — R05 Cough: Secondary | ICD-10-CM | POA: Diagnosis not present

## 2019-04-12 DIAGNOSIS — E034 Atrophy of thyroid (acquired): Secondary | ICD-10-CM | POA: Diagnosis not present

## 2019-04-12 DIAGNOSIS — E119 Type 2 diabetes mellitus without complications: Secondary | ICD-10-CM | POA: Diagnosis not present

## 2019-04-12 DIAGNOSIS — Z9119 Patient's noncompliance with other medical treatment and regimen: Secondary | ICD-10-CM | POA: Diagnosis not present

## 2019-04-12 DIAGNOSIS — U071 COVID-19: Secondary | ICD-10-CM | POA: Diagnosis not present

## 2019-04-12 DIAGNOSIS — Z6834 Body mass index (BMI) 34.0-34.9, adult: Secondary | ICD-10-CM | POA: Diagnosis not present

## 2019-04-12 DIAGNOSIS — E860 Dehydration: Secondary | ICD-10-CM | POA: Diagnosis not present

## 2019-04-12 DIAGNOSIS — E861 Hypovolemia: Secondary | ICD-10-CM | POA: Diagnosis not present

## 2019-04-12 DIAGNOSIS — Z79890 Hormone replacement therapy (postmenopausal): Secondary | ICD-10-CM | POA: Diagnosis not present

## 2019-04-12 DIAGNOSIS — Z743 Need for continuous supervision: Secondary | ICD-10-CM | POA: Diagnosis not present

## 2019-04-12 DIAGNOSIS — E039 Hypothyroidism, unspecified: Secondary | ICD-10-CM | POA: Insufficient documentation

## 2019-04-12 DIAGNOSIS — I340 Nonrheumatic mitral (valve) insufficiency: Secondary | ICD-10-CM | POA: Diagnosis not present

## 2019-04-12 DIAGNOSIS — N183 Chronic kidney disease, stage 3 unspecified: Secondary | ICD-10-CM | POA: Diagnosis not present

## 2019-04-12 DIAGNOSIS — N179 Acute kidney failure, unspecified: Secondary | ICD-10-CM | POA: Diagnosis not present

## 2019-04-12 DIAGNOSIS — E782 Mixed hyperlipidemia: Secondary | ICD-10-CM | POA: Diagnosis not present

## 2019-04-12 DIAGNOSIS — Z79899 Other long term (current) drug therapy: Secondary | ICD-10-CM | POA: Insufficient documentation

## 2019-04-12 DIAGNOSIS — E6609 Other obesity due to excess calories: Secondary | ICD-10-CM | POA: Diagnosis not present

## 2019-04-12 DIAGNOSIS — Z87891 Personal history of nicotine dependence: Secondary | ICD-10-CM | POA: Insufficient documentation

## 2019-04-12 DIAGNOSIS — J1282 Pneumonia due to coronavirus disease 2019: Secondary | ICD-10-CM | POA: Diagnosis present

## 2019-04-12 DIAGNOSIS — R0902 Hypoxemia: Secondary | ICD-10-CM | POA: Diagnosis not present

## 2019-04-12 DIAGNOSIS — Z841 Family history of disorders of kidney and ureter: Secondary | ICD-10-CM | POA: Diagnosis not present

## 2019-04-12 DIAGNOSIS — Z833 Family history of diabetes mellitus: Secondary | ICD-10-CM | POA: Diagnosis not present

## 2019-04-12 DIAGNOSIS — E1122 Type 2 diabetes mellitus with diabetic chronic kidney disease: Secondary | ICD-10-CM | POA: Diagnosis not present

## 2019-04-12 DIAGNOSIS — I130 Hypertensive heart and chronic kidney disease with heart failure and stage 1 through stage 4 chronic kidney disease, or unspecified chronic kidney disease: Secondary | ICD-10-CM | POA: Diagnosis not present

## 2019-04-12 DIAGNOSIS — E669 Obesity, unspecified: Secondary | ICD-10-CM | POA: Diagnosis present

## 2019-04-12 DIAGNOSIS — Z888 Allergy status to other drugs, medicaments and biological substances status: Secondary | ICD-10-CM | POA: Diagnosis not present

## 2019-04-12 DIAGNOSIS — Z8249 Family history of ischemic heart disease and other diseases of the circulatory system: Secondary | ICD-10-CM | POA: Diagnosis not present

## 2019-04-12 DIAGNOSIS — E8881 Metabolic syndrome: Secondary | ICD-10-CM | POA: Diagnosis present

## 2019-04-12 DIAGNOSIS — I351 Nonrheumatic aortic (valve) insufficiency: Secondary | ICD-10-CM | POA: Diagnosis not present

## 2019-04-12 DIAGNOSIS — E870 Hyperosmolality and hypernatremia: Secondary | ICD-10-CM | POA: Diagnosis not present

## 2019-04-12 LAB — CBC WITH DIFFERENTIAL/PLATELET
Abs Immature Granulocytes: 0.03 10*3/uL (ref 0.00–0.07)
Basophils Absolute: 0 10*3/uL (ref 0.0–0.1)
Basophils Relative: 0 %
Eosinophils Absolute: 0 10*3/uL (ref 0.0–0.5)
Eosinophils Relative: 0 %
HCT: 39.9 % (ref 36.0–46.0)
Hemoglobin: 13.2 g/dL (ref 12.0–15.0)
Immature Granulocytes: 1 %
Lymphocytes Relative: 21 %
Lymphs Abs: 1.1 10*3/uL (ref 0.7–4.0)
MCH: 30.2 pg (ref 26.0–34.0)
MCHC: 33.1 g/dL (ref 30.0–36.0)
MCV: 91.3 fL (ref 80.0–100.0)
Monocytes Absolute: 0.4 10*3/uL (ref 0.1–1.0)
Monocytes Relative: 8 %
Neutro Abs: 3.6 10*3/uL (ref 1.7–7.7)
Neutrophils Relative %: 70 %
Platelets: 166 10*3/uL (ref 150–400)
RBC: 4.37 MIL/uL (ref 3.87–5.11)
RDW: 13.2 % (ref 11.5–15.5)
WBC: 5.1 10*3/uL (ref 4.0–10.5)
nRBC: 0 % (ref 0.0–0.2)

## 2019-04-12 LAB — BASIC METABOLIC PANEL
Anion gap: 12 (ref 5–15)
BUN: 26 mg/dL — ABNORMAL HIGH (ref 8–23)
CO2: 20 mmol/L — ABNORMAL LOW (ref 22–32)
Calcium: 8.8 mg/dL — ABNORMAL LOW (ref 8.9–10.3)
Chloride: 108 mmol/L (ref 98–111)
Creatinine, Ser: 1.6 mg/dL — ABNORMAL HIGH (ref 0.44–1.00)
GFR calc Af Amer: 37 mL/min — ABNORMAL LOW (ref >60)
GFR calc non Af Amer: 32 mL/min — ABNORMAL LOW (ref >60)
Glucose, Bld: 322 mg/dL — ABNORMAL HIGH (ref 70–99)
Potassium: 3.8 mmol/L (ref 3.5–5.1)
Sodium: 140 mmol/L (ref 135–145)

## 2019-04-12 LAB — TROPONIN I (HIGH SENSITIVITY): Troponin I (High Sensitivity): 4 ng/L (ref <18)

## 2019-04-12 MED ORDER — BENZONATATE 100 MG PO CAPS: 100.0000 mg | ORAL_CAPSULE | Freq: Three times a day (TID) | ORAL | 0 refills | Status: DC | PRN

## 2019-04-12 NOTE — Discharge Instructions (Signed)
Please call your primary doctor to schedule a virtual recheck in 24 to 48 hours.  If you have any worsening in your shortness of breath, please return immediately to the emergency department.  Additionally recommend monitoring your oxygen levels at home as discussed, if your oxygen levels are dropping below 90%, this would be another reason to come back to the ER.

## 2019-04-12 NOTE — Telephone Encounter (Signed)
Noted  

## 2019-04-12 NOTE — Telephone Encounter (Signed)
Patient is positive Covid with a change in her breathing reports her son. Breathing more rapidly than normal. Denies chest tightness/SOB/wheezing. Has fever this morning. Began coughing up pink tinged sputum along with a bloody stool yesterday and again today. No diarrhea/no abdominal pain. Advised ED at this time. Called to inform [facility] ED of patient's arrival. Routing to provider.   Reason for Disposition . [1] Coughed up blood AND [2] > 1 tablespoon (15 ml) (Exception: blood-tinged sputum)  Answer Assessment - Initial Assessment Questions 1. ONSET: "When did you start coughing up blood?"     yesterday 2. SEVERITY: "How many times?" "How much blood?" (e.g., flecks, streaks, tablespoons, etc)     2-3 times, spoonful each 3. COUGHING SPASMS: "Did the blood appear after a coughing spell?"       yes 4. RESPIRATORY DISTRESS: "Describe your breathing."      Breathing faster than normal 5. FEVER: "Do you have a fever?" If so, ask: "What is your temperature, how was it measured, and when did it start?"    Yes 6. SPUTUM: "Describe the color of your sputum" (clear, white, yellow, green), "Has there been any change recently?"     White with a pink tinge 7. CARDIAC HISTORY: "Do you have any history of heart disease?" (e.g., heart attack, congestive heart failure)      yes 8. LUNG HISTORY: "Do you have any history of lung disease?"  (e.g., pulmonary embolus, asthma, emphysema)     no 9. PE RISK FACTORS: "Do you have a history of blood clots?" (or: recent major surgery, recent prolonged travel, bedridden)     o 10. OTHER SYMPTOMS: "Do you have any other symptoms?" (e.g., nosebleed, chest pain, abdominal pain, vomiting)       Blood in the stool yesterday and this morning. 11. PREGNANCY: "Is there any chance you are pregnant?" "When was your last menstrual period?"       na 12. TRAVEL: "Have you traveled out of the country in the last month?" (e.g., travel history, exposures)       no  Protocols used:  COUGHING UP BLOOD-A-AH

## 2019-04-12 NOTE — ED Triage Notes (Signed)
The patient presents with shortness of breath that started over the weekend. She also complains of a cough. Patient patient was tested last Monday for Covid. She is positive for Covid 19.

## 2019-04-12 NOTE — ED Notes (Signed)
Patient ambulated around the room. Toward the end of ambulation, the patient's O2 sats dropped to 88.

## 2019-04-12 NOTE — ED Provider Notes (Signed)
[facility] DEPT Provider Note   CSN: [id number] Arrival date & time: 04/12/19  1101     History Chief Complaint  Patient presents with  . Shortness of Breath  . Cough    Jade Sullivan is a 74 y.o. female.  Presents to ER with cough, shortness of breath.  Known history COVID-19.  States she started having symptoms on New Year's Day, initially noted cough some generalized fatigue as well as chills.  Then developed fevers, worsening cough and worsening shortness of breath.  No associated chest pain, no abdominal pain.  Has oxygen portable monitor at home and states been 95 or greater.  Past medical history hypertension, hyperlipidemia, hypothyroid, CKD.  HPI     Past Medical History:  Diagnosis Date  . Allergy    seasonal  . Arthritis    knees  . Chronic kidney disease   . Diverticulosis of colon (without mention of hemorrhage)   . Dysmetabolic syndrome X   . Gout   . H/O hyperthyroidism    By patient h/o hyperthyroidism with protosis, s/p subtotal thyroidectomy. On no replacement therapy. Last thyroid functions 2013 in [place]  Plan Will need thyroid functions Feb '15 with recommendations to follow.    . Heart murmur    yeras ago told had murmur  . Hyperlipidemia   . Hypertension    on multiple meds  . Hypothyroidism     Patient Active Problem List   Diagnosis Date Noted  . COVID-19 04/09/2019  . Routine general medical examination at a health care facility 03/17/2017  . History of acute gouty arthritis 11/03/2014  . Hypothyroidism 09/28/2014  . Lipoma of shoulder 09/27/2014  . H/O hyperthyroidism 02/17/2013  . Mixed hyperlipidemia 11/16/2012  . Impaired fasting blood sugar 11/16/2012  . 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 Obesity, unspecified 10/27/2012  . CKD (chronic kidney disease) stage 3, GFR 30-59 ml/min 10/27/2012  . Hypertension, renal disease     Past Surgical History:  Procedure Laterality Date    . [place]  . COLONOSCOPY    . EYE SURGERY    . HERNIA REPAIR  2000  . LIPOMA EXCISION Right 11/24/2014   Procedure: EXCISION RIGHT SHOULDER LIPOMA;  Surgeon: Erroll Luna, MD;  Location: [facility];  Service: General;  Laterality: Right;  . POLYPECTOMY    . THYROIDECTOMY, PARTIAL  1981     OB History    Gravida  1   Para  1   Term      Preterm      AB      Living  1     SAB      TAB      Ectopic      Multiple      Live Births              Family History  Problem Relation Age of Onset  . Diabetes Mother        DM2  . Hypertension Mother   . Pneumonia Mother        died from this condition  . Colon polyps Mother   . Heart disease Father        died from this condition  . Colon polyps Father   . Kidney disease Sister        HBP and DM2; relative died from this condition  . Heart disease Brother        MI - HBP and  DM2  . Colon cancer Neg Hx   . Esophageal cancer Neg Hx   . Rectal cancer Neg Hx   . Stomach cancer Neg Hx     Social History   Tobacco Use  . Smoking status: Former Research scientist (life sciences)  . Smokeless tobacco: Never Used  Substance Use Topics  . Alcohol use: No    Alcohol/week: 0.0 standard drinks  . Drug use: No    Home Medications Prior to Admission medications   Medication Sig Start Date End Date Taking? Authorizing Provider  Aspirin-Acetaminophen-Caffeine (EXCEDRIN PO) Take 1 tablet by mouth as needed.    [provider]  atorvastatin (LIPITOR) 20 MG tablet Take 1 tablet (20 mg total) by mouth daily. 05/12/18   Hoyt Koch, MD  benzonatate (TESSALON) 100 MG capsule Take 1 capsule (100 mg total) by mouth 3 (three) times daily as needed for cough. 04/12/19   Lucrezia Starch, MD  cholecalciferol (VITAMIN D3) 25 MCG (1000 UT) tablet Take 1,000 Units by mouth daily.    [provider]  cloNIDine (CATAPRES - DOSED IN MG/24 HR) 0.3 mg/24hr patch  11/27/15   [provider]   cloNIDine (CATAPRES) 0.2 MG tablet Take 0.2 mg by mouth 2 (two) times daily.    [provider]  fish oil-omega-3 fatty acids 1000 MG capsule Take 2 g by mouth daily.    [provider]  gabapentin (NEURONTIN) 100 MG capsule Take 2 capsules (200 mg total) by mouth at bedtime. 10/29/16   Lyndal Pulley, DO  latanoprost (XALATAN) 0.005 % ophthalmic solution 1 drop at bedtime.    [provider]  levothyroxine (SYNTHROID, LEVOTHROID) 50 MCG tablet Take 1 tablet (50 mcg total) by mouth daily before breakfast. 05/12/18   Hoyt Koch, MD  losartan (COZAAR) 100 MG tablet Take 1 tablet (100 mg total) by mouth daily. 05/12/18   Hoyt Koch, MD  minoxidil (LONITEN) 2.5 MG tablet Take 2.5 mg by mouth 2 (two) times daily.    [provider]  Misc Natural Products (TART CHERRY ADVANCED PO) Take by mouth.    [provider]  Multiple Vitamin (MULTIVITAMIN) tablet Take 1 tablet by mouth daily.    [provider]  nebivolol (BYSTOLIC) 5 MG tablet Take 5 mg by mouth daily.    [provider]  promethazine-dextromethorphan (PROMETHAZINE-DM) 6.25-15 MG/5ML syrup Take 5 mLs by mouth 2 (two) times daily as needed for cough. 04/09/19   Hoyt Koch, MD  spironolactone (ALDACTONE) 100 MG tablet Take 100 mg by mouth daily.    [provider]  timolol (BETIMOL) 0.25 % ophthalmic solution 1-2 drops 2 (two) times daily.    [provider]    Allergies    Amlodipine and Hctz [hydrochlorothiazide]  Review of Systems   Review of Systems  Constitutional: Positive for chills, fatigue and fever.  HENT: Negative for ear pain and sore throat.   Eyes: Negative for pain and visual disturbance.  Respiratory: Positive for cough and shortness of breath.   Cardiovascular: Negative for chest pain and palpitations.  Gastrointestinal: Negative for abdominal pain and vomiting.  Genitourinary: Negative for dysuria and hematuria.   Musculoskeletal: Negative for arthralgias and back pain.  Skin: Negative for color change and rash.  Neurological: Negative for seizures and syncope.  All other systems reviewed and are negative.   Physical Exam Updated Vital Signs BP 105/69   Pulse 72   Temp 98.6 F (37 C) (Oral)   Resp (!) 27  Ht 5\' 5"  (1.651 m)   Wt 95.3 kg   SpO2 95%   BMI 34.95 kg/m   Physical Exam Vitals and nursing note reviewed.  Constitutional:      General: She is not in acute distress.    Appearance: She is well-developed.  HENT:     Head: Normocephalic and atraumatic.  Eyes:     Conjunctiva/sclera: Conjunctivae normal.  Cardiovascular:     Rate and Rhythm: Normal rate and regular rhythm.     Heart sounds: No murmur.  Pulmonary:     Effort: Pulmonary effort is normal. No respiratory distress.     Breath sounds: Normal breath sounds.  Chest:     Chest wall: No mass.  Abdominal:     Palpations: Abdomen is soft.     Tenderness: There is no abdominal tenderness.  Musculoskeletal:     Cervical back: Neck supple.  Skin:    General: Skin is warm and dry.     Capillary Refill: Capillary refill takes less than 2 seconds.  Neurological:     General: No focal deficit present.     Mental Status: She is alert and oriented to person, place, and time.  Psychiatric:        Mood and Affect: Mood normal.        Behavior: Behavior normal.     ED Results / Procedures / Treatments   Labs (all labs ordered are listed, but only abnormal results are displayed) Labs Reviewed  BASIC METABOLIC PANEL - Abnormal; Notable for the following components:      Result Value   CO2 20 (*)    Glucose, Bld 322 (*)    BUN 26 (*)    Creatinine, Ser 1.60 (*)    Calcium 8.8 (*)    GFR calc non Af Amer 32 (*)    GFR calc Af Amer 37 (*)    All other components within normal limits  CBC WITH DIFFERENTIAL/PLATELET  TROPONIN I (HIGH SENSITIVITY)    EKG EKG Interpretation  Date/Time:  Monday April 12 2019  11:45:04 EST Ventricular Rate:  82 PR Interval:    QRS Duration: 88 QT Interval:  388 QTC Calculation: 454 R Axis:   -30 Text Interpretation: Sinus rhythm Prolonged PR interval Left axis deviation Abnormal R-wave progression, late transition Confirmed by Madalyn Rob [phone]) on 04/12/2019 12:37:47 PM   Radiology DG Chest Portable 1 View  Result Date: 04/12/2019 CLINICAL DATA:  Cough. COVID-19. Shortness of breath. EXAM: PORTABLE CHEST 1 VIEW COMPARISON:  None. FINDINGS: There are 3 patchy small hazy infiltrates in the right lung. There is increased density at the left lung base which may represent infiltrate. Heart size and vascularity are normal. Aortic atherosclerosis. No acute bone abnormality. IMPRESSION: 1. Three small patchy infiltrates in the right lung. 2. Increased density at the left lung base which may represent infiltrate. 3.  Aortic Atherosclerosis (ICD10-I70.0). Electronically Signed   By: Lorriane Shire M.D.   On: 04/12/2019 13:04    Procedures Procedures (including critical care time)  Medications Ordered in ED Medications - No data to display  ED Course  I have reviewed the triage vital signs and the nursing notes.  Pertinent labs & imaging results that were available during my care of the patient were reviewed by me and considered in my medical decision making (see chart for details).    MDM Rules/Calculators/A&P  74 year old lady hypertension, hyperlipidemia, hypothyroid presents to ER with worsening cough, shortness of breath in setting of known COVID-19.  CXR with some mild infiltrates, consistent with COVID-19 viral pneumonia.  Labs within normal limits.  On exam, patient was well-appearing, no respiratory distress, no accessory muscle use.  She remained 95% or greater throughout the vast majority of her visit.  On ambulation trial she did quite well however had brief episode per RN of O2 sat 88% but quickly recovered.  Discussed risk  benefits of admission versus discharge.  Patient states she strongly desires to go home at this time if possible.  Given her work-up today and current state, believe this is a reasonable option.  However, reviewed very strict return precautions should she develop any worsening of her symptoms, any oxygen desaturations at home.  Recommend virtual recheck with primary doctor.  Patient agreeable, will discharge.    After the discussed management above, the patient was determined to be safe for discharge.  The patient was in agreement with this plan and all questions regarding their care were answered.  ED return precautions were discussed and the patient will return to the ED with any significant worsening of condition.   MARATHA MURDEN was evaluated in Emergency Department on 04/12/2019 for the symptoms described in the history of present illness. She was evaluated in the context of the global COVID-19 pandemic, which necessitated consideration that the patient might be at risk for infection with the SARS-CoV-2 virus that causes COVID-19. Institutional protocols and algorithms that pertain to the evaluation of patients at risk for COVID-19 are in a state of rapid change based on information released by regulatory bodies including the CDC and federal and state organizations. These policies and algorithms were followed during the patient's care in the ED.  Final Clinical Impression(s) / ED Diagnoses Final diagnoses:  [id number]    Rx / DC Orders ED Discharge Orders         Ordered    benzonatate (TESSALON) 100 MG capsule  3 times daily PRN     04/12/19 1330           Lucrezia Starch, MD 04/12/19 1338

## 2019-04-13 ENCOUNTER — Emergency Department (HOSPITAL_COMMUNITY): Payer: Medicare Other

## 2019-04-13 ENCOUNTER — Encounter (HOSPITAL_COMMUNITY): Payer: Self-pay

## 2019-04-13 ENCOUNTER — Other Ambulatory Visit: Payer: Self-pay

## 2019-04-13 ENCOUNTER — Inpatient Hospital Stay (HOSPITAL_COMMUNITY)
Admission: EM | Admit: 2019-04-13 | Discharge: 2019-04-22 | DRG: 177 | Disposition: A | Discharge status: Home Health | Payer: Medicare Other | Attending: Internal Medicine | Admitting: Internal Medicine

## 2019-04-13 DIAGNOSIS — Z8249 Family history of ischemic heart disease and other diseases of the circulatory system: Secondary | ICD-10-CM

## 2019-04-13 DIAGNOSIS — I130 Hypertensive heart and chronic kidney disease with heart failure and stage 1 through stage 4 chronic kidney disease, or unspecified chronic kidney disease: Secondary | ICD-10-CM | POA: Diagnosis present

## 2019-04-13 DIAGNOSIS — Z87891 Personal history of nicotine dependence: Secondary | ICD-10-CM

## 2019-04-13 DIAGNOSIS — E119 Type 2 diabetes mellitus without complications: Secondary | ICD-10-CM

## 2019-04-13 DIAGNOSIS — R0602 Shortness of breath: Secondary | ICD-10-CM | POA: Diagnosis not present

## 2019-04-13 DIAGNOSIS — E1165 Type 2 diabetes mellitus with hyperglycemia: Secondary | ICD-10-CM | POA: Diagnosis not present

## 2019-04-13 DIAGNOSIS — Z713 Dietary counseling and surveillance: Secondary | ICD-10-CM

## 2019-04-13 DIAGNOSIS — Z888 Allergy status to other drugs, medicaments and biological substances status: Secondary | ICD-10-CM | POA: Diagnosis not present

## 2019-04-13 DIAGNOSIS — E039 Hypothyroidism, unspecified: Secondary | ICD-10-CM | POA: Diagnosis not present

## 2019-04-13 DIAGNOSIS — E6609 Other obesity due to excess calories: Secondary | ICD-10-CM | POA: Diagnosis not present

## 2019-04-13 DIAGNOSIS — E034 Atrophy of thyroid (acquired): Secondary | ICD-10-CM | POA: Diagnosis not present

## 2019-04-13 DIAGNOSIS — N1831 Chronic kidney disease, stage 3a: Secondary | ICD-10-CM | POA: Diagnosis present

## 2019-04-13 DIAGNOSIS — I5032 Chronic diastolic (congestive) heart failure: Secondary | ICD-10-CM | POA: Diagnosis present

## 2019-04-13 DIAGNOSIS — I340 Nonrheumatic mitral (valve) insufficiency: Secondary | ICD-10-CM | POA: Diagnosis not present

## 2019-04-13 DIAGNOSIS — E782 Mixed hyperlipidemia: Secondary | ICD-10-CM

## 2019-04-13 DIAGNOSIS — Z9119 Patient's noncompliance with other medical treatment and regimen: Secondary | ICD-10-CM

## 2019-04-13 DIAGNOSIS — J9601 Acute respiratory failure with hypoxia: Secondary | ICD-10-CM | POA: Diagnosis present

## 2019-04-13 DIAGNOSIS — I351 Nonrheumatic aortic (valve) insufficiency: Secondary | ICD-10-CM | POA: Diagnosis not present

## 2019-04-13 DIAGNOSIS — Z6834 Body mass index (BMI) 34.0-34.9, adult: Secondary | ICD-10-CM | POA: Diagnosis not present

## 2019-04-13 DIAGNOSIS — J1282 Pneumonia due to coronavirus disease 2019: Secondary | ICD-10-CM

## 2019-04-13 DIAGNOSIS — E870 Hyperosmolality and hypernatremia: Secondary | ICD-10-CM | POA: Diagnosis not present

## 2019-04-13 DIAGNOSIS — E1122 Type 2 diabetes mellitus with diabetic chronic kidney disease: Secondary | ICD-10-CM | POA: Diagnosis present

## 2019-04-13 DIAGNOSIS — E860 Dehydration: Secondary | ICD-10-CM | POA: Diagnosis present

## 2019-04-13 DIAGNOSIS — E8881 Metabolic syndrome: Secondary | ICD-10-CM | POA: Diagnosis present

## 2019-04-13 DIAGNOSIS — R918 Other nonspecific abnormal finding of lung field: Secondary | ICD-10-CM | POA: Diagnosis not present

## 2019-04-13 DIAGNOSIS — N179 Acute kidney failure, unspecified: Secondary | ICD-10-CM | POA: Diagnosis not present

## 2019-04-13 DIAGNOSIS — U071 COVID-19: Secondary | ICD-10-CM | POA: Diagnosis present

## 2019-04-13 DIAGNOSIS — Z79890 Hormone replacement therapy (postmenopausal): Secondary | ICD-10-CM | POA: Diagnosis not present

## 2019-04-13 DIAGNOSIS — N183 Chronic kidney disease, stage 3 unspecified: Secondary | ICD-10-CM

## 2019-04-13 DIAGNOSIS — Z833 Family history of diabetes mellitus: Secondary | ICD-10-CM | POA: Diagnosis not present

## 2019-04-13 DIAGNOSIS — E861 Hypovolemia: Secondary | ICD-10-CM | POA: Diagnosis not present

## 2019-04-13 DIAGNOSIS — R0600 Dyspnea, unspecified: Secondary | ICD-10-CM

## 2019-04-13 DIAGNOSIS — Z841 Family history of disorders of kidney and ureter: Secondary | ICD-10-CM | POA: Diagnosis not present

## 2019-04-13 DIAGNOSIS — Z743 Need for continuous supervision: Secondary | ICD-10-CM | POA: Diagnosis not present

## 2019-04-13 DIAGNOSIS — Z79899 Other long term (current) drug therapy: Secondary | ICD-10-CM | POA: Diagnosis not present

## 2019-04-13 DIAGNOSIS — E669 Obesity, unspecified: Secondary | ICD-10-CM | POA: Diagnosis present

## 2019-04-13 DIAGNOSIS — R079 Chest pain, unspecified: Secondary | ICD-10-CM

## 2019-04-13 DIAGNOSIS — R0902 Hypoxemia: Secondary | ICD-10-CM | POA: Diagnosis not present

## 2019-04-13 DIAGNOSIS — E785 Hyperlipidemia, unspecified: Secondary | ICD-10-CM | POA: Diagnosis present

## 2019-04-13 DIAGNOSIS — E1169 Type 2 diabetes mellitus with other specified complication: Secondary | ICD-10-CM | POA: Diagnosis present

## 2019-04-13 LAB — CBC WITH DIFFERENTIAL/PLATELET
Abs Immature Granulocytes: 0.03 10*3/uL (ref 0.00–0.07)
Basophils Absolute: 0 10*3/uL (ref 0.0–0.1)
Basophils Relative: 0 %
Eosinophils Absolute: 0 10*3/uL (ref 0.0–0.5)
Eosinophils Relative: 0 %
HCT: 38.7 % (ref 36.0–46.0)
Hemoglobin: 12.9 g/dL (ref 12.0–15.0)
Immature Granulocytes: 0 %
Lymphocytes Relative: 15 %
Lymphs Abs: 1 10*3/uL (ref 0.7–4.0)
MCH: 30.6 pg (ref 26.0–34.0)
MCHC: 33.3 g/dL (ref 30.0–36.0)
MCV: 91.9 fL (ref 80.0–100.0)
Monocytes Absolute: 0.4 10*3/uL (ref 0.1–1.0)
Monocytes Relative: 6 %
Neutro Abs: 5.4 10*3/uL (ref 1.7–7.7)
Neutrophils Relative %: 79 %
Platelets: 184 10*3/uL (ref 150–400)
RBC: 4.21 MIL/uL (ref 3.87–5.11)
RDW: 13.6 % (ref 11.5–15.5)
WBC: 6.8 10*3/uL (ref 4.0–10.5)
nRBC: 0 % (ref 0.0–0.2)

## 2019-04-13 LAB — COMPREHENSIVE METABOLIC PANEL
ALT: 21 U/L (ref 0–44)
AST: 48 U/L — ABNORMAL HIGH (ref 15–41)
Albumin: 3.4 g/dL — ABNORMAL LOW (ref 3.5–5.0)
Alkaline Phosphatase: 72 U/L (ref 38–126)
Anion gap: 16 — ABNORMAL HIGH (ref 5–15)
BUN: 32 mg/dL — ABNORMAL HIGH (ref 8–23)
CO2: 18 mmol/L — ABNORMAL LOW (ref 22–32)
Calcium: 8.8 mg/dL — ABNORMAL LOW (ref 8.9–10.3)
Chloride: 107 mmol/L (ref 98–111)
Creatinine, Ser: 1.94 mg/dL — ABNORMAL HIGH (ref 0.44–1.00)
GFR calc Af Amer: 29 mL/min — ABNORMAL LOW (ref >60)
GFR calc non Af Amer: 25 mL/min — ABNORMAL LOW (ref >60)
Glucose, Bld: 364 mg/dL — ABNORMAL HIGH (ref 70–99)
Potassium: 4.1 mmol/L (ref 3.5–5.1)
Sodium: 141 mmol/L (ref 135–145)
Total Bilirubin: 0.8 mg/dL (ref 0.3–1.2)
Total Protein: 7.5 g/dL (ref 6.5–8.1)

## 2019-04-13 LAB — C-REACTIVE PROTEIN: CRP: 25.1 mg/dL — ABNORMAL HIGH (ref <1.0)

## 2019-04-13 LAB — FIBRINOGEN: Fibrinogen: 589 mg/dL — ABNORMAL HIGH (ref 210–475)

## 2019-04-13 LAB — PROCALCITONIN: Procalcitonin: 0.24 ng/mL

## 2019-04-13 LAB — D-DIMER, QUANTITATIVE: D-Dimer, Quant: 1.86 ug/mL-FEU — ABNORMAL HIGH (ref 0.00–0.50)

## 2019-04-13 LAB — TRIGLYCERIDES: Triglycerides: 143 mg/dL (ref <150)

## 2019-04-13 LAB — LACTATE DEHYDROGENASE: LDH: 477 U/L — ABNORMAL HIGH (ref 98–192)

## 2019-04-13 LAB — LACTIC ACID, PLASMA: Lactic Acid, Venous: 2.8 mmol/L (ref 0.5–1.9)

## 2019-04-13 LAB — FERRITIN: Ferritin: 488 ng/mL — ABNORMAL HIGH (ref 11–307)

## 2019-04-13 MED ORDER — SODIUM CHLORIDE 0.9 % IV SOLN
100.0000 mg | Freq: Every day | INTRAVENOUS | Status: AC
  Administered 2019-04-14 – 2019-04-17 (×4): 100 mg via INTRAVENOUS
  Filled 2019-04-13 (×4): qty 100

## 2019-04-13 MED ORDER — ACETAMINOPHEN 325 MG PO TABS: 650.0000 mg | ORAL_TABLET | Freq: Four times a day (QID) | ORAL | Status: DC | PRN

## 2019-04-13 MED ORDER — ENOXAPARIN SODIUM 30 MG/0.3ML ~~LOC~~ SOLN: 30.0000 mg | SUBCUTANEOUS | Status: DC

## 2019-04-13 MED ORDER — ONDANSETRON HCL 4 MG/2ML IJ SOLN: 4.0000 mg | Freq: Four times a day (QID) | INTRAMUSCULAR | Status: DC | PRN

## 2019-04-13 MED ORDER — LATANOPROST 0.005 % OP SOLN
1.0000 [drp] | Freq: Every day | OPHTHALMIC | Status: DC
  Administered 2019-04-14 – 2019-04-21 (×9): 1 [drp] via OPHTHALMIC
  Filled 2019-04-13: qty 2.5

## 2019-04-13 MED ORDER — SODIUM CHLORIDE 0.9 % IV BOLUS
500.0000 mL | Freq: Once | INTRAVENOUS | Status: AC
  Administered 2019-04-13: 500 mL via INTRAVENOUS

## 2019-04-13 MED ORDER — LEVOTHYROXINE SODIUM 50 MCG PO TABS
50.0000 ug | ORAL_TABLET | Freq: Every day | ORAL | Status: DC
  Administered 2019-04-14 – 2019-04-22 (×9): 50 ug via ORAL
  Filled 2019-04-13 (×9): qty 1

## 2019-04-13 MED ORDER — DEXAMETHASONE SODIUM PHOSPHATE 10 MG/ML IJ SOLN
6.0000 mg | INTRAMUSCULAR | Status: DC
  Administered 2019-04-14: 6 mg via INTRAVENOUS
  Filled 2019-04-13: qty 1

## 2019-04-13 MED ORDER — ACETAMINOPHEN 500 MG PO TABS
1000.0000 mg | ORAL_TABLET | Freq: Once | ORAL | Status: AC
  Administered 2019-04-13: 1000 mg via ORAL
  Filled 2019-04-13: qty 2

## 2019-04-13 MED ORDER — GUAIFENESIN-DM 100-10 MG/5ML PO SYRP
10.0000 mL | ORAL_SOLUTION | ORAL | Status: DC | PRN
  Administered 2019-04-18: 10 mL via ORAL
  Filled 2019-04-13: qty 10

## 2019-04-13 MED ORDER — SODIUM CHLORIDE 0.9% FLUSH
3.0000 mL | Freq: Two times a day (BID) | INTRAVENOUS | Status: DC
  Administered 2019-04-14 – 2019-04-22 (×17): 3 mL via INTRAVENOUS

## 2019-04-13 MED ORDER — TIMOLOL HEMIHYDRATE 0.25 % OP SOLN: 1.0000 [drp] | Freq: Two times a day (BID) | OPHTHALMIC | Status: DC

## 2019-04-13 MED ORDER — INSULIN ASPART 100 UNIT/ML ~~LOC~~ SOLN
0.0000 [IU] | SUBCUTANEOUS | Status: DC
  Administered 2019-04-14: 3 [IU] via SUBCUTANEOUS
  Administered 2019-04-14: 7 [IU] via SUBCUTANEOUS
  Administered 2019-04-14: 3 [IU] via SUBCUTANEOUS
  Filled 2019-04-13: qty 0.09

## 2019-04-13 MED ORDER — SODIUM CHLORIDE 0.9 % IV SOLN: INTRAVENOUS | Status: DC

## 2019-04-13 MED ORDER — SODIUM CHLORIDE 0.9 % IV SOLN
200.0000 mg | Freq: Once | INTRAVENOUS | Status: AC
  Administered 2019-04-13: 200 mg via INTRAVENOUS
  Filled 2019-04-13: qty 200

## 2019-04-13 MED ORDER — HYDROCODONE-ACETAMINOPHEN 5-325 MG PO TABS: 1.0000 | ORAL_TABLET | ORAL | Status: DC | PRN

## 2019-04-13 MED ORDER — ATORVASTATIN CALCIUM 10 MG PO TABS
20.0000 mg | ORAL_TABLET | Freq: Every day | ORAL | Status: DC
  Administered 2019-04-14 – 2019-04-22 (×9): 20 mg via ORAL
  Filled 2019-04-13: qty 1
  Filled 2019-04-13: qty 2
  Filled 2019-04-13 (×5): qty 1
  Filled 2019-04-13: qty 2
  Filled 2019-04-13: qty 1

## 2019-04-13 MED ORDER — TIMOLOL MALEATE 0.25 % OP SOLN
1.0000 [drp] | Freq: Two times a day (BID) | OPHTHALMIC | Status: DC
  Administered 2019-04-14 – 2019-04-22 (×18): 1 [drp] via OPHTHALMIC
  Filled 2019-04-13 (×2): qty 5

## 2019-04-13 MED ORDER — ONDANSETRON HCL 4 MG PO TABS: 4.0000 mg | ORAL_TABLET | Freq: Four times a day (QID) | ORAL | Status: DC | PRN

## 2019-04-13 NOTE — ED Notes (Signed)
Patient's oxygen dropped to 87% on room air with exertion. Patient endorses shortness of breath at rest and on exertion. Encouraged deep breathing with no improvement. Placed patient on 2 liters of oxygen via nasal cannula with an improvement to 90%. Increased oxygen to 4 liters with an improvement to 95%. Head of bed increased for comfort. Call bell within reach. Patient denies any needs at this time.

## 2019-04-13 NOTE — ED Triage Notes (Signed)
Pt BIBA from home.   Per EMS- Pt dx with COVID on 1/4.  Was seen here yesterday for same. Pt c/o worsening SHOB.  Pt reports O2 sats decreased to 83% on RA at rest, c/o dyspnea with exertion. EMS reports 88% O2 on RA, pt took Tylenol appx 1200 today for fever.   100.9 fever temporal 3L O2--> 98%.  142/70 HR 100 R 20 CBG 461 AOx4 Ambulatory at baseline.

## 2019-04-13 NOTE — ED Provider Notes (Signed)
[facility] DEPT Provider Note   CSN: [id number] Arrival date & time: 04/13/19  1846     History Chief Complaint  Patient presents with  . COVID positive  . Shortness of Breath    Jade Sullivan is a 74 y.o. female.  74 yo F with a chief complaints of shortness of breath.  The patient was diagnosed yesterday with the novel coronavirus.  Since she got home she felt that she has been having more difficulty breathing and has had some trouble getting around the house.  Not feeling like eating and drinking.  No significant vomiting or diarrhea.  No abdominal pain.  She was picked up by EMS and at that time had an oxygen saturation of 83%.  Improved with 3 L of nasal cannula into the mid 90s.  The history is provided by the patient.  Shortness of Breath Severity:  Moderate Onset quality:  Gradual Duration:  2 days Timing:  Constant Progression:  Worsening Chronicity:  New Relieved by:  Nothing Worsened by:  Nothing Ineffective treatments:  None tried Associated symptoms: fever   Associated symptoms: no abdominal pain, no chest pain, no headaches, no vomiting and no wheezing        Past Medical History:  Diagnosis Date  . Allergy    seasonal  . Arthritis    knees  . Chronic kidney disease   . Diverticulosis of colon (without mention of hemorrhage)   . Dysmetabolic syndrome X   . Gout   . H/O hyperthyroidism    By patient h/o hyperthyroidism with protosis, s/p subtotal thyroidectomy. On no replacement therapy. Last thyroid functions 2013 in [place]  Plan Will need thyroid functions Feb '15 with recommendations to follow.    . Heart murmur    yeras ago told had murmur  . Hyperlipidemia   . Hypertension    on multiple meds  . Hypothyroidism     Patient Active Problem List   Diagnosis Date Noted  . Pneumonia due to COVID-19 virus 04/13/2019  . Acute respiratory failure with hypoxia ([place]) 04/13/2019  . COVID-19 04/09/2019  . Routine  general medical examination at a health care facility 03/17/2017  . History of acute gouty arthritis 11/03/2014  . Hypothyroidism 09/28/2014  . Lipoma of shoulder 09/27/2014  . H/O hyperthyroidism 02/17/2013  . Mixed hyperlipidemia 11/16/2012  . Impaired fasting blood sugar 11/16/2012  . 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 Obesity, unspecified 10/27/2012  . CKD (chronic kidney disease) stage 3, GFR 30-59 ml/min 10/27/2012  . Hypertension, renal disease     Past Surgical History:  Procedure Laterality Date  . [place]  . COLONOSCOPY    . EYE SURGERY    . HERNIA REPAIR  2000  . LIPOMA EXCISION Right 11/24/2014   Procedure: EXCISION RIGHT SHOULDER LIPOMA;  Surgeon: Erroll Luna, MD;  Location: [facility];  Service: General;  Laterality: Right;  . POLYPECTOMY    . THYROIDECTOMY, PARTIAL  1981     OB History    Gravida  1   Para  1   Term      Preterm      AB      Living  1     SAB      TAB      Ectopic      Multiple      Live Births              Family History  Problem Relation Age of Onset  . Diabetes Mother        DM2  . Hypertension Mother   . Pneumonia Mother        died from this condition  . Colon polyps Mother   . Heart disease Father        died from this condition  . Colon polyps Father   . Kidney disease Sister        HBP and DM2; relative died from this condition  . Heart disease Brother        MI - HBP and DM2  . Colon cancer Neg Hx   . Esophageal cancer Neg Hx   . Rectal cancer Neg Hx   . Stomach cancer Neg Hx     Social History   Tobacco Use  . Smoking status: Former Research scientist (life sciences)  . Smokeless tobacco: Never Used  Substance Use Topics  . Alcohol use: No    Alcohol/week: 0.0 standard drinks  . Drug use: No    Home Medications Prior to Admission medications   Medication Sig Start Date End Date Taking? Authorizing Provider  Aspirin-Acetaminophen-Caffeine (EXCEDRIN PO) Take 1  tablet by mouth as needed (for headache).    Yes [provider]  atorvastatin (LIPITOR) 20 MG tablet Take 1 tablet (20 mg total) by mouth daily. 05/12/18  Yes Hoyt Koch, MD  benzonatate (TESSALON) 100 MG capsule Take 1 capsule (100 mg total) by mouth 3 (three) times daily as needed for cough. 04/12/19  Yes Lucrezia Starch, MD  cholecalciferol (VITAMIN D3) 25 MCG (1000 UT) tablet Take 1,000 Units by mouth daily.   Yes [provider]  cloNIDine (CATAPRES - DOSED IN MG/24 HR) 0.3 mg/24hr patch Place 0.3 mg onto the skin once a week.  11/27/15  Yes [provider]  cloNIDine (CATAPRES) 0.2 MG tablet Take 0.2 mg by mouth 2 (two) times daily.   Yes [provider]  fish oil-omega-3 fatty acids 1000 MG capsule Take 2 g by mouth daily.   Yes [provider]  latanoprost (XALATAN) 0.005 % ophthalmic solution Place 1 drop into both eyes at bedtime.    Yes [provider]  levothyroxine (SYNTHROID, LEVOTHROID) 50 MCG tablet Take 1 tablet (50 mcg total) by mouth daily before breakfast. 05/12/18  Yes Hoyt Koch, MD  losartan (COZAAR) 100 MG tablet Take 1 tablet (100 mg total) by mouth daily. 05/12/18  Yes Hoyt Koch, MD  minoxidil (LONITEN) 2.5 MG tablet Take 2.5 mg by mouth 2 (two) times daily.   Yes [provider]  Misc Natural Products (TART CHERRY ADVANCED PO) Take by mouth.   Yes [provider]  Multiple Vitamin (MULTIVITAMIN) tablet Take 1 tablet by mouth daily.   Yes [provider]  nebivolol (BYSTOLIC) 5 MG tablet Take 5 mg by mouth daily.   Yes [provider]  spironolactone (ALDACTONE) 100 MG tablet Take 100 mg by mouth daily.   Yes [provider]  timolol (BETIMOL) 0.25 % ophthalmic solution 1-2 drops 2 (two) times daily.   Yes [provider]  gabapentin (NEURONTIN) 100 MG capsule Take 2 capsules (200 mg total) by mouth at bedtime. Patient not taking:  Reported on 04/13/2019 10/29/16   Lyndal Pulley, DO  promethazine-dextromethorphan (PROMETHAZINE-DM) 6.25-15 MG/5ML syrup Take 5 mLs by mouth 2 (two) times daily as needed for cough. Patient not taking: Reported on 04/13/2019 04/09/19   Hoyt Koch, MD    Allergies  Amlodipine and Hctz [hydrochlorothiazide]  Review of Systems   Review of Systems  Constitutional: Positive for chills and fever.  HENT: Negative for congestion and rhinorrhea.   Eyes: Negative for redness and visual disturbance.  Respiratory: Positive for shortness of breath. Negative for wheezing.   Cardiovascular: Negative for chest pain and palpitations.  Gastrointestinal: Negative for abdominal pain, diarrhea, nausea and vomiting.  Genitourinary: Negative for dysuria and urgency.  Musculoskeletal: Positive for myalgias. Negative for arthralgias.  Skin: Negative for pallor and wound.  Neurological: Negative for dizziness and headaches.    Physical Exam Updated Vital Signs BP 114/71 (BP Location: Left Arm)   Pulse 90   Temp (!) 103 F (39.4 C) Comment: Taylor, RN aware  Resp (!) 23   Ht 5\' 5"  (1.651 m)   Wt 95.3 kg   SpO2 95%   BMI 34.95 kg/m   Physical Exam Vitals and nursing note reviewed.  Constitutional:      General: She is not in acute distress.    Appearance: She is well-developed. She is not diaphoretic.  HENT:     Head: Normocephalic and atraumatic.  Eyes:     Pupils: Pupils are equal, round, and reactive to light.  Cardiovascular:     Rate and Rhythm: Normal rate and regular rhythm.     Heart sounds: No murmur. No friction rub. No gallop.   Pulmonary:     Effort: Pulmonary effort is normal. Tachypnea present.     Breath sounds: No wheezing or rales.  Abdominal:     General: There is no distension.     Palpations: Abdomen is soft.     Tenderness: There is no abdominal tenderness.  Musculoskeletal:        General: No tenderness.     Cervical back: Normal range of motion and neck  supple.  Skin:    General: Skin is warm and dry.  Neurological:     Mental Status: She is alert and oriented to person, place, and time.  Psychiatric:        Behavior: Behavior normal.     ED Results / Procedures / Treatments   Labs (all labs ordered are listed, but only abnormal results are displayed) Labs Reviewed  LACTIC ACID, PLASMA - Abnormal; Notable for the following components:      Result Value   Lactic Acid, Venous 2.8 (*)    All other components within normal limits  COMPREHENSIVE METABOLIC PANEL - Abnormal; Notable for the following components:   CO2 18 (*)    Glucose, Bld 364 (*)    BUN 32 (*)    Creatinine, Ser 1.94 (*)    Calcium 8.8 (*)    Albumin 3.4 (*)    AST 48 (*)    GFR calc non Af Amer 25 (*)    GFR calc Af Amer 29 (*)    Anion gap 16 (*)    All other components within normal limits  D-DIMER, QUANTITATIVE (NOT AT [facility]) - Abnormal; Notable for the following components:   D-Dimer, Quant 1.86 (*)    All other components within normal limits  LACTATE DEHYDROGENASE - Abnormal; Notable for the following components:   LDH 477 (*)    All other components within normal limits  FERRITIN - Abnormal; Notable for the following components:   Ferritin 488 (*)    All other components within normal limits  FIBRINOGEN - Abnormal; Notable for the following components:   Fibrinogen 589 (*)    All other components within  normal limits  C-REACTIVE PROTEIN - Abnormal; Notable for the following components:   CRP 25.1 (*)    All other components within normal limits  CULTURE, BLOOD (ROUTINE X 2)  CULTURE, BLOOD (ROUTINE X 2)  CBC WITH DIFFERENTIAL/PLATELET  PROCALCITONIN  TRIGLYCERIDES  LACTIC ACID, PLASMA  HEMOGLOBIN A1C    EKG EKG Interpretation  Date/Time:  Tuesday April 13 2019 19:49:46 EST Ventricular Rate:  97 PR Interval:    QRS Duration: 84 QT Interval:  339 QTC Calculation: 431 R Axis:   -123 Text Interpretation: Right and left arm electrode  reversal, interpretation assumes no reversal Sinus or ectopic atrial rhythm Prolonged PR interval Probable lateral infarct, age indeterminate Otherwise no significant change Confirmed by Deno Etienne [phone]) on 04/13/2019 8:11:28 PM   Radiology DG Chest Port 1 View  Result Date: 04/13/2019 CLINICAL DATA:  Shortness of breath, diagnosed with COVID-19 04/05/2019 EXAM: PORTABLE CHEST 1 VIEW COMPARISON:  Radiograph 04/12/2019 FINDINGS: Increasingly confluent airspace disease in the right lung and retrocardiac space with some developing air bronchograms. No pneumothorax. No visible effusion. Prominence of the cardiac silhouette may be related to combination of low volumes and portable technique. Stable tracheal tortuosity. No acute osseous or soft tissue abnormality. Degenerative changes are present in the imaged spine and shoulders. IMPRESSION: Increasingly confluent airspace disease in the right lung and retrocardiac space with some developing air bronchograms. Given the rapid development, findings are suspicious for pneumonia. Electronically Signed   By: Lovena Le M.D.   On: 04/13/2019 21:36   DG Chest Portable 1 View  Result Date: 04/12/2019 CLINICAL DATA:  Cough. COVID-19. Shortness of breath. EXAM: PORTABLE CHEST 1 VIEW COMPARISON:  None. FINDINGS: There are 3 patchy small hazy infiltrates in the right lung. There is increased density at the left lung base which may represent infiltrate. Heart size and vascularity are normal. Aortic atherosclerosis. No acute bone abnormality. IMPRESSION: 1. Three small patchy infiltrates in the right lung. 2. Increased density at the left lung base which may represent infiltrate. 3.  Aortic Atherosclerosis (ICD10-I70.0). Electronically Signed   By: Lorriane Shire M.D.   On: 04/12/2019 13:04    Procedures Procedures (including critical care time)  Medications Ordered in ED Medications  insulin aspart (novoLOG) injection 0-9 Units (has no administration in time  range)  acetaminophen (TYLENOL) tablet 1,000 mg (1,000 mg Oral Given 04/13/19 2124)  sodium chloride 0.9 % bolus 500 mL (500 mLs Intravenous New Bag/Given 04/13/19 2124)    ED Course  I have reviewed the triage vital signs and the nursing notes.  Pertinent labs & imaging results that were available during my care of the patient were reviewed by me and considered in my medical decision making (see chart for details).    MDM Rules/Calculators/A&P                      74 yo F with a chief complaint of shortness of breath.  Recently diagnosed with the novel coronavirus the patient has had worsening shortness of breath since she went home yesterday.  Found to have oxygen saturation of 83 by EMS.  On arrival here the patient not requiring any oxygen while at rest.  Will obtain lab work.  Expect admit.  Patient became hypoxic just with a rolling in the bed.  Would to the mid 80s and take some time to come back up with oxygen.  Titrated to 4 L at rest.  Lab work with acute kidney injury compared to yesterday.  We will give a small bolus of IV fluids.  Lactate is also mildly elevated.  CRITICAL CARE Performed by: Cecilio Asper   Total critical care time: 35 minutes  Critical care time was exclusive of separately billable procedures and treating other patients.  Critical care was necessary to treat or prevent imminent or life-threatening deterioration.  Critical care was time spent personally by me on the following activities: development of treatment plan with patient and/or surrogate as well as nursing, discussions with consultants, evaluation of patient's response to treatment, examination of patient, obtaining history from patient or surrogate, ordering and performing treatments and interventions, ordering and review of laboratory studies, ordering and review of radiographic studies, pulse oximetry and re-evaluation of patient's condition.  The patients results and plan were reviewed  and discussed.   Any x-rays performed were independently reviewed by myself.   Differential diagnosis were considered with the presenting HPI.  Medications  insulin aspart (novoLOG) injection 0-9 Units (has no administration in time range)  acetaminophen (TYLENOL) tablet 1,000 mg (1,000 mg Oral Given 04/13/19 2124)  sodium chloride 0.9 % bolus 500 mL (500 mLs Intravenous New Bag/Given 04/13/19 2124)    Vitals:   04/13/19 2038 04/13/19 2124 04/13/19 2200 04/13/19 2230  BP: 139/75 (!) 155/85 118/67 114/71  Pulse: 97 (!) 102 (!) 101 90  Resp: (!) 25 18 (!) 27 (!) 23  Temp:      SpO2: 96% 97% 93% 95%  Weight:      Height:        Final diagnoses:  Acute respiratory failure with hypoxia (H[facility])  COVID-19 virus infection      Final Clinical Impression(s) / ED Diagnoses Final diagnoses:  Acute respiratory failure with hypoxia ([facility])  COVID-19 virus infection    Rx / DC Orders ED Discharge Orders    None       Deno Etienne, DO 04/13/19 2238

## 2019-04-14 LAB — CBG MONITORING, ED
Glucose-Capillary: 208 mg/dL — ABNORMAL HIGH (ref 70–99)
Glucose-Capillary: 233 mg/dL — ABNORMAL HIGH (ref 70–99)
Glucose-Capillary: 317 mg/dL — ABNORMAL HIGH (ref 70–99)

## 2019-04-14 LAB — CBC WITH DIFFERENTIAL/PLATELET
Abs Immature Granulocytes: 0.03 10*3/uL (ref 0.00–0.07)
Basophils Absolute: 0 10*3/uL (ref 0.0–0.1)
Basophils Relative: 0 %
Eosinophils Absolute: 0 10*3/uL (ref 0.0–0.5)
Eosinophils Relative: 0 %
HCT: 37.7 % (ref 36.0–46.0)
Hemoglobin: 12.3 g/dL (ref 12.0–15.0)
Immature Granulocytes: 0 %
Lymphocytes Relative: 14 %
Lymphs Abs: 1 10*3/uL (ref 0.7–4.0)
MCH: 30.2 pg (ref 26.0–34.0)
MCHC: 32.6 g/dL (ref 30.0–36.0)
MCV: 92.6 fL (ref 80.0–100.0)
Monocytes Absolute: 0.3 10*3/uL (ref 0.1–1.0)
Monocytes Relative: 4 %
Neutro Abs: 6 10*3/uL (ref 1.7–7.7)
Neutrophils Relative %: 82 %
Platelets: 180 10*3/uL (ref 150–400)
RBC: 4.07 MIL/uL (ref 3.87–5.11)
RDW: 13.7 % (ref 11.5–15.5)
WBC: 7.4 10*3/uL (ref 4.0–10.5)
nRBC: 0 % (ref 0.0–0.2)

## 2019-04-14 LAB — COMPREHENSIVE METABOLIC PANEL
ALT: 21 U/L (ref 0–44)
AST: 48 U/L — ABNORMAL HIGH (ref 15–41)
Albumin: 3 g/dL — ABNORMAL LOW (ref 3.5–5.0)
Alkaline Phosphatase: 66 U/L (ref 38–126)
Anion gap: 10 (ref 5–15)
BUN: 30 mg/dL — ABNORMAL HIGH (ref 8–23)
CO2: 21 mmol/L — ABNORMAL LOW (ref 22–32)
Calcium: 8.4 mg/dL — ABNORMAL LOW (ref 8.9–10.3)
Chloride: 112 mmol/L — ABNORMAL HIGH (ref 98–111)
Creatinine, Ser: 1.64 mg/dL — ABNORMAL HIGH (ref 0.44–1.00)
GFR calc Af Amer: 36 mL/min — ABNORMAL LOW (ref >60)
GFR calc non Af Amer: 31 mL/min — ABNORMAL LOW (ref >60)
Glucose, Bld: 262 mg/dL — ABNORMAL HIGH (ref 70–99)
Potassium: 4.1 mmol/L (ref 3.5–5.1)
Sodium: 143 mmol/L (ref 135–145)
Total Bilirubin: 0.5 mg/dL (ref 0.3–1.2)
Total Protein: 7 g/dL (ref 6.5–8.1)

## 2019-04-14 LAB — GLUCOSE, CAPILLARY
Glucose-Capillary: 235 mg/dL — ABNORMAL HIGH (ref 70–99)
Glucose-Capillary: 281 mg/dL — ABNORMAL HIGH (ref 70–99)
Glucose-Capillary: 219 mg/dL — ABNORMAL HIGH (ref 70–99)

## 2019-04-14 LAB — LACTIC ACID, PLASMA: Lactic Acid, Venous: 1.5 mmol/L (ref 0.5–1.9)

## 2019-04-14 LAB — ABO/RH: ABO/RH(D): O POS

## 2019-04-14 LAB — PHOSPHORUS: Phosphorus: 2.3 mg/dL — ABNORMAL LOW (ref 2.5–4.6)

## 2019-04-14 LAB — C-REACTIVE PROTEIN: CRP: 24.5 mg/dL — ABNORMAL HIGH (ref <1.0)

## 2019-04-14 LAB — D-DIMER, QUANTITATIVE: D-Dimer, Quant: 1.65 ug/mL-FEU — ABNORMAL HIGH (ref 0.00–0.50)

## 2019-04-14 LAB — HEMOGLOBIN A1C
Hgb A1c MFr Bld: 9.8 % — ABNORMAL HIGH (ref 4.8–5.6)
Mean Plasma Glucose: 234.56 mg/dL

## 2019-04-14 LAB — MAGNESIUM: Magnesium: 2 mg/dL (ref 1.7–2.4)

## 2019-04-14 LAB — FERRITIN: Ferritin: 518 ng/mL — ABNORMAL HIGH (ref 11–307)

## 2019-04-14 MED ORDER — SPIRONOLACTONE 25 MG PO TABS: 100.0000 mg | ORAL_TABLET | Freq: Every day | ORAL | Status: DC

## 2019-04-14 MED ORDER — NEBIVOLOL HCL 5 MG PO TABS
5.0000 mg | ORAL_TABLET | Freq: Every day | ORAL | Status: DC
  Administered 2019-04-14 – 2019-04-20 (×7): 5 mg via ORAL
  Filled 2019-04-14 (×8): qty 1

## 2019-04-14 MED ORDER — CLONIDINE HCL 0.1 MG PO TABS
0.2000 mg | ORAL_TABLET | Freq: Two times a day (BID) | ORAL | Status: DC
  Administered 2019-04-14 – 2019-04-20 (×13): 0.2 mg via ORAL
  Filled 2019-04-14: qty 2
  Filled 2019-04-14 (×13): qty 1

## 2019-04-14 MED ORDER — SODIUM CHLORIDE 0.9% IV SOLUTION: Freq: Once | INTRAVENOUS | Status: DC

## 2019-04-14 MED ORDER — INSULIN ASPART 100 UNIT/ML ~~LOC~~ SOLN
4.0000 [IU] | Freq: Three times a day (TID) | SUBCUTANEOUS | Status: DC
  Administered 2019-04-14 (×2): 4 [IU] via SUBCUTANEOUS
  Filled 2019-04-14: qty 0.04

## 2019-04-14 MED ORDER — INSULIN ASPART 100 UNIT/ML ~~LOC~~ SOLN
0.0000 [IU] | Freq: Every day | SUBCUTANEOUS | Status: DC
  Administered 2019-04-14 – 2019-04-17 (×2): 2 [IU] via SUBCUTANEOUS
  Administered 2019-04-18 – 2019-04-21 (×3): 3 [IU] via SUBCUTANEOUS
  Filled 2019-04-14: qty 0.05

## 2019-04-14 MED ORDER — TOCILIZUMAB 400 MG/20ML IV SOLN: 8.0000 mg/kg | Freq: Once | INTRAVENOUS | Status: DC

## 2019-04-14 MED ORDER — INSULIN DETEMIR 100 UNIT/ML ~~LOC~~ SOLN
20.0000 [IU] | Freq: Two times a day (BID) | SUBCUTANEOUS | Status: DC
  Administered 2019-04-14: 20 [IU] via SUBCUTANEOUS
  Filled 2019-04-14 (×2): qty 0.2

## 2019-04-14 MED ORDER — HEPARIN SODIUM (PORCINE) 5000 UNIT/ML IJ SOLN
5000.0000 [IU] | Freq: Three times a day (TID) | INTRAMUSCULAR | Status: DC
  Administered 2019-04-14 – 2019-04-17 (×10): 5000 [IU] via SUBCUTANEOUS
  Filled 2019-04-14 (×10): qty 1

## 2019-04-14 MED ORDER — TOCILIZUMAB 400 MG/20ML IV SOLN
800.0000 mg | Freq: Once | INTRAVENOUS | Status: AC
  Administered 2019-04-14: 800 mg via INTRAVENOUS
  Filled 2019-04-14: qty 40

## 2019-04-14 MED ORDER — INSULIN ASPART 100 UNIT/ML ~~LOC~~ SOLN
0.0000 [IU] | Freq: Three times a day (TID) | SUBCUTANEOUS | Status: DC
  Administered 2019-04-14: 8 [IU] via SUBCUTANEOUS
  Administered 2019-04-14 – 2019-04-15 (×2): 5 [IU] via SUBCUTANEOUS
  Administered 2019-04-15 (×2): 8 [IU] via SUBCUTANEOUS
  Administered 2019-04-16 (×2): 2 [IU] via SUBCUTANEOUS
  Administered 2019-04-16: 3 [IU] via SUBCUTANEOUS
  Administered 2019-04-17: 2 [IU] via SUBCUTANEOUS
  Administered 2019-04-17: 3 [IU] via SUBCUTANEOUS
  Administered 2019-04-18 (×2): 8 [IU] via SUBCUTANEOUS
  Administered 2019-04-18: 5 [IU] via SUBCUTANEOUS
  Administered 2019-04-19: 2 [IU] via SUBCUTANEOUS
  Administered 2019-04-19: 8 [IU] via SUBCUTANEOUS
  Administered 2019-04-19: 5 [IU] via SUBCUTANEOUS
  Administered 2019-04-20: 3 [IU] via SUBCUTANEOUS
  Administered 2019-04-20: 8 [IU] via SUBCUTANEOUS
  Administered 2019-04-21: 15 [IU] via SUBCUTANEOUS
  Administered 2019-04-21: 3 [IU] via SUBCUTANEOUS
  Administered 2019-04-22: 8 [IU] via SUBCUTANEOUS
  Filled 2019-04-14: qty 0.15

## 2019-04-14 MED ORDER — METHYLPREDNISOLONE SODIUM SUCC 40 MG IJ SOLR
40.0000 mg | Freq: Two times a day (BID) | INTRAMUSCULAR | Status: DC
  Administered 2019-04-14 – 2019-04-17 (×8): 40 mg via INTRAVENOUS
  Filled 2019-04-14 (×8): qty 1

## 2019-04-14 MED ORDER — CLONIDINE HCL 0.3 MG/24HR TD PTWK
0.3000 mg | MEDICATED_PATCH | TRANSDERMAL | Status: DC
  Administered 2019-04-18: 11:00:00 0.3 mg via TRANSDERMAL
  Filled 2019-04-14 (×2): qty 1

## 2019-04-14 MED ORDER — DEXAMETHASONE SODIUM PHOSPHATE 10 MG/ML IJ SOLN: 6.0000 mg | Freq: Two times a day (BID) | INTRAMUSCULAR | Status: DC

## 2019-04-14 NOTE — Progress Notes (Signed)
Pt provided flutter valve for use.  Pt instructed on use and technique.  Pt then demonstrated technique and provided teach back method while doing so.  Pt had decent effort and was encouraged to continue using at least once every 2 hours.

## 2019-04-14 NOTE — ED Notes (Signed)
Patient provided oral hydration. Denies any needs at this time.

## 2019-04-14 NOTE — Progress Notes (Addendum)
[facility] PROGRESS NOTE    Progress Note  Jade Sullivan  [id number] DOB: 23-Oct-1945 DOA: 04/13/2019 PCP: Hoyt Koch, MD     Brief Narrative:   Jade Sullivan is an 74 y.o. female past medical history of COVID-19 infection on April 05, 2019, chronic disease stage III, hypothyroidism hypertension, came back to the emergency room when she was not able to tolerate her oral, was found to be satting greater than 95% hydrated and discharged back home came back on the day of admission to be hypoxic requiring 4 L of oxygen to keep saturations greater than 93%, tidal infiltrates.  Assessment/Plan:   Acte respiratory failure with hypoxia secondarily to Pneumonia due to COVID-19 virus She is requiring 4 L oxygen keep saturations greater than 93%, she continues to spike fevers. he was started on IV remdesivir and steroids will increase her dexamethasone.  Her inflammatory markers continue to be significantly elevated, despite IV remdesivir and steroids. We will go ahead and start her on convalescent plasma she has agreed to IV Actemra. The treatment plan and use of medications and known side effects were discussed with patient/family, they were clearly explained that there is no proven definitive treatment for COVID-19 infection, any medications used here are based on published clinical articles/anecdotal data which are not peer-reviewed or randomized control trials.  Complete risks and long-term side effects are unknown, however in the best clinical judgment they seem to be of some clinical benefit rather than medical risks.  Patient/family agree with the treatment plan and want to receive the given medications.  Yield. Try to keep the patient peripherally 16 hours a day, not prone out of bed to chair, consult physical therapy.  Acute on chronic kidney disease stage IIIa: Hold Nephrotoxic medications, baseline creatinine 1.1 on admission 1.9 likely prerenal azotemia. He was  started on IV fluid hydration her creatinine is improving slowly.  Hyperlipidemia: Continue current home regimen.  Obesity, unspecified: Counseling.  New onset diabetes mellitus: With an A1c of 9.8, she has been placed on steroids which are likely blood glucose erratic. We will start her on long-acting insulin continue sliding scale insulin.  DVT prophylaxis: LOVENOX Family Communication:none Disposition Plan/Barrier to D/C: unable to determine Code Status:     Code Status Orders  (From admission, onward)         Start     Ordered   04/13/19 2310  Full code  Continuous     04/13/19 2309        Code Status History    This patient has a current code status but no historical code status.   Advance Care Planning Activity        IV Access:    Peripheral IV   Procedures and diagnostic studies:   DG Chest Port 1 View  Result Date: 04/13/2019 CLINICAL DATA:  Shortness of breath, diagnosed with COVID-19 04/05/2019 EXAM: PORTABLE CHEST 1 VIEW COMPARISON:  Radiograph 04/12/2019 FINDINGS: Increasingly confluent airspace disease in the right lung and retrocardiac space with some developing air bronchograms. No pneumothorax. No visible effusion. Prominence of the cardiac silhouette may be related to combination of low volumes and portable technique. Stable tracheal tortuosity. No acute osseous or soft tissue abnormality. Degenerative changes are present in the imaged spine and shoulders. IMPRESSION: Increasingly confluent airspace disease in the right lung and retrocardiac space with some developing air bronchograms. Given the rapid development, findings are suspicious for pneumonia. Electronically Signed   By: Lovena Le M.D.   On:  04/13/2019 21:36   DG Chest Portable 1 View  Result Date: 04/12/2019 CLINICAL DATA:  Cough. COVID-19. Shortness of breath. EXAM: PORTABLE CHEST 1 VIEW COMPARISON:  None. FINDINGS: There are 3 patchy small hazy infiltrates in the right lung. There is  increased density at the left lung base which may represent infiltrate. Heart size and vascularity are normal. Aortic atherosclerosis. No acute bone abnormality. IMPRESSION: 1. Three small patchy infiltrates in the right lung. 2. Increased density at the left lung base which may represent infiltrate. 3.  Aortic Atherosclerosis (ICD10-I70.0). Electronically Signed   By: Lorriane Shire M.D.   On: 04/12/2019 13:04     Medical Consultants:    None.  Anti-Infectives:   None  Subjective:    Jade Sullivan she relates her breathing is getting worse than when she came in, she continues to be significantly dyspneic. Objective:    Vitals:   04/14/19 0500 04/14/19 0530 04/14/19 0649 04/14/19 0700  BP: 120/61 107/63 107/65 127/69  Pulse: 79 80 78 83  Resp: (!) 22 (!) 30 (!) 25 (!) 21  Temp:      TempSrc:      SpO2: 96% 97% 95% 93%  Weight:      Height:       SpO2: 93 % O2 Flow Rate (L/min): 4 L/min   Intake/Output Summary (Last 24 hours) at 04/14/2019 0932 Last data filed at 04/14/2019 0047 Gross per 24 hour  Intake 750 ml  Output --  Net 750 ml   Filed Weights   04/13/19 1938  Weight: 95.3 kg    Exam: General exam: In no acute distress. Respiratory system: Good air movement and diffuse crackles bilaterally Cardiovascular system: S1 & S2 heard, RRR. No JVD. Gastrointestinal system: Abdomen is nondistended, soft and nontender.  Central nervous system: Alert and oriented. No focal neurological deficits. Extremities: No pedal edema. Skin: No rashes, lesions or ulcers Psychiatry: Judgement and insight appear normal. Mood & affect appropriate.   Data Reviewed:    Labs: Basic Metabolic Panel: Recent Labs  Lab 04/12/19 1142 04/13/19 1953 04/14/19 0407  NA 140 141 143  K 3.8 4.1 4.1  CL 108 107 112*  CO2 20* 18* 21*  GLUCOSE 322* 364* 262*  BUN 26* 32* 30*  CREATININE 1.60* 1.94* 1.64*  CALCIUM 8.8* 8.8* 8.4*  MG  --   --  2.0  PHOS  --   --  2.3*    GFR Estimated Creatinine Clearance: 34.9 mL/min (A) (by C-G formula based on SCr of 1.64 mg/dL (H)). Liver Function Tests: Recent Labs  Lab 04/13/19 1953 04/14/19 0407  AST 48* 48*  ALT 21 21  ALKPHOS 72 66  BILITOT 0.8 0.5  PROT 7.5 7.0  ALBUMIN 3.4* 3.0*   No results for input(s): LIPASE, AMYLASE in the last 168 hours. No results for input(s): AMMONIA in the last 168 hours. Coagulation profile No results for input(s): INR, PROTIME in the last 168 hours. COVID-19 Labs  Recent Labs    04/13/19 1953 04/14/19 0407  DDIMER 1.86* 1.65*  FERRITIN 488* 518*  LDH 477*  --   CRP 25.1* 24.5*    No results found for: SARSCOV2NAA  CBC: Recent Labs  Lab 04/12/19 1142 04/13/19 1953 04/14/19 0407  WBC 5.1 6.8 7.4  NEUTROABS 3.6 5.4 6.0  HGB 13.2 12.9 12.3  HCT 39.9 38.7 37.7  MCV 91.3 91.9 92.6  PLT 166 184 180   Cardiac Enzymes: No results for input(s): CKTOTAL, CKMB, CKMBINDEX, TROPONINI in  the last 168 hours. BNP (last 3 results) No results for input(s): PROBNP in the last 8760 hours. CBG: Recent Labs  Lab 04/14/19 0002 04/14/19 0415 04/14/19 0819  GLUCAP 317* 233* 208*   D-Dimer: Recent Labs    04/13/19 1953 04/14/19 0407  DDIMER 1.86* 1.65*   Hgb A1c: Recent Labs    04/14/19 0407  HGBA1C 9.8*   Lipid Profile: Recent Labs    04/13/19 1953  TRIG 143   Thyroid function studies: No results for input(s): TSH, T4TOTAL, T3FREE, THYROIDAB in the last 72 hours.  Invalid input(s): FREET3 Anemia work up: Recent Labs    04/13/19 1953 04/14/19 0407  FERRITIN 488* 518*   Sepsis Labs: Recent Labs  Lab 04/12/19 1142 04/13/19 1953 04/13/19 2324 04/14/19 0407  PROCALCITON  --  0.24  --   --   WBC 5.1 6.8  --  7.4  LATICACIDVEN  --  2.8* 1.5  --    Microbiology No results found for this or any previous visit (from the past 240 hour(s)).   Medications:   . atorvastatin  20 mg Oral Daily  . dexamethasone (DECADRON) injection  6 mg  Intravenous Q24H  . heparin injection (subcutaneous)  5,000 Units Subcutaneous Q8H  . insulin aspart  0-9 Units Subcutaneous Q4H  . latanoprost  1 drop Both Eyes QHS  . levothyroxine  50 mcg Oral Q0600  . sodium chloride flush  3 mL Intravenous Q12H  . timolol  1 drop Both Eyes BID   Continuous Infusions: . sodium chloride 75 mL/hr at 04/14/19 0047  . remdesivir 100 mg in NS 100 mL       LOS: 1 day   Charlynne Cousins  [facility]  04/14/2019, 9:32 AM

## 2019-04-14 NOTE — H&P (Signed)
NIMAH ILL [id number] DOB: 1945-05-15 DOA: 04/13/2019     PCP: Hoyt Koch, MD   Outpatient Specialists:  NONE    Patient arrived to ER on 04/13/19 at [facility]  Patient coming from: home Lives  With family    Chief Complaint:   Chief Complaint  Patient presents with  . COVID positive  . Shortness of Breath    HPI: Jade Sullivan is a 74 y.o. female with medical history significant of known Covid infection diagnosed on the 05 April 2019 CKD stage 3, hypothyroidism, HLD HTN    Presented with   satting 83% on room air at rest and complaining of dyspnea with exertion when EMS arrived she was satting 36 patient just took a Tylenol for fever.  But still febrile 100.9  started on 3 L improved up to 98.  Heart rate was 100 CBG 461   Initially started to feel better around new year initially with cough runny nose decrease of appetite and headache started to have a fever initially 100.2 patient attempted to force himself to eat and drink but progressed to worsening symptoms. She was tested and found to be positive She had a visit to her primary care provider and was started on Bactrim on the eighth  She was seen in the emergency department yesterday Pulse ox at home reading 95% She was able to be discharged to home yesterday but presented back today;  Infectious risk factors:  Reports  fever, shortness of breath    KNOWN COVID POSITIVE    No results found for: SARSCOV2NAA   Regarding pertinent Chronic problems:     Hyperlipidemia -  on statins Lipitor   HTN on clonidine minoxidil, Bystolic Spironolactone    Hypothyroidism:  Lab Results  Component Value Date   TSH 6.55 (H) 05/12/2018   on synthroid    obesity-   BMI Readings from Last 1 Encounters:  04/13/19 34.95 kg/m      CKD stage III - baseline Cr 1.00 Lab Results  Component Value Date   CREATININE 1.94 (H) 04/13/2019   CREATININE 1.60 (H) 04/12/2019   CREATININE 1.03 05/12/2018    While  in ER: On emergency department patient continued to decline with oxygenation dropping down especially with exertion such as going to the bathroom patient required up to 4 L now satting 95%  The following Work up has been ordered so far:  Orders Placed This Encounter  Procedures  . Blood Culture (routine x 2)  . DG Chest Port 1 View  . Lactic acid, plasma  . CBC WITH DIFFERENTIAL  . Comprehensive metabolic panel  . D-dimer, quantitative  . Procalcitonin  . Lactate dehydrogenase  . Ferritin  . Triglycerides  . Fibrinogen  . C-reactive protein  . Diet NPO time specified  . Cardiac monitoring  . Insert peripheral IV x 2  . Initiate Carrier Fluid Protocol  . Place surgical mask on patient  . Patient to wear surgical mask during transportation  . Assess patient for ability to self-prone. If able (can move self in bed, ambulate) and stable (SpO2 and oxygen requirement):  . RN/NT - Document specific oxygen requirements in CHL  . Notify EDP if new oxygen requirements escalates > 4L per minute [place]  . RN to draw the following extra tubes:  . Consult to hospitalist  ALL PATIENTS BEING ADMITTED/HAVING PROCEDURES NEED COVID-19 SCREENING  . Airborne and Contact precautions  . Pulse oximetry, continuous  . ED EKG 12-Lead  .  EKG 12-Lead    Following Medications were ordered in ER: Medications  acetaminophen (TYLENOL) tablet 1,000 mg (1,000 mg Oral Given 04/13/19 2124)  sodium chloride 0.9 % bolus 500 mL (500 mLs Intravenous New Bag/Given 04/13/19 2124)        Consult Orders  (From admission, onward)         Start     Ordered   04/13/19 2144  Consult to hospitalist  ALL PATIENTS BEING ADMITTED/HAVING PROCEDURES NEED COVID-19 SCREENING  Once    Comments: ALL PATIENTS BEING ADMITTED/HAVING PROCEDURES NEED COVID-19 SCREENING  Provider:  (Not yet assigned)  Question Answer Comment  Place call to: [facility]   Reason for Consult Admit      04/13/19 2143          Significant  initial  Findings: Abnormal Labs Reviewed  LACTIC ACID, PLASMA - Abnormal; Notable for the following components:      Result Value   Lactic Acid, Venous 2.8 (*)    All other components within normal limits  COMPREHENSIVE METABOLIC PANEL - Abnormal; Notable for the following components:   CO2 18 (*)    Glucose, Bld 364 (*)    BUN 32 (*)    Creatinine, Ser 1.94 (*)    Calcium 8.8 (*)    Albumin 3.4 (*)    AST 48 (*)    GFR calc non Af Amer 25 (*)    GFR calc Af Amer 29 (*)    Anion gap 16 (*)    All other components within normal limits  D-DIMER, QUANTITATIVE (NOT AT [facility]) - Abnormal; Notable for the following components:   D-Dimer, Quant 1.86 (*)    All other components within normal limits  LACTATE DEHYDROGENASE - Abnormal; Notable for the following components:   LDH 477 (*)    All other components within normal limits  FERRITIN - Abnormal; Notable for the following components:   Ferritin 488 (*)    All other components within normal limits  FIBRINOGEN - Abnormal; Notable for the following components:   Fibrinogen 589 (*)    All other components within normal limits  C-REACTIVE PROTEIN - Abnormal; Notable for the following components:   CRP 25.1 (*)    All other components within normal limits    Otherwise labs showing:    Recent Labs  Lab 04/12/19 1142 04/13/19 1953  NA 140 141  K 3.8 4.1  CO2 20* 18*  GLUCOSE 322* 364*  BUN 26* 32*  CREATININE 1.60* 1.94*  CALCIUM 8.8* 8.8*    Cr    Up from baseline see below Lab Results  Component Value Date   CREATININE 1.94 (H) 04/13/2019   CREATININE 1.60 (H) 04/12/2019   CREATININE 1.03 05/12/2018    Recent Labs  Lab 04/13/19 1953  AST 48*  ALT 21  ALKPHOS 72  BILITOT 0.8  PROT 7.5  ALBUMIN 3.4*   Lab Results  Component Value Date   CALCIUM 8.8 (L) 04/13/2019     WBC      Component Value Date/Time   WBC 6.8 04/13/2019 1953   ANC    Component Value Date/Time   NEUTROABS 5.4 04/13/2019 1953    NEUTROABS 3 11/11/2017 0000   ALC No components found for: LYMPHAB     Plt: Lab Results  Component Value Date   PLT 184 04/13/2019    Lactic Acid, Venous    Component Value Date/Time   LATICACIDVEN 2.8 (HH) 04/13/2019 1953    Procalcitonin 0.24  COVID-19 Labs  Recent Labs    04/13/19 1953  DDIMER 1.86*  FERRITIN 488*  LDH 477*  CRP 25.1*    No results found for: SARSCOV2NAA   HG/HCT  stable,       Component Value Date/Time   HGB 12.9 04/13/2019 1953   HCT 38.7 04/13/2019 1953      Troponin 4    ECG: Ordered Personally reviewed by me showing: HR :  97 Rhythm:  NSR   no evidence of ischemic changes QTC 431   DM  labs:  HbA1C: Recent Labs    05/12/18 1134  HGBA1C 6.7*       UA  not ordered      CXR -bilateral pneumonia     ED Triage Vitals  Enc Vitals Group     BP 04/13/19 1937 140/83     Pulse Rate 04/13/19 1937 100     Resp 04/13/19 1937 16     Temp 04/13/19 1937 (!) 103 F (39.4 C)     Temp src --      SpO2 04/13/19 1937 95 %     Weight 04/13/19 1938 210 lb (95.3 kg)     Height 04/13/19 1938 5\' 5"  (1.651 m)     Head Circumference --      Peak Flow --      Pain Score 04/13/19 2138 0     Pain Loc --      Pain Edu? --      Excl. in [place]? --   TMAX(24)@       Latest  Blood pressure (!) 155/85, pulse (!) 102, temperature (!) 103 F (39.4 C), resp. rate 18, height 5\' 5"  (1.651 m), weight 95.3 kg, SpO2 97 %.     Hospitalist was called for admission for Covid infection with Covid pneumonia resulting in an acute respiratory failure hypoxia   Review of Systems:    Pertinent positives include:  Fevers, chills, fatigue, shortness of breath at rest.   dyspnea on exertion  Constitutional:  No weight loss, night sweats, weight loss  HEENT:  No headaches, Difficulty swallowing,Tooth/dental problems,Sore throat,  No sneezing, itching, ear ache, nasal congestion, post nasal drip,  Cardio-vascular:  No chest pain, Orthopnea, PND,  anasarca, dizziness, palpitations.no Bilateral lower extremity swelling  GI:  No heartburn, indigestion, abdominal pain, nausea, vomiting, diarrhea, change in bowel habits, loss of appetite, melena, blood in stool, hematemesis Resp:  no , No excess mucus, no productive cough, No non-productive cough, No coughing up of blood.No change in color of mucus.No wheezing. Skin:  no rash or lesions. No jaundice GU:  no dysuria, change in color of urine, no urgency or frequency. No straining to urinate.  No flank pain.  Musculoskeletal:  No joint pain or no joint swelling. No decreased range of motion. No back pain.  Psych:  No change in mood or affect. No depression or anxiety. No memory loss.  Neuro: no localizing neurological complaints, no tingling, no weakness, no double vision, no gait abnormality, no slurred speech, no confusion  All systems reviewed and apart from [place] all are negative  Past Medical History:   Past Medical History:  Diagnosis Date  . Allergy    seasonal  . Arthritis    knees  . Chronic kidney disease   . Diverticulosis of colon (without mention of hemorrhage)   . Dysmetabolic syndrome X   . Gout   . H/O hyperthyroidism    By patient h/o hyperthyroidism with protosis, s/p subtotal  thyroidectomy. On no replacement therapy. Last thyroid functions 2013 in [place]  Plan Will need thyroid functions Feb '15 with recommendations to follow.    . Heart murmur    yeras ago told had murmur  . Hyperlipidemia   . Hypertension    on multiple meds  . Hypothyroidism       Past Surgical History:  Procedure Laterality Date  . [place]  . COLONOSCOPY    . EYE SURGERY    . HERNIA REPAIR  2000  . LIPOMA EXCISION Right 11/24/2014   Procedure: EXCISION RIGHT SHOULDER LIPOMA;  Surgeon: Erroll Luna, MD;  Location: [facility];  Service: General;  Laterality: Right;  . POLYPECTOMY    . THYROIDECTOMY, PARTIAL  1981    Social History:  Ambulatory    independently       reports that she has quit smoking. She has never used smokeless tobacco. She reports that she does not drink alcohol or use drugs.     Family History:   Family History  Problem Relation Age of Onset  . Diabetes Mother        DM2  . Hypertension Mother   . Pneumonia Mother        died from this condition  . Colon polyps Mother   . Heart disease Father        died from this condition  . Colon polyps Father   . Kidney disease Sister        HBP and DM2; relative died from this condition  . Heart disease Brother        MI - HBP and DM2  . Colon cancer Neg Hx   . Esophageal cancer Neg Hx   . Rectal cancer Neg Hx   . Stomach cancer Neg Hx     Allergies: Allergies  Allergen Reactions  . Amlodipine     Gingival hyperplasia  . Hctz [Hydrochlorothiazide]     States caused her to have gout     Prior to Admission medications   Medication Sig Start Date End Date Taking? Authorizing Provider  Aspirin-Acetaminophen-Caffeine (EXCEDRIN PO) Take 1 tablet by mouth as needed.    [provider]  atorvastatin (LIPITOR) 20 MG tablet Take 1 tablet (20 mg total) by mouth daily. 05/12/18   Hoyt Koch, MD  benzonatate (TESSALON) 100 MG capsule Take 1 capsule (100 mg total) by mouth 3 (three) times daily as needed for cough. 04/12/19   Lucrezia Starch, MD  cholecalciferol (VITAMIN D3) 25 MCG (1000 UT) tablet Take 1,000 Units by mouth daily.    [provider]  cloNIDine (CATAPRES - DOSED IN MG/24 HR) 0.3 mg/24hr patch  11/27/15   [provider]  cloNIDine (CATAPRES) 0.2 MG tablet Take 0.2 mg by mouth 2 (two) times daily.    [provider]  fish oil-omega-3 fatty acids 1000 MG capsule Take 2 g by mouth daily.    [provider]  gabapentin (NEURONTIN) 100 MG capsule Take 2 capsules (200 mg total) by mouth at bedtime. 10/29/16   Lyndal Pulley, DO  latanoprost (XALATAN) 0.005 % ophthalmic solution 1 drop at bedtime.     [provider]  levothyroxine (SYNTHROID, LEVOTHROID) 50 MCG tablet Take 1 tablet (50 mcg total) by mouth daily before breakfast. 05/12/18   Hoyt Koch, MD  losartan (COZAAR) 100 MG tablet Take 1 tablet (100 mg total) by mouth daily. 05/12/18   Hoyt Koch, MD  minoxidil [place])  2.5 MG tablet Take 2.5 mg by mouth 2 (two) times daily.    [provider]  Misc Natural Products (TART CHERRY ADVANCED PO) Take by mouth.    [provider]  Multiple Vitamin (MULTIVITAMIN) tablet Take 1 tablet by mouth daily.    [provider]  nebivolol (BYSTOLIC) 5 MG tablet Take 5 mg by mouth daily.    [provider]  promethazine-dextromethorphan (PROMETHAZINE-DM) 6.25-15 MG/5ML syrup Take 5 mLs by mouth 2 (two) times daily as needed for cough. 04/09/19   Hoyt Koch, MD  spironolactone (ALDACTONE) 100 MG tablet Take 100 mg by mouth daily.    [provider]  timolol (BETIMOL) 0.25 % ophthalmic solution 1-2 drops 2 (two) times daily.    [provider]   Physical Exam: Blood pressure (!) 155/85, pulse (!) 102, temperature (!) 103 F (39.4 C), resp. rate 18, height 5\' 5"  (1.651 m), weight 95.3 kg, SpO2 97 %. 1. General:  in No  Acute distress   Chronically ill -appearing 2. Psychological: Alert and   Oriented 3. Head/ENT:     Dry Mucous Membranes                          Head Non traumatic, neck supple                            Poor Dentition 4. SKIN:  decreased Skin turgor,  Skin clean Dry and intact no rash 5. Heart: Regular rate and rhythm no Murmur, no Rub or gallop 6. Lungs: Clear to auscultation bilaterally, no wheezes or crackles   7. Abdomen: Soft, non-tender, Non distended   Obese bowel sounds present 8. Lower extremities: no clubbing, cyanosis, no  edema 9. Neurologically Grossly intact, moving all 4 extremities equally   10. MSK: Normal range of motion   All other LABS:     Recent Labs  Lab  04/12/19 1142 04/13/19 1953  WBC 5.1 6.8  NEUTROABS 3.6 5.4  HGB 13.2 12.9  HCT 39.9 38.7  MCV 91.3 91.9  PLT 166 184     Recent Labs  Lab 04/12/19 1142 04/13/19 1953  NA 140 141  K 3.8 4.1  CL 108 107  CO2 20* 18*  GLUCOSE 322* 364*  BUN 26* 32*  CREATININE 1.60* 1.94*  CALCIUM 8.8* 8.8*     Recent Labs  Lab 04/13/19 1953  AST 48*  ALT 21  ALKPHOS 72  BILITOT 0.8  PROT 7.5  ALBUMIN 3.4*       Cultures: No results found for: SDES, SPECREQUEST, CULT, REPTSTATUS   Radiological Exams on Admission: DG Chest Port 1 View  Result Date: 04/13/2019 CLINICAL DATA:  Shortness of breath, diagnosed with COVID-19 04/05/2019 EXAM: PORTABLE CHEST 1 VIEW COMPARISON:  Radiograph 04/12/2019 FINDINGS: Increasingly confluent airspace disease in the right lung and retrocardiac space with some developing air bronchograms. No pneumothorax. No visible effusion. Prominence of the cardiac silhouette may be related to combination of low volumes and portable technique. Stable tracheal tortuosity. No acute osseous or soft tissue abnormality. Degenerative changes are present in the imaged spine and shoulders. IMPRESSION: Increasingly confluent airspace disease in the right lung and retrocardiac space with some developing air bronchograms. Given the rapid development, findings are suspicious for pneumonia. Electronically Signed   By: Lovena Le M.D.   On: 04/13/2019 21:36   DG Chest Portable 1 View  Result Date: 04/12/2019  CLINICAL DATA:  Cough. COVID-19. Shortness of breath. EXAM: PORTABLE CHEST 1 VIEW COMPARISON:  None. FINDINGS: There are 3 patchy small hazy infiltrates in the right lung. There is increased density at the left lung base which may represent infiltrate. Heart size and vascularity are normal. Aortic atherosclerosis. No acute bone abnormality. IMPRESSION: 1. Three small patchy infiltrates in the right lung. 2. Increased density at the left lung base which may represent infiltrate. 3.   Aortic Atherosclerosis (ICD10-I70.0). Electronically Signed   By: Lorriane Shire M.D.   On: 04/12/2019 13:04    Chart has been reviewed    Assessment/Plan   74 y.o. female with medical history significant of known Covid infection diagnosed on the 05 April 2019 CKD stage 3, hypothyroidism, HLD HTN     Admitted for COVID PNA  Present on Admission: . Pneumonia due to COVID-19 virus -  FROM HOME WITH KNOWN HX OF COVID19 Following concerning LAB/ imaging findings:     BMP: increased BUN/Cr  LFTs: increased AST  CRP, LDH: increased   IL-6 and Ferritin increased   Procalcitonin: low normal  CXR abnormal showing viral PNA   Following complications noted:  evidence of AKI - will provide gentle rehydration  acute respiratory failure with hypoxia - continue oxygen treatment  Plan of treatment: - Transfer to [facility] facility if   bed is available and meets criteria for transfer for now  Admit on Airborn Precautions to Current facility    -given severity of illness initiate steroids Decadron 6mg  q 24 hours And pharmacy consult for remdesivir   - Will follow daily d.dimer - Assess for ability to prone  - Supportive management -Fluid sparing resuscitation  -Provide oxygen as needed currently on   SpO2: 98 % O2 Flow Rate (L/min): 4 L/min - IF d.dimer elvated >5 will increase dose of lovenox  Poor Prognostic factors  74 y.o.   Personal hx of  DM2,  HTN, obesity  Evidence of  organ damage  Present AKI, respiratory failure requiring  4L [place]      Will order Airborne and Contact precautions   . Obesity, unspecified follow-up as an outpatient nutrition  . CKD (chronic kidney disease) stage 3, GFR 30-59 ml/min -avoid nephrotoxic medications currently AKI likely secondary to dehydration in the setting of Covid will rehydrate and follow   . Mixed hyperlipidemia -continue home medications when able to tolerate   . Hypothyroidism -continue home medications unable to tolerate   New  onset diabetes mellitus patient in the past was told she was prediabetic but was not taking any medications.  Will initiate sliding scale check hemoglobin A1c check TSH patient may need initiation of long-lasting insulin  . Acute respiratory failure with hypoxia (HCC) secondary to Covid   Other plan as per orders.  DVT prophylaxis:   Lovenox     Code Status:  FULL CODE   as per patient   I had personally discussed CODE STATUS with patient   Family Communication:   Family not at  Bedside    Disposition Plan:      To home once workup is complete and patient is stable                      Consults called: none  Admission status:  ED Disposition    None         inpatient     Expect 2 midnight stay secondary to severity of patient's current illness including  hemodynamic instability despite optimal treatment (tachycardia  tachypnea  Hypoxia   Severe lab/radiological/exam abnormalities including:   Bilateral pneumonia and extensive comorbidities including:  Obesity  CKD .     That are currently affecting medical management.   I expect  patient to be hospitalized for 2 midnights requiring inpatient medical care.  Patient is at high risk for adverse outcome (such as loss of life or disability) if not treated.  Indication for inpatient stay as follows:    Hemodynamic instability despite maximal medical therapy,    inability to maintain oral hydration due to decreased appetite   New or worsening hypoxia  Need for IV antivirals IV fluids,       Level of care       SDU tele indefinitely please discontinue once patient no longer qualifies   Precautions: admitted as  covid positive Airborne and Contact precautions    PPE: Used by the provider:   P100  eye Goggles,  Gloves  gown    Reeta Kuk 04/14/2019, 2:38 AM    [facility]     after 2 AM please page floor coverage PA If 7AM-7PM, please contact the day team taking care of the patient  using [email or web address]

## 2019-04-14 NOTE — Progress Notes (Addendum)
   04/14/19 2052  MEWS Score  Temp 98 F (36.7 C)  BP (!) 150/78  Pulse Rate 86  ECG Heart Rate 92  Resp (!) 35  SpO2 93 %  O2 Device Nasal Cannula  O2 Flow Rate (L/min) 4 L/min  MEWS Score  MEWS Temp 0  MEWS Systolic 0  MEWS Pulse 0  MEWS RR 2  MEWS LOC 0  MEWS Score 2  MEWS Score Color Yellow  MEWS Assessment  Is this an acute change? Yes  MEWS guidelines implemented *See Row Information* Yellow  Provider Notification  Provider Name/Title M. [name], NP  Date Provider Notified 04/14/19  Time Provider Notified 2113  Notification Type Page  Notification Reason Change in status    RN will continue to monitor. Pt instructed on deep breathing exercises and Incentive Spirometer.  NP responded to page at 2117. RN received orders to increase O2 if needed. RN asked NP about pt's telemetry orders. NP states the patient will receive a box as soon as one is available. RN asked for clarification if the patient needed to be transferred to telemetry. NP stated the patient is on the unit she needs to be on. RN informed NP that 5W does not do telemetry. NP again told RN that the patient is on the unit she needs to be on.

## 2019-04-15 LAB — COMPREHENSIVE METABOLIC PANEL
ALT: 23 U/L (ref 0–44)
AST: 48 U/L — ABNORMAL HIGH (ref 15–41)
Albumin: 3.1 g/dL — ABNORMAL LOW (ref 3.5–5.0)
Alkaline Phosphatase: 71 U/L (ref 38–126)
Anion gap: 9 (ref 5–15)
BUN: 35 mg/dL — ABNORMAL HIGH (ref 8–23)
CO2: 21 mmol/L — ABNORMAL LOW (ref 22–32)
Calcium: 8.4 mg/dL — ABNORMAL LOW (ref 8.9–10.3)
Chloride: 115 mmol/L — ABNORMAL HIGH (ref 98–111)
Creatinine, Ser: 1.31 mg/dL — ABNORMAL HIGH (ref 0.44–1.00)
GFR calc Af Amer: 47 mL/min — ABNORMAL LOW (ref >60)
GFR calc non Af Amer: 40 mL/min — ABNORMAL LOW (ref >60)
Glucose, Bld: 218 mg/dL — ABNORMAL HIGH (ref 70–99)
Potassium: 4.2 mmol/L (ref 3.5–5.1)
Sodium: 145 mmol/L (ref 135–145)
Total Bilirubin: 0.7 mg/dL (ref 0.3–1.2)
Total Protein: 7.3 g/dL (ref 6.5–8.1)

## 2019-04-15 LAB — GLUCOSE, CAPILLARY
Glucose-Capillary: 191 mg/dL — ABNORMAL HIGH (ref 70–99)
Glucose-Capillary: 259 mg/dL — ABNORMAL HIGH (ref 70–99)
Glucose-Capillary: 263 mg/dL — ABNORMAL HIGH (ref 70–99)
Glucose-Capillary: 267 mg/dL — ABNORMAL HIGH (ref 70–99)
Glucose-Capillary: 206 mg/dL — ABNORMAL HIGH (ref 70–99)
Glucose-Capillary: 117 mg/dL — ABNORMAL HIGH (ref 70–99)
Glucose-Capillary: 67 mg/dL — ABNORMAL LOW (ref 70–99)

## 2019-04-15 LAB — CBC WITH DIFFERENTIAL/PLATELET
Abs Immature Granulocytes: 0.06 10*3/uL (ref 0.00–0.07)
Basophils Absolute: 0 10*3/uL (ref 0.0–0.1)
Basophils Relative: 0 %
Eosinophils Absolute: 0 10*3/uL (ref 0.0–0.5)
Eosinophils Relative: 0 %
HCT: 38.4 % (ref 36.0–46.0)
Hemoglobin: 12.4 g/dL (ref 12.0–15.0)
Immature Granulocytes: 1 %
Lymphocytes Relative: 15 %
Lymphs Abs: 1.3 10*3/uL (ref 0.7–4.0)
MCH: 30 pg (ref 26.0–34.0)
MCHC: 32.3 g/dL (ref 30.0–36.0)
MCV: 92.8 fL (ref 80.0–100.0)
Monocytes Absolute: 0.4 10*3/uL (ref 0.1–1.0)
Monocytes Relative: 4 %
Neutro Abs: 6.9 10*3/uL (ref 1.7–7.7)
Neutrophils Relative %: 80 %
Platelets: 227 10*3/uL (ref 150–400)
RBC: 4.14 MIL/uL (ref 3.87–5.11)
RDW: 14.3 % (ref 11.5–15.5)
WBC: 8.6 10*3/uL (ref 4.0–10.5)
nRBC: 0 % (ref 0.0–0.2)

## 2019-04-15 LAB — D-DIMER, QUANTITATIVE: D-Dimer, Quant: 1.97 ug/mL-FEU — ABNORMAL HIGH (ref 0.00–0.50)

## 2019-04-15 LAB — PREPARE FRESH FROZEN PLASMA

## 2019-04-15 LAB — BPAM FFP
Blood Product Expiration Date: 202101141143
ISSUE DATE / TIME: 202101131803
Unit Type and Rh: 5100

## 2019-04-15 LAB — C-REACTIVE PROTEIN: CRP: 24.3 mg/dL — ABNORMAL HIGH (ref <1.0)

## 2019-04-15 LAB — MAGNESIUM: Magnesium: 2.2 mg/dL (ref 1.7–2.4)

## 2019-04-15 LAB — PHOSPHORUS: Phosphorus: 1.9 mg/dL — ABNORMAL LOW (ref 2.5–4.6)

## 2019-04-15 LAB — FERRITIN: Ferritin: 770 ng/mL — ABNORMAL HIGH (ref 11–307)

## 2019-04-15 MED ORDER — INSULIN ASPART 100 UNIT/ML ~~LOC~~ SOLN
6.0000 [IU] | Freq: Three times a day (TID) | SUBCUTANEOUS | Status: DC
  Administered 2019-04-15 – 2019-04-22 (×21): 6 [IU] via SUBCUTANEOUS

## 2019-04-15 MED ORDER — INSULIN DETEMIR 100 UNIT/ML ~~LOC~~ SOLN
30.0000 [IU] | Freq: Two times a day (BID) | SUBCUTANEOUS | Status: DC
  Administered 2019-04-15 – 2019-04-16 (×3): 30 [IU] via SUBCUTANEOUS
  Filled 2019-04-15 (×5): qty 0.3

## 2019-04-15 NOTE — Progress Notes (Signed)
RN paged M. [name], NP regarding the Telemetry orders to receive clarification on whether the patient needs to be transferred  to telemetry or to stepdown d/t 3 conflicting cardiac monitoring orders.   No orders received/no clarification given.

## 2019-04-15 NOTE — Plan of Care (Signed)
  Problem: Education: Goal: Knowledge of risk factors and measures for prevention of condition will improve Outcome: Progressing   Problem: Coping: Goal: Psychosocial and spiritual needs will be supported Outcome: Progressing   Problem: Respiratory: Goal: Will maintain a patent airway Outcome: Progressing   

## 2019-04-15 NOTE — Progress Notes (Signed)
Pt transferred from bed to [facility]. Pt's O2 sat dropped to 88% on 5L [place]. RN increased O2 to 5.5L [place] O2 sat 94%.

## 2019-04-15 NOTE — Progress Notes (Addendum)
RN paged M. [name], NP regarding the Telemetry orders to receive clarification on whether the patient needs to be transferred  to telemetry or to stepdown d/t 3 conflicting cardiac monitoring orders. RN made NP aware that 5W does not do telemetry. CN is aware.  Orders Received: Cardiac Monitoring discontinued

## 2019-04-15 NOTE — Progress Notes (Signed)
[facility] PROGRESS NOTE    Progress Note  AHLAYA Sullivan  [id number] DOB: 1945/12/29 DOA: 04/13/2019 PCP: Hoyt Koch, MD     Brief Narrative:   Jade Sullivan is an 74 y.o. female past medical history of COVID-19 infection on April 05, 2019, chronic disease stage III, hypothyroidism hypertension, came back to the emergency room when she was not able to tolerate her oral, was found to be satting greater than 95% hydrated and discharged back home came back on the day of admission to be hypoxic requiring 4 L of oxygen to keep saturations greater than 93%, tidal infiltrates.  Assessment/Plan:   Acte respiratory failure with hypoxia secondarily to Pneumonia due to COVID-19 virus She is on 4 L of oxygen to keep saturations greater than 95%, she has defervesced this morning and overnight. She is started empirically on IV remdesivir and steroids received convalescent plasma and IV Actemra on 04/13/2018. She relates she can tell a difference in her breathing. Despite this her inflammatory markers were elevated yesterday, they are pending at the time of this dictatio. Try to keep the patient peripherally 16 hours a day, if not prone out of bed to chair, consult physical therapy. The treatment plan and use of medications and known side effects were discussed with patient/family, they were clearly explained that there is no proven definitive treatment for COVID-19 infection, any medications used here are based on published clinical articles/anecdotal data which are not peer-reviewed or randomized control trials.  Complete risks and long-term side effects are unknown, however in the best clinical judgment they seem to be of some clinical benefit rather than medical risks.  Patient/family agree with the treatment plan and want to receive the given medications.   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 KVO IV fluids.  Hyperlipidemia: Continue current home regimen.  Obesity, unspecified: Counseling.  New onset diabetes mellitus: With an A1c of 9.8, she is on steroids which will make her blood glucose erratic, she is on long-acting insulin plus sliding scale, despite this her blood sugars ranging greater than 250, will increase her long-acting and meal coverage.  DVT prophylaxis: LOVENOX Family Communication:none Disposition Plan/Barrier to D/C: unable to determine Code Status:     Code Status Orders  (From admission, onward)         Start     Ordered   04/13/19 2310  Full code  Continuous     04/13/19 2309        Code Status History    This patient has a current code status but no historical code status.   Advance Care Planning Activity        IV Access:    Peripheral IV   Procedures and diagnostic studies:   DG Chest Port 1 View  Result Date: 04/13/2019 CLINICAL DATA:  Shortness of breath, diagnosed with COVID-19 04/05/2019 EXAM: PORTABLE CHEST 1 VIEW COMPARISON:  Radiograph 04/12/2019 FINDINGS: Increasingly confluent airspace disease in the right lung and retrocardiac space with some developing air bronchograms. No pneumothorax. No visible effusion. Prominence of the cardiac silhouette may be related to combination of low volumes and portable technique. Stable tracheal tortuosity. No acute osseous or soft tissue abnormality. Degenerative changes are present in the imaged spine and shoulders. IMPRESSION: Increasingly confluent airspace disease in the right lung and  retrocardiac space with some developing air bronchograms. Given the rapid development, findings are suspicious for pneumonia. Electronically Signed   By: Lovena Le M.D.   On: 04/13/2019 21:36     Medical Consultants:    None.  Anti-Infectives:   None   Subjective:    Jade Sullivan she relates her breathing is improved compared to on admission Objective:    Vitals:   04/14/19 2052 04/14/19 2219 04/15/19 0146 04/15/19 0516  BP: (!) 150/78 140/75  (!) 141/79  Pulse: 86 79 62 69  Resp: (!) 35 20 20 18   Temp: 98 F (36.7 C) 99.2 F (37.3 C)  98.4 F (36.9 C)  TempSrc: Oral Oral  Oral  SpO2: 93% 92% 94% 92%  Weight:      Height:       SpO2: 92 % O2 Flow Rate (L/min): 5 L/min   Intake/Output Summary (Last 24 hours) at 04/15/2019 0758 Last data filed at 04/15/2019 0520 Gross per 24 hour  Intake 2688.83 ml  Output 500 ml  Net 2188.83 ml   Filed Weights   04/13/19 1938  Weight: 95.3 kg    Exam: General exam: In no acute distress. Respiratory system: Good air movement and clear to auscultation. Cardiovascular system: S1 & S2 heard, RRR. No JVD. Gastrointestinal system: Abdomen is nondistended, soft and nontender.  Central nervous system: Alert and oriented. No focal neurological deficits. Extremities: No pedal edema. Skin: No rashes, lesions or ulcers Psychiatry: Judgement and insight appear normal. Mood & affect appropriate.  Data Reviewed:    Labs: Basic Metabolic Panel: Recent Labs  Lab 04/12/19 1142 04/12/19 1142 04/13/19 1953 04/13/19 1953 04/14/19 0407 04/15/19 0216  NA 140  --  141  --  143 145  K 3.8   < > 4.1   < > 4.1 4.2  CL 108  --  107  --  112* 115*  CO2 20*  --  18*  --  21* 21*  GLUCOSE 322*  --  364*  --  262* 218*  BUN 26*  --  32*  --  30* 35*  CREATININE 1.60*  --  1.94*  --  1.64* 1.31*  CALCIUM 8.8*  --  8.8*  --  8.4* 8.4*  MG  --   --   --   --  2.0 2.2  PHOS  --   --   --   --  2.3* 1.9*   < > = values in this interval not displayed.   GFR Estimated Creatinine Clearance: 43.7 mL/min (A) (by C-G formula based on SCr of 1.31 mg/dL (H)). Liver Function Tests: Recent Labs  Lab 04/13/19 1953 04/14/19 0407 04/15/19 0216  AST 48* 48* 48*  ALT 21 21 23   ALKPHOS 72 66 71   BILITOT 0.8 0.5 0.7  PROT 7.5 7.0 7.3  ALBUMIN 3.4* 3.0* 3.1*   No results for input(s): LIPASE, AMYLASE in the last 168 hours. No results for input(s): AMMONIA in the last 168 hours. Coagulation profile No results for input(s): INR, PROTIME in the last 168 hours. COVID-19 Labs  Recent Labs    04/13/19 1953 04/14/19 0407 04/15/19 0216  DDIMER 1.86* 1.65* 1.97*  FERRITIN 488* 518* 770*  LDH 477*  --   --   CRP 25.1* 24.5* 24.3*    No results found for: SARSCOV2NAA  CBC: Recent Labs  Lab 04/12/19 1142 04/13/19 1953 04/14/19 0407 04/15/19 0216  WBC 5.1 6.8 7.4 8.6  NEUTROABS 3.6 5.4 6.0 6.9  HGB 13.2 12.9 12.3 12.4  HCT 39.9 38.7 37.7 38.4  MCV 91.3 91.9 92.6 92.8  PLT 166 184 180 227   Cardiac Enzymes: No results for input(s): CKTOTAL, CKMB, CKMBINDEX, TROPONINI in the last 168 hours. BNP (last 3 results) No results for input(s): PROBNP in the last 8760 hours. CBG: Recent Labs  Lab 04/14/19 1715 04/14/19 2216 04/15/19 0150 04/15/19 0519 04/15/19 0740  GLUCAP 281* 219* 191* 259* 263*   D-Dimer: Recent Labs    04/14/19 0407 04/15/19 0216  DDIMER 1.65* 1.97*   Hgb A1c: Recent Labs    04/14/19 0407  HGBA1C 9.8*   Lipid Profile: Recent Labs    04/13/19 1953  TRIG 143   Thyroid function studies: No results for input(s): TSH, T4TOTAL, T3FREE, THYROIDAB in the last 72 hours.  Invalid input(s): FREET3 Anemia work up: Recent Labs    04/14/19 0407 04/15/19 0216  FERRITIN 518* 770*   Sepsis Labs: Recent Labs  Lab 04/12/19 1142 04/13/19 1953 04/13/19 2324 04/14/19 0407 04/15/19 0216  PROCALCITON  --  0.24  --   --   --   WBC 5.1 6.8  --  7.4 8.6  LATICACIDVEN  --  2.8* 1.5  --   --    Microbiology No results found for this or any previous visit (from the past 240 hour(s)).   Medications:   . sodium chloride   Intravenous Once  . atorvastatin  20 mg Oral Daily  . [START ON 04/18/2019] cloNIDine  0.3 mg Transdermal Weekly  .  cloNIDine  0.2 mg Oral BID  . heparin injection (subcutaneous)  5,000 Units Subcutaneous Q8H  . insulin aspart  0-15 Units Subcutaneous TID WC  . insulin aspart  0-5 Units Subcutaneous QHS  . insulin aspart  4 Units Subcutaneous TID WC  . insulin detemir  20 Units Subcutaneous BID  . latanoprost  1 drop Both Eyes QHS  . levothyroxine  50 mcg Oral Q0600  . methylPREDNISolone (SOLU-MEDROL) injection  40 mg Intravenous Q12H  . nebivolol  5 mg Oral Daily  . sodium chloride flush  3 mL Intravenous Q12H  . timolol  1 drop Both Eyes BID   Continuous Infusions: . sodium chloride 75 mL/hr at 04/15/19 0723  . remdesivir 100 mg in NS 100 mL Stopped (04/14/19 1130)     LOS: 2 days   Charlynne Cousins  [facility]  04/15/2019, 7:58 AM

## 2019-04-16 ENCOUNTER — Inpatient Hospital Stay (HOSPITAL_COMMUNITY): Payer: Medicare Other

## 2019-04-16 DIAGNOSIS — I351 Nonrheumatic aortic (valve) insufficiency: Secondary | ICD-10-CM

## 2019-04-16 DIAGNOSIS — I340 Nonrheumatic mitral (valve) insufficiency: Secondary | ICD-10-CM

## 2019-04-16 LAB — CBC WITH DIFFERENTIAL/PLATELET
Abs Immature Granulocytes: 0.15 10*3/uL — ABNORMAL HIGH (ref 0.00–0.07)
Basophils Absolute: 0 10*3/uL (ref 0.0–0.1)
Basophils Relative: 0 %
Eosinophils Absolute: 0 10*3/uL (ref 0.0–0.5)
Eosinophils Relative: 0 %
HCT: 38.6 % (ref 36.0–46.0)
Hemoglobin: 12.3 g/dL (ref 12.0–15.0)
Immature Granulocytes: 1 %
Lymphocytes Relative: 9 %
Lymphs Abs: 1 10*3/uL (ref 0.7–4.0)
MCH: 30.1 pg (ref 26.0–34.0)
MCHC: 31.9 g/dL (ref 30.0–36.0)
MCV: 94.4 fL (ref 80.0–100.0)
Monocytes Absolute: 0.4 10*3/uL (ref 0.1–1.0)
Monocytes Relative: 4 %
Neutro Abs: 9.1 10*3/uL — ABNORMAL HIGH (ref 1.7–7.7)
Neutrophils Relative %: 86 %
Platelets: 240 10*3/uL (ref 150–400)
RBC: 4.09 MIL/uL (ref 3.87–5.11)
RDW: 14.5 % (ref 11.5–15.5)
WBC: 10.7 10*3/uL — ABNORMAL HIGH (ref 4.0–10.5)
nRBC: 0 % (ref 0.0–0.2)

## 2019-04-16 LAB — COMPREHENSIVE METABOLIC PANEL
ALT: 22 U/L (ref 0–44)
AST: 52 U/L — ABNORMAL HIGH (ref 15–41)
Albumin: 3 g/dL — ABNORMAL LOW (ref 3.5–5.0)
Alkaline Phosphatase: 77 U/L (ref 38–126)
Anion gap: 9 (ref 5–15)
BUN: 38 mg/dL — ABNORMAL HIGH (ref 8–23)
CO2: 20 mmol/L — ABNORMAL LOW (ref 22–32)
Calcium: 8.4 mg/dL — ABNORMAL LOW (ref 8.9–10.3)
Chloride: 118 mmol/L — ABNORMAL HIGH (ref 98–111)
Creatinine, Ser: 1.27 mg/dL — ABNORMAL HIGH (ref 0.44–1.00)
GFR calc Af Amer: 48 mL/min — ABNORMAL LOW (ref >60)
GFR calc non Af Amer: 42 mL/min — ABNORMAL LOW (ref >60)
Glucose, Bld: 131 mg/dL — ABNORMAL HIGH (ref 70–99)
Potassium: 4.3 mmol/L (ref 3.5–5.1)
Sodium: 147 mmol/L — ABNORMAL HIGH (ref 135–145)
Total Bilirubin: 0.6 mg/dL (ref 0.3–1.2)
Total Protein: 7 g/dL (ref 6.5–8.1)

## 2019-04-16 LAB — ECHOCARDIOGRAM LIMITED
Height: 65 in
Weight: 3360 oz

## 2019-04-16 LAB — BRAIN NATRIURETIC PEPTIDE: B Natriuretic Peptide: 235.3 pg/mL — ABNORMAL HIGH (ref 0.0–100.0)

## 2019-04-16 LAB — GLUCOSE, CAPILLARY
Glucose-Capillary: 131 mg/dL — ABNORMAL HIGH (ref 70–99)
Glucose-Capillary: 150 mg/dL — ABNORMAL HIGH (ref 70–99)
Glucose-Capillary: 179 mg/dL — ABNORMAL HIGH (ref 70–99)
Glucose-Capillary: 146 mg/dL — ABNORMAL HIGH (ref 70–99)
Glucose-Capillary: 146 mg/dL — ABNORMAL HIGH (ref 70–99)

## 2019-04-16 LAB — TROPONIN I (HIGH SENSITIVITY)
Troponin I (High Sensitivity): 5 ng/L (ref <18)
Troponin I (High Sensitivity): 5 ng/L (ref <18)

## 2019-04-16 LAB — D-DIMER, QUANTITATIVE: D-Dimer, Quant: 1.65 ug/mL-FEU — ABNORMAL HIGH (ref 0.00–0.50)

## 2019-04-16 LAB — FERRITIN: Ferritin: 815 ng/mL — ABNORMAL HIGH (ref 11–307)

## 2019-04-16 LAB — PROCALCITONIN: Procalcitonin: <0.1 ng/mL

## 2019-04-16 LAB — PHOSPHORUS: Phosphorus: 2.2 mg/dL — ABNORMAL LOW (ref 2.5–4.6)

## 2019-04-16 LAB — MAGNESIUM: Magnesium: 2.4 mg/dL (ref 1.7–2.4)

## 2019-04-16 LAB — C-REACTIVE PROTEIN: CRP: 14.5 mg/dL — ABNORMAL HIGH (ref <1.0)

## 2019-04-16 MED ORDER — FUROSEMIDE 10 MG/ML IJ SOLN
40.0000 mg | Freq: Once | INTRAMUSCULAR | Status: AC
  Administered 2019-04-16: 40 mg via INTRAVENOUS
  Filled 2019-04-16: qty 4

## 2019-04-16 MED ORDER — ALBUTEROL SULFATE HFA 108 (90 BASE) MCG/ACT IN AERS
1.0000 | INHALATION_SPRAY | RESPIRATORY_TRACT | Status: DC | PRN
  Administered 2019-04-18 – 2019-04-19 (×2): 2 via RESPIRATORY_TRACT
  Filled 2019-04-16: qty 6.7

## 2019-04-16 NOTE — Progress Notes (Signed)
Spoke with Dr. Jani Gravel with pt c/o CP 6/10 left side.Vital signs stable, see flowsheet, and blood sugar 131.  New orders noted and initiated: Stat EKG, troponin, and ECHO this AM.

## 2019-04-16 NOTE — Progress Notes (Signed)
*   Echocardiogram 2D Echocardiogram has been performed.  Darlina Sicilian M 04/16/2019, 8:57 AM

## 2019-04-16 NOTE — Progress Notes (Signed)
[facility] PROGRESS NOTE    Progress Note  Jade Sullivan  [id number] DOB: 10/09/1945 DOA: 04/13/2019 PCP: Hoyt Koch, MD     Brief Narrative:   Jade Sullivan is an 74 y.o. female past medical history of COVID-19 infection on April 05, 2019, chronic disease stage III, hypothyroidism hypertension, came back to the emergency room when she was not able to tolerate her oral, was found to be satting greater than 95% hydrated and discharged back home came back on the day of admission to be hypoxic requiring 4 L of oxygen to keep saturations greater than 93%, tidal infiltrates.  Assessment/Plan:   Acte respiratory failure with hypoxia secondarily to Pneumonia due to COVID-19 virus Yesterday in the afternoon she was on 5 L has been brought down to 4 L of oxygen through nasal cannula she is currently satting greater than 95%. Continue IV remdesivir and steroids, she will definitely have to be on steroids around 10 days, she received convalescent plasma and IV Actemra on 04/13/2018. Her inflammatory markers are significantly improved, especially her D-dimer I will discontinue the CT angio of the chest. Overnight she started complaining of chest pain, on my examination she is tender throughout her left side of the chest back in front likely musculoskeletal cardiac biomarkers are negative twelve-lead EKG showed no signs of ischemia.  Small leukocytosis likely due to steroids.  BNP is mildly elevated will give a single dose of Lasix.  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  New mild hypernatremia: Likely due to hypervolemia, she is about 5 L positive. KVO IV fluids, BNP is elevated chest x-ray showed bilateral infiltrates which are worsening we will give IV  Lasix.  Hyperlipidemia: Continue current home regimen.  Obesity, unspecified: Counseling.  New onset diabetes mellitus: With an A1c of 9.8, currently on steroids her blood glucose has been erratic, has been fairly controlled with long-acting insulin and sliding scale insulin resistant scale, will continue current regimen.  DVT prophylaxis: LOVENOX Family Communication:none Disposition Plan/Barrier to D/C: unable to determine Code Status:     Code Status Orders  (From admission, onward)         Start     Ordered   04/13/19 2310  Full code  Continuous     04/13/19 2309        Code Status History    This patient has a current code status but no historical code status.   Advance Care Planning Activity        IV Access:    Peripheral IV   Procedures and diagnostic studies:   No results found.   Medical Consultants:    None.  Anti-Infectives:   None  Subjective:    [name] relates he does not feel well today. Objective:    Vitals:   04/15/19 1020 04/15/19 1549 04/15/19 2145 04/16/19 0611  BP: 127/66 135/74 (!) 139/99 (!) 144/84  Pulse:  63 64 67  Resp:  16 20 20   Temp:  99.3 F (37.4 C) 98.4 F (36.9 C) 99.1 F (37.3 C)  TempSrc:  Oral Oral Oral  SpO2:   94% 95%  Weight:      Height:       SpO2: 95 % O2 Flow Rate (L/min): 4 L/min   Intake/Output Summary (Last 24 hours)  at 04/16/2019 0814 Last data filed at 04/16/2019 0600 Gross per 24 hour  Intake 2471.85 ml  Output 0 ml  Net 2471.85 ml   Filed Weights   04/13/19 1938  Weight: 95.3 kg    Exam: General exam: In no acute distress. Respiratory system: Good air movement and clear to auscultation. Cardiovascular system: S1 & S2 heard, RRR. No JVD. Gastrointestinal system: Abdomen is nondistended, soft and nontender.  Central nervous system: Alert and oriented. No focal neurological deficits. Extremities: No pedal edema. Skin: No rashes, lesions or ulcers Psychiatry:  Judgement and insight appear normal. Mood & affect appropriate. Data Reviewed:    Labs: Basic Metabolic Panel: Recent Labs  Lab 04/12/19 1142 04/12/19 1142 04/13/19 1953 04/13/19 1953 04/14/19 0407 04/14/19 0407 04/15/19 0216 04/16/19 0145  NA 140  --  141  --  143  --  145 147*  K 3.8   < > 4.1   < > 4.1   < > 4.2 4.3  CL 108  --  107  --  112*  --  115* 118*  CO2 20*  --  18*  --  21*  --  21* 20*  GLUCOSE 322*  --  364*  --  262*  --  218* 131*  BUN 26*  --  32*  --  30*  --  35* 38*  CREATININE 1.60*  --  1.94*  --  1.64*  --  1.31* 1.27*  CALCIUM 8.8*  --  8.8*  --  8.4*  --  8.4* 8.4*  MG  --   --   --   --  2.0  --  2.2 2.4  PHOS  --   --   --   --  2.3*  --  1.9* 2.2*   < > = values in this interval not displayed.   GFR Estimated Creatinine Clearance: 45 mL/min (A) (by C-G formula based on SCr of 1.27 mg/dL (H)). Liver Function Tests: Recent Labs  Lab 04/13/19 1953 04/14/19 0407 04/15/19 0216 04/16/19 0145  AST 48* 48* 48* 52*  ALT 21 21 23 22   ALKPHOS 72 66 71 77  BILITOT 0.8 0.5 0.7 0.6  PROT 7.5 7.0 7.3 7.0  ALBUMIN 3.4* 3.0* 3.1* 3.0*   No results for input(s): LIPASE, AMYLASE in the last 168 hours. No results for input(s): AMMONIA in the last 168 hours. Coagulation profile No results for input(s): INR, PROTIME in the last 168 hours. COVID-19 Labs  Recent Labs    04/13/19 1953 04/13/19 1953 04/14/19 0407 04/15/19 0216 04/16/19 0145  DDIMER 1.86*   < > 1.65* 1.97* 1.65*  FERRITIN 488*   < > 518* 770* 815*  LDH 477*  --   --   --   --   CRP 25.1*   < > 24.5* 24.3* 14.5*   < > = values in this interval not displayed.    No results found for: SARSCOV2NAA  CBC: Recent Labs  Lab 04/12/19 1142 04/13/19 1953 04/14/19 0407 04/15/19 0216 04/16/19 0145  WBC 5.1 6.8 7.4 8.6 10.7*  NEUTROABS 3.6 5.4 6.0 6.9 9.1*  HGB 13.2 12.9 12.3 12.4 12.3  HCT 39.9 38.7 37.7 38.4 38.6  MCV 91.3 91.9 92.6 92.8 94.4  PLT 166 184 180 227 240   Cardiac  Enzymes: No results for input(s): CKTOTAL, CKMB, CKMBINDEX, TROPONINI in the last 168 hours. BNP (last 3 results) No results for input(s): PROBNP in the last 8760 hours. CBG: Recent Labs  Lab 04/15/19  1126 04/15/19 1709 04/15/19 2146 04/15/19 2222 04/16/19 0627  GLUCAP 267* 206* 67* 117* 131*   D-Dimer: Recent Labs    04/15/19 0216 04/16/19 0145  DDIMER 1.97* 1.65*   Hgb A1c: Recent Labs    04/14/19 0407  HGBA1C 9.8*   Lipid Profile: Recent Labs    04/13/19 1953  TRIG 143   Thyroid function studies: No results for input(s): TSH, T4TOTAL, T3FREE, THYROIDAB in the last 72 hours.  Invalid input(s): FREET3 Anemia work up: Recent Labs    04/15/19 0216 04/16/19 0145  FERRITIN 770* 815*   Sepsis Labs: Recent Labs  Lab 04/13/19 1953 04/13/19 2324 04/14/19 0407 04/15/19 0216 04/16/19 0145  PROCALCITON 0.24  --   --   --   --   WBC 6.8  --  7.4 8.6 10.7*  LATICACIDVEN 2.8* 1.5  --   --   --    Microbiology Recent Results (from the past 240 hour(s))  Blood Culture (routine x 2)     Status: None (Preliminary result)   Collection Time: 04/13/19  7:50 PM   Specimen: BLOOD  Result Value Ref Range Status   Specimen Description   Final    BLOOD LEFT ANTECUBITAL Performed at [facility], [place] [address] [place], [place] [address]    Special Requests   Final    BOTTLES DRAWN AEROBIC ONLY Blood Culture results may not be optimal due to an excessive volume of blood received in culture bottles Performed at [place] [address], [place], [place] [address]    Culture   Final    NO GROWTH 1 DAY Performed at [facility], [place] [address], [place], [place] [address]    Report Status PENDING  Incomplete  Blood Culture (routine x 2)     Status: None (Preliminary result)   Collection Time: 04/13/19  7:53 PM   Specimen: BLOOD  Result Value Ref Range Status   Specimen Description   Final    BLOOD BLOOD LEFT  FOREARM Performed at [facility] [address], [place], [place] [address]    Special Requests   Final    BOTTLES DRAWN AEROBIC AND ANAEROBIC Blood Culture adequate volume Performed at [facility] [address], [place] , [place] [address]    Culture   Final    NO GROWTH 1 DAY Performed at [facility], [place] [address], [place], [place] [address]    Report Status PENDING  Incomplete     Medications:   . atorvastatin  20 mg Oral Daily  . [START ON 04/18/2019] cloNIDine  0.3 mg Transdermal Weekly  . cloNIDine  0.2 mg Oral BID  . heparin injection (subcutaneous)  5,000 Units Subcutaneous Q8H  . insulin aspart  0-15 Units Subcutaneous TID WC  . insulin aspart  0-5 Units Subcutaneous QHS  . insulin aspart  6 Units Subcutaneous TID WC  . insulin detemir  30 Units Subcutaneous BID  . latanoprost  1 drop Both Eyes QHS  . levothyroxine  50 mcg Oral Q0600  . methylPREDNISolone (SOLU-MEDROL) injection  40 mg Intravenous Q12H  . nebivolol  5 mg Oral Daily  . sodium chloride flush  3 mL Intravenous Q12H  . timolol  1 drop Both Eyes BID   Continuous Infusions: . sodium chloride 75 mL/hr at 04/16/19 0400  . remdesivir 100 mg in NS 100 mL 100 mg (04/15/19 1028)     LOS: 3 days   Tammi Klippel  Aileen Fass  [facility]  04/16/2019, 8:14 AM

## 2019-04-16 NOTE — Plan of Care (Signed)
  Problem: Education: Goal: Knowledge of risk factors and measures for prevention of condition will improve Outcome: Progressing   Problem: Coping: Goal: Psychosocial and spiritual needs will be supported Outcome: Progressing   Problem: Respiratory: Goal: Will maintain a patent airway Outcome: Progressing Goal: Complications related to the disease process, condition or treatment will be avoided or minimized Outcome: Progressing   Problem: Education: Goal: Knowledge of General Education information will improve Description: Including pain rating scale, medication(s)/side effects and non-pharmacologic comfort measures Outcome: Progressing   Problem: Clinical Measurements: Goal: Respiratory complications will improve Outcome: Progressing Goal: Cardiovascular complication will be avoided Outcome: Progressing   Problem: Activity: Goal: Risk for activity intolerance will decrease Outcome: Progressing   Problem: Elimination: Goal: Will not experience complications related to bowel motility Outcome: Progressing Goal: Will not experience complications related to urinary retention Outcome: Progressing   Problem: Pain Managment: Goal: General experience of comfort will improve Outcome: Progressing   Problem: Safety: Goal: Ability to remain free from injury will improve Outcome: Progressing

## 2019-04-16 NOTE — Plan of Care (Signed)
Xcover Left sided chest pain  Dull  A/P Chest pain  12 lead ekg Trop I q2h x2 Since D dimer positive, Check CTA chest r/o PE Check cardiac echo

## 2019-04-17 LAB — COMPREHENSIVE METABOLIC PANEL
ALT: 24 U/L (ref 0–44)
AST: 44 U/L — ABNORMAL HIGH (ref 15–41)
Albumin: 2.8 g/dL — ABNORMAL LOW (ref 3.5–5.0)
Alkaline Phosphatase: 71 U/L (ref 38–126)
Anion gap: 11 (ref 5–15)
BUN: 33 mg/dL — ABNORMAL HIGH (ref 8–23)
CO2: 19 mmol/L — ABNORMAL LOW (ref 22–32)
Calcium: 8.3 mg/dL — ABNORMAL LOW (ref 8.9–10.3)
Chloride: 118 mmol/L — ABNORMAL HIGH (ref 98–111)
Creatinine, Ser: 1.13 mg/dL — ABNORMAL HIGH (ref 0.44–1.00)
GFR calc Af Amer: 56 mL/min — ABNORMAL LOW (ref >60)
GFR calc non Af Amer: 48 mL/min — ABNORMAL LOW (ref >60)
Glucose, Bld: 115 mg/dL — ABNORMAL HIGH (ref 70–99)
Potassium: 3.9 mmol/L (ref 3.5–5.1)
Sodium: 148 mmol/L — ABNORMAL HIGH (ref 135–145)
Total Bilirubin: 0.8 mg/dL (ref 0.3–1.2)
Total Protein: 6.7 g/dL (ref 6.5–8.1)

## 2019-04-17 LAB — CBC WITH DIFFERENTIAL/PLATELET
Abs Immature Granulocytes: 0.26 10*3/uL — ABNORMAL HIGH (ref 0.00–0.07)
Basophils Absolute: 0 10*3/uL (ref 0.0–0.1)
Basophils Relative: 0 %
Eosinophils Absolute: 0 10*3/uL (ref 0.0–0.5)
Eosinophils Relative: 0 %
HCT: 39.4 % (ref 36.0–46.0)
Hemoglobin: 12.9 g/dL (ref 12.0–15.0)
Immature Granulocytes: 2 %
Lymphocytes Relative: 9 %
Lymphs Abs: 1 10*3/uL (ref 0.7–4.0)
MCH: 30.6 pg (ref 26.0–34.0)
MCHC: 32.7 g/dL (ref 30.0–36.0)
MCV: 93.4 fL (ref 80.0–100.0)
Monocytes Absolute: 0.5 10*3/uL (ref 0.1–1.0)
Monocytes Relative: 5 %
Neutro Abs: 9.2 10*3/uL — ABNORMAL HIGH (ref 1.7–7.7)
Neutrophils Relative %: 84 %
Platelets: 241 10*3/uL (ref 150–400)
RBC: 4.22 MIL/uL (ref 3.87–5.11)
RDW: 14.4 % (ref 11.5–15.5)
WBC: 11 10*3/uL — ABNORMAL HIGH (ref 4.0–10.5)
nRBC: 0.2 % (ref 0.0–0.2)

## 2019-04-17 LAB — GLUCOSE, CAPILLARY
Glucose-Capillary: 129 mg/dL — ABNORMAL HIGH (ref 70–99)
Glucose-Capillary: 141 mg/dL — ABNORMAL HIGH (ref 70–99)
Glucose-Capillary: 222 mg/dL — ABNORMAL HIGH (ref 70–99)
Glucose-Capillary: 252 mg/dL — ABNORMAL HIGH (ref 70–99)
Glucose-Capillary: 79 mg/dL (ref 70–99)
Glucose-Capillary: 86 mg/dL (ref 70–99)

## 2019-04-17 LAB — PHOSPHORUS: Phosphorus: 2.8 mg/dL (ref 2.5–4.6)

## 2019-04-17 LAB — FERRITIN: Ferritin: 634 ng/mL — ABNORMAL HIGH (ref 11–307)

## 2019-04-17 LAB — PROCALCITONIN: Procalcitonin: <0.1 ng/mL

## 2019-04-17 LAB — MAGNESIUM: Magnesium: 2.2 mg/dL (ref 1.7–2.4)

## 2019-04-17 LAB — D-DIMER, QUANTITATIVE: D-Dimer, Quant: 1.96 ug/mL-FEU — ABNORMAL HIGH (ref 0.00–0.50)

## 2019-04-17 LAB — C-REACTIVE PROTEIN: CRP: 9.6 mg/dL — ABNORMAL HIGH (ref <1.0)

## 2019-04-17 MED ORDER — FUROSEMIDE 10 MG/ML IJ SOLN
40.0000 mg | Freq: Once | INTRAMUSCULAR | Status: AC
  Administered 2019-04-17: 40 mg via INTRAVENOUS
  Filled 2019-04-17: qty 4

## 2019-04-17 MED ORDER — ENOXAPARIN SODIUM 40 MG/0.4ML ~~LOC~~ SOLN
40.0000 mg | SUBCUTANEOUS | Status: DC
  Administered 2019-04-17 – 2019-04-21 (×5): 40 mg via SUBCUTANEOUS
  Filled 2019-04-17 (×5): qty 0.4

## 2019-04-17 MED ORDER — SPIRONOLACTONE 25 MG PO TABS
100.0000 mg | ORAL_TABLET | Freq: Every day | ORAL | Status: DC
  Administered 2019-04-17 – 2019-04-22 (×6): 100 mg via ORAL
  Filled 2019-04-17 (×2): qty 4
  Filled 2019-04-17: qty 1
  Filled 2019-04-17 (×3): qty 4

## 2019-04-17 MED ORDER — LOSARTAN POTASSIUM 25 MG PO TABS
100.0000 mg | ORAL_TABLET | Freq: Every day | ORAL | Status: DC
  Administered 2019-04-17 – 2019-04-20 (×4): 100 mg via ORAL
  Filled 2019-04-17 (×4): qty 2
  Filled 2019-04-17: qty 4

## 2019-04-17 MED ORDER — INSULIN DETEMIR 100 UNIT/ML ~~LOC~~ SOLN
20.0000 [IU] | Freq: Two times a day (BID) | SUBCUTANEOUS | Status: DC
  Administered 2019-04-17 (×2): 20 [IU] via SUBCUTANEOUS
  Filled 2019-04-17 (×3): qty 0.2

## 2019-04-17 MED ORDER — POTASSIUM CHLORIDE CRYS ER 20 MEQ PO TBCR
40.0000 meq | EXTENDED_RELEASE_TABLET | Freq: Three times a day (TID) | ORAL | Status: AC
  Administered 2019-04-17 (×3): 40 meq via ORAL
  Filled 2019-04-17: qty 4
  Filled 2019-04-17 (×2): qty 2

## 2019-04-17 MED ORDER — TIMOLOL HEMIHYDRATE 0.25 % OP SOLN
1.0000 [drp] | Freq: Two times a day (BID) | OPHTHALMIC | Status: DC
  Administered 2019-04-17: 1 [drp] via OPHTHALMIC

## 2019-04-17 NOTE — Plan of Care (Signed)

## 2019-04-17 NOTE — Progress Notes (Signed)
[facility] PROGRESS NOTE    Progress Note  MEGGEN ANKENBAUER  [id number] DOB: 02/18/1946 DOA: 04/13/2019 PCP: Hoyt Koch, MD     Brief Narrative:   Jade Sullivan is an 74 y.o. female past medical history of COVID-19 infection on April 05, 2019, chronic disease stage III, hypothyroidism hypertension, came back to the emergency room when she was not able to tolerate her oral, was found to be satting greater than 95% hydrated and discharged back home came back on the day of admission to be hypoxic requiring 4 L of oxygen to keep saturations greater than 93%, tidal infiltrates.  Assessment/Plan:   Acte respiratory failure with hypoxia secondarily to Pneumonia due to COVID-19 virus Is all 1 is now requiring 5 L of oxygen to keep saturations greater 95%. Continue IV remdesivir and steroids, will have to continue treatment for at least 10 days. She did receive convalescent plasma and IV Actemra on 04/13/2018. 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  New mild hypernatremia: Likely due to hypervolemia we started IV diuresis, she still positive blood 4 L we will continue IV fluids, mild left-sided pleural effusion and her BNP was elevated. 2D echo showed an ejection fraction that is preserved but grade 2 diastolic heart failure.  Chronic diastolic heart failure: Resume her ACE inhibitor and beta-blocker, she was started on low-dose diuretic. Resume her Aldactone.  Hyperlipidemia: Continue current home regimen.  Obesity, unspecified: Counseling.  New onset diabetes mellitus: With an A1c of 9.8, she is on steroids which will make her blood glucose erratic, continue long-acting insulin  plus sliding scale her blood glucose has been fairly controlled.  DVT prophylaxis: Lovenox Family Communication:none Disposition Plan/Barrier to D/C: unable to determine Code Status:     Code Status Orders  (From admission, onward)         Start     Ordered   04/13/19 2310  Full code  Continuous     04/13/19 2309        Code Status History    This patient has a current code status but no historical code status.   Advance Care Planning Activity        IV Access:    Peripheral IV   Procedures and diagnostic studies:   DG CHEST PORT 1 VIEW  Result Date: 04/17/2019 CLINICAL DATA:  Shortness of breath. COVID positive. EXAM: PORTABLE CHEST 1 VIEW COMPARISON:  Radiograph earlier this day at 52 hour FINDINGS: Cardiomegaly is unchanged. Diffuse bilateral heterogeneous pulmonary opacities, confluent in the left lower lobe, not significantly changed. Possible left pleural effusion. No pneumothorax. IMPRESSION: No significant change in diffuse bilateral heterogeneous pulmonary opacities, confluent in the left lower lobe. Possible left pleural effusion. Stable cardiomegaly. Electronically Signed   By: Keith Rake M.D.   On: 04/17/2019 00:19   DG CHEST PORT 1 VIEW  Result Date: 04/16/2019 CLINICAL DATA:  COVID-19 infection. EXAM: PORTABLE CHEST 1 VIEW COMPARISON:  Chest x-ray 04/13/2019. FINDINGS: Stable cardiomegaly. Progressive severe diffuse bilateral pulmonary infiltrates/edema. Low lung volumes. Left pleural effusion cannot be excluded. No pneumothorax. IMPRESSION: 1.  Stable cardiomegaly. 2. Progressive severe diffuse bilateral pulmonary infiltrates/edema. Low lung volumes. Left pleural effusion cannot be excluded. Electronically Signed   By: [name]  Register  On: 04/16/2019 09:14   ECHOCARDIOGRAM LIMITED  Result Date: 04/16/2019   ECHOCARDIOGRAM LIMITED REPORT   Patient Name:   Jade Sullivan Date of Exam: 04/16/2019 Medical Rec #:  [id number]       Height:       65.0 in  Accession #:    [id number]      Weight:       210.0 lb Date of Birth:  [birth year]       BSA:          2.02 m Patient Age:    65 years        BP:           144/84 mmHg Patient Gender: F               HR:           67 bpm. Exam Location:  Inpatient  Procedure: Limited Echo, Limited Color Doppler and Cardiac Doppler Indications:    Chest Pain 786.50 / R07.9Chest Pain 786.50 / R07.9  History:        Patient has no prior history of Echocardiogram examinations.                 Covid 19. Chronic kidney disease. thyroid disease.  Sonographer:    Darlina Sicilian RDCS Referring Phys: [place]  1. Left ventricular ejection fraction, by visual estimation, is 55 to 60%. The left ventricle has normal function. There is severely increased left ventricular wall thickness.  2. Left ventricular diastolic parameters are consistent with Grade II diastolic dysfunction (pseudonormalization).  3. Elevated left atrial pressure.  4. Global right ventricle has normal systolc function.The right ventricular size is normal.  5. Left atrial size was severely dilated.  6. Moderate pericardial effusion, measuring up to 1.7cm adjacent to LV lateral wall.  7. The mitral valve is normal in structure. Mild mitral valve regurgitation.  8. The tricuspid valve was normal in structure. Tricuspid valve regurgitation is trivial.  9. The aortic valve is tricuspid. Aortic valve regurgitation is mild to moderate. The aortic valve is has no stenosis. 10. 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 11. Severely dilated pulmonary artery, measures 13mm 12. The inferior vena cava is normal in size with greater than 50% respiratory variability, suggesting right atrial pressure of 3 mmHg. 13. The tricuspid regurgitant velocity is 2.74 m/s, and with an assumed right atrial pressure of 3 mmHg, the estimated right ventricular systolic pressure is mildly elevated at 33.0 mmHg. 14. Normal LV systolic function, Grade 2 diastolic  dysfunction, normal RV function, mild-moderate AI, ascending aorta dilatation (63mm), severe main pulmonary artery dilatation (14mm), RVSP 33, moderate pericardial effusion without evidence of tamponade FINDINGS  Left Ventricle: Left ventricular ejection fraction, by visual estimation, is 55 to 60%. The left ventricle has normal function. The left ventricle has no regional wall motion abnormalities. There is severely increased left ventricular wall thickness. Left ventricular diastolic parameters are consistent with Grade II diastolic dysfunction (pseudonormalization). Elevated left atrial pressure. Right Ventricle: The right ventricular size is normal. Global RV systolic function is has normal systolic function. The tricuspid regurgitant velocity is 2.74 m/s, and with an assumed right atrial pressure of 3 mmHg, the estimated right ventricular systolic pressure is mildly elevated at 33.0 mmHg. Left Atrium: Left atrial size was severely dilated. Pericardium: A moderately sized pericardial effusion is present is seen. A moderately sized pericardial effusion is present. Mitral Valve: The mitral valve is normal in structure.  MV Area by PHT, 3.46 cm. MV PHT, 63.51 msec. Mild mitral valve regurgitation. Tricuspid Valve: The tricuspid valve is normal in structure. Tricuspid valve regurgitation is trivial. Aortic Valve: The aortic valve is tricuspid. Aortic valve regurgitation is mild to moderate. Aortic regurgitation PHT measures 678 msec. The aortic valve is structurally normal, with no evidence of sclerosis or stenosis. Pulmonic Valve: The pulmonic valve was not well visualized. Pulmonic valve regurgitation is mild by color flow Doppler. Pulmonic regurgitation is mild by color flow Doppler. Aorta: Aortic dilatation noted. 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 Pulmonary Artery: The pulmonary artery is severely dilated. Venous: The inferior vena cava is normal in size with greater than 50%  respiratory variability, suggesting right atrial pressure of 3 mmHg.  LEFT VENTRICLE          Normals PLAX 2D LVIDd:         4.30 cm  3.6 cm   Diastology                 Normals LVIDs:         3.00 cm  1.7 cm   LV e' lateral:   5.33 cm/s 6.42 cm/s LV PW:         1.60 cm  1.4 cm   LV E/e' lateral: 19.9      15.4 LV IVS:        1.90 cm  1.3 cm   LV e' medial:    4.35 cm/s 6.96 cm/s LVOT diam:     2.10 cm  2.0 cm   LV E/e' medial:  24.4      6.96 LV SV:         48 ml    79 ml LV SV Index:   22.57    45 ml/m2 LVOT Area:     3.46 cm 3.14 cm2  LEFT ATRIUM              Index LA diam:        3.70 cm  1.83 cm/m LA Vol (A2C):   125.0 ml 61.89 ml/m LA Vol (A4C):   94.1 ml  46.59 ml/m LA Biplane Vol: 114.0 ml 56.45 ml/m  AORTIC VALVE          Normals AI PHT:      678 msec  AORTA                 Normals Ao Root diam: 3.50 cm 31 mm Ao Asc diam:  4.10 cm 31 mm MITRAL VALVE              Normals    TRICUSPID VALVE             Normals MV Area (PHT): 3.46 cm              TR Peak grad:   30.0 mmHg MV PHT:        63.51 msec 55 ms      TR Vmax:        274.00 cm/s 288 cm/s MV Decel Time: 219 msec   187 ms MV E velocity: 106.00 cm/s 103 cm/s  SHUNTS MV A velocity: 87.50 cm/s  70.3 cm/s Systemic Diam: 2.10 cm MV E/A ratio:  1.21        1.5  Oswaldo Milian MD Electronically signed by Oswaldo Milian MD Signature Date/Time: 04/16/2019/2:01:31 PMThe mitral valve is normal in structure.    Final      Medical Consultants:  None.  Anti-Infectives:   None  Subjective:    SHONDREA TRIM has no new complaints today except for her diet. Objective:    Vitals:   04/16/19 2158 04/16/19 2250 04/16/19 2300 04/17/19 0420  BP: (!) 159/81   (!) 160/95  Pulse: (!) 57   (!) 55  Resp:    (!) 23  Temp:    97.9 F (36.6 C)  TempSrc:    Oral  SpO2:  (!) 89% 91% 95%  Weight:      Height:       SpO2: 95 % O2 Flow Rate (L/min): 5 L/min   Intake/Output Summary (Last 24 hours) at 04/17/2019 0822 Last data filed at  04/17/2019 0548 [place] per 24 hour  Intake 1351.76 ml  Output 1800 ml  Net -448.24 ml   Filed Weights   04/13/19 1938  Weight: 95.3 kg    Exam: General exam: In no acute distress. Respiratory system: Good air movement and diffuse crackles Cardiovascular system: S1 & S2 heard, RRR. No JVD. Gastrointestinal system: Abdomen is nondistended, soft and nontender.  Central nervous system: Alert and oriented. No focal neurological deficits. Extremities: No pedal edema. Skin: No rashes, lesions or ulcers Psychiatry: Judgement and insight appear normal. Mood & affect appropriate. Data Reviewed:    Labs: Basic Metabolic Panel: Recent Labs  Lab 04/13/19 1953 04/13/19 1953 04/14/19 0407 04/14/19 0407 04/15/19 0216 04/15/19 0216 04/16/19 0145 04/17/19 0207  NA 141  --  143  --  145  --  147* 148*  K 4.1   < > 4.1   < > 4.2   < > 4.3 3.9  CL 107  --  112*  --  115*  --  118* 118*  CO2 18*  --  21*  --  21*  --  20* 19*  GLUCOSE 364*  --  262*  --  218*  --  131* 115*  BUN 32*  --  30*  --  35*  --  38* 33*  CREATININE 1.94*  --  1.64*  --  1.31*  --  1.27* 1.13*  CALCIUM 8.8*  --  8.4*  --  8.4*  --  8.4* 8.3*  MG  --   --  2.0  --  2.2  --  2.4 2.2  PHOS  --   --  2.3*  --  1.9*  --  2.2* 2.8   < > = values in this interval not displayed.   GFR Estimated Creatinine Clearance: 50.6 mL/min (A) (by C-G formula based on SCr of 1.13 mg/dL (H)). Liver Function Tests: Recent Labs  Lab 04/13/19 1953 04/14/19 0407 04/15/19 0216 04/16/19 0145 04/17/19 0207  AST 48* 48* 48* 52* 44*  ALT 21 21 23 22 24   ALKPHOS 72 66 71 77 71  BILITOT 0.8 0.5 0.7 0.6 0.8  PROT 7.5 7.0 7.3 7.0 6.7  ALBUMIN 3.4* 3.0* 3.1* 3.0* 2.8*   No results for input(s): LIPASE, AMYLASE in the last 168 hours. No results for input(s): AMMONIA in the last 168 hours. Coagulation profile No results for input(s): INR, PROTIME in the last 168 hours. COVID-19 Labs  Recent Labs    04/15/19 0216 04/16/19 0145  04/17/19 0207  DDIMER 1.97* 1.65* 1.96*  FERRITIN 770* 815* 634*  CRP 24.3* 14.5* 9.6*    No results found for: SARSCOV2NAA  CBC: Recent Labs  Lab 04/13/19 1953 04/14/19 0407 04/15/19 0216 04/16/19 0145 04/17/19 0207  WBC 6.8 7.4 8.6 10.7* 11.0*  NEUTROABS 5.4 6.0 6.9 9.1* 9.2*  HGB 12.9 12.3 12.4 12.3 12.9  HCT 38.7 37.7 38.4 38.6 39.4  MCV 91.9 92.6 92.8 94.4 93.4  PLT 184 180 227 240 241   Cardiac Enzymes: No results for input(s): CKTOTAL, CKMB, CKMBINDEX, TROPONINI in the last 168 hours. BNP (last 3 results) No results for input(s): PROBNP in the last 8760 hours. CBG: Recent Labs  Lab 04/16/19 1719 04/16/19 2044 04/17/19 0006 04/17/19 0412 04/17/19 0804  GLUCAP 146* 146* 129* 86 79   D-Dimer: Recent Labs    04/16/19 0145 04/17/19 0207  DDIMER 1.65* 1.96*   Hgb A1c: No results for input(s): HGBA1C in the last 72 hours. Lipid Profile: No results for input(s): CHOL, HDL, LDLCALC, TRIG, CHOLHDL, LDLDIRECT in the last 72 hours. Thyroid function studies: No results for input(s): TSH, T4TOTAL, T3FREE, THYROIDAB in the last 72 hours.  Invalid input(s): FREET3 Anemia work up: Recent Labs    04/16/19 0145 04/17/19 0207  FERRITIN 815* 634*   Sepsis Labs: Recent Labs  Lab 04/13/19 1953 04/13/19 1953 04/13/19 2324 04/14/19 0407 04/15/19 0216 04/16/19 0145 04/16/19 0845 04/17/19 0207  PROCALCITON 0.24  --   --   --   --   --  <0.10 <0.10  WBC 6.8   < >  --  7.4 8.6 10.7*  --  11.0*  LATICACIDVEN 2.8*  --  1.5  --   --   --   --   --    < > = values in this interval not displayed.   Microbiology Recent Results (from the past 240 hour(s))  Blood Culture (routine x 2)     Status: None (Preliminary result)   Collection Time: 04/13/19  7:50 PM   Specimen: BLOOD  Result Value Ref Range Status   Specimen Description   Final    BLOOD LEFT ANTECUBITAL Performed at [facility] [address], [place], [place] [address]    Special  Requests   Final    BOTTLES DRAWN AEROBIC ONLY Blood Culture results may not be optimal due to an excessive volume of blood received in culture bottles Performed at [facility] [address], [place], [place] [address]    Culture   Final    NO GROWTH 3 DAYS Performed at [facility], [place] [address] [place], [place] [address]    Report Status PENDING  Incomplete  Blood Culture (routine x 2)     Status: None (Preliminary result)   Collection Time: 04/13/19  7:53 PM   Specimen: BLOOD  Result Value Ref Range Status   Specimen Description   Final    BLOOD BLOOD LEFT FOREARM Performed at [facility] [address], [place], [place] [address]    Special Requests   Final    BOTTLES DRAWN AEROBIC AND ANAEROBIC Blood Culture adequate volume Performed at [facility] [address], [place], [place] [address]    Culture   Final    NO GROWTH 3 DAYS Performed at [facility], [place] [address] [place], [place] [address]    Report Status PENDING  Incomplete     Medications:    atorvastatin  20 mg Oral Daily   [START ON 04/18/2019] cloNIDine  0.3 mg Transdermal Weekly   cloNIDine  0.2 mg Oral BID   heparin injection (subcutaneous)  5,000 Units Subcutaneous Q8H   insulin aspart  0-15 Units Subcutaneous TID WC   insulin aspart  0-5  Units Subcutaneous QHS   insulin aspart  6 Units Subcutaneous TID WC   insulin detemir  30 Units Subcutaneous BID   latanoprost  1 drop Both Eyes QHS   levothyroxine  50 mcg Oral Q0600   methylPREDNISolone (SOLU-MEDROL) injection  40 mg Intravenous Q12H   nebivolol  5 mg Oral Daily   sodium chloride flush  3 mL Intravenous Q12H   timolol  1 drop Both Eyes BID   Continuous Infusions:  remdesivir 100 mg in NS 100 mL 100 mg (04/16/19 0925)     LOS: 4 days   Charlynne Cousins  [facility]  04/17/2019, 8:22 AM

## 2019-04-17 NOTE — Evaluation (Signed)
Physical Therapy Evaluation Patient Details Name: Jade Sullivan MRN: [id number] DOB: 1945-08-15 Today's Date: 04/17/2019   History of Present Illness  74 yo female admitted with COVID Pna, new onset DM. Hx of CKD, gout, obesity  Clinical Impression  On eval, pt required Min assist for mobility. Activity was limited due to no O2 carriers available on unit and pt requires O2 for activity currently. She was able to take a few ambulatory steps and perform some standing exercises before becoming fatigued. O2 89% on 5L [place] O2 with activity, 92% on 5L at rest. Will continue to follow and progress activity as tolerated.     Follow Up Recommendations Home health PT;SNF(depending on progress)    Equipment Recommendations  Rolling walker with 5" wheels    Recommendations for Other Services       Precautions / Restrictions Precautions Precautions: Fall Precaution Comments: monitor O2 sats. Pt currently on 5L [place] Restrictions Weight Bearing Restrictions: No      Mobility  Bed Mobility               General bed mobility comments: sitting EOB  Transfers Overall transfer level: Needs assistance   Transfers: Sit to/from Stand Sit to Stand: Min assist         General transfer comment: Assist to steady.  Ambulation/Gait Ambulation/Gait assistance: Min assist Gait Distance (Feet): 3 Feet(x2) Assistive device: None("furniture walk/cruising") Gait Pattern/deviations: Step-through pattern     General Gait Details: Assist to steady pt. Fatifues easily. Remained on 5L [place] O2  Stairs            Wheelchair Mobility    Modified Rankin (Stroke Patients Only)       Balance Overall balance assessment: Needs assistance           Standing balance-Leahy Scale: Fair                               Pertinent Vitals/Pain Pain Assessment: No/denies pain    Home Living Family/patient expects to be discharged to:: Private residence     Type of Home:  Apartment Home Access: Level entry       Home Equipment: (stair lift)      Prior Function Level of Independence: Independent               Hand Dominance        Extremity/Trunk Assessment   Upper Extremity Assessment Upper Extremity Assessment: Generalized weakness    Lower Extremity Assessment Lower Extremity Assessment: Generalized weakness    Cervical / Trunk Assessment Cervical / Trunk Assessment: Normal  Communication   Communication: No difficulties  Cognition Arousal/Alertness: Awake/alert Behavior During Therapy: WFL for tasks assessed/performed Overall Cognitive Status: Within Functional Limits for tasks assessed                                        General Comments      Exercises General Exercises - Lower Extremity Long Arc Quad: AROM;Both;10 reps;Seated Hip Flexion/Marching: AROM;Both;10 reps;Standing Other Exercises Other Exercises: Knee flexion, standing, 5 reps, both   Assessment/Plan    PT Assessment Patient needs continued PT services  PT Problem List Decreased mobility;Decreased balance;Decreased activity tolerance;Decreased knowledge of use of DME;Cardiopulmonary status limiting activity;Decreased strength       PT Treatment Interventions DME instruction;Therapeutic activities;Gait training;Therapeutic exercise;Balance training;Functional mobility training;Patient/family education  PT Goals (Current goals can be found in the Care Plan section)  Acute Rehab PT Goals Patient Stated Goal: to get better PT Goal Formulation: With patient Time For Goal Achievement: 05/01/19 Potential to Achieve Goals: Good    Frequency Min 3X/week   Barriers to discharge        Co-evaluation               AM-PAC PT "6 Clicks" Mobility  Outcome Measure Help needed turning from your back to your side while in a flat bed without using bedrails?: A Little Help needed moving from lying on your back to sitting on the side of  a flat bed without using bedrails?: A Little Help needed moving to and from a bed to a chair (including a wheelchair)?: A Little Help needed standing up from a chair using your arms (e.g., wheelchair or bedside chair)?: A Little Help needed to walk in hospital room?: A Little Help needed climbing 3-5 steps with a railing? : A Lot 6 Click Score: 17    End of Session Equipment Utilized During Treatment: Oxygen Activity Tolerance: Patient limited by fatigue Patient left: in chair;with call bell/phone within reach   PT Visit Diagnosis: Unsteadiness on feet (R26.81);Muscle weakness (generalized) (M62.81);Difficulty in walking, not elsewhere classified (R26.2)    Time: [id number] PT Time Calculation (min) (ACUTE ONLY): 14 min   Charges:   PT Evaluation $PT Eval Moderate Complexity: 1 Mod            Daaiyah Baumert P, PT Acute Rehabilitation

## 2019-04-18 ENCOUNTER — Inpatient Hospital Stay (HOSPITAL_COMMUNITY): Payer: Medicare Other

## 2019-04-18 LAB — CBC WITH DIFFERENTIAL/PLATELET
Abs Immature Granulocytes: 0.3 10*3/uL — ABNORMAL HIGH (ref 0.00–0.07)
Basophils Absolute: 0.1 10*3/uL (ref 0.0–0.1)
Basophils Relative: 0 %
Eosinophils Absolute: 0 10*3/uL (ref 0.0–0.5)
Eosinophils Relative: 0 %
HCT: 41.8 % (ref 36.0–46.0)
Hemoglobin: 13.7 g/dL (ref 12.0–15.0)
Immature Granulocytes: 3 %
Lymphocytes Relative: 8 %
Lymphs Abs: 0.9 10*3/uL (ref 0.7–4.0)
MCH: 30.6 pg (ref 26.0–34.0)
MCHC: 32.8 g/dL (ref 30.0–36.0)
MCV: 93.3 fL (ref 80.0–100.0)
Monocytes Absolute: 0.5 10*3/uL (ref 0.1–1.0)
Monocytes Relative: 4 %
Neutro Abs: 10 10*3/uL — ABNORMAL HIGH (ref 1.7–7.7)
Neutrophils Relative %: 85 %
Platelets: 268 10*3/uL (ref 150–400)
RBC: 4.48 MIL/uL (ref 3.87–5.11)
RDW: 14.4 % (ref 11.5–15.5)
WBC: 11.7 10*3/uL — ABNORMAL HIGH (ref 4.0–10.5)
nRBC: 0.6 % — ABNORMAL HIGH (ref 0.0–0.2)

## 2019-04-18 LAB — COMPREHENSIVE METABOLIC PANEL
ALT: 26 U/L (ref 0–44)
AST: 38 U/L (ref 15–41)
Albumin: 3 g/dL — ABNORMAL LOW (ref 3.5–5.0)
Alkaline Phosphatase: 75 U/L (ref 38–126)
Anion gap: 10 (ref 5–15)
BUN: 36 mg/dL — ABNORMAL HIGH (ref 8–23)
CO2: 20 mmol/L — ABNORMAL LOW (ref 22–32)
Calcium: 8.8 mg/dL — ABNORMAL LOW (ref 8.9–10.3)
Chloride: 115 mmol/L — ABNORMAL HIGH (ref 98–111)
Creatinine, Ser: 1.21 mg/dL — ABNORMAL HIGH (ref 0.44–1.00)
GFR calc Af Amer: 51 mL/min — ABNORMAL LOW (ref >60)
GFR calc non Af Amer: 44 mL/min — ABNORMAL LOW (ref >60)
Glucose, Bld: 224 mg/dL — ABNORMAL HIGH (ref 70–99)
Potassium: 4.7 mmol/L (ref 3.5–5.1)
Sodium: 145 mmol/L (ref 135–145)
Total Bilirubin: 0.8 mg/dL (ref 0.3–1.2)
Total Protein: 6.7 g/dL (ref 6.5–8.1)

## 2019-04-18 LAB — GLUCOSE, CAPILLARY
Glucose-Capillary: 230 mg/dL — ABNORMAL HIGH (ref 70–99)
Glucose-Capillary: 252 mg/dL — ABNORMAL HIGH (ref 70–99)
Glucose-Capillary: 289 mg/dL — ABNORMAL HIGH (ref 70–99)
Glucose-Capillary: 260 mg/dL — ABNORMAL HIGH (ref 70–99)

## 2019-04-18 LAB — MAGNESIUM: Magnesium: 2.3 mg/dL (ref 1.7–2.4)

## 2019-04-18 LAB — PHOSPHORUS: Phosphorus: 3.1 mg/dL (ref 2.5–4.6)

## 2019-04-18 LAB — D-DIMER, QUANTITATIVE: D-Dimer, Quant: 2.25 ug/mL-FEU — ABNORMAL HIGH (ref 0.00–0.50)

## 2019-04-18 LAB — C-REACTIVE PROTEIN: CRP: 7 mg/dL — ABNORMAL HIGH (ref <1.0)

## 2019-04-18 LAB — PROCALCITONIN: Procalcitonin: <0.1 ng/mL

## 2019-04-18 LAB — FERRITIN: Ferritin: 529 ng/mL — ABNORMAL HIGH (ref 11–307)

## 2019-04-18 MED ORDER — MINOXIDIL 2.5 MG PO TABS
2.5000 mg | ORAL_TABLET | Freq: Two times a day (BID) | ORAL | Status: DC
  Administered 2019-04-18 – 2019-04-22 (×9): 2.5 mg via ORAL
  Filled 2019-04-18 (×10): qty 1

## 2019-04-18 MED ORDER — FUROSEMIDE 20 MG PO TABS
20.0000 mg | ORAL_TABLET | Freq: Every day | ORAL | Status: DC
  Administered 2019-04-19 – 2019-04-22 (×4): 20 mg via ORAL
  Filled 2019-04-18 (×4): qty 1

## 2019-04-18 MED ORDER — FUROSEMIDE 10 MG/ML IJ SOLN
40.0000 mg | Freq: Once | INTRAMUSCULAR | Status: AC
  Administered 2019-04-18: 40 mg via INTRAVENOUS
  Filled 2019-04-18: qty 4

## 2019-04-18 MED ORDER — ORAL CARE MOUTH RINSE
15.0000 mL | Freq: Two times a day (BID) | OROMUCOSAL | Status: DC
  Administered 2019-04-18 – 2019-04-22 (×8): 15 mL via OROMUCOSAL

## 2019-04-18 MED ORDER — METHYLPREDNISOLONE SODIUM SUCC 40 MG IJ SOLR
30.0000 mg | Freq: Two times a day (BID) | INTRAMUSCULAR | Status: DC
  Administered 2019-04-18 – 2019-04-22 (×9): 30 mg via INTRAVENOUS
  Filled 2019-04-18 (×9): qty 1

## 2019-04-18 MED ORDER — INSULIN DETEMIR 100 UNIT/ML ~~LOC~~ SOLN
25.0000 [IU] | Freq: Two times a day (BID) | SUBCUTANEOUS | Status: DC
  Administered 2019-04-18 – 2019-04-22 (×9): 25 [IU] via SUBCUTANEOUS
  Filled 2019-04-18 (×10): qty 0.25

## 2019-04-18 NOTE — Progress Notes (Signed)
[facility] PROGRESS NOTE    Progress Note  Jade Sullivan  [id number] DOB: 03-13-1946 DOA: 04/13/2019 PCP: Jade Koch, MD     Brief Narrative:   Jade Sullivan is an 74 y.o. female past medical history of COVID-19 infection on April 05, 2019, chronic disease stage III, hypothyroidism hypertension, came back to the emergency room when she was not able to tolerate her oral, was found to be satting greater than 95% hydrated and discharged back home came back on the day of admission to be hypoxic requiring 4 L of oxygen to keep saturations greater than 93%, tidal infiltrates.  Assessment/Plan:   Acte respiratory failure with hypoxia secondarily to Pneumonia due to COVID-19 virus Jade Sullivan is currently requiring 8 L of high flow nasal cannula to keep saturations greater than 94% this is an increase from yesterday. Completed her course of IV remdesivir, will continue IV steroids for 10 days. She is status post convalescent plasma and IV Actemra on 04/13/2018. Her inflammatory markers are slowly improving. She has not been compliant with proning nor staying out of the bed into the chair.  Related the patient and recommended home health PT versus skilled nursing facility if she cannot have 24-hour supervision.  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  New mild hypovolemic hypernatremia: I's and O's have been poorly recorded, she remains positive about 5 L. She was started on IV diuresis and her hyper natremia is improving.  Her BNP was elevated on admission with a small pleural effusion. 2D echo showed a preserved EF but grade 2 diastolic heart failure.  Chronic diastolic heart failure: 2D echo done on 04/17/2019, showed a preserved EF with grade 2 diastolic heart failure. She was  restarted on her ACE inhibitor, Aldactone, continue on her beta-blocker and she was started on low-dose diuretic therapy, which she will probably continue as an outpatient.  Hyperlipidemia: Continue current home regimen.  Obesity, unspecified: Counseling.  New onset diabetes mellitus: With an A1c of 9.8, she is currently on steroids which will make her blood glucose erratic. Continue long-acting insulin plus resistant sliding scale, will increase her long-acting insulin as her blood glucose was trending up.  DVT prophylaxis: Lovenox Family Communication:none Disposition Plan/Barrier to D/C: She came home from home, she will probably go home when she is off her oxygen or 3 L of oxygen or less Code Status:     Code Status Orders  (From admission, onward)         Start     Ordered   04/13/19 2310  Full code  Continuous     04/13/19 2309        Code Status History    This patient has a current code status but no historical code status.   Advance Care Planning Activity        IV Access:    Peripheral IV   Procedures and diagnostic studies:   DG CHEST PORT 1 VIEW  Result Date: 04/17/2019 CLINICAL DATA:  Shortness of breath. COVID positive. EXAM: PORTABLE CHEST 1 VIEW COMPARISON:  Radiograph earlier this day at 63 hour FINDINGS: Cardiomegaly is unchanged. Diffuse bilateral heterogeneous pulmonary opacities, confluent in the left lower lobe, not significantly changed. Possible left pleural effusion. No pneumothorax. IMPRESSION: No significant change in diffuse bilateral heterogeneous pulmonary opacities, confluent in the left lower  lobe. Possible left pleural effusion. Stable cardiomegaly. Electronically Signed   By: Jade Sullivan M.D.   On: 04/17/2019 00:19   DG CHEST PORT 1 VIEW  Result Date: 04/16/2019 CLINICAL DATA:  COVID-19 infection. EXAM: PORTABLE CHEST 1 VIEW COMPARISON:  Chest x-ray 04/13/2019. FINDINGS: Stable cardiomegaly. Progressive severe diffuse  bilateral pulmonary infiltrates/edema. Low lung volumes. Left pleural effusion cannot be excluded. No pneumothorax. IMPRESSION: 1.  Stable cardiomegaly. 2. Progressive severe diffuse bilateral pulmonary infiltrates/edema. Low lung volumes. Left pleural effusion cannot be excluded. Electronically Signed   By: Jade Sullivan  Register   On: 04/16/2019 09:14   ECHOCARDIOGRAM LIMITED  Result Date: 04/16/2019   ECHOCARDIOGRAM LIMITED REPORT   Patient Name:   Jade Sullivan Date of Exam: 04/16/2019 Medical Rec #:  [id number]       Height:       65.0 in Accession #:    [id number]      Weight:       210.0 lb Date of Birth:  [birth year]       BSA:          2.02 m Patient Age:    76 years        BP:           144/84 mmHg Patient Gender: F               HR:           67 bpm. Exam Location:  Inpatient  Procedure: Limited Echo, Limited Color Doppler and Cardiac Doppler Indications:    Chest Pain 786.50 / R07.9Chest Pain 786.50 / R07.9  History:        Patient has no prior history of Echocardiogram examinations.                 Covid 19. Chronic kidney disease. thyroid disease.  Sonographer:    Jade Sullivan RDCS Referring Phys: Jade Sullivan  1. Left ventricular ejection fraction, by visual estimation, is 55 to 60%. The left ventricle has normal function. There is severely increased left ventricular wall thickness.  2. Left ventricular diastolic parameters are consistent with Grade II diastolic dysfunction (pseudonormalization).  3. Elevated left atrial pressure.  4. Global right ventricle has normal systolc function.The right ventricular size is normal.  5. Left atrial size was severely dilated.  6. Moderate pericardial effusion, measuring up to 1.7cm adjacent to LV lateral wall.  7. The mitral valve is normal in structure. Mild mitral valve regurgitation.  8. The tricuspid valve was normal in structure. Tricuspid valve regurgitation is trivial.  9. The aortic valve is tricuspid. Aortic valve regurgitation is mild to  moderate. The aortic valve is has no stenosis. 10. 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 11. Severely dilated pulmonary artery, measures 54mm 12. The inferior vena cava is normal in size with greater than 50% respiratory variability, suggesting right atrial pressure of 3 mmHg. 13. The tricuspid regurgitant velocity is 2.74 m/s, and with an assumed right atrial pressure of 3 mmHg, the estimated right ventricular systolic pressure is mildly elevated at 33.0 mmHg. 14. Normal LV systolic function, Grade 2 diastolic dysfunction, normal RV function, mild-moderate AI, ascending aorta dilatation (69mm), severe main pulmonary artery dilatation (79mm), RVSP 33, moderate pericardial effusion without evidence of tamponade FINDINGS  Left Ventricle: Left ventricular ejection fraction, by visual estimation, is 55 to 60%. The left ventricle has normal function. The left ventricle has no regional wall motion abnormalities. There is severely increased left ventricular  wall thickness. Left ventricular diastolic parameters are consistent with Grade II diastolic dysfunction (pseudonormalization). Elevated left atrial pressure. Right Ventricle: The right ventricular size is normal. Global RV systolic function is has normal systolic function. The tricuspid regurgitant velocity is 2.74 m/s, and with an assumed right atrial pressure of 3 mmHg, the estimated right ventricular systolic pressure is mildly elevated at 33.0 mmHg. Left Atrium: Left atrial size was severely dilated. Pericardium: A moderately sized pericardial effusion is present is seen. A moderately sized pericardial effusion is present. Mitral Valve: The mitral valve is normal in structure. MV Area by PHT, 3.46 cm. MV PHT, 63.51 msec. Mild mitral valve regurgitation. Tricuspid Valve: The tricuspid valve is normal in structure. Tricuspid valve regurgitation is trivial. Aortic Valve: The aortic valve is tricuspid. Aortic valve regurgitation is mild to  moderate. Aortic regurgitation PHT measures 678 msec. The aortic valve is structurally normal, with no evidence of sclerosis or stenosis. Pulmonic Valve: The pulmonic valve was not well visualized. Pulmonic valve regurgitation is mild by color flow Doppler. Pulmonic regurgitation is mild by color flow Doppler. Aorta: Aortic dilatation noted. 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 Pulmonary Artery: The pulmonary artery is severely dilated. Venous: The inferior vena cava is normal in size with greater than 50% respiratory variability, suggesting right atrial pressure of 3 mmHg.  LEFT VENTRICLE          Normals PLAX 2D LVIDd:         4.30 cm  3.6 cm   Diastology                 Normals LVIDs:         3.00 cm  1.7 cm   LV e' lateral:   5.33 cm/s 6.42 cm/s LV PW:         1.60 cm  1.4 cm   LV E/e' lateral: 19.9      15.4 LV IVS:        1.90 cm  1.3 cm   LV e' medial:    4.35 cm/s 6.96 cm/s LVOT diam:     2.10 cm  2.0 cm   LV E/e' medial:  24.4      6.96 LV SV:         48 ml    79 ml LV SV Index:   22.57    45 ml/m2 LVOT Area:     3.46 cm 3.14 cm2  LEFT ATRIUM              Index LA diam:        3.70 cm  1.83 cm/m LA Vol (A2C):   125.0 ml 61.89 ml/m LA Vol (A4C):   94.1 ml  46.59 ml/m LA Biplane Vol: 114.0 ml 56.45 ml/m  AORTIC VALVE          Normals AI PHT:      678 msec  AORTA                 Normals Ao Root diam: 3.50 cm 31 mm Ao Asc diam:  4.10 cm 31 mm MITRAL VALVE              Normals    TRICUSPID VALVE             Normals MV Area (PHT): 3.46 cm              TR Peak grad:   30.0 mmHg MV PHT:  63.51 msec 55 ms      TR Vmax:        274.00 cm/s 288 cm/s MV Decel Time: 219 msec   187 ms MV E velocity: 106.00 cm/s 103 cm/s  SHUNTS MV A velocity: 87.50 cm/s  70.3 cm/s Systemic Diam: 2.10 cm MV E/A ratio:  1.21        1.5  Oswaldo Milian MD Electronically signed by Oswaldo Milian MD Signature Date/Time: 04/16/2019/2:01:31 PMThe mitral valve is normal in structure.    Final       Medical Consultants:    None.  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 Objective:    Vitals:   04/17/19 2044 04/18/19 0550 04/18/19 0815 04/18/19 0834  BP: (!) 173/103 (!) 159/88    Pulse: (!) 58 (!) 52    Resp: 18 18    Temp: 98.2 F (36.8 C) 98.8 F (37.1 C)    TempSrc: Oral Oral    SpO2: 97% 94% (!) 84% 96%  Weight:      Height:       SpO2: 96 % O2 Flow Rate (L/min): 8 L/min   Intake/Output Summary (Last 24 hours) at 04/18/2019 0839 Last data filed at 04/17/2019 1601 Gross per 24 hour  Intake 200 ml  Output 200 ml  Net 0 ml   Filed Weights   04/13/19 1938  Weight: 95.3 kg    Exam: General exam: In no acute distress. Respiratory system: Good air movement and diffuse crackles bilaterally Cardiovascular system: S1 & S2 heard, RRR. No JVD. Gastrointestinal system: Abdomen is nondistended, soft and nontender.  Extremities: No pedal edema. Skin: No rashes, lesions or ulcers  Data Reviewed:    Labs: Basic Metabolic Panel: Recent Labs  Lab 04/14/19 0407 04/14/19 0407 04/15/19 0216 04/15/19 0216 04/16/19 0145 04/16/19 0145 04/17/19 0207 04/18/19 0233  NA 143  --  145  --  147*  --  148* 145  K 4.1   < > 4.2   < > 4.3   < > 3.9 4.7  CL 112*  --  115*  --  118*  --  118* 115*  CO2 21*  --  21*  --  20*  --  19* 20*  GLUCOSE 262*  --  218*  --  131*  --  115* 224*  BUN 30*  --  35*  --  38*  --  33* 36*  CREATININE 1.64*  --  1.31*  --  1.27*  --  1.13* 1.21*  CALCIUM 8.4*  --  8.4*  --  8.4*  --  8.3* 8.8*  MG 2.0  --  2.2  --  2.4  --  2.2 2.3  PHOS 2.3*  --  1.9*  --  2.2*  --  2.8 3.1   < > = values in this interval not displayed.   GFR Estimated Creatinine Clearance: 47.3 mL/min (A) (by C-G formula based on SCr of 1.21 mg/dL (H)). Liver Function Tests: Recent Labs  Lab 04/14/19 0407 04/15/19 0216 04/16/19 0145 04/17/19 0207  04/18/19 0233  AST 48* 48* 52* 44* 38  ALT 21 23 22 24 26   ALKPHOS 66 71 77 71 75  BILITOT 0.5 0.7 0.6 0.8 0.8  PROT 7.0 7.3 7.0 6.7 6.7  ALBUMIN 3.0* 3.1* 3.0* 2.8* 3.0*   No results for input(s): LIPASE, AMYLASE in the last 168 hours. No  results for input(s): AMMONIA in the last 168 hours. Coagulation profile No results for input(s): INR, PROTIME in the last 168 hours. COVID-19 Labs  Recent Labs    04/16/19 0145 04/17/19 0207 04/18/19 0233  DDIMER 1.65* 1.96* 2.25*  FERRITIN 815* 634* 529*  CRP 14.5* 9.6* 7.0*    No results found for: SARSCOV2NAA  CBC: Recent Labs  Lab 04/14/19 0407 04/15/19 0216 04/16/19 0145 04/17/19 0207 04/18/19 0233  WBC 7.4 8.6 10.7* 11.0* 11.7*  NEUTROABS 6.0 6.9 9.1* 9.2* 10.0*  HGB 12.3 12.4 12.3 12.9 13.7  HCT 37.7 38.4 38.6 39.4 41.8  MCV 92.6 92.8 94.4 93.4 93.3  PLT 180 227 240 241 268   Cardiac Enzymes: No results for input(s): CKTOTAL, CKMB, CKMBINDEX, TROPONINI in the last 168 hours. BNP (last 3 results) No results for input(s): PROBNP in the last 8760 hours. CBG: Recent Labs  Lab 04/17/19 0804 04/17/19 1142 04/17/19 1557 04/17/19 2224 04/18/19 0755  GLUCAP 79 141* 252* 222* 230*   D-Dimer: Recent Labs    04/17/19 0207 04/18/19 0233  DDIMER 1.96* 2.25*   Hgb A1c: No results for input(s): HGBA1C in the last 72 hours. Lipid Profile: No results for input(s): CHOL, HDL, LDLCALC, TRIG, CHOLHDL, LDLDIRECT in the last 72 hours. Thyroid function studies: No results for input(s): TSH, T4TOTAL, T3FREE, THYROIDAB in the last 72 hours.  Invalid input(s): FREET3 Anemia work up: Recent Labs    04/17/19 0207 04/18/19 0233  FERRITIN 634* 529*   Sepsis Labs: Recent Labs  Lab 04/13/19 1953 04/13/19 2324 04/14/19 0407 04/15/19 0216 04/16/19 0145 04/16/19 0845 04/17/19 0207 04/18/19 0233  PROCALCITON 0.24  --   --   --   --  <0.10 <0.10 <0.10  WBC 6.8  --    < > 8.6 10.7*  --  11.0* 11.7*  LATICACIDVEN 2.8* 1.5   --   --   --   --   --   --    < > = values in this interval not displayed.   Microbiology Recent Results (from the past 240 hour(s))  Blood Culture (routine x 2)     Status: None (Preliminary result)   Collection Time: 04/13/19  7:50 PM   Specimen: BLOOD  Result Value Ref Range Status   Specimen Description   Final    BLOOD LEFT ANTECUBITAL Performed at [facility] [address] [place], [place] [address]    Special Requests   Final    BOTTLES DRAWN AEROBIC ONLY Blood Culture results may not be optimal due to an excessive volume of blood received in culture bottles Performed at [facility] [address] [place], [place] [address]    Culture   Final    NO GROWTH 4 DAYS Performed at [facility], [place] [address] [place], [place] [address]    Report Status PENDING  Incomplete  Blood Culture (routine x 2)     Status: None (Preliminary result)   Collection Time: 04/13/19  7:53 PM   Specimen: BLOOD  Result Value Ref Range Status   Specimen Description   Final    BLOOD BLOOD LEFT FOREARM Performed at [facility] [address] [place], [place] [address]    Special Requests   Final    BOTTLES DRAWN AEROBIC AND ANAEROBIC Blood Culture adequate volume Performed at [facility] [address], [place], [place] [address]    Culture   Final    NO GROWTH 4  DAYS Performed at [facility], [place] [address] [place], [place] [address]    Report Status PENDING  Incomplete     Medications:   . atorvastatin  20 mg Oral Daily  . cloNIDine  0.3 mg Transdermal Weekly  . cloNIDine  0.2 mg Oral BID  . enoxaparin (LOVENOX) injection  40 mg Subcutaneous Q24H  . insulin aspart  0-15 Units Subcutaneous TID WC  . insulin aspart  0-5 Units Subcutaneous QHS  . insulin aspart  6 Units Subcutaneous TID WC  . insulin detemir  20 Units Subcutaneous BID  . latanoprost  1 drop Both Eyes QHS  .  levothyroxine  50 mcg Oral Q0600  . losartan  100 mg Oral Daily  . methylPREDNISolone (SOLU-MEDROL) injection  40 mg Intravenous Q12H  . nebivolol  5 mg Oral Daily  . sodium chloride flush  3 mL Intravenous Q12H  . spironolactone  100 mg Oral Daily  . timolol  1 drop Both Eyes BID   Continuous Infusions:    LOS: 5 days   Charlynne Cousins  [facility]  04/18/2019, 8:39 AM

## 2019-04-18 NOTE — Progress Notes (Signed)
Paged Venetia Constable, MD about patient's increased oxygen requirements and asked to review chart and call patient nurse.

## 2019-04-19 ENCOUNTER — Inpatient Hospital Stay (HOSPITAL_COMMUNITY): Payer: Medicare Other

## 2019-04-19 LAB — CULTURE, BLOOD (ROUTINE X 2)
Culture: NO GROWTH
Culture: NO GROWTH
Special Requests: ADEQUATE

## 2019-04-19 LAB — BASIC METABOLIC PANEL
Anion gap: 9 (ref 5–15)
BUN: 33 mg/dL — ABNORMAL HIGH (ref 8–23)
CO2: 21 mmol/L — ABNORMAL LOW (ref 22–32)
Calcium: 8.9 mg/dL (ref 8.9–10.3)
Chloride: 112 mmol/L — ABNORMAL HIGH (ref 98–111)
Creatinine, Ser: 1.13 mg/dL — ABNORMAL HIGH (ref 0.44–1.00)
GFR calc Af Amer: 56 mL/min — ABNORMAL LOW (ref >60)
GFR calc non Af Amer: 48 mL/min — ABNORMAL LOW (ref >60)
Glucose, Bld: 133 mg/dL — ABNORMAL HIGH (ref 70–99)
Potassium: 4.4 mmol/L (ref 3.5–5.1)
Sodium: 142 mmol/L (ref 135–145)

## 2019-04-19 LAB — C-REACTIVE PROTEIN: CRP: 4.7 mg/dL — ABNORMAL HIGH (ref <1.0)

## 2019-04-19 LAB — D-DIMER, QUANTITATIVE: D-Dimer, Quant: 2.42 ug/mL-FEU — ABNORMAL HIGH (ref 0.00–0.50)

## 2019-04-19 LAB — GLUCOSE, CAPILLARY
Glucose-Capillary: 123 mg/dL — ABNORMAL HIGH (ref 70–99)
Glucose-Capillary: 242 mg/dL — ABNORMAL HIGH (ref 70–99)
Glucose-Capillary: 292 mg/dL — ABNORMAL HIGH (ref 70–99)
Glucose-Capillary: 266 mg/dL — ABNORMAL HIGH (ref 70–99)

## 2019-04-19 NOTE — Evaluation (Signed)
Occupational Therapy Evaluation Patient Details Name: Jade Sullivan MRN: [id number] DOB: 1946-02-19 Today's Date: 04/19/2019    History of Present Illness 74 yo female admitted with COVID Pna, new onset DM. Hx of CKD, gout, obesity   Clinical Impression   Pt admitted with COVID. Pt currently with functional limitations due to the deficits listed below (see OT Problem List).  Pt will benefit from skilled OT to increase their safety and independence with ADL and functional mobility for ADL to facilitate discharge to venue listed below.      Follow Up Recommendations  Home health OT;Supervision/Assistance - 24 hour    Equipment Recommendations  None recommended by OT    Recommendations for Other Services       Precautions / Restrictions Precautions Precautions: Fall Precaution Comments: monitor O2 sats. Pt currently on 5L [place] Restrictions Weight Bearing Restrictions: No      Mobility Bed Mobility Overal bed mobility: Needs Assistance Bed Mobility: Supine to Sit;Sit to Supine     Supine to sit: Min assist Sit to supine: Min assist   General bed mobility comments: A with BLE  Transfers Overall transfer level: Needs assistance Equipment used: Rolling walker (2 wheeled) Transfers: Sit to/from [facility] Sit to Stand: Min assist Stand pivot transfers: Min assist       General transfer comment: Assist to steady.    Balance Overall balance assessment: Needs assistance Sitting-balance support: Bilateral upper extremity supported Sitting balance-Leahy Scale: Fair       Standing balance-Leahy Scale: Fair                             ADL either performed or assessed with clinical judgement   ADL Overall ADL's : Needs assistance/impaired Eating/Feeding: Set up;Sitting   Grooming: Standing;Min guard   Upper Body Bathing: Set up;Sitting   Lower Body Bathing: Moderate assistance;Sit to/from stand;Cueing for safety;Cueing for  sequencing;Cueing for compensatory techniques   Upper Body Dressing : Set up;Sitting   Lower Body Dressing: Moderate assistance;Sit to/from stand;Cueing for compensatory techniques;Cueing for safety;Cueing for sequencing   Toilet Transfer: Minimal assistance;BSC;Stand-pivot;Cueing for safety;Cueing for sequencing   Toileting- Clothing Manipulation and Hygiene: Minimal assistance;Sit to/from stand         General ADL Comments: pt Very pleasant and participative!     Vision Patient Visual Report: No change from baseline              Pertinent Vitals/Pain Pain Assessment: No/denies pain        Extremity/Trunk Assessment         Cervical / Trunk Assessment Cervical / Trunk Assessment: Normal   Communication Communication Communication: No difficulties   Cognition Arousal/Alertness: Awake/alert Behavior During Therapy: WFL for tasks assessed/performed Overall Cognitive Status: Within Functional Limits for tasks assessed                                                Home Living Family/patient expects to be discharged to:: Private residence Living Arrangements: Children Available Help at Discharge: Family Type of Home: Apartment Home Access: Level entry     Home Layout: One level     Bathroom Shower/Tub: Teacher, early years/pre: Standard     Home Equipment: None(stair lift)          Prior Functioning/Environment Level of Independence:  Independent                 OT Problem List: Decreased strength;Decreased activity tolerance;Decreased safety awareness;Decreased knowledge of use of DME or AE;Impaired balance (sitting and/or standing);Obesity      OT Treatment/Interventions: Self-care/ADL training;Energy conservation;Patient/family education;Therapeutic activities    OT Goals(Current goals can be found in the care plan section) Acute Rehab OT Goals Patient Stated Goal: to get better OT Goal Formulation: With  patient Time For Goal Achievement: 04/26/19 Potential to Achieve Goals: Good ADL Goals Pt Will Perform Lower Body Bathing: with modified independence;sit to/from stand Pt Will Perform Lower Body Dressing: with modified independence;sit to/from stand Pt Will Transfer to Toilet: with modified independence;regular height toilet Pt Will Perform Toileting - Clothing Manipulation and hygiene: with modified independence;sit to/from stand  OT Frequency: Min 2X/week              AM-PAC OT "6 Clicks" Daily Activity     Outcome Measure Help from another person eating meals?: None Help from another person taking care of personal grooming?: A Little Help from another person toileting, which includes using toliet, bedpan, or urinal?: A Little Help from another person bathing (including washing, rinsing, drying)?: A Little Help from another person to put on and taking off regular upper body clothing?: A Little Help from another person to put on and taking off regular lower body clothing?: A Little 6 Click Score: 19   End of Session Nurse Communication: Mobility status  Activity Tolerance: Patient limited by fatigue Patient left: in bed;with call bell/phone within reach  OT Visit Diagnosis: Unsteadiness on feet (R26.81);Repeated falls (R29.6);Muscle weakness (generalized) (M62.81);History of falling (Z91.81)                Charges:  OT General Charges $OT Visit: 1 Visit OT Evaluation $OT Eval Moderate Complexity: [place], OT Acute Rehabilitation Services [phone] Office- [phone], Edwena Felty D 04/19/2019, 6:03 PM

## 2019-04-19 NOTE — Progress Notes (Signed)
[facility] PROGRESS NOTE    Progress Note  Jade Sullivan  [id number] DOB: 07/13/1945 DOA: 04/13/2019 PCP: Hoyt Koch, MD     Brief Narrative:   Jade Sullivan is an 74 y.o. female past medical history of COVID-19 infection on April 05, 2019, chronic disease stage III, hypothyroidism hypertension, came back to the emergency room when she was not able to tolerate her oral, was found to be satting greater than 95% hydrated and discharged back home came back on the day of admission to be hypoxic requiring 4 L of oxygen to keep saturations greater than 93%, tidal infiltrates.  Assessment/Plan:   Acte respiratory failure with hypoxia secondarily to Pneumonia due to COVID-19 virus Jade Sullivan is currently down to 6 L of oxygen to keep saturations greater than 94%, he needs to be weaned down to room air as much as possible. She has completed her course of IV remdesivir and steroids will continue steroids for 10 days. She is status post convalescent plasma and IV Actemra on 04/13/2018 Her inflammatory markers are improving they are pending this morning. Patient has not been compliant with proning, physical therapy evaluated the patient the recommended skilled versus 24-hour supervision at home.  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  New mild hypovolemic hypernatremia: Resolved with IV diuresis, BNP was elevated on admission and she had a small pleural effusion. A 2D echo showed preserved ejection fraction.  Grade 2 diastolic heart failure.  Chronic diastolic heart failure: 2D echo done on 04/17/2019 that showed a preserved EF with a grade 2 diastolic heart failure, she was started on ACE inhibitor Aldactone and continue on her beta-blocker, on 04/18/2019 she restarted on low-dose diuretic  and her blood pressure as well as her hyponatremia has remained stable.  Hyperlipidemia: Continue current home regimen.  Obesity, unspecified: Counseling.  New onset diabetes mellitus: With an A1c of 9.8, currently on long-acting insulin plus resistant sliding scale due to steroids, blood glucose fairly controlled.  DVT prophylaxis: Lovenox Family Communication:none Disposition Plan/Barrier to D/C: She came home from home, she will probably go home when she is off her oxygen or 3 L of oxygen or less Code Status:     Code Status Orders  (From admission, onward)         Start     Ordered   04/13/19 2310  Full code  Continuous     04/13/19 2309        Code Status History    This patient has a current code status but no historical code status.   Advance Care Planning Activity        IV Access:    Peripheral IV   Procedures and diagnostic studies:   DG CHEST PORT 1 VIEW  Result Date: 04/18/2019 CLINICAL DATA:  COVID-19 infection. EXAM: PORTABLE CHEST 1 VIEW COMPARISON:  April 17, 2019. FINDINGS: Stable cardiomegaly. No pneumothorax or pleural effusion is noted. Stable bilateral ill-defined lung opacities are noted consistent with multifocal pneumonia. Bony thorax is unremarkable. IMPRESSION: Stable bilateral lung opacities are noted consistent with multifocal pneumonia. Electronically Signed   By: Marijo Conception M.D.   On: 04/18/2019 14:23     Medical Consultants:    None.  Anti-Infectives:   None  Subjective:    Jade Sullivan she relates her breathing is about the same  as yesterday, she still remains anorexic. Objective:    Vitals:   04/18/19 1625 04/18/19 1705 04/18/19 2113 04/19/19 0534  BP:   (!) 149/93 (!) 147/90  Pulse:   60 (!) 51  Resp:    20  Temp:   (!) 97.5 F (36.4 C) 97.9 F (36.6 C)  TempSrc:   Oral Oral  SpO2: 95% 96% 95% 96%  Weight:      Height:       SpO2: 96 % O2 Flow Rate (L/min): 6 L/min   Intake/Output Summary  (Last 24 hours) at 04/19/2019 0816 Last data filed at 04/18/2019 1900 Gross per 24 hour  Intake 237 ml  Output --  Net 237 ml   Filed Weights   04/13/19 1938  Weight: 95.3 kg    Exam: General exam: In no acute distress. Respiratory system: Good air movement and diffuse crackles bilaterally Cardiovascular system: S1 & S2 heard, RRR. No JVD. Gastrointestinal system: Abdomen is nondistended, soft and nontender.  Extremities: No pedal edema. Skin: No rashes, lesions or ulcers  Data Reviewed:    Labs: Basic Metabolic Panel: Recent Labs  Lab 04/14/19 0407 04/14/19 0407 04/15/19 0216 04/15/19 0216 04/16/19 0145 04/16/19 0145 04/17/19 0207 04/17/19 0207 04/18/19 0233 04/19/19 0242  NA 143   < > 145  --  147*  --  148*  --  145 142  K 4.1   < > 4.2   < > 4.3   < > 3.9   < > 4.7 4.4  CL 112*   < > 115*  --  118*  --  118*  --  115* 112*  CO2 21*   < > 21*  --  20*  --  19*  --  20* 21*  GLUCOSE 262*   < > 218*  --  131*  --  115*  --  224* 133*  BUN 30*   < > 35*  --  38*  --  33*  --  36* 33*  CREATININE 1.64*   < > 1.31*  --  1.27*  --  1.13*  --  1.21* 1.13*  CALCIUM 8.4*   < > 8.4*  --  8.4*  --  8.3*  --  8.8* 8.9  MG 2.0  --  2.2  --  2.4  --  2.2  --  2.3  --   PHOS 2.3*  --  1.9*  --  2.2*  --  2.8  --  3.1  --    < > = values in this interval not displayed.   GFR Estimated Creatinine Clearance: 50.6 mL/min (A) (by C-G formula based on SCr of 1.13 mg/dL (H)). Liver Function Tests: Recent Labs  Lab 04/14/19 0407 04/15/19 0216 04/16/19 0145 04/17/19 0207 04/18/19 0233  AST 48* 48* 52* 44* 38  ALT 21 23 22 24 26   ALKPHOS 66 71 77 71 75  BILITOT 0.5 0.7 0.6 0.8 0.8  PROT 7.0 7.3 7.0 6.7 6.7  ALBUMIN 3.0* 3.1* 3.0* 2.8* 3.0*   No results for input(s): LIPASE, AMYLASE in the last 168 hours. No results for input(s): AMMONIA in the last 168 hours. Coagulation profile No results for input(s): INR, PROTIME in the last 168 hours. COVID-19 Labs  Recent Labs     04/17/19 0207 04/18/19 0233  DDIMER 1.96* 2.25*  FERRITIN 634* 529*  CRP 9.6* 7.0*    No results found for: SARSCOV2NAA  CBC: Recent Labs  Lab 04/14/19 0407 04/15/19 0216 04/16/19 0145  04/17/19 0207 04/18/19 0233  WBC 7.4 8.6 10.7* 11.0* 11.7*  NEUTROABS 6.0 6.9 9.1* 9.2* 10.0*  HGB 12.3 12.4 12.3 12.9 13.7  HCT 37.7 38.4 38.6 39.4 41.8  MCV 92.6 92.8 94.4 93.4 93.3  PLT 180 227 240 241 268   Cardiac Enzymes: No results for input(s): CKTOTAL, CKMB, CKMBINDEX, TROPONINI in the last 168 hours. BNP (last 3 results) No results for input(s): PROBNP in the last 8760 hours. CBG: Recent Labs  Lab 04/18/19 0755 04/18/19 1138 04/18/19 1655 04/18/19 2110 04/19/19 0742  GLUCAP 230* 252* 289* 260* 123*   D-Dimer: Recent Labs    04/17/19 0207 04/18/19 0233  DDIMER 1.96* 2.25*   Hgb A1c: No results for input(s): HGBA1C in the last 72 hours. Lipid Profile: No results for input(s): CHOL, HDL, LDLCALC, TRIG, CHOLHDL, LDLDIRECT in the last 72 hours. Thyroid function studies: No results for input(s): TSH, T4TOTAL, T3FREE, THYROIDAB in the last 72 hours.  Invalid input(s): FREET3 Anemia work up: Recent Labs    04/17/19 0207 04/18/19 0233  FERRITIN 634* 529*   Sepsis Labs: Recent Labs  Lab 04/13/19 1953 04/13/19 2324 04/14/19 0407 04/15/19 0216 04/16/19 0145 04/16/19 0845 04/17/19 0207 04/18/19 0233  PROCALCITON 0.24  --   --   --   --  <0.10 <0.10 <0.10  WBC 6.8  --    < > 8.6 10.7*  --  11.0* 11.7*  LATICACIDVEN 2.8* 1.5  --   --   --   --   --   --    < > = values in this interval not displayed.   Microbiology Recent Results (from the past 240 hour(s))  Blood Culture (routine x 2)     Status: None (Preliminary result)   Collection Time: 04/13/19  7:50 PM   Specimen: BLOOD  Result Value Ref Range Status   Specimen Description   Final    BLOOD LEFT ANTECUBITAL Performed at [facility] [address], [place], [place] [address]     Special Requests   Final    BOTTLES DRAWN AEROBIC ONLY Blood Culture results may not be optimal due to an excessive volume of blood received in culture bottles Performed at [facility] [address], [place], [place] [address]    Culture   Final    NO GROWTH 4 DAYS Performed at [facility], [place] [address] [place], [place] [address]    Report Status PENDING  Incomplete  Blood Culture (routine x 2)     Status: None (Preliminary result)   Collection Time: 04/13/19  7:53 PM   Specimen: BLOOD  Result Value Ref Range Status   Specimen Description   Final    BLOOD BLOOD LEFT FOREARM Performed at [facility] [address] [place], [place] [address]    Special Requests   Final    BOTTLES DRAWN AEROBIC AND ANAEROBIC Blood Culture adequate volume Performed at [facility] [address] [place], [place] [address]    Culture   Final    NO GROWTH 4 DAYS Performed at [facility], [place] [address], [place], [place] [address]    Report Status PENDING  Incomplete     Medications:   . atorvastatin  20 mg Oral Daily  . cloNIDine  0.3 mg Transdermal Weekly  . cloNIDine  0.2 mg Oral BID  . enoxaparin (LOVENOX) injection  40 mg Subcutaneous Q24H  . furosemide  20 mg Oral Daily  .  insulin aspart  0-15 Units Subcutaneous TID WC  . insulin aspart  0-5 Units Subcutaneous QHS  . insulin aspart  6 Units Subcutaneous TID WC  . insulin detemir  25 Units Subcutaneous BID  . latanoprost  1 drop Both Eyes QHS  . levothyroxine  50 mcg Oral Q0600  . losartan  100 mg Oral Daily  . mouth rinse  15 mL Mouth Rinse BID  . methylPREDNISolone (SOLU-MEDROL) injection  30 mg Intravenous Q12H  . minoxidil  2.5 mg Oral BID  . nebivolol  5 mg Oral Daily  . sodium chloride flush  3 mL Intravenous Q12H  . spironolactone  100 mg Oral Daily  . timolol  1 drop Both Eyes BID   Continuous Infusions:    LOS: 6 days   Charlynne Cousins  [facility]  04/19/2019, 8:16 AM

## 2019-04-20 ENCOUNTER — Telehealth: Payer: Self-pay | Admitting: Emergency Medicine

## 2019-04-20 LAB — BASIC METABOLIC PANEL
Anion gap: 8 (ref 5–15)
BUN: 32 mg/dL — ABNORMAL HIGH (ref 8–23)
CO2: 24 mmol/L (ref 22–32)
Calcium: 8.9 mg/dL (ref 8.9–10.3)
Chloride: 111 mmol/L (ref 98–111)
Creatinine, Ser: 1.12 mg/dL — ABNORMAL HIGH (ref 0.44–1.00)
GFR calc Af Amer: 56 mL/min — ABNORMAL LOW (ref >60)
GFR calc non Af Amer: 49 mL/min — ABNORMAL LOW (ref >60)
Glucose, Bld: 162 mg/dL — ABNORMAL HIGH (ref 70–99)
Potassium: 4.2 mmol/L (ref 3.5–5.1)
Sodium: 143 mmol/L (ref 135–145)

## 2019-04-20 LAB — GLUCOSE, CAPILLARY
Glucose-Capillary: 182 mg/dL — ABNORMAL HIGH (ref 70–99)
Glucose-Capillary: 141 mg/dL — ABNORMAL HIGH (ref 70–99)
Glucose-Capillary: 80 mg/dL (ref 70–99)
Glucose-Capillary: 197 mg/dL — ABNORMAL HIGH (ref 70–99)
Glucose-Capillary: 299 mg/dL — ABNORMAL HIGH (ref 70–99)
Glucose-Capillary: 190 mg/dL — ABNORMAL HIGH (ref 70–99)

## 2019-04-20 LAB — D-DIMER, QUANTITATIVE
D-Dimer, Quant: 2.2 ug/mL-FEU — ABNORMAL HIGH (ref 0.00–0.50)
D-Dimer, Quant: 1.98 ug/mL-FEU — ABNORMAL HIGH (ref 0.00–0.50)

## 2019-04-20 LAB — C-REACTIVE PROTEIN: CRP: 3.2 mg/dL — ABNORMAL HIGH (ref <1.0)

## 2019-04-20 NOTE — Plan of Care (Signed)
  Problem: Education: Goal: Knowledge of risk factors and measures for prevention of condition will improve Outcome: Progressing   Problem: Coping: Goal: Psychosocial and spiritual needs will be supported Outcome: Progressing   Problem: Respiratory: Goal: Will maintain a patent airway Outcome: Progressing   Problem: Education: Goal: Knowledge of General Education information will improve Description: Including pain rating scale, medication(s)/side effects and non-pharmacologic comfort measures Outcome: Progressing   Problem: Health Behavior/Discharge Planning: Goal: Ability to manage health-related needs will improve Outcome: Progressing   Problem: Clinical Measurements: Goal: Ability to maintain clinical measurements within normal limits will improve Outcome: Progressing   Problem: Clinical Measurements: Goal: Respiratory complications will improve Outcome: Progressing   Problem: Clinical Measurements: Goal: Will remain free from infection Outcome: Progressing

## 2019-04-20 NOTE — Progress Notes (Signed)
Physical Therapy Treatment Patient Details Name: Jade Sullivan MRN: [id number] DOB: [birth year] Today's Date: 04/20/2019    History of Present Illness 74 yo female admitted with COVID Pna, new onset DM. Hx of CKD, gout, obesity    PT Comments    Progressing with mobility. O2 85% on 6L [place] O2 with ambulation.    Follow Up Recommendations  Home health PT     Equipment Recommendations  Rolling walker with 5" wheels    Recommendations for Other Services       Precautions / Restrictions Precautions Precautions: Fall Precaution Comments: monitor O2 sats. Pt currently on 6L [place] Restrictions Weight Bearing Restrictions: No    Mobility  Bed Mobility               General bed mobility comments: oob in recliner  Transfers Overall transfer level: Needs assistance Equipment used: Rolling walker (2 wheeled) Transfers: Sit to/from Stand Sit to Stand: Supervision         General transfer comment: for safety  Ambulation/Gait Ambulation/Gait assistance: Min guard Gait Distance (Feet): 50 Feet Assistive device: Rolling walker (2 wheeled) Gait Pattern/deviations: Step-through pattern;Decreased stride length     General Gait Details: Close guard for safety. O2 85% on 6L [place] after ambulation.   Stairs             Wheelchair Mobility    Modified Rankin (Stroke Patients Only)       Balance Overall balance assessment: Needs assistance         Standing balance support: Bilateral upper extremity supported Standing balance-Leahy Scale: Fair                              Cognition Arousal/Alertness: Awake/alert Behavior During Therapy: WFL for tasks assessed/performed Overall Cognitive Status: Within Functional Limits for tasks assessed                                        Exercises      General Comments        Pertinent Vitals/Pain Pain Assessment: No/denies pain    Home Living                      Prior  Function            PT Goals (current goals can now be found in the care plan section) Progress towards PT goals: Progressing toward goals    Frequency    Min 3X/week      PT Plan Current plan remains appropriate    Co-evaluation              AM-PAC PT "6 Clicks" Mobility   Outcome Measure  Help needed turning from your back to your side while in a flat bed without using bedrails?: A Little Help needed moving from lying on your back to sitting on the side of a flat bed without using bedrails?: A Little Help needed moving to and from a bed to a chair (including a wheelchair)?: A Little Help needed standing up from a chair using your arms (e.g., wheelchair or bedside chair)?: A Little Help needed to walk in hospital room?: A Little Help needed climbing 3-5 steps with a railing? : A Little 6 Click Score: 18    End of Session Equipment Utilized During Treatment: Oxygen Activity Tolerance: Patient tolerated  treatment well Patient left: in chair;with call bell/phone within reach   PT Visit Diagnosis: Unsteadiness on feet (R26.81);Muscle weakness (generalized) (M62.81);Difficulty in walking, not elsewhere classified (R26.2)     Time: [id number] PT Time Calculation (min) (ACUTE ONLY): 13 min  Charges:  $Gait Training: 8-22 mins                        Doreatha Massed, PT Acute Rehabilitation

## 2019-04-20 NOTE — Progress Notes (Signed)
Pt transported to [facility] via [facility]. Pt stable. Report was called to RN receiving pt .

## 2019-04-20 NOTE — Progress Notes (Signed)
[facility] PROGRESS NOTE    Progress Note  [name]  [id number] DOB: February 15, 1946 DOA: 04/13/2019 PCP: Hoyt Koch, MD     Brief Narrative:   Jade Sullivan is an 74 y.o. female past medical history of COVID-19 infection on April 05, 2019, chronic disease stage III, hypothyroidism hypertension, came back to the emergency room when she was not able to tolerate her oral, was found to be satting greater than 95% hydrated and discharged back home came back on the day of admission to be hypoxic requiring 4 L of oxygen to keep saturations greater than 93%, tidal infiltrates.  Assessment/Plan:   Acte respiratory failure with hypoxia secondarily to Pneumonia due to COVID-19 virus Still remains on 6 L of oxygen try to keep saturations greater 96%. She has completed her course of IV remdesivir in house, will continue IV steroids for total or less than 10 days.  She did receive convalescent plasma and IV Actemra on 04/14/2019.  Her inflammatory markers have significantly improved. She has not been compliant with her proning, physical therapy evaluated the patient and recommended either skilled nursing facility versus 24-hour supervision at home.  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  New mild hypovolemic hypernatremia: Her BNP was elevated, her chest x-ray showed small pleural effusion, 2D echo was done that showed grade 2 diastolic heart failure with preserved ejection fraction. She was started on IV diuresis and now hypernatremia has resolved.  She was changed to oral Lasix which she will continue as an outpatient.  Her creatinine has remained stable.  Chronic diastolic heart failure: 2D echo done on 04/17/2019 that showed a preserved EF with a grade 2 diastolic heart failure, she was started on ACE  inhibitor Aldactone and continue on her beta-blocker, on 04/18/2019 she restarted on low-dose diuretic and her blood pressure as well as her hyponatremia has remained stable.  Hyperlipidemia: Continue current home regimen.  Obesity, unspecified: Counseling.  New onset diabetes mellitus: With an A1c of 9.8, currently on long-acting insulin plus resistant sliding scale due to steroids, blood glucose fairly controlled.  DVT prophylaxis: Lovenox Family Communication:none Disposition Plan/Barrier to D/C: We will transfer her to the great care of the doctors at [facility] Code Status:     Code Status Orders  (From admission, onward)         Start     Ordered   04/13/19 2310  Full code  Continuous     04/13/19 2309        Code Status History    This patient has a current code status but no historical code status.   Advance Care Planning Activity        IV Access:    Peripheral IV   Procedures and diagnostic studies:   CT CHEST WO CONTRAST  Result Date: 04/19/2019 CLINICAL DATA:  Shortness of breath on exertion. History of COVID-19 infection. EXAM: CT CHEST WITHOUT CONTRAST TECHNIQUE: Multidetector CT imaging of the chest was performed following the standard protocol without IV contrast. COMPARISON:  Chest radiograph 04/18/2019. 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 Atherosclerotic calcification of the aorta. Marked enlargement of the pulmonic trunk. Heart is enlarged. Small pericardial effusion. Mediastinum/Nodes: Mediastinal lymph nodes measure up to 11 mm in the low right paratracheal station and are likely reactive. Hilar regions are difficult to evaluate without IV  contrast. No axillary adenopathy. Fluid arising from the right shoulder joint simulates an enlarged lymph node (2/8). Lungs/Pleura: Diffuse patchy bilateral peribronchovascular ground-glass. Image quality in the lung bases is compromised by respiratory motion.  Consolidation in the lingula. No pleural fluid. Airway is unremarkable. Upper Abdomen: Liver margin is irregular. Low-attenuation lesions in the liver measure up to 12 mm and are difficult to further characterize due to size. Liver and gallbladder are otherwise unremarkable. Nodular thickening of the lateral limb right adrenal gland. Left adrenal gland is unremarkable. Low-attenuation lesions in the kidneys measure up to 7.3 cm on the right and are likely cysts although some are incompletely imaged and/or too small to characterize. Visualized portions of the spleen, pancreas, stomach and bowel are unremarkable with the exception of a small hiatal hernia. No upper abdominal adenopathy. Musculoskeletal: Degenerative changes in the spine. No worrisome lytic or sclerotic lesions. IMPRESSION: 1. Moderate COVID-19 pneumonia. 2. Small pericardial effusion. 3. Liver appears cirrhotic. 4.  Aortic atherosclerosis (ICD10-I70.0). 5. Marked enlargement of the pulmonic trunk, indicative of pulmonary arterial hypertension. Electronically Signed   By: Lorin Picket M.D.   On: 04/19/2019 16:30   DG CHEST PORT 1 VIEW  Result Date: 04/18/2019 CLINICAL DATA:  COVID-19 infection. EXAM: PORTABLE CHEST 1 VIEW COMPARISON:  April 17, 2019. FINDINGS: Stable cardiomegaly. No pneumothorax or pleural effusion is noted. Stable bilateral ill-defined lung opacities are noted consistent with multifocal pneumonia. Bony thorax is unremarkable. IMPRESSION: Stable bilateral lung opacities are noted consistent with multifocal pneumonia. Electronically Signed   By: Marijo Conception M.D.   On: 04/18/2019 14:23     Medical Consultants:    None.  Anti-Infectives:   None  Subjective:    [name] she relates her breathing is about the same as yesterday, has not improve, she relates she has regained her appetite. Objective:    Vitals:   04/19/19 0534 04/19/19 1312 04/19/19 2149 04/20/19 0500  BP: (!) 147/90 (!) 104/57 (!)  140/98 123/72  Pulse: (!) 51 61 66 (!) 57  Resp: 20 19 19 19   Temp: 97.9 F (36.6 C) 97.7 F (36.5 C) 98.2 F (36.8 C) 98.3 F (36.8 C)  TempSrc: Oral Oral Oral Oral  SpO2: 96% 99% 96% 98%  Weight:      Height:       SpO2: 98 % O2 Flow Rate (L/min): 6 L/min   Intake/Output Summary (Last 24 hours) at 04/20/2019 0806 Last data filed at 04/19/2019 1327 Gross per 24 hour  Intake 240 ml  Output -  Net 240 ml   Filed Weights   04/13/19 1938  Weight: 95.3 kg    Exam: General exam: In no acute distress. Respiratory system: Good air movement and diffuse crackles bilaterally Cardiovascular system: S1 & S2 heard, RRR. No JVD. Gastrointestinal system: Abdomen is nondistended, soft and nontender.  Central nervous system: Alert and oriented.  Extremities: No pedal edema. Skin: No rashes, lesions or ulcers  Data Reviewed:    Labs: Basic Metabolic Panel: Recent Labs  Lab 04/14/19 0407 04/14/19 0407 04/15/19 0216 04/15/19 0216 04/16/19 0145 04/16/19 0145 04/17/19 0207 04/17/19 0207 04/18/19 [id number] 04/18/19 0233 04/19/19 0242 04/20/19 0212  NA 143   < > 145   < > 147*  --  148*  --  145  --  142 143  K 4.1   < > 4.2   < > 4.3   < > 3.9   < > 4.7   < > 4.4 4.2  CL  112*   < > 115*   < > 118*  --  118*  --  115*  --  112* 111  CO2 21*   < > 21*   < > 20*  --  19*  --  20*  --  21* 24  GLUCOSE 262*   < > 218*   < > 131*  --  115*  --  224*  --  133* 162*  BUN 30*   < > 35*   < > 38*  --  33*  --  36*  --  33* 32*  CREATININE 1.64*   < > 1.31*   < > 1.27*  --  1.13*  --  1.21*  --  1.13* 1.12*  CALCIUM 8.4*   < > 8.4*   < > 8.4*  --  8.3*  --  8.8*  --  8.9 8.9  MG 2.0  --  2.2  --  2.4  --  2.2  --  2.3  --   --   --   PHOS 2.3*  --  1.9*  --  2.2*  --  2.8  --  3.1  --   --   --    < > = values in this interval not displayed.   GFR Estimated Creatinine Clearance: 51.1 mL/min (A) (by C-G formula based on SCr of 1.12 mg/dL (H)). Liver Function Tests: Recent Labs  Lab  04/14/19 0407 04/15/19 0216 04/16/19 0145 04/17/19 0207 04/18/19 0233  AST 48* 48* 52* 44* 38  ALT 21 23 22 24 26   ALKPHOS 66 71 77 71 75  BILITOT 0.5 0.7 0.6 0.8 0.8  PROT 7.0 7.3 7.0 6.7 6.7  ALBUMIN 3.0* 3.1* 3.0* 2.8* 3.0*   No results for input(s): LIPASE, AMYLASE in the last 168 hours. No results for input(s): AMMONIA in the last 168 hours. Coagulation profile No results for input(s): INR, PROTIME in the last 168 hours. COVID-19 Labs  Recent Labs    04/18/19 0233 04/19/19 0901 04/20/19 0212  DDIMER 2.25* 2.42*  --   FERRITIN 529*  --   --   CRP 7.0* 4.7* 3.2*    No results found for: SARSCOV2NAA  CBC: Recent Labs  Lab 04/14/19 0407 04/15/19 0216 04/16/19 0145 04/17/19 0207 04/18/19 0233  WBC 7.4 8.6 10.7* 11.0* 11.7*  NEUTROABS 6.0 6.9 9.1* 9.2* 10.0*  HGB 12.3 12.4 12.3 12.9 13.7  HCT 37.7 38.4 38.6 39.4 41.8  MCV 92.6 92.8 94.4 93.4 93.3  PLT 180 227 240 241 268   Cardiac Enzymes: No results for input(s): CKTOTAL, CKMB, CKMBINDEX, TROPONINI in the last 168 hours. BNP (last 3 results) No results for input(s): PROBNP in the last 8760 hours. CBG: Recent Labs  Lab 04/19/19 1702 04/19/19 2151 04/20/19 0059 04/20/19 0410 04/20/19 0747  GLUCAP 292* 266* 182* 141* 80   D-Dimer: Recent Labs    04/18/19 0233 04/19/19 0901  DDIMER 2.25* 2.42*   Hgb A1c: No results for input(s): HGBA1C in the last 72 hours. Lipid Profile: No results for input(s): CHOL, HDL, LDLCALC, TRIG, CHOLHDL, LDLDIRECT in the last 72 hours. Thyroid function studies: No results for input(s): TSH, T4TOTAL, T3FREE, THYROIDAB in the last 72 hours.  Invalid input(s): FREET3 Anemia work up: Recent Labs    04/18/19 [place]*   Sepsis Labs: Recent Labs  Lab 04/13/19 1953 04/13/19 2324 04/14/19 0407 04/15/19 0216 04/16/19 0145 04/16/19 0845 04/17/19 0207 04/18/19 0233  PROCALCITON 0.24  --   --   --   --  <  0.10 <0.10 <0.10  WBC 6.8  --    < > 8.6 10.7*  --   11.0* 11.7*  LATICACIDVEN 2.8* 1.5  --   --   --   --   --   --    < > = values in this interval not displayed.   Microbiology Recent Results (from the past 240 hour(s))  Blood Culture (routine x 2)     Status: None   Collection Time: 04/13/19  7:50 PM   Specimen: BLOOD  Result Value Ref Range Status   Specimen Description   Final    BLOOD LEFT ANTECUBITAL Performed at [facility] [address], [place], [place] [address]    Special Requests   Final    BOTTLES DRAWN AEROBIC ONLY Blood Culture results may not be optimal due to an excessive volume of blood received in culture bottles Performed at [facility] [address] [place], [place] [address]    Culture   Final    NO GROWTH 5 DAYS Performed at [facility], [place] [address], [place], [place] [address]    Report Status 04/19/2019 FINAL  Final  Blood Culture (routine x 2)     Status: None   Collection Time: 04/13/19  7:53 PM   Specimen: BLOOD  Result Value Ref Range Status   Specimen Description   Final    BLOOD BLOOD LEFT FOREARM Performed at [facility] [address], [place], [place] [address]    Special Requests   Final    BOTTLES DRAWN AEROBIC AND ANAEROBIC Blood Culture adequate volume Performed at [facility] [address] [place], [place] [address]    Culture   Final    NO GROWTH 5 DAYS Performed at [facility], [place] [address], [place], [place] [address]    Report Status 04/19/2019 FINAL  Final     Medications:   . atorvastatin  20 mg Oral Daily  . cloNIDine  0.3 mg Transdermal Weekly  . cloNIDine  0.2 mg Oral BID  . enoxaparin (LOVENOX) injection  40 mg Subcutaneous Q24H  . furosemide  20 mg Oral Daily  . insulin aspart  0-15 Units Subcutaneous TID WC  . insulin aspart  0-5 Units Subcutaneous QHS  . insulin aspart  6 Units Subcutaneous TID WC  . insulin detemir  25 Units Subcutaneous BID  . latanoprost   1 drop Both Eyes QHS  . levothyroxine  50 mcg Oral Q0600  . losartan  100 mg Oral Daily  . mouth rinse  15 mL Mouth Rinse BID  . methylPREDNISolone (SOLU-MEDROL) injection  30 mg Intravenous Q12H  . minoxidil  2.5 mg Oral BID  . nebivolol  5 mg Oral Daily  . sodium chloride flush  3 mL Intravenous Q12H  . spironolactone  100 mg Oral Daily  . timolol  1 drop Both Eyes BID   Continuous Infusions:    LOS: 7 days   Charlynne Cousins  [facility]  04/20/2019, 8:06 AM

## 2019-04-21 DIAGNOSIS — N1831 Chronic kidney disease, stage 3a: Secondary | ICD-10-CM

## 2019-04-21 LAB — COMPREHENSIVE METABOLIC PANEL
ALT: 28 U/L (ref 0–44)
AST: 25 U/L (ref 15–41)
Albumin: 3 g/dL — ABNORMAL LOW (ref 3.5–5.0)
Alkaline Phosphatase: 63 U/L (ref 38–126)
Anion gap: 9 (ref 5–15)
BUN: 37 mg/dL — ABNORMAL HIGH (ref 8–23)
CO2: 27 mmol/L (ref 22–32)
Calcium: 9.1 mg/dL (ref 8.9–10.3)
Chloride: 106 mmol/L (ref 98–111)
Creatinine, Ser: 1.06 mg/dL — ABNORMAL HIGH (ref 0.44–1.00)
GFR calc Af Amer: >60 mL/min (ref >60)
GFR calc non Af Amer: 52 mL/min — ABNORMAL LOW (ref >60)
Glucose, Bld: 129 mg/dL — ABNORMAL HIGH (ref 70–99)
Potassium: 3.9 mmol/L (ref 3.5–5.1)
Sodium: 142 mmol/L (ref 135–145)
Total Bilirubin: 1.2 mg/dL (ref 0.3–1.2)
Total Protein: 6.6 g/dL (ref 6.5–8.1)

## 2019-04-21 LAB — BRAIN NATRIURETIC PEPTIDE: B Natriuretic Peptide: 89.6 pg/mL (ref 0.0–100.0)

## 2019-04-21 LAB — CBC
HCT: 42 % (ref 36.0–46.0)
Hemoglobin: 13.9 g/dL (ref 12.0–15.0)
MCH: 30.8 pg (ref 26.0–34.0)
MCHC: 33.1 g/dL (ref 30.0–36.0)
MCV: 92.9 fL (ref 80.0–100.0)
Platelets: 248 10*3/uL (ref 150–400)
RBC: 4.52 MIL/uL (ref 3.87–5.11)
RDW: 13.9 % (ref 11.5–15.5)
WBC: 14 10*3/uL — ABNORMAL HIGH (ref 4.0–10.5)
nRBC: 0 % (ref 0.0–0.2)

## 2019-04-21 LAB — C-REACTIVE PROTEIN
CRP: 2 mg/dL — ABNORMAL HIGH (ref <1.0)
CRP: 1.9 mg/dL — ABNORMAL HIGH (ref <1.0)

## 2019-04-21 LAB — D-DIMER, QUANTITATIVE
D-Dimer, Quant: 1.69 ug/mL-FEU — ABNORMAL HIGH (ref 0.00–0.50)
D-Dimer, Quant: 1.77 ug/mL-FEU — ABNORMAL HIGH (ref 0.00–0.50)

## 2019-04-21 LAB — GLUCOSE, CAPILLARY
Glucose-Capillary: 104 mg/dL — ABNORMAL HIGH (ref 70–99)
Glucose-Capillary: 180 mg/dL — ABNORMAL HIGH (ref 70–99)
Glucose-Capillary: 389 mg/dL — ABNORMAL HIGH (ref 70–99)
Glucose-Capillary: 292 mg/dL — ABNORMAL HIGH (ref 70–99)

## 2019-04-21 MED ORDER — NEBIVOLOL HCL 2.5 MG PO TABS
2.5000 mg | ORAL_TABLET | Freq: Every day | ORAL | Status: DC
  Administered 2019-04-22: 2.5 mg via ORAL
  Filled 2019-04-21: qty 1

## 2019-04-21 MED ORDER — LIVING WELL WITH DIABETES BOOK
Freq: Once | Status: AC
  Filled 2019-04-21: qty 1

## 2019-04-21 MED ORDER — INSULIN STARTER KIT- PEN NEEDLES (ENGLISH)
1.0000 | Freq: Once | Status: AC
  Administered 2019-04-21: 1
  Filled 2019-04-21: qty 1

## 2019-04-21 MED ORDER — LOSARTAN POTASSIUM 25 MG PO TABS
50.0000 mg | ORAL_TABLET | Freq: Every day | ORAL | Status: DC
  Administered 2019-04-22: 50 mg via ORAL
  Filled 2019-04-21: qty 2

## 2019-04-21 NOTE — Progress Notes (Signed)
PROGRESS NOTE                                                                                                                                                                                                             Patient Demographics:    Jade Sullivan, is a 74 y.o. female, DOB - 15-Apr-1945, [id number]  Outpatient Primary MD for the patient is Hoyt Koch, MD   Admit date - 04/13/2019   LOS - 8  Chief Complaint  Patient presents with  . COVID positive  . Shortness of Breath       Brief Narrative: Patient is a 74 y.o. female with PMHx of CKD stage III, hypothyroidism, HTN-diagnosed with COVID-19 on 1/4-presented to the ED with acute hypoxic respiratory failure secondary to COVID-19 pneumonia.   Subjective:    Jade Sullivan today does not have any complaints-she was lying comfortably in bed.  Denies any chest pain.   Assessment  & Plan :   Acute Hypoxic Resp Failure due to Covid 19 Viral pneumonia: Improved-and down to 2 L of oxygen this morning-continue steroids.  Has completed a course of remdesivir.  Assess for home O2 requirement prior to discharge  Fever: afebrile  O2 requirements:  SpO2: 97 % O2 Flow Rate (L/min): 2 L/min   COVID-19 Labs: Recent Labs    04/19/19 0901 04/20/19 0212 04/20/19 0945 04/20/19 1400 04/21/19 1000  DDIMER 2.42*  --  2.20* 1.98*  --   CRP 4.7* 3.2*  --   --  1.9*       Component Value Date/Time   BNP 89.6 04/21/2019 1030    Recent Labs  Lab 04/16/19 0845 04/17/19 0207 04/18/19 0233  PROCALCITON <0.10 <0.10 <0.10    No results found for: SARSCOV2NAA   COVID-19 Medications: Steroids: 1/12>> Remdesivir: 1/12>> 1/16 Actemra: 1/13 x 1  Prone/Incentive Spirometry: encouraged  incentive spirometry use 3-4/hour.  DVT Prophylaxis  :  Lovenox   AKI on CKD stage IIIa: AKI resolved-creatinine close to usual baseline.  Hypernatremia:  Resolved.  Chronic diastolic heart failure: Volume status stable-continue diuretics.  Dyslipidemia: Continue statin  HTN: BP well controlled-hold antihypertensives at home-stop oral clonidine-decrease losartan to 50 mg, Bystolic to 2.5 mg-continue minoxidil.  Follow and adjust.     DM-2 (A1c 9.8): CBGs stable-continue Levemir 25 units twice daily, 6 units of NovoLog  with meals and SSI.  Follow and adjust.  CBG (last 3)  Recent Labs    04/20/19 2118 04/21/19 0759 04/21/19 1245  GLUCAP 190* 104* 180*   Hypothyroidism: Continue Synthroid  Obesity: Estimated body mass index is 34.95 kg/m as calculated from the following:   Height as of this encounter: 5\' 5"  (1.651 m).   Weight as of this encounter: 95.3 kg.   Consults  :  None  Procedures  :  None  ABG: No results found for: PHART, PCO2ART, PO2ART, HCO3, TCO2, ACIDBASEDEF, O2SAT  Vent Settings: N/A  Condition - stable  Family Communication  :  Son updated over the phone  Code Status :  Full Code  Diet :  Diet Order            Diet regular Room service appropriate? Yes; Fluid consistency: Thin; Fluid restriction: 1200 mL Fluid  Diet effective now               Disposition Plan  :  Remain hospitalized-Home with home health in the next few days-depending on clinical improvement.  Barriers to discharge: Hypoxia requiring O2 supplementation/complete 5 days of IV Remdesivir  Antimicorbials  :    Anti-infectives (From admission, onward)   Start     Dose/Rate Route Frequency Ordered Stop   04/14/19 1000  remdesivir 100 mg in sodium chloride 0.9 % 100 mL IVPB     100 mg 200 mL/hr over 30 Minutes Intravenous Daily 04/13/19 2301 04/17/19 1043   04/13/19 2301  remdesivir 200 mg in sodium chloride 0.9% 250 mL IVPB     200 mg 580 mL/hr over 30 Minutes Intravenous Once 04/13/19 2301 04/14/19 0047      Inpatient Medications  Scheduled Meds: . atorvastatin  20 mg Oral Daily  . cloNIDine  0.3 mg Transdermal Weekly   . enoxaparin (LOVENOX) injection  40 mg Subcutaneous Q24H  . furosemide  20 mg Oral Daily  . insulin aspart  0-15 Units Subcutaneous TID WC  . insulin aspart  0-5 Units Subcutaneous QHS  . insulin aspart  6 Units Subcutaneous TID WC  . insulin detemir  25 Units Subcutaneous BID  . latanoprost  1 drop Both Eyes QHS  . levothyroxine  50 mcg Oral Q0600  . [START ON 04/22/2019] losartan  50 mg Oral Daily  . mouth rinse  15 mL Mouth Rinse BID  . methylPREDNISolone (SOLU-MEDROL) injection  30 mg Intravenous Q12H  . minoxidil  2.5 mg Oral BID  . nebivolol  5 mg Oral Daily  . sodium chloride flush  3 mL Intravenous Q12H  . spironolactone  100 mg Oral Daily  . timolol  1 drop Both Eyes BID   Continuous Infusions: PRN Meds:.acetaminophen, albuterol, guaiFENesin-dextromethorphan, HYDROcodone-acetaminophen, ondansetron **OR** ondansetron (ZOFRAN) IV   Time Spent in minutes  25  See all Orders from today for further details   Oren Binet M.D on 04/21/2019 at 3:05 PM  To page go to [email or web address] - use universal password  [facility] -  Office  [phone]    Objective:   Vitals:   04/20/19 2031 04/20/19 2354 04/21/19 0800 04/21/19 1202  BP: 125/70 117/70 109/65 121/64  Pulse: 67 66 71 62  Resp: 19  16 16   Temp: 98.6 F (37 C) 98.6 F (37 C) 98.3 F (36.8 C)   TempSrc: Oral Oral Oral   SpO2: 97% 94% 97%   Weight:      Height:        Wt Readings from  Last 3 Encounters:  04/13/19 95.3 kg  04/12/19 95.3 kg  05/12/18 94.8 kg     Intake/Output Summary (Last 24 hours) at 04/21/2019 1505 Last data filed at 04/21/2019 1200 Gross per 24 hour  Intake 720 ml  Output --  Net 720 ml     Physical Exam Gen Exam:Alert awake-not in any distress HEENT:atraumatic, normocephalic Chest: B/L clear to auscultation anteriorly CVS:S1S2 regular Abdomen:soft non tender, non distended Extremities:no edema Neurology: Non focal Skin: no rash   Data Review:    CBC Recent  Labs  Lab 04/15/19 0216 04/16/19 0145 04/17/19 0207 04/18/19 0233 04/21/19 1000  WBC 8.6 10.7* 11.0* 11.7* 14.0*  HGB 12.4 12.3 12.9 13.7 13.9  HCT 38.4 38.6 39.4 41.8 42.0  PLT 227 240 241 268 248  MCV 92.8 94.4 93.4 93.3 92.9  MCH 30.0 30.1 30.6 30.6 30.8  MCHC 32.3 31.9 32.7 32.8 33.1  RDW 14.3 14.5 14.4 14.4 13.9  LYMPHSABS 1.3 1.0 1.0 0.9  --   MONOABS 0.4 0.4 0.5 0.5  --   EOSABS 0.0 0.0 0.0 0.0  --   BASOSABS 0.0 0.0 0.0 0.1  --     Chemistries  Recent Labs  Lab 04/15/19 0216 04/15/19 0216 04/16/19 0145 04/16/19 0145 04/17/19 0207 04/18/19 0233 04/19/19 0242 04/20/19 0212 04/21/19 1000  NA 145   < > 147*   < > 148* 145 142 143 142  K 4.2   < > 4.3   < > 3.9 4.7 4.4 4.2 3.9  CL 115*   < > 118*   < > 118* 115* 112* 111 106  CO2 21*   < > 20*   < > 19* 20* 21* 24 27  GLUCOSE 218*   < > 131*   < > 115* 224* 133* 162* 129*  BUN 35*   < > 38*   < > 33* 36* 33* 32* 37*  CREATININE 1.31*   < > 1.27*   < > 1.13* 1.21* 1.13* 1.12* 1.06*  CALCIUM 8.4*   < > 8.4*   < > 8.3* 8.8* 8.9 8.9 9.1  MG 2.2  --  2.4  --  2.2 2.3  --   --   --   AST 48*  --  52*  --  44* 38  --   --  25  ALT 23  --  22  --  24 26  --   --  28  ALKPHOS 71  --  77  --  71 75  --   --  63  BILITOT 0.7  --  0.6  --  0.8 0.8  --   --  1.2   < > = values in this interval not displayed.   ------------------------------------------------------------------------------------------------------------------ No results for input(s): CHOL, HDL, LDLCALC, TRIG, CHOLHDL, LDLDIRECT in the last 72 hours.  Lab Results  Component Value Date   HGBA1C 9.8 (H) 04/14/2019   ------------------------------------------------------------------------------------------------------------------ No results for input(s): TSH, T4TOTAL, T3FREE, THYROIDAB in the last 72 hours.  Invalid input(s): FREET3 ------------------------------------------------------------------------------------------------------------------ No results  for input(s): VITAMINB12, FOLATE, FERRITIN, TIBC, IRON, RETICCTPCT in the last 72 hours.  Coagulation profile No results for input(s): INR, PROTIME in the last 168 hours.  Recent Labs    04/20/19 0945 04/20/19 1400  DDIMER 2.20* 1.98*    Cardiac Enzymes No results for input(s): CKMB, TROPONINI, MYOGLOBIN in the last 168 hours.  Invalid input(s): CK ------------------------------------------------------------------------------------------------------------------    Component Value Date/Time   BNP 89.6 04/21/2019 1030  Micro Results Recent Results (from the past 240 hour(s))  Blood Culture (routine x 2)     Status: None   Collection Time: 04/13/19  7:50 PM   Specimen: BLOOD  Result Value Ref Range Status   Specimen Description   Final    BLOOD LEFT ANTECUBITAL Performed at [facility] [address], [place], [place] [address]    Special Requests   Final    BOTTLES DRAWN AEROBIC ONLY Blood Culture results may not be optimal due to an excessive volume of blood received in culture bottles Performed at [facility] [address], [place], [place] [address]    Culture   Final    NO GROWTH 5 DAYS Performed at [facility], [place] [address], [place], [place] [address]    Report Status 04/19/2019 FINAL  Final  Blood Culture (routine x 2)     Status: None   Collection Time: 04/13/19  7:53 PM   Specimen: BLOOD  Result Value Ref Range Status   Specimen Description   Final    BLOOD BLOOD LEFT FOREARM Performed at [facility] [address], [place], [place] [address]    Special Requests   Final    BOTTLES DRAWN AEROBIC AND ANAEROBIC Blood Culture adequate volume Performed at [facility] [address], [place], [place] [address]    Culture   Final    NO GROWTH 5 DAYS Performed at [facility], [place] [address], [place],  [address]    Report Status 04/19/2019 FINAL  Final     Radiology Reports CT CHEST WO CONTRAST  Result Date: 04/19/2019 CLINICAL DATA:  Shortness of breath on exertion. History of COVID-19 infection. EXAM: CT CHEST WITHOUT CONTRAST TECHNIQUE: Multidetector CT imaging of the chest was performed following the standard protocol without IV contrast. COMPARISON:  Chest radiograph 04/18/2019. 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 Atherosclerotic calcification of the aorta. Marked enlargement of the pulmonic trunk. Heart is enlarged. Small pericardial effusion. Mediastinum/Nodes: Mediastinal lymph nodes measure up to 11 mm in the low right paratracheal station and are likely reactive. Hilar regions are difficult to evaluate without IV contrast. No axillary adenopathy. Fluid arising from the right shoulder joint simulates an enlarged lymph node (2/8). Lungs/Pleura: Diffuse patchy bilateral peribronchovascular ground-glass. Image quality in the lung bases is compromised by respiratory motion. Consolidation in the lingula. No pleural fluid. Airway is unremarkable. Upper Abdomen: Liver margin is irregular. Low-attenuation lesions in the liver measure up to 12 mm and are difficult to further characterize due to size. Liver and gallbladder are otherwise unremarkable. Nodular thickening of the lateral limb right adrenal gland. Left adrenal gland is unremarkable. Low-attenuation lesions in the kidneys measure up to 7.3 cm on the right and are likely cysts although some are incompletely imaged and/or too small to characterize. Visualized portions of the spleen, pancreas, stomach and bowel are unremarkable with the exception of a small hiatal hernia. No upper abdominal adenopathy. Musculoskeletal: Degenerative changes in the spine. No worrisome lytic or sclerotic lesions. IMPRESSION: 1. Moderate COVID-19 pneumonia. 2. Small pericardial effusion. 3. Liver appears cirrhotic. 4.  Aortic atherosclerosis (ICD10-I70.0). 5. Marked enlargement  of the pulmonic trunk, indicative of pulmonary arterial hypertension. Electronically Signed   By: Lorin Picket M.D.   On: 04/19/2019 16:30   DG CHEST PORT 1 VIEW  Result Date: 04/18/2019 CLINICAL DATA:  COVID-19 infection. EXAM: PORTABLE CHEST 1 VIEW COMPARISON:  April 17, 2019.  FINDINGS: Stable cardiomegaly. No pneumothorax or pleural effusion is noted. Stable bilateral ill-defined lung opacities are noted consistent with multifocal pneumonia. Bony thorax is unremarkable. IMPRESSION: Stable bilateral lung opacities are noted consistent with multifocal pneumonia. Electronically Signed   By: Marijo Conception M.D.   On: 04/18/2019 14:23   DG CHEST PORT 1 VIEW  Result Date: 04/17/2019 CLINICAL DATA:  Shortness of breath. COVID positive. EXAM: PORTABLE CHEST 1 VIEW COMPARISON:  Radiograph earlier this day at 100 hour FINDINGS: Cardiomegaly is unchanged. Diffuse bilateral heterogeneous pulmonary opacities, confluent in the left lower lobe, not significantly changed. Possible left pleural effusion. No pneumothorax. IMPRESSION: No significant change in diffuse bilateral heterogeneous pulmonary opacities, confluent in the left lower lobe. Possible left pleural effusion. Stable cardiomegaly. Electronically Signed   By: Keith Rake M.D.   On: 04/17/2019 00:19   DG CHEST PORT 1 VIEW  Result Date: 04/16/2019 CLINICAL DATA:  COVID-19 infection. EXAM: PORTABLE CHEST 1 VIEW COMPARISON:  Chest x-ray 04/13/2019. FINDINGS: Stable cardiomegaly. Progressive severe diffuse bilateral pulmonary infiltrates/edema. Low lung volumes. Left pleural effusion cannot be excluded. No pneumothorax. IMPRESSION: 1.  Stable cardiomegaly. 2. Progressive severe diffuse bilateral pulmonary infiltrates/edema. Low lung volumes. Left pleural effusion cannot be excluded. Electronically Signed   By: [name]  Register   On: 04/16/2019 09:14   DG Chest Port 1 View  Result Date: 04/13/2019 CLINICAL DATA:  Shortness of breath, diagnosed  with COVID-19 04/05/2019 EXAM: PORTABLE CHEST 1 VIEW COMPARISON:  Radiograph 04/12/2019 FINDINGS: Increasingly confluent airspace disease in the right lung and retrocardiac space with some developing air bronchograms. No pneumothorax. No visible effusion. Prominence of the cardiac silhouette may be related to combination of low volumes and portable technique. Stable tracheal tortuosity. No acute osseous or soft tissue abnormality. Degenerative changes are present in the imaged spine and shoulders. IMPRESSION: Increasingly confluent airspace disease in the right lung and retrocardiac space with some developing air bronchograms. Given the rapid development, findings are suspicious for pneumonia. Electronically Signed   By: Lovena Le M.D.   On: 04/13/2019 21:36   DG Chest Portable 1 View  Result Date: 04/12/2019 CLINICAL DATA:  Cough. COVID-19. Shortness of breath. EXAM: PORTABLE CHEST 1 VIEW COMPARISON:  None. FINDINGS: There are 3 patchy small hazy infiltrates in the right lung. There is increased density at the left lung base which may represent infiltrate. Heart size and vascularity are normal. Aortic atherosclerosis. No acute bone abnormality. IMPRESSION: 1. Three small patchy infiltrates in the right lung. 2. Increased density at the left lung base which may represent infiltrate. 3.  Aortic Atherosclerosis (ICD10-I70.0). Electronically Signed   By: Lorriane Shire M.D.   On: 04/12/2019 13:04   ECHOCARDIOGRAM LIMITED  Result Date: 04/16/2019   ECHOCARDIOGRAM LIMITED REPORT   Patient Name:   Jade Sullivan Date of Exam: 04/16/2019 Medical Rec #:  [id number]       Height:       65.0 in Accession #:    [id number]      Weight:       210.0 lb Date of Birth:  Feb 03, 1946       BSA:          2.02 m Patient Age:    55 years        BP:           144/84 mmHg Patient Gender: F               HR:  67 bpm. Exam Location:  Inpatient  Procedure: Limited Echo, Limited Color Doppler and Cardiac Doppler Indications:     Chest Pain 786.50 / R07.9Chest Pain 786.50 / R07.9  History:        Patient has no prior history of Echocardiogram examinations.                 Covid 19. Chronic kidney disease. thyroid disease.  Sonographer:    Darlina Sicilian RDCS Referring Phys: [place]  1. Left ventricular ejection fraction, by visual estimation, is 55 to 60%. The left ventricle has normal function. There is severely increased left ventricular wall thickness.  2. Left ventricular diastolic parameters are consistent with Grade II diastolic dysfunction (pseudonormalization).  3. Elevated left atrial pressure.  4. Global right ventricle has normal systolc function.The right ventricular size is normal.  5. Left atrial size was severely dilated.  6. Moderate pericardial effusion, measuring up to 1.7cm adjacent to LV lateral wall.  7. The mitral valve is normal in structure. Mild mitral valve regurgitation.  8. The tricuspid valve was normal in structure. Tricuspid valve regurgitation is trivial.  9. The aortic valve is tricuspid. Aortic valve regurgitation is mild to moderate. The aortic valve is has no stenosis. 10. 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 11. Severely dilated pulmonary artery, measures 21mm 12. The inferior vena cava is normal in size with greater than 50% respiratory variability, suggesting right atrial pressure of 3 mmHg. 13. The tricuspid regurgitant velocity is 2.74 m/s, and with an assumed right atrial pressure of 3 mmHg, the estimated right ventricular systolic pressure is mildly elevated at 33.0 mmHg. 14. Normal LV systolic function, Grade 2 diastolic dysfunction, normal RV function, mild-moderate AI, ascending aorta dilatation (19mm), severe main pulmonary artery dilatation (79mm), RVSP 33, moderate pericardial effusion without evidence of tamponade FINDINGS  Left Ventricle: Left ventricular ejection fraction, by visual estimation, is 55 to 60%. The left ventricle has normal  function. The left ventricle has no regional wall motion abnormalities. There is severely increased left ventricular wall thickness. Left ventricular diastolic parameters are consistent with Grade II diastolic dysfunction (pseudonormalization). Elevated left atrial pressure. Right Ventricle: The right ventricular size is normal. Global RV systolic function is has normal systolic function. The tricuspid regurgitant velocity is 2.74 m/s, and with an assumed right atrial pressure of 3 mmHg, the estimated right ventricular systolic pressure is mildly elevated at 33.0 mmHg. Left Atrium: Left atrial size was severely dilated. Pericardium: A moderately sized pericardial effusion is present is seen. A moderately sized pericardial effusion is present. Mitral Valve: The mitral valve is normal in structure. MV Area by PHT, 3.46 cm. MV PHT, 63.51 msec. Mild mitral valve regurgitation. Tricuspid Valve: The tricuspid valve is normal in structure. Tricuspid valve regurgitation is trivial. Aortic Valve: The aortic valve is tricuspid. Aortic valve regurgitation is mild to moderate. Aortic regurgitation PHT measures 678 msec. The aortic valve is structurally normal, with no evidence of sclerosis or stenosis. Pulmonic Valve: The pulmonic valve was not well visualized. Pulmonic valve regurgitation is mild by color flow Doppler. Pulmonic regurgitation is mild by color flow Doppler. Aorta: Aortic dilatation noted. 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 Pulmonary Artery: The pulmonary artery is severely dilated. Venous: The inferior vena cava is normal in size with greater than 50% respiratory variability, suggesting right atrial pressure of 3 mmHg.  LEFT VENTRICLE          Normals PLAX 2D LVIDd:  4.30 cm  3.6 cm   Diastology                 Normals LVIDs:         3.00 cm  1.7 cm   LV e' lateral:   5.33 cm/s 6.42 cm/s LV PW:         1.60 cm  1.4 cm   LV E/e' lateral: 19.9      15.4 LV IVS:        1.90 cm  1.3  cm   LV e' medial:    4.35 cm/s 6.96 cm/s LVOT diam:     2.10 cm  2.0 cm   LV E/e' medial:  24.4      6.96 LV SV:         48 ml    79 ml LV SV Index:   22.57    45 ml/m2 LVOT Area:     3.46 cm 3.14 cm2  LEFT ATRIUM              Index LA diam:        3.70 cm  1.83 cm/m LA Vol (A2C):   125.0 ml 61.89 ml/m LA Vol (A4C):   94.1 ml  46.59 ml/m LA Biplane Vol: 114.0 ml 56.45 ml/m  AORTIC VALVE          Normals AI PHT:      678 msec  AORTA                 Normals Ao Root diam: 3.50 cm 31 mm Ao Asc diam:  4.10 cm 31 mm MITRAL VALVE              Normals    TRICUSPID VALVE             Normals MV Area (PHT): 3.46 cm              TR Peak grad:   30.0 mmHg MV PHT:        63.51 msec 55 ms      TR Vmax:        274.00 cm/s 288 cm/s MV Decel Time: 219 msec   187 ms MV E velocity: 106.00 cm/s 103 cm/s  SHUNTS MV A velocity: 87.50 cm/s  70.3 cm/s Systemic Diam: 2.10 cm MV E/A ratio:  1.21        1.5  Oswaldo Milian MD Electronically signed by Oswaldo Milian MD Signature Date/Time: 04/16/2019/2:01:31 PMThe mitral valve is normal in structure.    Final

## 2019-04-22 DIAGNOSIS — E119 Type 2 diabetes mellitus without complications: Secondary | ICD-10-CM

## 2019-04-22 LAB — COMPREHENSIVE METABOLIC PANEL
ALT: 28 U/L (ref 0–44)
AST: 25 U/L (ref 15–41)
Albumin: 3 g/dL — ABNORMAL LOW (ref 3.5–5.0)
Alkaline Phosphatase: 63 U/L (ref 38–126)
Anion gap: 10 (ref 5–15)
BUN: 34 mg/dL — ABNORMAL HIGH (ref 8–23)
CO2: 27 mmol/L (ref 22–32)
Calcium: 9.1 mg/dL (ref 8.9–10.3)
Chloride: 102 mmol/L (ref 98–111)
Creatinine, Ser: 1.05 mg/dL — ABNORMAL HIGH (ref 0.44–1.00)
GFR calc Af Amer: >60 mL/min (ref >60)
GFR calc non Af Amer: 53 mL/min — ABNORMAL LOW (ref >60)
Glucose, Bld: 92 mg/dL (ref 70–99)
Potassium: 4.2 mmol/L (ref 3.5–5.1)
Sodium: 139 mmol/L (ref 135–145)
Total Bilirubin: 1.1 mg/dL (ref 0.3–1.2)
Total Protein: 6.1 g/dL — ABNORMAL LOW (ref 6.5–8.1)

## 2019-04-22 LAB — CBC
HCT: 38.6 % (ref 36.0–46.0)
Hemoglobin: 12.8 g/dL (ref 12.0–15.0)
MCH: 30.4 pg (ref 26.0–34.0)
MCHC: 33.2 g/dL (ref 30.0–36.0)
MCV: 91.7 fL (ref 80.0–100.0)
Platelets: 217 10*3/uL (ref 150–400)
RBC: 4.21 MIL/uL (ref 3.87–5.11)
RDW: 13.6 % (ref 11.5–15.5)
WBC: 14.6 10*3/uL — ABNORMAL HIGH (ref 4.0–10.5)
nRBC: 0 % (ref 0.0–0.2)

## 2019-04-22 LAB — GLUCOSE, CAPILLARY
Glucose-Capillary: 113 mg/dL — ABNORMAL HIGH (ref 70–99)
Glucose-Capillary: 275 mg/dL — ABNORMAL HIGH (ref 70–99)

## 2019-04-22 LAB — C-REACTIVE PROTEIN: CRP: 1.1 mg/dL — ABNORMAL HIGH (ref <1.0)

## 2019-04-22 MED ORDER — SITAGLIPTIN PHOSPHATE 50 MG PO TABS: 50.0000 mg | ORAL_TABLET | Freq: Every day | ORAL | 0 refills | Status: DC

## 2019-04-22 MED ORDER — METFORMIN HCL 500 MG PO TABS: 500.0000 mg | ORAL_TABLET | Freq: Two times a day (BID) | ORAL | 11 refills | Status: DC

## 2019-04-22 MED ORDER — BLOOD GLUCOSE METER KIT: PACK | 0 refills | Status: DC

## 2019-04-22 NOTE — Progress Notes (Signed)
Patient discharged to home with family care. VSS, 2L [place], no complaints of pain or discomfort. PIVs discontinued without complications. Received discharged instructions and verbalized understanding, had no further questions. She was extensively educated on diabetic management. Given glucometer and lancets start kit and educated on checking blood glucose 3-4 times a day and maintain a log of her readings. Patient was able to demonstrate learning. She verbalized that if she has any trouble she had people at home that can help her with this new learning. She was also strongly encouraged to attend her upcoming appointment on Feb. 18th with her PCP and further educate herself or her new diagnosis of diabetes and to assess her current management. Left the unit accompanied by staff on wheelchair with her new glucometer starter kit, rolling walker, diabetic information and oxygen tank. Transported home with family via private vehicle. All personal belongings taken with patient.

## 2019-04-22 NOTE — Progress Notes (Signed)
PT Cancellation Note  Patient Details Name: Jade Sullivan MRN: [id number] DOB: 10/04/1945   Cancelled Treatment:     Orders receied and chart reviewed attempted to see pt at this time but she was not in her room. Will attempt to follow up at a later time to complete Eval and tx.  Horald Chestnut, PT    Delford Field 04/22/2019, 12:46 PM

## 2019-04-22 NOTE — Progress Notes (Signed)
Occupational Therapy Treatment Patient Details Name: Jade Sullivan MRN: [id number] DOB: 1945-11-22 Today's Date: 04/22/2019    History of present illness 74 yo female admitted with COVID Pna, new onset DM. Hx of CKD, gout, obesity   OT comments  Pt progressing towards established OT goals and performing functional mobility in hallway with Supervision-Min Guard A and RW. Pt dropping to 80s on RA during mobility; placed pt on 2L O2. SpO2 dropping again to 80s again finished with mobility and pt able to recover back to 90s with seated rest break, increased time, and 2L O2. Provided education and handout on energy conservation for ADLs/IADLs; pt verbalized understanding. Continue to recommend dc to home with HHOT and will continue to follow acutely as admitted.    Follow Up Recommendations  Home health OT;Supervision/Assistance - 24 hour    Equipment Recommendations  None recommended by OT    Recommendations for Other Services      Precautions / Restrictions Precautions Precautions: Fall Precaution Comments: monitor O2 sats. Pt currently on 6L [place] Restrictions Weight Bearing Restrictions: No       Mobility Bed Mobility               General bed mobility comments: In recliner upon arrival  Transfers Overall transfer level: Needs assistance Equipment used: Rolling walker (2 wheeled) Transfers: Sit to/from Stand Sit to Stand: Supervision         General transfer comment: Supervision for safety    Balance Overall balance assessment: Needs assistance Sitting-balance support: No upper extremity supported;Feet supported Sitting balance-Leahy Scale: Fair     Standing balance support: No upper extremity supported;During functional activity Standing balance-Leahy Scale: Fair                             ADL either performed or assessed with clinical judgement   ADL Overall ADL's : Needs assistance/impaired                         Toilet  Transfer: Min guard;Ambulation;RW(Simulated to recliner) Toilet Transfer Details (indicate cue type and reason): Min GUard A for safety.        Tub/Shower Transfer Details (indicate cue type and reason): Discussed use of shower seat for energy conservation Functional mobility during ADLs: Min guard;Rolling walker(functional mobility in hallway) General ADL Comments: Decreased activity tolerance with decreased SpO2 requiring 2L O2 with activity     Vision       Perception     Praxis      Cognition Arousal/Alertness: Awake/alert Behavior During Therapy: WFL for tasks assessed/performed Overall Cognitive Status: Within Functional Limits for tasks assessed                                 General Comments: Motivated to participate in therapy        Exercises Exercises: Other exercises Other Exercises Other Exercises: Providing pt with education and handout for energy conservation for ADLs and IADLs. pt verbalized understanding   Shoulder Instructions       General Comments SpO2 dropping to 87% on RA with activity. O2 monitor with poor reading and placed pt on 2L for mobility. Once back to room after mobility in hallway, Spo2 dropping to 83% and pt requiring seated rest break and 2L.    Pertinent Vitals/ Pain       Pain Assessment: No/denies  pain  Home Living                                          Prior Functioning/Environment              Frequency  Min 2X/week        Progress Toward Goals  OT Goals(current goals can now be found in the care plan section)  Progress towards OT goals: Progressing toward goals  Acute Rehab OT Goals Patient Stated Goal: to get better OT Goal Formulation: With patient Time For Goal Achievement: 04/26/19 Potential to Achieve Goals: Good ADL Goals Pt Will Perform Lower Body Bathing: with modified independence;sit to/from stand Pt Will Perform Lower Body Dressing: with modified independence;sit  to/from stand Pt Will Transfer to Toilet: with modified independence;regular height toilet Pt Will Perform Toileting - Clothing Manipulation and hygiene: with modified independence;sit to/from stand  Plan Discharge plan remains appropriate    Co-evaluation                 AM-PAC OT "6 Clicks" Daily Activity     Outcome Measure   Help from another person eating meals?: None Help from another person taking care of personal grooming?: A Little Help from another person toileting, which includes using toliet, bedpan, or urinal?: A Little Help from another person bathing (including washing, rinsing, drying)?: A Little Help from another person to put on and taking off regular upper body clothing?: A Little Help from another person to put on and taking off regular lower body clothing?: A Little 6 Click Score: 19    End of Session Equipment Utilized During Treatment: Oxygen(2L)  OT Visit Diagnosis: Unsteadiness on feet (R26.81);Repeated falls (R29.6);Muscle weakness (generalized) (M62.81);History of falling (Z91.81)   Activity Tolerance Patient tolerated treatment well   Patient Left in chair;with call bell/phone within reach   Nurse Communication Mobility status        Time: [id number] OT Time Calculation (min): 31 min  Charges: OT General Charges $OT Visit: 1 Visit OT Treatments $Self Care/Home Management : 23-37 mins  Brenda, OTR/L Acute Rehab Pager: [phone] Office: [place] 04/22/2019, 12:58 PM

## 2019-04-22 NOTE — Discharge Summary (Addendum)
PATIENT DETAILS Name: GIAMARIE BUECHE Age: 74 y.o. Sex: female Date of Birth: 1946/01/16 MRN: [id number]. Admitting Physician: Toy Baker, MD BEE:[id number], Real Cons, MD  Admit Date: 04/13/2019 Discharge date: 04/22/2019  Recommendations for Outpatient Follow-up:  1. Follow up with PCP in 1-2 weeks 2. Please obtain CMP/CBC in one week 3. Repeat Chest Xray in 4-6 week 4. Please reassess if patient still requires home O2 at next visit 5. Incidental finding of possible cirrhotic liver on CT imaging-PCP to consider work-up in the outpatient setting. 6. Incidental finding of moderate pericardial effusion on echocardiogram-please consider repeating echocardiogram in the near future 7. Appears to have pulmonary arterial hypertension per echo-consider outpatient cardiology work-up.  Admitted From:  Home  Disposition: Home with home health services   Home Health:  Yes  Equipment/Devices: Oxygen 2 L-mostly with ambulation per RN report.  Discharge Condition: Stable  CODE STATUS: FULL CODE  Diet recommendation:  Diet Order            Diet - low sodium heart healthy        Diet Carb Modified        Diet regular Room service appropriate? Yes; Fluid consistency: Thin; Fluid restriction: 1200 mL Fluid  Diet effective now               Brief Summary: See H&P, Labs, Consult and Test reports for all details in brief, Patient is a 74 y.o. female with PMHx of CKD stage III, hypothyroidism, HTN-diagnosed with COVID-19 on 1/4-presented to the ED with acute hypoxic respiratory failure secondary to COVID-19 pneumonia.   Brief Hospital Course: Acute Hypoxic Resp Failure due to Covid 19 Viral pneumonia:  Significantly improved-treated with steroids and remdesivir.  She was on room air this morning-Per RN-does drop down to 85-86% with ambulation and requires 2 L of oxygen to maintain her sats with ambulation.  At rest her O2 saturations are in the 90s on room air.  Has  completed a course of steroids and remdesivir.    COVID-19 Labs:  Recent Labs    04/20/19 0212 04/20/19 1400 04/21/19 0516 04/21/19 1000 04/21/19 1400 04/22/19 0600  DDIMER  --  1.98*  --  1.69* 1.77*  --   CRP   < >  --  2.0* 1.9*  --  1.1*   < > = values in this interval not displayed.    No results found for: SARSCOV2NAA   COVID-19 Medications: Steroids: 1/12>>1/21 Remdesivir: 1/12>> 1/16 Actemra: 1/13 x 1  AKI on CKD stage IIIa: AKI resolved-creatinine close to usual baseline.  Hypernatremia: Resolved.  Chronic diastolic heart failure: Volume status stable-continue diuretics.  Dyslipidemia: Continue statin  HTN: BP well controlled-we will resume her usual antihypertensives on discharge and have her follow-up with PCP.    DM-2 (A1c 9.8): Although not a new diagnosis-previous A1c's have been elevated as well-patient was not on any medications prior to this hospital stay.  During this hospital stay-she had hyperglycemia related to steroids as well.  She was maintained on insulin.  On discharge-patient does not want to go home on insulin and prefers oral medications.  Will start Metformin and Januvia-patient will follow up with her PCP for further optimization.   Hypothyroidism: Continue Synthroid  Incidental findings during this hospital stay -CT scan chest with possible cirrhotic liver: Stable for outpatient follow-up/work-up by PCP -Possible pulmonary arterial hypertension on echocardiogram: Stable for further work-up by PCP -Mild to moderate pericardial effusion without any tamponade physiology on echo: Stable for repeat  echo by PCP.  Obesity: Estimated body mass index is 34.95 kg/m as calculated from the following:   Height as of this encounter: '5\' 5"'  (1.651 m).   Weight as of this encounter: 95.3 kg.   Procedures/Studies: None  Discharge Diagnoses:  Principal Problem:   Pneumonia due to COVID-19 virus Active Problems:   Obesity, unspecified   CKD  (chronic kidney disease) stage 3, GFR 30-59 ml/min   Mixed hyperlipidemia   Hypothyroidism   Acute respiratory failure with hypoxia [facility])   Discharge Instructions:    Person Under Monitoring Name: ARELYS GLASSCO  Location: [place] [address]   Infection Prevention Recommendations for Individuals Confirmed to have, or Being Evaluated for, 2019 Novel Coronavirus (COVID-19) Infection Who Receive Care at Home  Individuals who are confirmed to have, or are being evaluated for, COVID-19 should follow the prevention steps below until a healthcare provider or local or state health department says they can return to normal activities.  Stay home except to get medical care You should restrict activities outside your home, except for getting medical care. Do not go to work, school, or public areas, and do not use public transportation or taxis.  Call ahead before visiting your doctor Before your medical appointment, call the healthcare provider and tell them that you have, or are being evaluated for, COVID-19 infection. This will help the healthcare provider's office take steps to keep other people from getting infected. Ask your healthcare provider to call the local or state health department.  Monitor your symptoms Seek prompt medical attention if your illness is worsening (e.g., difficulty breathing). Before going to your medical appointment, call the healthcare provider and tell them that you have, or are being evaluated for, COVID-19 infection. Ask your healthcare provider to call the local or state health department.  Wear a facemask You should wear a facemask that covers your nose and mouth when you are in the same room with other people and when you visit a healthcare provider. People who live with or visit you should also wear a facemask while they are in the same room with you.  Separate yourself from other people in your home As much as possible, you  should stay in a different room from other people in your home. Also, you should use a separate bathroom, if available.  Avoid sharing household items You should not share dishes, drinking glasses, cups, eating utensils, towels, bedding, or other items with other people in your home. After using these items, you should wash them thoroughly with soap and water.  Cover your coughs and sneezes Cover your mouth and nose with a tissue when you cough or sneeze, or you can cough or sneeze into your sleeve. Throw used tissues in a lined trash can, and immediately wash your hands with soap and water for at least 20 seconds or use an alcohol-based hand rub.  Wash your [facility] your hands often and thoroughly with soap and water for at least 20 seconds. You can use an alcohol-based hand sanitizer if soap and water are not available and if your hands are not visibly dirty. Avoid touching your eyes, nose, and mouth with unwashed hands.   Prevention Steps for Caregivers and Household Members of Individuals Confirmed to have, or Being Evaluated for, COVID-19 Infection Being Cared for in the Home  If you live with, or provide care at home for, a person confirmed to have, or being evaluated for, COVID-19 infection please follow these guidelines  to prevent infection:  Follow healthcare provider's instructions Make sure that you understand and can help the patient follow any healthcare provider instructions for all care.  Provide for the patient's basic needs You should help the patient with basic needs in the home and provide support for getting groceries, prescriptions, and other personal needs.  Monitor the patient's symptoms If they are getting sicker, call his or her medical provider and tell them that the patient has, or is being evaluated for, COVID-19 infection. This will help the healthcare provider's office take steps to keep other people from getting infected. Ask the healthcare provider  to call the local or state health department.  Limit the number of people who have contact with the patient  If possible, have only one caregiver for the patient.  Other household members should stay in another home or place of residence. If this is not possible, they should stay  in another room, or be separated from the patient as much as possible. Use a separate bathroom, if available.  Restrict visitors who do not have an essential need to be in the home.  Keep older adults, very young children, and other sick people away from the patient Keep older adults, very young children, and those who have compromised immune systems or chronic health conditions away from the patient. This includes people with chronic heart, lung, or kidney conditions, diabetes, and cancer.  Ensure good ventilation Make sure that shared spaces in the home have good air flow, such as from an air conditioner or an opened window, weather permitting.  Wash your hands often  Wash your hands often and thoroughly with soap and water for at least 20 seconds. You can use an alcohol based hand sanitizer if soap and water are not available and if your hands are not visibly dirty.  Avoid touching your eyes, nose, and mouth with unwashed hands.  Use disposable paper towels to dry your hands. If not available, use dedicated cloth towels and replace them when they become wet.  Wear a facemask and gloves  Wear a disposable facemask at all times in the room and gloves when you touch or have contact with the patient's blood, body fluids, and/or secretions or excretions, such as sweat, saliva, sputum, nasal mucus, vomit, urine, or feces.  Ensure the mask fits over your nose and mouth tightly, and do not touch it during use.  Throw out disposable facemasks and gloves after using them. Do not reuse.  Wash your hands immediately after removing your facemask and gloves.  If your personal clothing becomes contaminated, carefully  remove clothing and launder. Wash your hands after handling contaminated clothing.  Place all used disposable facemasks, gloves, and other waste in a lined container before disposing them with other household waste.  Remove gloves and wash your hands immediately after handling these items.  Do not share dishes, glasses, or other household items with the patient  Avoid sharing household items. You should not share dishes, drinking glasses, cups, eating utensils, towels, bedding, or other items with a patient who is confirmed to have, or being evaluated for, COVID-19 infection.  After the person uses these items, you should wash them thoroughly with soap and water.  Wash laundry thoroughly  Immediately remove and wash clothes or bedding that have blood, body fluids, and/or secretions or excretions, such as sweat, saliva, sputum, nasal mucus, vomit, urine, or feces, on them.  Wear gloves when handling laundry from the patient.  Read and follow directions on  labels of laundry or clothing items and detergent. In general, wash and dry with the warmest temperatures recommended on the label.  Clean all areas the individual has used often  Clean all touchable surfaces, such as counters, tabletops, doorknobs, bathroom fixtures, toilets, phones, keyboards, tablets, and bedside tables, every day. Also, clean any surfaces that may have blood, body fluids, and/or secretions or excretions on them.  Wear gloves when cleaning surfaces the patient has come in contact with.  Use a diluted bleach solution (e.g., dilute bleach with 1 part bleach and 10 parts water) or a household disinfectant with a label that says EPA-registered for coronaviruses. To make a bleach solution at home, add 1 tablespoon of bleach to 1 quart (4 cups) of water. For a larger supply, add  cup of bleach to 1 gallon (16 cups) of water.  Read labels of cleaning products and follow recommendations provided on product labels. Labels  contain instructions for safe and effective use of the cleaning product including precautions you should take when applying the product, such as wearing gloves or eye protection and making sure you have good ventilation during use of the product.  Remove gloves and wash hands immediately after cleaning.  Monitor yourself for signs and symptoms of illness Caregivers and household members are considered close contacts, should monitor their health, and will be asked to limit movement outside of the home to the extent possible. Follow the monitoring steps for close contacts listed on the symptom monitoring form.   ? If you have additional questions, contact your local health department or call the epidemiologist on call at [phone] (available 24/7). ? This guidance is subject to change. For the most up-to-date guidance from CDC, please refer to their website: YouBlogs.pl    Activity:  As tolerated with Full fall precautions use walker/cane & assistance as needed   Discharge Instructions    Call MD for:  difficulty breathing, headache or visual disturbances   Complete by: As directed    Call MD for:  extreme fatigue   Complete by: As directed    Call MD for:  persistant dizziness or light-headedness   Complete by: As directed    Call MD for:  persistant nausea and vomiting   Complete by: As directed    Diet - low sodium heart healthy   Complete by: As directed    Diet Carb Modified   Complete by: As directed    Discharge instructions   Complete by: As directed    1) 3 weeks of isolation from 04/05/2019  2) you have a few incidental findings on imaging studies:  -You appear to have increased pressure in your pulmonary artery-please ask your primary care practitioner for referral to cardiology. -You have pericardial effusion (small amount of fluid around your heart) this was seen on echocardiogram of your heart, please  ask your primary care practitioner to repeat an echocardiogram (which is a ultrasound of your heart) in the next few weeks. -On CT scan of your chest-your liver appeared cirrhotic-please ask your primary care practitioner to consider further work-up.  3) please check your blood sugars 2-3 times a day-please keep a record of these readings and take it with you for your next visit with PCP.   Follow with Primary MD  Hoyt Koch, MD in 1-2 weeks  Please get a complete blood count and chemistry panel checked by your Primary MD at your next visit, and again as instructed by your Primary MD.  Get Medicines reviewed  and adjusted: Please take all your medications with you for your next visit with your Primary MD  Laboratory/radiological data: Please request your Primary MD to go over all hospital tests and procedure/radiological results at the follow up, please ask your Primary MD to get all Hospital records sent to his/her office.  In some cases, they will be blood work, cultures and biopsy results pending at the time of your discharge. Please request that your primary care M.D. follows up on these results.  Also Note the following: If you experience worsening of your admission symptoms, develop shortness of breath, life threatening emergency, suicidal or homicidal thoughts you must seek medical attention immediately by calling 911 or calling your MD immediately  if symptoms less severe.  You must read complete instructions/literature along with all the possible adverse reactions/side effects for all the Medicines you take and that have been prescribed to you. Take any new Medicines after you have completely understood and accpet all the possible adverse reactions/side effects.   Do not drive when taking Pain medications or sleeping medications (Benzodaizepines)  Do not take more than prescribed Pain, Sleep and Anxiety Medications. It is not advisable to combine anxiety,sleep and pain  medications without talking with your primary care practitioner  Special Instructions: If you have smoked or chewed Tobacco  in the last 2 yrs please stop smoking, stop any regular Alcohol  and or any Recreational drug use.  Wear Seat belts while driving.  Please note: You were cared for by a hospitalist during your hospital stay. Once you are discharged, your primary care physician will handle any further medical issues. Please note that NO REFILLS for any discharge medications will be authorized once you are discharged, as it is imperative that you return to your primary care physician (or establish a relationship with a primary care physician if you do not have one) for your post hospital discharge needs so that they can reassess your need for medications and monitor your lab values.   Increase activity slowly   Complete by: As directed      Allergies as of 04/22/2019      Reactions   Amlodipine    Gingival hyperplasia   Hctz [hydrochlorothiazide]    States caused her to have gout      Medication List    TAKE these medications   atorvastatin 20 MG tablet Commonly known as: LIPITOR Take 1 tablet (20 mg total) by mouth daily.   benzonatate 100 MG capsule Commonly known as: TESSALON Take 1 capsule (100 mg total) by mouth 3 (three) times daily as needed for cough.   blood glucose meter kit and supplies Dispense based on patient and insurance preference. Use up to four times daily as directed. (FOR ICD-10 E10.9, E11.9).   cholecalciferol 25 MCG (1000 UNIT) tablet Commonly known as: VITAMIN D3 Take 1,000 Units by mouth daily.   Catapres 0.2 MG tablet Generic drug: cloNIDine Take 0.2 mg by mouth 2 (two) times daily.   cloNIDine 0.3 mg/24hr patch Commonly known as: CATAPRES - Dosed in mg/24 hr Place 0.3 mg onto the skin once a week.   EXCEDRIN PO Take 1 tablet by mouth as needed (for headache).   fish oil-omega-3 fatty acids 1000 MG capsule Take 2 g by mouth daily.     latanoprost 0.005 % ophthalmic solution Commonly known as: XALATAN Place 1 drop into both eyes at bedtime.   levothyroxine 50 MCG tablet Commonly known as: SYNTHROID Take 1 tablet (50 mcg total) by  mouth daily before breakfast.   losartan 100 MG tablet Commonly known as: COZAAR Take 1 tablet (100 mg total) by mouth daily.   metFORMIN 500 MG tablet Commonly known as: Glucophage Take 1 tablet (500 mg total) by mouth 2 (two) times daily with a meal.   minoxidil 2.5 MG tablet Commonly known as: LONITEN Take 2.5 mg by mouth 2 (two) times daily.   multivitamin tablet Take 1 tablet by mouth daily.   nebivolol 5 MG tablet Commonly known as: BYSTOLIC Take 5 mg by mouth daily.   sitaGLIPtin 50 MG tablet Commonly known as: Januvia Take 1 tablet (50 mg total) by mouth daily.   spironolactone 100 MG tablet Commonly known as: ALDACTONE Take 100 mg by mouth daily.   TART CHERRY ADVANCED PO Take by mouth.   timolol 0.25 % ophthalmic solution Commonly known as: BETIMOL 1-2 drops 2 (two) times daily.            Durable Medical Equipment  (From admission, onward)         Start     Ordered   04/22/19 0830  For home use only DME Walker rolling  Once    Question Answer Comment  Walker: With [place] Wheels   Patient needs a walker to treat with the following condition Physical deconditioning      04/22/19 0830   04/22/19 0830  For home use only DME oxygen  Once    Question Answer Comment  Length of Need 6 Months   Mode or (Route) Nasal cannula   Liters per Minute 2   Frequency Continuous (stationary and portable oxygen unit needed)   Oxygen conserving device Yes   Oxygen delivery system Gas      04/22/19 0830          Allergies  Allergen Reactions  . Amlodipine     Gingival hyperplasia  . Hctz [Hydrochlorothiazide]     States caused her to have gout    Consultations:   None   Other Procedures/Studies: CT CHEST WO CONTRAST  Result Date:  04/19/2019 CLINICAL DATA:  Shortness of breath on exertion. History of COVID-19 infection. EXAM: CT CHEST WITHOUT CONTRAST TECHNIQUE: Multidetector CT imaging of the chest was performed following the standard protocol without IV contrast. COMPARISON:  Chest radiograph 04/18/2019. 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 Atherosclerotic calcification of the aorta. Marked enlargement of the pulmonic trunk. Heart is enlarged. Small pericardial effusion. Mediastinum/Nodes: Mediastinal lymph nodes measure up to 11 mm in the low right paratracheal station and are likely reactive. Hilar regions are difficult to evaluate without IV contrast. No axillary adenopathy. Fluid arising from the right shoulder joint simulates an enlarged lymph node (2/8). Lungs/Pleura: Diffuse patchy bilateral peribronchovascular ground-glass. Image quality in the lung bases is compromised by respiratory motion. Consolidation in the lingula. No pleural fluid. Airway is unremarkable. Upper Abdomen: Liver margin is irregular. Low-attenuation lesions in the liver measure up to 12 mm and are difficult to further characterize due to size. Liver and gallbladder are otherwise unremarkable. Nodular thickening of the lateral limb right adrenal gland. Left adrenal gland is unremarkable. Low-attenuation lesions in the kidneys measure up to 7.3 cm on the right and are likely cysts although some are incompletely imaged and/or too small to characterize. Visualized portions of the spleen, pancreas, stomach and bowel are unremarkable with the exception of a small hiatal hernia. No upper abdominal adenopathy. Musculoskeletal: Degenerative changes in the spine. No worrisome lytic or sclerotic lesions. IMPRESSION: 1.  Moderate COVID-19 pneumonia. 2. Small pericardial effusion. 3. Liver appears cirrhotic. 4.  Aortic atherosclerosis (ICD10-I70.0). 5. Marked enlargement of the pulmonic trunk, indicative of pulmonary arterial  hypertension. Electronically Signed   By: Lorin Picket M.D.   On: 04/19/2019 16:30   DG CHEST PORT 1 VIEW  Result Date: 04/18/2019 CLINICAL DATA:  COVID-19 infection. EXAM: PORTABLE CHEST 1 VIEW COMPARISON:  April 17, 2019. FINDINGS: Stable cardiomegaly. No pneumothorax or pleural effusion is noted. Stable bilateral ill-defined lung opacities are noted consistent with multifocal pneumonia. Bony thorax is unremarkable. IMPRESSION: Stable bilateral lung opacities are noted consistent with multifocal pneumonia. Electronically Signed   By: Marijo Conception M.D.   On: 04/18/2019 14:23   DG CHEST PORT 1 VIEW  Result Date: 04/17/2019 CLINICAL DATA:  Shortness of breath. COVID positive. EXAM: PORTABLE CHEST 1 VIEW COMPARISON:  Radiograph earlier this day at 24 hour FINDINGS: Cardiomegaly is unchanged. Diffuse bilateral heterogeneous pulmonary opacities, confluent in the left lower lobe, not significantly changed. Possible left pleural effusion. No pneumothorax. IMPRESSION: No significant change in diffuse bilateral heterogeneous pulmonary opacities, confluent in the left lower lobe. Possible left pleural effusion. Stable cardiomegaly. Electronically Signed   By: Keith Rake M.D.   On: 04/17/2019 00:19   DG CHEST PORT 1 VIEW  Result Date: 04/16/2019 CLINICAL DATA:  COVID-19 infection. EXAM: PORTABLE CHEST 1 VIEW COMPARISON:  Chest x-ray 04/13/2019. FINDINGS: Stable cardiomegaly. Progressive severe diffuse bilateral pulmonary infiltrates/edema. Low lung volumes. Left pleural effusion cannot be excluded. No pneumothorax. IMPRESSION: 1.  Stable cardiomegaly. 2. Progressive severe diffuse bilateral pulmonary infiltrates/edema. Low lung volumes. Left pleural effusion cannot be excluded. Electronically Signed   By: [name]  Register   On: 04/16/2019 09:14   DG Chest Port 1 View  Result Date: 04/13/2019 CLINICAL DATA:  Shortness of breath, diagnosed with COVID-19 04/05/2019 EXAM: PORTABLE CHEST 1 VIEW  COMPARISON:  Radiograph 04/12/2019 FINDINGS: Increasingly confluent airspace disease in the right lung and retrocardiac space with some developing air bronchograms. No pneumothorax. No visible effusion. Prominence of the cardiac silhouette may be related to combination of low volumes and portable technique. Stable tracheal tortuosity. No acute osseous or soft tissue abnormality. Degenerative changes are present in the imaged spine and shoulders. IMPRESSION: Increasingly confluent airspace disease in the right lung and retrocardiac space with some developing air bronchograms. Given the rapid development, findings are suspicious for pneumonia. Electronically Signed   By: Lovena Le M.D.   On: 04/13/2019 21:36   DG Chest Portable 1 View  Result Date: 04/12/2019 CLINICAL DATA:  Cough. COVID-19. Shortness of breath. EXAM: PORTABLE CHEST 1 VIEW COMPARISON:  None. FINDINGS: There are 3 patchy small hazy infiltrates in the right lung. There is increased density at the left lung base which may represent infiltrate. Heart size and vascularity are normal. Aortic atherosclerosis. No acute bone abnormality. IMPRESSION: 1. Three small patchy infiltrates in the right lung. 2. Increased density at the left lung base which may represent infiltrate. 3.  Aortic Atherosclerosis (ICD10-I70.0). Electronically Signed   By: Lorriane Shire M.D.   On: 04/12/2019 13:04   ECHOCARDIOGRAM LIMITED  Result Date: 04/16/2019   ECHOCARDIOGRAM LIMITED REPORT   Patient Name:   ALIMA NASER Date of Exam: 04/16/2019 Medical Rec #:  [id number]       Height:       65.0 in Accession #:    [id number]      Weight:       210.0 lb Date of Birth:  1945/08/01  BSA:          2.02 m Patient Age:    55 years        BP:           144/84 mmHg Patient Gender: F               HR:           67 bpm. Exam Location:  Inpatient  Procedure: Limited Echo, Limited Color Doppler and Cardiac Doppler Indications:    Chest Pain 786.50 / R07.9Chest Pain 786.50 /  R07.9  History:        Patient has no prior history of Echocardiogram examinations.                 Covid 19. Chronic kidney disease. thyroid disease.  Sonographer:    Darlina Sicilian RDCS Referring Phys: [place]  1. Left ventricular ejection fraction, by visual estimation, is 55 to 60%. The left ventricle has normal function. There is severely increased left ventricular wall thickness.  2. Left ventricular diastolic parameters are consistent with Grade II diastolic dysfunction (pseudonormalization).  3. Elevated left atrial pressure.  4. Global right ventricle has normal systolc function.The right ventricular size is normal.  5. Left atrial size was severely dilated.  6. Moderate pericardial effusion, measuring up to 1.7cm adjacent to LV lateral wall.  7. The mitral valve is normal in structure. Mild mitral valve regurgitation.  8. The tricuspid valve was normal in structure. Tricuspid valve regurgitation is trivial.  9. The aortic valve is tricuspid. Aortic valve regurgitation is mild to moderate. The aortic valve is has no stenosis. 10. 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 11. Severely dilated pulmonary artery, measures 78m 12. The inferior vena cava is normal in size with greater than 50% respiratory variability, suggesting right atrial pressure of 3 mmHg. 13. The tricuspid regurgitant velocity is 2.74 m/s, and with an assumed right atrial pressure of 3 mmHg, the estimated right ventricular systolic pressure is mildly elevated at 33.0 mmHg. 14. Normal LV systolic function, Grade 2 diastolic dysfunction, normal RV function, mild-moderate AI, ascending aorta dilatation (432m, severe main pulmonary artery dilatation (4660m RVSP 33, moderate pericardial effusion without evidence of tamponade FINDINGS  Left Ventricle: Left ventricular ejection fraction, by visual estimation, is 55 to 60%. The left ventricle has normal function. The left ventricle has no regional wall motion  abnormalities. There is severely increased left ventricular wall thickness. Left ventricular diastolic parameters are consistent with Grade II diastolic dysfunction (pseudonormalization). Elevated left atrial pressure. Right Ventricle: The right ventricular size is normal. Global RV systolic function is has normal systolic function. The tricuspid regurgitant velocity is 2.74 m/s, and with an assumed right atrial pressure of 3 mmHg, the estimated right ventricular systolic pressure is mildly elevated at 33.0 mmHg. Left Atrium: Left atrial size was severely dilated. Pericardium: A moderately sized pericardial effusion is present is seen. A moderately sized pericardial effusion is present. Mitral Valve: The mitral valve is normal in structure. MV Area by PHT, 3.46 cm. MV PHT, 63.51 msec. Mild mitral valve regurgitation. Tricuspid Valve: The tricuspid valve is normal in structure. Tricuspid valve regurgitation is trivial. Aortic Valve: The aortic valve is tricuspid. Aortic valve regurgitation is mild to moderate. Aortic regurgitation PHT measures 678 msec. The aortic valve is structurally normal, with no evidence of sclerosis or stenosis. Pulmonic Valve: The pulmonic valve was not well visualized. Pulmonic valve regurgitation is mild by color flow Doppler. Pulmonic  regurgitation is mild by color flow Doppler. Aorta: Aortic dilatation noted. 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 Pulmonary Artery: The pulmonary artery is severely dilated. Venous: The inferior vena cava is normal in size with greater than 50% respiratory variability, suggesting right atrial pressure of 3 mmHg.  LEFT VENTRICLE          Normals PLAX 2D LVIDd:         4.30 cm  3.6 cm   Diastology                 Normals LVIDs:         3.00 cm  1.7 cm   LV e' lateral:   5.33 cm/s 6.42 cm/s LV PW:         1.60 cm  1.4 cm   LV E/e' lateral: 19.9      15.4 LV IVS:        1.90 cm  1.3 cm   LV e' medial:    4.35 cm/s 6.96 cm/s LVOT diam:      2.10 cm  2.0 cm   LV E/e' medial:  24.4      6.96 LV SV:         48 ml    79 ml LV SV Index:   22.57    45 ml/m2 LVOT Area:     3.46 cm 3.14 cm2  LEFT ATRIUM              Index LA diam:        3.70 cm  1.83 cm/m LA Vol (A2C):   125.0 ml 61.89 ml/m LA Vol (A4C):   94.1 ml  46.59 ml/m LA Biplane Vol: 114.0 ml 56.45 ml/m  AORTIC VALVE          Normals AI PHT:      678 msec  AORTA                 Normals Ao Root diam: 3.50 cm 31 mm Ao Asc diam:  4.10 cm 31 mm MITRAL VALVE              Normals    TRICUSPID VALVE             Normals MV Area (PHT): 3.46 cm              TR Peak grad:   30.0 mmHg MV PHT:        63.51 msec 55 ms      TR Vmax:        274.00 cm/s 288 cm/s MV Decel Time: 219 msec   187 ms MV E velocity: 106.00 cm/s 103 cm/s  SHUNTS MV A velocity: 87.50 cm/s  70.3 cm/s Systemic Diam: 2.10 cm MV E/A ratio:  1.21        1.5  Oswaldo Milian MD Electronically signed by Oswaldo Milian MD Signature Date/Time: 04/16/2019/2:01:31 PMThe mitral valve is normal in structure.    Final      TODAY-DAY OF DISCHARGE:  Subjective:   Juliah Scadden today has no headache,no chest abdominal pain,no new weakness tingling or numbness, feels much better wants to go home today.   Objective:   Blood pressure (!) 141/78, pulse 86, temperature 98.3 F (36.8 C), temperature source Oral, resp. rate 16, height '5\' 5"'  (1.651 m), weight 95.3 kg, SpO2 97 %.  Intake/Output Summary (Last 24 hours) at 04/22/2019 1139 Last data filed at 04/22/2019 0800 Gross per 24 hour  Intake 1200  ml  Output --  Net 1200 ml   Filed Weights   04/13/19 1938  Weight: 95.3 kg    Exam: Awake Alert, Oriented *3, No new F.N deficits, Normal affect [place].AT,PERRAL Supple Neck,No JVD, No cervical lymphadenopathy appriciated.  Symmetrical Chest wall movement, Good air movement bilaterally, CTAB RRR,No Gallops,Rubs or new Murmurs, No Parasternal Heave +ve B.Sounds, Abd Soft, Non tender, No organomegaly appriciated, No rebound  -guarding or rigidity. No Cyanosis, Clubbing or edema, No new Rash or bruise   PERTINENT RADIOLOGIC STUDIES: CT CHEST WO CONTRAST  Result Date: 04/19/2019 CLINICAL DATA:  Shortness of breath on exertion. History of COVID-19 infection. EXAM: CT CHEST WITHOUT CONTRAST TECHNIQUE: Multidetector CT imaging of the chest was performed following the standard protocol without IV contrast. COMPARISON:  Chest radiograph 04/18/2019. 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 Atherosclerotic calcification of the aorta. Marked enlargement of the pulmonic trunk. Heart is enlarged. Small pericardial effusion. Mediastinum/Nodes: Mediastinal lymph nodes measure up to 11 mm in the low right paratracheal station and are likely reactive. Hilar regions are difficult to evaluate without IV contrast. No axillary adenopathy. Fluid arising from the right shoulder joint simulates an enlarged lymph node (2/8). Lungs/Pleura: Diffuse patchy bilateral peribronchovascular ground-glass. Image quality in the lung bases is compromised by respiratory motion. Consolidation in the lingula. No pleural fluid. Airway is unremarkable. Upper Abdomen: Liver margin is irregular. Low-attenuation lesions in the liver measure up to 12 mm and are difficult to further characterize due to size. Liver and gallbladder are otherwise unremarkable. Nodular thickening of the lateral limb right adrenal gland. Left adrenal gland is unremarkable. Low-attenuation lesions in the kidneys measure up to 7.3 cm on the right and are likely cysts although some are incompletely imaged and/or too small to characterize. Visualized portions of the spleen, pancreas, stomach and bowel are unremarkable with the exception of a small hiatal hernia. No upper abdominal adenopathy. Musculoskeletal: Degenerative changes in the spine. No worrisome lytic or sclerotic lesions. IMPRESSION: 1. Moderate COVID-19 pneumonia. 2. Small pericardial effusion. 3.  Liver appears cirrhotic. 4.  Aortic atherosclerosis (ICD10-I70.0). 5. Marked enlargement of the pulmonic trunk, indicative of pulmonary arterial hypertension. Electronically Signed   By: Lorin Picket M.D.   On: 04/19/2019 16:30   DG CHEST PORT 1 VIEW  Result Date: 04/18/2019 CLINICAL DATA:  COVID-19 infection. EXAM: PORTABLE CHEST 1 VIEW COMPARISON:  April 17, 2019. FINDINGS: Stable cardiomegaly. No pneumothorax or pleural effusion is noted. Stable bilateral ill-defined lung opacities are noted consistent with multifocal pneumonia. Bony thorax is unremarkable. IMPRESSION: Stable bilateral lung opacities are noted consistent with multifocal pneumonia. Electronically Signed   By: Marijo Conception M.D.   On: 04/18/2019 14:23   DG CHEST PORT 1 VIEW  Result Date: 04/17/2019 CLINICAL DATA:  Shortness of breath. COVID positive. EXAM: PORTABLE CHEST 1 VIEW COMPARISON:  Radiograph earlier this day at 59 hour FINDINGS: Cardiomegaly is unchanged. Diffuse bilateral heterogeneous pulmonary opacities, confluent in the left lower lobe, not significantly changed. Possible left pleural effusion. No pneumothorax. IMPRESSION: No significant change in diffuse bilateral heterogeneous pulmonary opacities, confluent in the left lower lobe. Possible left pleural effusion. Stable cardiomegaly. Electronically Signed   By: Keith Rake M.D.   On: 04/17/2019 00:19   DG CHEST PORT 1 VIEW  Result Date: 04/16/2019 CLINICAL DATA:  COVID-19 infection. EXAM: PORTABLE CHEST 1 VIEW COMPARISON:  Chest x-ray 04/13/2019. FINDINGS: Stable cardiomegaly. Progressive severe diffuse bilateral pulmonary infiltrates/edema. Low lung volumes. Left pleural effusion cannot be excluded. No  pneumothorax. IMPRESSION: 1.  Stable cardiomegaly. 2. Progressive severe diffuse bilateral pulmonary infiltrates/edema. Low lung volumes. Left pleural effusion cannot be excluded. Electronically Signed   By: [name]  Register   On: 04/16/2019 09:14   DG Chest  Port 1 View  Result Date: 04/13/2019 CLINICAL DATA:  Shortness of breath, diagnosed with COVID-19 04/05/2019 EXAM: PORTABLE CHEST 1 VIEW COMPARISON:  Radiograph 04/12/2019 FINDINGS: Increasingly confluent airspace disease in the right lung and retrocardiac space with some developing air bronchograms. No pneumothorax. No visible effusion. Prominence of the cardiac silhouette may be related to combination of low volumes and portable technique. Stable tracheal tortuosity. No acute osseous or soft tissue abnormality. Degenerative changes are present in the imaged spine and shoulders. IMPRESSION: Increasingly confluent airspace disease in the right lung and retrocardiac space with some developing air bronchograms. Given the rapid development, findings are suspicious for pneumonia. Electronically Signed   By: Lovena Le M.D.   On: 04/13/2019 21:36   DG Chest Portable 1 View  Result Date: 04/12/2019 CLINICAL DATA:  Cough. COVID-19. Shortness of breath. EXAM: PORTABLE CHEST 1 VIEW COMPARISON:  None. FINDINGS: There are 3 patchy small hazy infiltrates in the right lung. There is increased density at the left lung base which may represent infiltrate. Heart size and vascularity are normal. Aortic atherosclerosis. No acute bone abnormality. IMPRESSION: 1. Three small patchy infiltrates in the right lung. 2. Increased density at the left lung base which may represent infiltrate. 3.  Aortic Atherosclerosis (ICD10-I70.0). Electronically Signed   By: Lorriane Shire M.D.   On: 04/12/2019 13:04   ECHOCARDIOGRAM LIMITED  Result Date: 04/16/2019   ECHOCARDIOGRAM LIMITED REPORT   Patient Name:   KIMIE PIDCOCK Date of Exam: 04/16/2019 Medical Rec #:  [id number]       Height:       65.0 in Accession #:    [id number]      Weight:       210.0 lb Date of Birth:  Jun 06, 1945       BSA:          2.02 m Patient Age:    16 years        BP:           144/84 mmHg Patient Gender: F               HR:           67 bpm. Exam Location:   Inpatient  Procedure: Limited Echo, Limited Color Doppler and Cardiac Doppler Indications:    Chest Pain 786.50 / R07.9Chest Pain 786.50 / R07.9  History:        Patient has no prior history of Echocardiogram examinations.                 Covid 19. Chronic kidney disease. thyroid disease.  Sonographer:    Darlina Sicilian RDCS Referring Phys: [place]  1. Left ventricular ejection fraction, by visual estimation, is 55 to 60%. The left ventricle has normal function. There is severely increased left ventricular wall thickness.  2. Left ventricular diastolic parameters are consistent with Grade II diastolic dysfunction (pseudonormalization).  3. Elevated left atrial pressure.  4. Global right ventricle has normal systolc function.The right ventricular size is normal.  5. Left atrial size was severely dilated.  6. Moderate pericardial effusion, measuring up to 1.7cm adjacent to LV lateral wall.  7. The mitral valve is normal in structure. Mild mitral valve regurgitation.  8. The tricuspid valve was normal  in structure. Tricuspid valve regurgitation is trivial.  9. The aortic valve is tricuspid. Aortic valve regurgitation is mild to moderate. The aortic valve is has no stenosis. 10. 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 11. Severely dilated pulmonary artery, measures 51m 12. The inferior vena cava is normal in size with greater than 50% respiratory variability, suggesting right atrial pressure of 3 mmHg. 13. The tricuspid regurgitant velocity is 2.74 m/s, and with an assumed right atrial pressure of 3 mmHg, the estimated right ventricular systolic pressure is mildly elevated at 33.0 mmHg. 14. Normal LV systolic function, Grade 2 diastolic dysfunction, normal RV function, mild-moderate AI, ascending aorta dilatation (472m, severe main pulmonary artery dilatation (4644m RVSP 33, moderate pericardial effusion without evidence of tamponade FINDINGS  Left Ventricle: Left ventricular  ejection fraction, by visual estimation, is 55 to 60%. The left ventricle has normal function. The left ventricle has no regional wall motion abnormalities. There is severely increased left ventricular wall thickness. Left ventricular diastolic parameters are consistent with Grade II diastolic dysfunction (pseudonormalization). Elevated left atrial pressure. Right Ventricle: The right ventricular size is normal. Global RV systolic function is has normal systolic function. The tricuspid regurgitant velocity is 2.74 m/s, and with an assumed right atrial pressure of 3 mmHg, the estimated right ventricular systolic pressure is mildly elevated at 33.0 mmHg. Left Atrium: Left atrial size was severely dilated. Pericardium: A moderately sized pericardial effusion is present is seen. A moderately sized pericardial effusion is present. Mitral Valve: The mitral valve is normal in structure. MV Area by PHT, 3.46 cm. MV PHT, 63.51 msec. Mild mitral valve regurgitation. Tricuspid Valve: The tricuspid valve is normal in structure. Tricuspid valve regurgitation is trivial. Aortic Valve: The aortic valve is tricuspid. Aortic valve regurgitation is mild to moderate. Aortic regurgitation PHT measures 678 msec. The aortic valve is structurally normal, with no evidence of sclerosis or stenosis. Pulmonic Valve: The pulmonic valve was not well visualized. Pulmonic valve regurgitation is mild by color flow Doppler. Pulmonic regurgitation is mild by color flow Doppler. Aorta: Aortic dilatation noted. 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 Pulmonary Artery: The pulmonary artery is severely dilated. Venous: The inferior vena cava is normal in size with greater than 50% respiratory variability, suggesting right atrial pressure of 3 mmHg.  LEFT VENTRICLE          Normals PLAX 2D LVIDd:         4.30 cm  3.6 cm   Diastology                 Normals LVIDs:         3.00 cm  1.7 cm   LV e' lateral:   5.33 cm/s 6.42 cm/s LV PW:          1.60 cm  1.4 cm   LV E/e' lateral: 19.9      15.4 LV IVS:        1.90 cm  1.3 cm   LV e' medial:    4.35 cm/s 6.96 cm/s LVOT diam:     2.10 cm  2.0 cm   LV E/e' medial:  24.4      6.96 LV SV:         48 ml    79 ml LV SV Index:   22.57    45 ml/m2 LVOT Area:     3.46 cm 3.14 cm2  LEFT ATRIUM  Index LA diam:        3.70 cm  1.83 cm/m LA Vol (A2C):   125.0 ml 61.89 ml/m LA Vol (A4C):   94.1 ml  46.59 ml/m LA Biplane Vol: 114.0 ml 56.45 ml/m  AORTIC VALVE          Normals AI PHT:      678 msec  AORTA                 Normals Ao Root diam: 3.50 cm 31 mm Ao Asc diam:  4.10 cm 31 mm MITRAL VALVE              Normals    TRICUSPID VALVE             Normals MV Area (PHT): 3.46 cm              TR Peak grad:   30.0 mmHg MV PHT:        63.51 msec 55 ms      TR Vmax:        274.00 cm/s 288 cm/s MV Decel Time: 219 msec   187 ms MV E velocity: 106.00 cm/s 103 cm/s  SHUNTS MV A velocity: 87.50 cm/s  70.3 cm/s Systemic Diam: 2.10 cm MV E/A ratio:  1.21        1.5  Oswaldo Milian MD Electronically signed by Oswaldo Milian MD Signature Date/Time: 04/16/2019/2:01:31 PMThe mitral valve is normal in structure.    Final      PERTINENT LAB RESULTS: CBC: Recent Labs    04/21/19 1000 04/22/19 0600  WBC 14.0* 14.6*  HGB 13.9 12.8  HCT 42.0 38.6  PLT 248 217   CMET CMP     Component Value Date/Time   NA 139 04/22/2019 0600   NA 139 11/11/2017 0000   K 4.2 04/22/2019 0600   CL 102 04/22/2019 0600   CO2 27 04/22/2019 0600   GLUCOSE 92 04/22/2019 0600   BUN 34 (H) 04/22/2019 0600   BUN 15 11/11/2017 0000   CREATININE 1.05 (H) 04/22/2019 0600   CALCIUM 9.1 04/22/2019 0600   PROT 6.1 (L) 04/22/2019 0600   ALBUMIN 3.0 (L) 04/22/2019 0600   AST 25 04/22/2019 0600   ALT 28 04/22/2019 0600   ALKPHOS 63 04/22/2019 0600   BILITOT 1.1 04/22/2019 0600   GFRNONAA 53 (L) 04/22/2019 0600   GFRAA >60 04/22/2019 0600    GFR Estimated Creatinine Clearance: 54.5 mL/min (A) (by C-G formula  based on SCr of 1.05 mg/dL (H)). No results for input(s): LIPASE, AMYLASE in the last 72 hours. No results for input(s): CKTOTAL, CKMB, CKMBINDEX, TROPONINI in the last 72 hours. Invalid input(s): POCBNP Recent Labs    04/21/19 1000 04/21/19 1400  DDIMER 1.69* 1.77*   No results for input(s): HGBA1C in the last 72 hours. No results for input(s): CHOL, HDL, LDLCALC, TRIG, CHOLHDL, LDLDIRECT in the last 72 hours. No results for input(s): TSH, T4TOTAL, T3FREE, THYROIDAB in the last 72 hours.  Invalid input(s): FREET3 No results for input(s): VITAMINB12, FOLATE, FERRITIN, TIBC, IRON, RETICCTPCT in the last 72 hours. Coags: No results for input(s): INR in the last 72 hours.  Invalid input(s): PT Microbiology: Recent Results (from the past 240 hour(s))  Blood Culture (routine x 2)     Status: None   Collection Time: 04/13/19  7:50 PM   Specimen: BLOOD  Result Value Ref Range Status   Specimen Description   Final    BLOOD LEFT ANTECUBITAL Performed at  [facility], [place], [place] [address], [place], [place] [address]    Special Requests   Final    BOTTLES DRAWN AEROBIC ONLY Blood Culture results may not be optimal due to an excessive volume of blood received in culture bottles Performed at [place] [address] [place], [place] [address]    Culture   Final    NO GROWTH 5 DAYS Performed at [facility], [place] [address] [place], [place] [address]    Report Status 04/19/2019 FINAL  Final  Blood Culture (routine x 2)     Status: None   Collection Time: 04/13/19  7:53 PM   Specimen: BLOOD  Result Value Ref Range Status   Specimen Description   Final    BLOOD BLOOD LEFT FOREARM Performed at [facility] [address], [place], [place] [address]    Special Requests   Final    BOTTLES DRAWN AEROBIC AND ANAEROBIC Blood Culture adequate volume Performed at [facility] [address] [place],  [place] [address]    Culture   Final    NO GROWTH 5 DAYS Performed at [facility], [place] [address], [name], [place] [address]    Report Status 04/19/2019 FINAL  Final    FURTHER DISCHARGE INSTRUCTIONS:  Get Medicines reviewed and adjusted: Please take all your medications with you for your next visit with your Primary MD  Laboratory/radiological data: Please request your Primary MD to go over all hospital tests and procedure/radiological results at the follow up, please ask your Primary MD to get all Hospital records sent to his/her office.  In some cases, they will be blood work, cultures and biopsy results pending at the time of your discharge. Please request that your primary care M.D. goes through all the records of your hospital data and follows up on these results.  Also Note the following: If you experience worsening of your admission symptoms, develop shortness of breath, life threatening emergency, suicidal or homicidal thoughts you must seek medical attention immediately by calling 911 or calling your MD immediately  if symptoms less severe.  You must read complete instructions/literature along with all the possible adverse reactions/side effects for all the Medicines you take and that have been prescribed to you. Take any new Medicines after you have completely understood and accpet all the possible adverse reactions/side effects.   Do not drive when taking Pain medications or sleeping medications (Benzodaizepines)  Do not take more than prescribed Pain, Sleep and Anxiety Medications. It is not advisable to combine anxiety,sleep and pain medications without talking with your primary care practitioner  Special Instructions: If you have smoked or chewed Tobacco  in the last 2 yrs please stop smoking, stop any regular Alcohol  and or any Recreational drug use.  Wear Seat belts while driving.  Please note: You were cared for by a hospitalist during your hospital stay. Once you  are discharged, your primary care physician will handle any further medical issues. Please note that NO REFILLS for any discharge medications will be authorized once you are discharged, as it is imperative that you return to your primary care physician (or establish a relationship with a primary care physician if you do not have one) for your post hospital discharge needs so that they can reassess your need for medications and monitor your lab values.  Total Time spent coordinating discharge including counseling, education and face to face time equals 35 minutes.  Signed:  Shatiqua Heroux 04/22/2019 11:39 AM

## 2019-04-22 NOTE — Progress Notes (Addendum)
SATURATION QUALIFICATIONS: (This note is used to comply with regulatory documentation for home oxygen)  Patient Saturations on Room Air at Rest = 95%  Patient Saturations on Room Air while Ambulating = 86%  Patient Saturations on 2 Liters of oxygen while Ambulating = 91%  Please briefly explain why patient needs home oxygen:  Although patient didn't experience dyspnea on exertion, patient's saturation dropped once she started ambulating. Had to be put on oxygen and with 2L her saturation climbed to 91%. While at rest she can hold 95-98% on room air.

## 2019-04-22 NOTE — Care Management Important Message (Addendum)
Important Message  Patient Details  Name: Jade Sullivan MRN: [id number] Date of Birth: 1945-07-17   Medicare Important Message Given:  Yes - Important Message mailed due to current National Emergency   Verbal consent obtained due to current National Emergency  Relationship to patient: Self Contact Name: Kayman Rubottom Call Date: 04/22/19  Time: 1214 Phone: [phone] Outcome: Spoke with contact Important Message mailed to: Patient address on file     Tommy Medal 04/22/2019, 12:14 PM

## 2019-04-22 NOTE — Discharge Instructions (Signed)
Recommendations for Outpatient Follow-up:  1. Follow up with PCP in 1-2 weeks 2. Please obtain CMP/CBC in one week 3. Repeat Chest Xray in 4-6 week 4. Please reassess if patient still requires home O2 at next visit 5. Incidental finding of possible cirrhotic liver on CT imaging-PCP to consider work-up in the outpatient setting. 6. Incidental finding of moderate pericardial effusion on echocardiogram-please consider repeating echocardiogram in the near future 7. Appears to have pulmonary arterial hypertension per echo-consider outpatient cardiology work-up    COVID-19 Frequently Asked Questions COVID-19 (coronavirus disease) is an infection that is caused by a large family of viruses. Some viruses cause illness in people and others cause illness in animals like camels, cats, and bats. In some cases, the viruses that cause illness in animals can spread to humans. Where did the coronavirus come from? In December 2019, [place] told the [facility]) of several cases of lung disease (human respiratory illness). These cases were linked to an open seafood and livestock market in the city of [place]. The link to the seafood and livestock market suggests that the virus may have spread from animals to humans. However, since that first outbreak in December, the virus has also been shown to spread from person to person. What is the name of the disease and the virus? Disease name Early on, this disease was called novel coronavirus. This is because scientists determined that the disease was caused by a new (novel) respiratory virus. The World Health Organization [facility]) has now named the disease COVID-19, or coronavirus disease. Virus name The virus that causes the disease is called severe acute respiratory syndrome coronavirus 2 (SARS-CoV-2). More information on disease and virus naming World Health Organization [facility]):  www.who.int/emergencies/diseases/novel-coronavirus-2019/technical-guidance/naming-the-coronavirus-disease-(covid-2019)-and-the-virus-that-causes-it Who is at risk for complications from coronavirus disease? Some people may be at higher risk for complications from coronavirus disease. This includes older adults and people who have chronic diseases, such as heart disease, diabetes, and lung disease. If you are at higher risk for complications, take these extra precautions:  Stay home as much as possible.  Avoid social gatherings and travel.  Avoid close contact with others. Stay at least 6 ft (2 m) away from others, if possible.  Wash your hands often with soap and water for at least 20 seconds.  Avoid touching your face, mouth, nose, or eyes.  Keep supplies on hand at home, such as food, medicine, and cleaning supplies.  If you must go out in public, wear a cloth face covering or face mask. Make sure your mask covers your nose and mouth. How does coronavirus disease spread? The virus that causes coronavirus disease spreads easily from person to person (is contagious). You may catch the virus by:  Breathing in droplets from an infected person. Droplets can be spread by a person breathing, speaking, singing, coughing, or sneezing.  Touching something, like a table or a doorknob, that was exposed to the virus (contaminated) and then touching your mouth, nose, or eyes. Can I get the virus from touching surfaces or objects? There is still a lot that we do not know about the virus that causes coronavirus disease. Scientists are basing a lot of information on what they know about similar viruses, such as:  Viruses cannot generally survive on surfaces for long. They need a human body (host) to survive.  It is more likely that the virus is spread by close contact with people who are sick (direct contact), such as through: ? Shaking hands or hugging. ? Breathing  in respiratory droplets that  travel through the air. Droplets can be spread by a person breathing, speaking, singing, coughing, or sneezing.  It is less likely that the virus is spread when a person touches a surface or object that has the virus on it (indirect contact). The virus may be able to enter the body if the person touches a surface or object and then touches his or her face, eyes, nose, or mouth. Can a person spread the virus without having symptoms of the disease? It may be possible for the virus to spread before a person has symptoms of the disease, but this is most likely not the main way the virus is spreading. It is more likely for the virus to spread by being in close contact with people who are sick and breathing in the respiratory droplets spread by a person breathing, speaking, singing, coughing, or sneezing. What are the symptoms of coronavirus disease? Symptoms vary from person to person and can range from mild to severe. Symptoms may include:  Fever or chills.  Cough.  Difficulty breathing or feeling short of breath.  Headaches, body aches, or muscle aches.  Runny or stuffy (congested) nose.  Sore throat.  New loss of taste or smell.  Nausea, vomiting, or diarrhea. These symptoms can appear anywhere from 2 to 14 days after you have been exposed to the virus. Some people may not have any symptoms. If you develop symptoms, call your health care provider. People with severe symptoms may need hospital care. Should I be tested for this virus? Your health care provider will decide whether to test you based on your symptoms, history of exposure, and your risk factors. How does a health care provider test for this virus? Health care providers will collect samples to send for testing. Samples may include:  Taking a swab of fluid from the back of your nose and throat, your nose, or your throat.  Taking fluid from the lungs by having you cough up mucus (sputum) into a sterile cup.  Taking a blood  sample. Is there a treatment or vaccine for this virus? Currently, there is no vaccine to prevent coronavirus disease. Also, there are no medicines like antibiotics or antivirals to treat the virus. A person who becomes sick is given supportive care, which means rest and fluids. A person may also relieve his or her symptoms by using over-the-counter medicines that treat sneezing, coughing, and runny nose. These are the same medicines that a person takes for the common cold. If you develop symptoms, call your health care provider. People with severe symptoms may need hospital care. What can I do to protect myself and my family from this virus?     You can protect yourself and your family by taking the same actions that you would take to prevent the spread of other viruses. Take the following actions:  Wash your hands often with soap and water for at least 20 seconds. If soap and water are not available, use alcohol-based hand sanitizer.  Avoid touching your face, mouth, nose, or eyes.  Cough or sneeze into a tissue, sleeve, or elbow. Do not cough or sneeze into your hand or the air. ? If you cough or sneeze into a tissue, throw it away immediately and wash your hands.  Disinfect objects and surfaces that you frequently touch every day.  Stay away from people who are sick.  Avoid going out in public, follow guidance from your state and local health authorities.  Avoid  crowded indoor spaces. Stay at least 6 ft (2 m) away from others.  If you must go out in public, wear a cloth face covering or face mask. Make sure your mask covers your nose and mouth.  Stay home if you are sick, except to get medical care. Call your health care provider before you get medical care. Your health care provider will tell you how long to stay home.  Make sure your vaccines are up to date. Ask your health care provider what vaccines you need. What should I do if I need to travel? Follow travel recommendations  from your local health authority, the CDC, and WHO. Travel information and advice  Centers for Disease Control and Prevention (CDC): BodyEditor.hu  World Health Organization [facility]): ThirdIncome.ca Know the risks and take action to protect your health  You are at higher risk of getting coronavirus disease if you are traveling to areas with an outbreak or if you are exposed to travelers from areas with an outbreak.  Wash your hands often and practice good hygiene to lower the risk of catching or spreading the virus. What should I do if I am sick? General instructions to stop the spread of infection  Wash your hands often with soap and water for at least 20 seconds. If soap and water are not available, use alcohol-based hand sanitizer.  Cough or sneeze into a tissue, sleeve, or elbow. Do not cough or sneeze into your hand or the air.  If you cough or sneeze into a tissue, throw it away immediately and wash your hands.  Stay home unless you must get medical care. Call your health care provider or local health authority before you get medical care.  Avoid public areas. Do not take public transportation, if possible.  If you can, wear a mask if you must go out of the house or if you are in close contact with someone who is not sick. Make sure your mask covers your nose and mouth. Keep your home clean  Disinfect objects and surfaces that are frequently touched every day. This may include: ? Counters and tables. ? Doorknobs and light switches. ? Sinks and faucets. ? Electronics such as phones, remote controls, keyboards, computers, and tablets.  Wash dishes in hot, soapy water or use a dishwasher. Air-dry your dishes.  Wash laundry in hot water. Prevent infecting other household members  Let healthy household members care for children and pets, if possible. If you have to care for children or  pets, wash your hands often and wear a mask.  Sleep in a different bedroom or bed, if possible.  Do not share personal items, such as razors, toothbrushes, deodorant, combs, brushes, towels, and washcloths. Where to find more information Centers for Disease Control and Prevention (CDC)  Information and news updates: https://www.butler-gonzalez.com/ World Health Organization [employer])  Information and news updates: MissExecutive.com.ee  Coronavirus health topic: https://www.castaneda.info/  Questions and answers on COVID-19: OpportunityDebt.at  Global tracker: who.sprinklr.com American Academy of Pediatrics (AAP)  Information for families: www.healthychildren.org/English/health-issues/conditions/chest-lungs/Pages/2019-Novel-Coronavirus.aspx The coronavirus situation is changing rapidly. Check your local health authority website or the CDC and [facility] websites for updates and news. When should I contact a health care provider?  Contact your health care provider if you have symptoms of an infection, such as fever or cough, and you: ? Have been near anyone who is known to have coronavirus disease. ? Have come into contact with a person who is suspected to have coronavirus disease. ? Have traveled to an  area where there is an outbreak of COVID-19. When should I get emergency medical care?  Get help right away by calling your local emergency services (911 in the [place] if you have: ? Trouble breathing. ? Pain or pressure in your chest. ? Confusion. ? Blue-tinged lips and fingernails. ? Difficulty waking from sleep. ? Symptoms that get worse. Let the emergency medical personnel know if you think you have coronavirus disease. Summary  A new respiratory virus is spreading from person to person and causing COVID-19 (coronavirus disease).  The virus that causes COVID-19 appears to spread easily. It spreads from one  person to another through droplets from breathing, speaking, singing, coughing, or sneezing.  Older adults and those with chronic diseases are at higher risk of disease. If you are at higher risk for complications, take extra precautions.  There is currently no vaccine to prevent coronavirus disease. There are no medicines, such as antibiotics or antivirals, to treat the virus.  You can protect yourself and your family by washing your hands often, avoiding touching your face, and covering your coughs and sneezes. This information is not intended to replace advice given to you by your health care provider. Make sure you discuss any questions you have with your health care provider. Document Revised: 01/15/2019 Document Reviewed: 07/14/2018 Elsevier Patient Education  [place].

## 2019-04-22 NOTE — TOC Transition Note (Signed)
Transition of Care [facility]) - CM/SW Discharge Note   Patient Details  Name: Jade Sullivan MRN: [id number] Date of Birth: [birth year]  Transition of [facility]) CM/SW Contact:  Shade Flood, LCSW Phone Number: 04/22/2019, 1:03 PM   Clinical Narrative:     Pt stable for dc per MD. Arranged HH with [facility]. Arranged RW and O2 with [employer]. Updated RN on saturation note needed. [facility] will deliver the [facility] portable concentrator to the hospital and pt can dc home once she has it. Asked AC to bring RW and etank from the [employer] closet to pt for dc as well.  Updated RN. There are no other TOC needs for dc.  Final next level of care: [place] Barriers to Discharge: Barriers Resolved   Patient Goals and CMS Choice        Discharge Placement                       Discharge Plan and Services In-house Referral: Clinical Social Work   Post Acute Care Choice: Durable Medical Equipment, Home Health          DME Arranged: Walker rolling, Oxygen DME Agency: [employer] Date DME Agency Contacted: 04/22/19 Time DME Agency Contacted: [id number] Representative spoke with at DME Agency: [place]: PT, OT [place] Agency: [place] Date [place]: 04/22/19 Time [place]: [id number] Representative spoke with at [place]: [place] ([place]) Interventions     Readmission Risk Interventions No flowsheet data found.

## 2019-04-22 NOTE — Progress Notes (Signed)
Ambulated patient in the halls for about 90-100 feet. She held sats of 95-99% for the first 20 feet on room air. Sats then dropped to 87-85% on room air while continuing to ambulate. Once on 2L of oxygen, her sats climbed to 91-92% with the activity and then back to 95-98% in room air at rest. Patient never felt short of breath.

## 2019-04-23 DIAGNOSIS — M170 Bilateral primary osteoarthritis of knee: Secondary | ICD-10-CM | POA: Diagnosis not present

## 2019-04-23 DIAGNOSIS — I5032 Chronic diastolic (congestive) heart failure: Secondary | ICD-10-CM | POA: Diagnosis not present

## 2019-04-23 DIAGNOSIS — Z8739 Personal history of other diseases of the musculoskeletal system and connective tissue: Secondary | ICD-10-CM | POA: Diagnosis not present

## 2019-04-23 DIAGNOSIS — E1122 Type 2 diabetes mellitus with diabetic chronic kidney disease: Secondary | ICD-10-CM | POA: Diagnosis not present

## 2019-04-23 DIAGNOSIS — I130 Hypertensive heart and chronic kidney disease with heart failure and stage 1 through stage 4 chronic kidney disease, or unspecified chronic kidney disease: Secondary | ICD-10-CM | POA: Diagnosis not present

## 2019-04-23 DIAGNOSIS — N183 Chronic kidney disease, stage 3 unspecified: Secondary | ICD-10-CM | POA: Diagnosis not present

## 2019-04-23 DIAGNOSIS — E039 Hypothyroidism, unspecified: Secondary | ICD-10-CM | POA: Diagnosis not present

## 2019-04-23 DIAGNOSIS — Z7982 Long term (current) use of aspirin: Secondary | ICD-10-CM | POA: Diagnosis not present

## 2019-04-23 DIAGNOSIS — I083 Combined rheumatic disorders of mitral, aortic and tricuspid valves: Secondary | ICD-10-CM | POA: Diagnosis not present

## 2019-04-23 DIAGNOSIS — Z9981 Dependence on supplemental oxygen: Secondary | ICD-10-CM | POA: Diagnosis not present

## 2019-04-23 DIAGNOSIS — Z7984 Long term (current) use of oral hypoglycemic drugs: Secondary | ICD-10-CM | POA: Diagnosis not present

## 2019-04-23 DIAGNOSIS — J9601 Acute respiratory failure with hypoxia: Secondary | ICD-10-CM | POA: Diagnosis not present

## 2019-04-23 DIAGNOSIS — E785 Hyperlipidemia, unspecified: Secondary | ICD-10-CM | POA: Diagnosis not present

## 2019-04-26 ENCOUNTER — Telehealth: Payer: Self-pay | Admitting: Internal Medicine

## 2019-04-26 NOTE — Telephone Encounter (Signed)
Requesting Verbal OK to begin PT 1x week for 1 week                                      2x week for 4 week                                      1x week for 2 week For strengthening, safe transfers, gate balance training, home exercise program, and home safety.

## 2019-04-26 NOTE — Telephone Encounter (Signed)
Fine

## 2019-04-26 NOTE — Telephone Encounter (Signed)
Verbal orders given  

## 2019-04-27 ENCOUNTER — Telehealth: Payer: Self-pay

## 2019-04-27 DIAGNOSIS — E785 Hyperlipidemia, unspecified: Secondary | ICD-10-CM | POA: Diagnosis not present

## 2019-04-27 DIAGNOSIS — J9601 Acute respiratory failure with hypoxia: Secondary | ICD-10-CM | POA: Diagnosis not present

## 2019-04-27 DIAGNOSIS — E1122 Type 2 diabetes mellitus with diabetic chronic kidney disease: Secondary | ICD-10-CM | POA: Diagnosis not present

## 2019-04-27 DIAGNOSIS — I083 Combined rheumatic disorders of mitral, aortic and tricuspid valves: Secondary | ICD-10-CM | POA: Diagnosis not present

## 2019-04-27 DIAGNOSIS — M170 Bilateral primary osteoarthritis of knee: Secondary | ICD-10-CM | POA: Diagnosis not present

## 2019-04-27 DIAGNOSIS — Z7984 Long term (current) use of oral hypoglycemic drugs: Secondary | ICD-10-CM | POA: Diagnosis not present

## 2019-04-27 DIAGNOSIS — Z8739 Personal history of other diseases of the musculoskeletal system and connective tissue: Secondary | ICD-10-CM | POA: Diagnosis not present

## 2019-04-27 DIAGNOSIS — I130 Hypertensive heart and chronic kidney disease with heart failure and stage 1 through stage 4 chronic kidney disease, or unspecified chronic kidney disease: Secondary | ICD-10-CM | POA: Diagnosis not present

## 2019-04-27 DIAGNOSIS — Z9981 Dependence on supplemental oxygen: Secondary | ICD-10-CM | POA: Diagnosis not present

## 2019-04-27 DIAGNOSIS — I5032 Chronic diastolic (congestive) heart failure: Secondary | ICD-10-CM | POA: Diagnosis not present

## 2019-04-27 DIAGNOSIS — N183 Chronic kidney disease, stage 3 unspecified: Secondary | ICD-10-CM | POA: Diagnosis not present

## 2019-04-27 DIAGNOSIS — E039 Hypothyroidism, unspecified: Secondary | ICD-10-CM | POA: Diagnosis not present

## 2019-04-27 DIAGNOSIS — Z7982 Long term (current) use of aspirin: Secondary | ICD-10-CM | POA: Diagnosis not present

## 2019-04-27 NOTE — Telephone Encounter (Signed)
Is this ok ? Please advise

## 2019-04-27 NOTE — Telephone Encounter (Signed)
New message     Verbal order home health OT for 2 x weeks for 2 weeks then 1 x weeks for 3 weeks.

## 2019-04-27 NOTE — Telephone Encounter (Signed)
Fine

## 2019-04-27 NOTE — Telephone Encounter (Signed)
Verbal orders given  

## 2019-04-29 DIAGNOSIS — Z7982 Long term (current) use of aspirin: Secondary | ICD-10-CM | POA: Diagnosis not present

## 2019-04-29 DIAGNOSIS — E1122 Type 2 diabetes mellitus with diabetic chronic kidney disease: Secondary | ICD-10-CM | POA: Diagnosis not present

## 2019-04-29 DIAGNOSIS — Z9981 Dependence on supplemental oxygen: Secondary | ICD-10-CM | POA: Diagnosis not present

## 2019-04-29 DIAGNOSIS — N183 Chronic kidney disease, stage 3 unspecified: Secondary | ICD-10-CM | POA: Diagnosis not present

## 2019-04-29 DIAGNOSIS — I5032 Chronic diastolic (congestive) heart failure: Secondary | ICD-10-CM | POA: Diagnosis not present

## 2019-04-29 DIAGNOSIS — J9601 Acute respiratory failure with hypoxia: Secondary | ICD-10-CM | POA: Diagnosis not present

## 2019-04-29 DIAGNOSIS — E785 Hyperlipidemia, unspecified: Secondary | ICD-10-CM | POA: Diagnosis not present

## 2019-04-29 DIAGNOSIS — I130 Hypertensive heart and chronic kidney disease with heart failure and stage 1 through stage 4 chronic kidney disease, or unspecified chronic kidney disease: Secondary | ICD-10-CM | POA: Diagnosis not present

## 2019-04-29 DIAGNOSIS — I083 Combined rheumatic disorders of mitral, aortic and tricuspid valves: Secondary | ICD-10-CM | POA: Diagnosis not present

## 2019-04-29 DIAGNOSIS — Z7984 Long term (current) use of oral hypoglycemic drugs: Secondary | ICD-10-CM | POA: Diagnosis not present

## 2019-04-29 DIAGNOSIS — E039 Hypothyroidism, unspecified: Secondary | ICD-10-CM | POA: Diagnosis not present

## 2019-04-29 DIAGNOSIS — M170 Bilateral primary osteoarthritis of knee: Secondary | ICD-10-CM | POA: Diagnosis not present

## 2019-04-29 DIAGNOSIS — Z8739 Personal history of other diseases of the musculoskeletal system and connective tissue: Secondary | ICD-10-CM | POA: Diagnosis not present

## 2019-04-30 DIAGNOSIS — Z8739 Personal history of other diseases of the musculoskeletal system and connective tissue: Secondary | ICD-10-CM | POA: Diagnosis not present

## 2019-04-30 DIAGNOSIS — E785 Hyperlipidemia, unspecified: Secondary | ICD-10-CM | POA: Diagnosis not present

## 2019-04-30 DIAGNOSIS — Z9981 Dependence on supplemental oxygen: Secondary | ICD-10-CM | POA: Diagnosis not present

## 2019-04-30 DIAGNOSIS — Z7982 Long term (current) use of aspirin: Secondary | ICD-10-CM | POA: Diagnosis not present

## 2019-04-30 DIAGNOSIS — I5032 Chronic diastolic (congestive) heart failure: Secondary | ICD-10-CM | POA: Diagnosis not present

## 2019-04-30 DIAGNOSIS — E1122 Type 2 diabetes mellitus with diabetic chronic kidney disease: Secondary | ICD-10-CM | POA: Diagnosis not present

## 2019-04-30 DIAGNOSIS — J9601 Acute respiratory failure with hypoxia: Secondary | ICD-10-CM | POA: Diagnosis not present

## 2019-04-30 DIAGNOSIS — N183 Chronic kidney disease, stage 3 unspecified: Secondary | ICD-10-CM | POA: Diagnosis not present

## 2019-04-30 DIAGNOSIS — M170 Bilateral primary osteoarthritis of knee: Secondary | ICD-10-CM | POA: Diagnosis not present

## 2019-04-30 DIAGNOSIS — I130 Hypertensive heart and chronic kidney disease with heart failure and stage 1 through stage 4 chronic kidney disease, or unspecified chronic kidney disease: Secondary | ICD-10-CM | POA: Diagnosis not present

## 2019-04-30 DIAGNOSIS — E039 Hypothyroidism, unspecified: Secondary | ICD-10-CM | POA: Diagnosis not present

## 2019-04-30 DIAGNOSIS — I083 Combined rheumatic disorders of mitral, aortic and tricuspid valves: Secondary | ICD-10-CM | POA: Diagnosis not present

## 2019-04-30 DIAGNOSIS — Z7984 Long term (current) use of oral hypoglycemic drugs: Secondary | ICD-10-CM | POA: Diagnosis not present

## 2019-05-03 ENCOUNTER — Encounter (HOSPITAL_COMMUNITY)
Admission: RE | Admit: 2019-05-03 | Discharge: 2019-05-03 | Disposition: A | Discharge status: Home / Self Care | Payer: Medicare Other | Source: Ambulatory Visit | Attending: Internal Medicine | Admitting: Internal Medicine

## 2019-05-03 DIAGNOSIS — E039 Hypothyroidism, unspecified: Secondary | ICD-10-CM | POA: Diagnosis not present

## 2019-05-03 DIAGNOSIS — N183 Chronic kidney disease, stage 3 unspecified: Secondary | ICD-10-CM | POA: Diagnosis not present

## 2019-05-03 DIAGNOSIS — I130 Hypertensive heart and chronic kidney disease with heart failure and stage 1 through stage 4 chronic kidney disease, or unspecified chronic kidney disease: Secondary | ICD-10-CM | POA: Diagnosis not present

## 2019-05-03 DIAGNOSIS — Z8739 Personal history of other diseases of the musculoskeletal system and connective tissue: Secondary | ICD-10-CM | POA: Diagnosis not present

## 2019-05-03 DIAGNOSIS — I083 Combined rheumatic disorders of mitral, aortic and tricuspid valves: Secondary | ICD-10-CM | POA: Diagnosis not present

## 2019-05-03 DIAGNOSIS — Z9981 Dependence on supplemental oxygen: Secondary | ICD-10-CM | POA: Diagnosis not present

## 2019-05-03 DIAGNOSIS — Z029 Encounter for administrative examinations, unspecified: Secondary | ICD-10-CM | POA: Insufficient documentation

## 2019-05-03 DIAGNOSIS — E785 Hyperlipidemia, unspecified: Secondary | ICD-10-CM | POA: Diagnosis not present

## 2019-05-03 DIAGNOSIS — Z7984 Long term (current) use of oral hypoglycemic drugs: Secondary | ICD-10-CM | POA: Diagnosis not present

## 2019-05-03 DIAGNOSIS — I5032 Chronic diastolic (congestive) heart failure: Secondary | ICD-10-CM | POA: Diagnosis not present

## 2019-05-03 DIAGNOSIS — E1122 Type 2 diabetes mellitus with diabetic chronic kidney disease: Secondary | ICD-10-CM | POA: Diagnosis not present

## 2019-05-03 DIAGNOSIS — J9601 Acute respiratory failure with hypoxia: Secondary | ICD-10-CM | POA: Diagnosis not present

## 2019-05-03 DIAGNOSIS — Z7982 Long term (current) use of aspirin: Secondary | ICD-10-CM | POA: Diagnosis not present

## 2019-05-03 DIAGNOSIS — M170 Bilateral primary osteoarthritis of knee: Secondary | ICD-10-CM | POA: Diagnosis not present

## 2019-05-03 LAB — CBC WITH DIFFERENTIAL/PLATELET
Abs Immature Granulocytes: 0.02 10*3/uL (ref 0.00–0.07)
Basophils Absolute: 0 10*3/uL (ref 0.0–0.1)
Basophils Relative: 0 %
Eosinophils Absolute: 0.1 10*3/uL (ref 0.0–0.5)
Eosinophils Relative: 2 %
HCT: 35 % — ABNORMAL LOW (ref 36.0–46.0)
Hemoglobin: 11.3 g/dL — ABNORMAL LOW (ref 12.0–15.0)
Immature Granulocytes: 1 %
Lymphocytes Relative: 26 %
Lymphs Abs: 1 10*3/uL (ref 0.7–4.0)
MCH: 30.8 pg (ref 26.0–34.0)
MCHC: 32.3 g/dL (ref 30.0–36.0)
MCV: 95.4 fL (ref 80.0–100.0)
Monocytes Absolute: 0.3 10*3/uL (ref 0.1–1.0)
Monocytes Relative: 7 %
Neutro Abs: 2.5 10*3/uL (ref 1.7–7.7)
Neutrophils Relative %: 64 %
Platelets: 75 10*3/uL — ABNORMAL LOW (ref 150–400)
RBC: 3.67 MIL/uL — ABNORMAL LOW (ref 3.87–5.11)
RDW: 15.6 % — ABNORMAL HIGH (ref 11.5–15.5)
WBC: 3.8 10*3/uL — ABNORMAL LOW (ref 4.0–10.5)
nRBC: 0 % (ref 0.0–0.2)

## 2019-05-03 LAB — BASIC METABOLIC PANEL
Anion gap: 7 (ref 5–15)
BUN: 14 mg/dL (ref 8–23)
CO2: 25 mmol/L (ref 22–32)
Calcium: 9.1 mg/dL (ref 8.9–10.3)
Chloride: 110 mmol/L (ref 98–111)
Creatinine, Ser: 1.21 mg/dL — ABNORMAL HIGH (ref 0.44–1.00)
GFR calc Af Amer: 51 mL/min — ABNORMAL LOW (ref >60)
GFR calc non Af Amer: 44 mL/min — ABNORMAL LOW (ref >60)
Glucose, Bld: 185 mg/dL — ABNORMAL HIGH (ref 70–99)
Potassium: 4.4 mmol/L (ref 3.5–5.1)
Sodium: 142 mmol/L (ref 135–145)

## 2019-05-04 DIAGNOSIS — Z7984 Long term (current) use of oral hypoglycemic drugs: Secondary | ICD-10-CM | POA: Diagnosis not present

## 2019-05-04 DIAGNOSIS — N183 Chronic kidney disease, stage 3 unspecified: Secondary | ICD-10-CM | POA: Diagnosis not present

## 2019-05-04 DIAGNOSIS — J9601 Acute respiratory failure with hypoxia: Secondary | ICD-10-CM | POA: Diagnosis not present

## 2019-05-04 DIAGNOSIS — I083 Combined rheumatic disorders of mitral, aortic and tricuspid valves: Secondary | ICD-10-CM | POA: Diagnosis not present

## 2019-05-04 DIAGNOSIS — I130 Hypertensive heart and chronic kidney disease with heart failure and stage 1 through stage 4 chronic kidney disease, or unspecified chronic kidney disease: Secondary | ICD-10-CM | POA: Diagnosis not present

## 2019-05-04 DIAGNOSIS — Z9981 Dependence on supplemental oxygen: Secondary | ICD-10-CM | POA: Diagnosis not present

## 2019-05-04 DIAGNOSIS — E039 Hypothyroidism, unspecified: Secondary | ICD-10-CM | POA: Diagnosis not present

## 2019-05-04 DIAGNOSIS — I5032 Chronic diastolic (congestive) heart failure: Secondary | ICD-10-CM | POA: Diagnosis not present

## 2019-05-04 DIAGNOSIS — Z7982 Long term (current) use of aspirin: Secondary | ICD-10-CM | POA: Diagnosis not present

## 2019-05-04 DIAGNOSIS — E1122 Type 2 diabetes mellitus with diabetic chronic kidney disease: Secondary | ICD-10-CM | POA: Diagnosis not present

## 2019-05-04 DIAGNOSIS — E785 Hyperlipidemia, unspecified: Secondary | ICD-10-CM | POA: Diagnosis not present

## 2019-05-04 DIAGNOSIS — Z8739 Personal history of other diseases of the musculoskeletal system and connective tissue: Secondary | ICD-10-CM | POA: Diagnosis not present

## 2019-05-04 DIAGNOSIS — M170 Bilateral primary osteoarthritis of knee: Secondary | ICD-10-CM | POA: Diagnosis not present

## 2019-05-05 DIAGNOSIS — J9601 Acute respiratory failure with hypoxia: Secondary | ICD-10-CM | POA: Diagnosis not present

## 2019-05-05 DIAGNOSIS — E039 Hypothyroidism, unspecified: Secondary | ICD-10-CM | POA: Diagnosis not present

## 2019-05-05 DIAGNOSIS — E785 Hyperlipidemia, unspecified: Secondary | ICD-10-CM | POA: Diagnosis not present

## 2019-05-05 DIAGNOSIS — Z9981 Dependence on supplemental oxygen: Secondary | ICD-10-CM | POA: Diagnosis not present

## 2019-05-05 DIAGNOSIS — Z7982 Long term (current) use of aspirin: Secondary | ICD-10-CM | POA: Diagnosis not present

## 2019-05-05 DIAGNOSIS — I083 Combined rheumatic disorders of mitral, aortic and tricuspid valves: Secondary | ICD-10-CM | POA: Diagnosis not present

## 2019-05-05 DIAGNOSIS — I5032 Chronic diastolic (congestive) heart failure: Secondary | ICD-10-CM | POA: Diagnosis not present

## 2019-05-05 DIAGNOSIS — M170 Bilateral primary osteoarthritis of knee: Secondary | ICD-10-CM | POA: Diagnosis not present

## 2019-05-05 DIAGNOSIS — N183 Chronic kidney disease, stage 3 unspecified: Secondary | ICD-10-CM | POA: Diagnosis not present

## 2019-05-05 DIAGNOSIS — I130 Hypertensive heart and chronic kidney disease with heart failure and stage 1 through stage 4 chronic kidney disease, or unspecified chronic kidney disease: Secondary | ICD-10-CM | POA: Diagnosis not present

## 2019-05-05 DIAGNOSIS — Z7984 Long term (current) use of oral hypoglycemic drugs: Secondary | ICD-10-CM | POA: Diagnosis not present

## 2019-05-05 DIAGNOSIS — E1122 Type 2 diabetes mellitus with diabetic chronic kidney disease: Secondary | ICD-10-CM | POA: Diagnosis not present

## 2019-05-05 DIAGNOSIS — Z8739 Personal history of other diseases of the musculoskeletal system and connective tissue: Secondary | ICD-10-CM | POA: Diagnosis not present

## 2019-05-06 ENCOUNTER — Ambulatory Visit (INDEPENDENT_AMBULATORY_CARE_PROVIDER_SITE_OTHER): Payer: Medicare Other | Admitting: Internal Medicine

## 2019-05-06 ENCOUNTER — Encounter: Payer: Self-pay | Admitting: Internal Medicine

## 2019-05-06 DIAGNOSIS — E1122 Type 2 diabetes mellitus with diabetic chronic kidney disease: Secondary | ICD-10-CM | POA: Diagnosis not present

## 2019-05-06 DIAGNOSIS — E785 Hyperlipidemia, unspecified: Secondary | ICD-10-CM | POA: Diagnosis not present

## 2019-05-06 DIAGNOSIS — Z7984 Long term (current) use of oral hypoglycemic drugs: Secondary | ICD-10-CM | POA: Diagnosis not present

## 2019-05-06 DIAGNOSIS — I3139 Other pericardial effusion (noninflammatory): Secondary | ICD-10-CM | POA: Insufficient documentation

## 2019-05-06 DIAGNOSIS — J1282 Pneumonia due to coronavirus disease 2019: Secondary | ICD-10-CM

## 2019-05-06 DIAGNOSIS — E039 Hypothyroidism, unspecified: Secondary | ICD-10-CM | POA: Diagnosis not present

## 2019-05-06 DIAGNOSIS — Z8739 Personal history of other diseases of the musculoskeletal system and connective tissue: Secondary | ICD-10-CM | POA: Diagnosis not present

## 2019-05-06 DIAGNOSIS — J9601 Acute respiratory failure with hypoxia: Secondary | ICD-10-CM

## 2019-05-06 DIAGNOSIS — Z7982 Long term (current) use of aspirin: Secondary | ICD-10-CM | POA: Diagnosis not present

## 2019-05-06 DIAGNOSIS — M170 Bilateral primary osteoarthritis of knee: Secondary | ICD-10-CM | POA: Diagnosis not present

## 2019-05-06 DIAGNOSIS — Z9981 Dependence on supplemental oxygen: Secondary | ICD-10-CM | POA: Diagnosis not present

## 2019-05-06 DIAGNOSIS — I5032 Chronic diastolic (congestive) heart failure: Secondary | ICD-10-CM | POA: Diagnosis not present

## 2019-05-06 DIAGNOSIS — U071 COVID-19: Secondary | ICD-10-CM | POA: Diagnosis not present

## 2019-05-06 DIAGNOSIS — I313 Pericardial effusion (noninflammatory): Secondary | ICD-10-CM | POA: Diagnosis not present

## 2019-05-06 DIAGNOSIS — N183 Chronic kidney disease, stage 3 unspecified: Secondary | ICD-10-CM | POA: Diagnosis not present

## 2019-05-06 DIAGNOSIS — I083 Combined rheumatic disorders of mitral, aortic and tricuspid valves: Secondary | ICD-10-CM | POA: Diagnosis not present

## 2019-05-06 DIAGNOSIS — I130 Hypertensive heart and chronic kidney disease with heart failure and stage 1 through stage 4 chronic kidney disease, or unspecified chronic kidney disease: Secondary | ICD-10-CM | POA: Diagnosis not present

## 2019-05-06 MED ORDER — ATORVASTATIN CALCIUM 20 MG PO TABS: 20.0000 mg | ORAL_TABLET | Freq: Every day | ORAL | 3 refills | Status: DC

## 2019-05-06 MED ORDER — LEVOTHYROXINE SODIUM 50 MCG PO TABS: 50.0000 ug | ORAL_TABLET | Freq: Every day | ORAL | 3 refills | Status: DC

## 2019-05-06 MED ORDER — LOSARTAN POTASSIUM 100 MG PO TABS: 100.0000 mg | ORAL_TABLET | Freq: Every day | ORAL | 3 refills | Status: DC

## 2019-05-06 NOTE — Assessment & Plan Note (Signed)
Needs repeat CXR at visit 05/20/19

## 2019-05-06 NOTE — Assessment & Plan Note (Signed)
Unclear if this is related to acute illness. Ordered echo complete today to assess for stability.

## 2019-05-06 NOTE — Progress Notes (Signed)
Virtual Visit via Video Note  I connected with Jade Sullivan on 05/06/19 at 10:00 AM EST by a video enabled telemedicine application and verified that I am speaking with the correct person using two identifiers.  The patient and the provider were at separate locations throughout the entire encounter.   I discussed the limitations of evaluation and management by telemedicine and the availability of in person appointments. The patient expressed understanding and agreed to proceed. The patient and the provider were the only parties present for the visit unless noted in HPI below.  History of Present Illness: The patient is a 74 y.o. female with visit for hospital follow up. Was admitted for respiratory failure with covid-19. Given steroids and remdisivir. Overall is much better than when in hospital. Still using oxygen at home. Some SOB with activity. Denies cough. Denies nausea or vomiting or diarrhea. Has been improving with energy but very slowly. Has tried as above.  Observations/Objective: Appearance: normal, breathing appears normal at rest, with walking some dyspnea, no coughing during visit, casual grooming, abdomen does not appear distended, throat not well visualized, memory normal, mental status is A and O times 3  Assessment and Plan: See problem oriented charting  Follow Up Instructions: keep visit in office for 05/20/19 for assessment of oxygen status, repeat CXR and needs ECHO ordered now to reassess pericardial effusion  I discussed the assessment and treatment plan with the patient. The patient was provided an opportunity to ask questions and all were answered. The patient agreed with the plan and demonstrated an understanding of the instructions.   The patient was advised to call back or seek an in-person evaluation if the symptoms worsen or if the condition fails to improve as anticipated.  Hoyt Koch, MD

## 2019-05-06 NOTE — Assessment & Plan Note (Signed)
At visit in person 05/19/18 will assess oxygen requirement. Is still using oxygen for walking around at home currently 2 L.

## 2019-05-06 NOTE — Assessment & Plan Note (Signed)
Overall recovering but ended up in hospital and is still recovering but slowly. Advised that this could take up to several more weeks to months for full recovery.

## 2019-05-07 DIAGNOSIS — Z8739 Personal history of other diseases of the musculoskeletal system and connective tissue: Secondary | ICD-10-CM | POA: Diagnosis not present

## 2019-05-07 DIAGNOSIS — Z7984 Long term (current) use of oral hypoglycemic drugs: Secondary | ICD-10-CM | POA: Diagnosis not present

## 2019-05-07 DIAGNOSIS — I130 Hypertensive heart and chronic kidney disease with heart failure and stage 1 through stage 4 chronic kidney disease, or unspecified chronic kidney disease: Secondary | ICD-10-CM | POA: Diagnosis not present

## 2019-05-07 DIAGNOSIS — I5032 Chronic diastolic (congestive) heart failure: Secondary | ICD-10-CM | POA: Diagnosis not present

## 2019-05-07 DIAGNOSIS — E669 Obesity, unspecified: Secondary | ICD-10-CM

## 2019-05-07 DIAGNOSIS — J1282 Pneumonia due to coronavirus disease 2019: Secondary | ICD-10-CM | POA: Diagnosis not present

## 2019-05-07 DIAGNOSIS — N183 Chronic kidney disease, stage 3 unspecified: Secondary | ICD-10-CM | POA: Diagnosis not present

## 2019-05-07 DIAGNOSIS — U071 COVID-19: Secondary | ICD-10-CM | POA: Diagnosis not present

## 2019-05-07 DIAGNOSIS — E039 Hypothyroidism, unspecified: Secondary | ICD-10-CM | POA: Diagnosis not present

## 2019-05-07 DIAGNOSIS — E1122 Type 2 diabetes mellitus with diabetic chronic kidney disease: Secondary | ICD-10-CM | POA: Diagnosis not present

## 2019-05-07 DIAGNOSIS — I083 Combined rheumatic disorders of mitral, aortic and tricuspid valves: Secondary | ICD-10-CM | POA: Diagnosis not present

## 2019-05-07 DIAGNOSIS — M170 Bilateral primary osteoarthritis of knee: Secondary | ICD-10-CM | POA: Diagnosis not present

## 2019-05-07 DIAGNOSIS — E785 Hyperlipidemia, unspecified: Secondary | ICD-10-CM | POA: Diagnosis not present

## 2019-05-07 DIAGNOSIS — Z6835 Body mass index (BMI) 35.0-35.9, adult: Secondary | ICD-10-CM

## 2019-05-07 DIAGNOSIS — Z7982 Long term (current) use of aspirin: Secondary | ICD-10-CM | POA: Diagnosis not present

## 2019-05-07 DIAGNOSIS — J9601 Acute respiratory failure with hypoxia: Secondary | ICD-10-CM | POA: Diagnosis not present

## 2019-05-07 DIAGNOSIS — Z9981 Dependence on supplemental oxygen: Secondary | ICD-10-CM | POA: Diagnosis not present

## 2019-05-10 DIAGNOSIS — Z7982 Long term (current) use of aspirin: Secondary | ICD-10-CM | POA: Diagnosis not present

## 2019-05-10 DIAGNOSIS — Z7984 Long term (current) use of oral hypoglycemic drugs: Secondary | ICD-10-CM | POA: Diagnosis not present

## 2019-05-10 DIAGNOSIS — M170 Bilateral primary osteoarthritis of knee: Secondary | ICD-10-CM | POA: Diagnosis not present

## 2019-05-10 DIAGNOSIS — N183 Chronic kidney disease, stage 3 unspecified: Secondary | ICD-10-CM | POA: Diagnosis not present

## 2019-05-10 DIAGNOSIS — E785 Hyperlipidemia, unspecified: Secondary | ICD-10-CM | POA: Diagnosis not present

## 2019-05-10 DIAGNOSIS — J9601 Acute respiratory failure with hypoxia: Secondary | ICD-10-CM | POA: Diagnosis not present

## 2019-05-10 DIAGNOSIS — E039 Hypothyroidism, unspecified: Secondary | ICD-10-CM | POA: Diagnosis not present

## 2019-05-10 DIAGNOSIS — I130 Hypertensive heart and chronic kidney disease with heart failure and stage 1 through stage 4 chronic kidney disease, or unspecified chronic kidney disease: Secondary | ICD-10-CM | POA: Diagnosis not present

## 2019-05-10 DIAGNOSIS — E1122 Type 2 diabetes mellitus with diabetic chronic kidney disease: Secondary | ICD-10-CM | POA: Diagnosis not present

## 2019-05-10 DIAGNOSIS — I5032 Chronic diastolic (congestive) heart failure: Secondary | ICD-10-CM | POA: Diagnosis not present

## 2019-05-10 DIAGNOSIS — I083 Combined rheumatic disorders of mitral, aortic and tricuspid valves: Secondary | ICD-10-CM | POA: Diagnosis not present

## 2019-05-10 DIAGNOSIS — Z9981 Dependence on supplemental oxygen: Secondary | ICD-10-CM | POA: Diagnosis not present

## 2019-05-10 DIAGNOSIS — Z8739 Personal history of other diseases of the musculoskeletal system and connective tissue: Secondary | ICD-10-CM | POA: Diagnosis not present

## 2019-05-11 ENCOUNTER — Other Ambulatory Visit: Payer: Self-pay

## 2019-05-11 DIAGNOSIS — I130 Hypertensive heart and chronic kidney disease with heart failure and stage 1 through stage 4 chronic kidney disease, or unspecified chronic kidney disease: Secondary | ICD-10-CM | POA: Diagnosis not present

## 2019-05-11 DIAGNOSIS — Z7984 Long term (current) use of oral hypoglycemic drugs: Secondary | ICD-10-CM | POA: Diagnosis not present

## 2019-05-11 DIAGNOSIS — Z9981 Dependence on supplemental oxygen: Secondary | ICD-10-CM | POA: Diagnosis not present

## 2019-05-11 DIAGNOSIS — J9601 Acute respiratory failure with hypoxia: Secondary | ICD-10-CM | POA: Diagnosis not present

## 2019-05-11 DIAGNOSIS — I083 Combined rheumatic disorders of mitral, aortic and tricuspid valves: Secondary | ICD-10-CM | POA: Diagnosis not present

## 2019-05-11 DIAGNOSIS — M170 Bilateral primary osteoarthritis of knee: Secondary | ICD-10-CM | POA: Diagnosis not present

## 2019-05-11 DIAGNOSIS — Z8739 Personal history of other diseases of the musculoskeletal system and connective tissue: Secondary | ICD-10-CM | POA: Diagnosis not present

## 2019-05-11 DIAGNOSIS — E1122 Type 2 diabetes mellitus with diabetic chronic kidney disease: Secondary | ICD-10-CM | POA: Diagnosis not present

## 2019-05-11 DIAGNOSIS — I5032 Chronic diastolic (congestive) heart failure: Secondary | ICD-10-CM | POA: Diagnosis not present

## 2019-05-11 DIAGNOSIS — E039 Hypothyroidism, unspecified: Secondary | ICD-10-CM | POA: Diagnosis not present

## 2019-05-11 DIAGNOSIS — E785 Hyperlipidemia, unspecified: Secondary | ICD-10-CM | POA: Diagnosis not present

## 2019-05-11 DIAGNOSIS — Z7982 Long term (current) use of aspirin: Secondary | ICD-10-CM | POA: Diagnosis not present

## 2019-05-11 DIAGNOSIS — N183 Chronic kidney disease, stage 3 unspecified: Secondary | ICD-10-CM | POA: Diagnosis not present

## 2019-05-12 ENCOUNTER — Encounter: Payer: Medicare Other | Admitting: Obstetrics & Gynecology

## 2019-05-12 DIAGNOSIS — E039 Hypothyroidism, unspecified: Secondary | ICD-10-CM | POA: Diagnosis not present

## 2019-05-12 DIAGNOSIS — Z7982 Long term (current) use of aspirin: Secondary | ICD-10-CM | POA: Diagnosis not present

## 2019-05-12 DIAGNOSIS — J9601 Acute respiratory failure with hypoxia: Secondary | ICD-10-CM | POA: Diagnosis not present

## 2019-05-12 DIAGNOSIS — N183 Chronic kidney disease, stage 3 unspecified: Secondary | ICD-10-CM | POA: Diagnosis not present

## 2019-05-12 DIAGNOSIS — I130 Hypertensive heart and chronic kidney disease with heart failure and stage 1 through stage 4 chronic kidney disease, or unspecified chronic kidney disease: Secondary | ICD-10-CM | POA: Diagnosis not present

## 2019-05-12 DIAGNOSIS — Z9981 Dependence on supplemental oxygen: Secondary | ICD-10-CM | POA: Diagnosis not present

## 2019-05-12 DIAGNOSIS — I5032 Chronic diastolic (congestive) heart failure: Secondary | ICD-10-CM | POA: Diagnosis not present

## 2019-05-12 DIAGNOSIS — E785 Hyperlipidemia, unspecified: Secondary | ICD-10-CM | POA: Diagnosis not present

## 2019-05-12 DIAGNOSIS — Z7984 Long term (current) use of oral hypoglycemic drugs: Secondary | ICD-10-CM | POA: Diagnosis not present

## 2019-05-12 DIAGNOSIS — M170 Bilateral primary osteoarthritis of knee: Secondary | ICD-10-CM | POA: Diagnosis not present

## 2019-05-12 DIAGNOSIS — I083 Combined rheumatic disorders of mitral, aortic and tricuspid valves: Secondary | ICD-10-CM | POA: Diagnosis not present

## 2019-05-12 DIAGNOSIS — Z8739 Personal history of other diseases of the musculoskeletal system and connective tissue: Secondary | ICD-10-CM | POA: Diagnosis not present

## 2019-05-12 DIAGNOSIS — E1122 Type 2 diabetes mellitus with diabetic chronic kidney disease: Secondary | ICD-10-CM | POA: Diagnosis not present

## 2019-05-13 ENCOUNTER — Ambulatory Visit (HOSPITAL_COMMUNITY): Payer: Medicare Other | Attending: Cardiology

## 2019-05-13 ENCOUNTER — Other Ambulatory Visit: Payer: Self-pay

## 2019-05-13 DIAGNOSIS — I313 Pericardial effusion (noninflammatory): Secondary | ICD-10-CM | POA: Insufficient documentation

## 2019-05-13 DIAGNOSIS — I3139 Other pericardial effusion (noninflammatory): Secondary | ICD-10-CM

## 2019-05-14 DIAGNOSIS — Z7984 Long term (current) use of oral hypoglycemic drugs: Secondary | ICD-10-CM | POA: Diagnosis not present

## 2019-05-14 DIAGNOSIS — Z7982 Long term (current) use of aspirin: Secondary | ICD-10-CM | POA: Diagnosis not present

## 2019-05-14 DIAGNOSIS — N183 Chronic kidney disease, stage 3 unspecified: Secondary | ICD-10-CM | POA: Diagnosis not present

## 2019-05-14 DIAGNOSIS — E785 Hyperlipidemia, unspecified: Secondary | ICD-10-CM | POA: Diagnosis not present

## 2019-05-14 DIAGNOSIS — I5032 Chronic diastolic (congestive) heart failure: Secondary | ICD-10-CM | POA: Diagnosis not present

## 2019-05-14 DIAGNOSIS — E1122 Type 2 diabetes mellitus with diabetic chronic kidney disease: Secondary | ICD-10-CM | POA: Diagnosis not present

## 2019-05-14 DIAGNOSIS — Z8739 Personal history of other diseases of the musculoskeletal system and connective tissue: Secondary | ICD-10-CM | POA: Diagnosis not present

## 2019-05-14 DIAGNOSIS — E039 Hypothyroidism, unspecified: Secondary | ICD-10-CM | POA: Diagnosis not present

## 2019-05-14 DIAGNOSIS — J9601 Acute respiratory failure with hypoxia: Secondary | ICD-10-CM | POA: Diagnosis not present

## 2019-05-14 DIAGNOSIS — Z9981 Dependence on supplemental oxygen: Secondary | ICD-10-CM | POA: Diagnosis not present

## 2019-05-14 DIAGNOSIS — M170 Bilateral primary osteoarthritis of knee: Secondary | ICD-10-CM | POA: Diagnosis not present

## 2019-05-14 DIAGNOSIS — I083 Combined rheumatic disorders of mitral, aortic and tricuspid valves: Secondary | ICD-10-CM | POA: Diagnosis not present

## 2019-05-14 DIAGNOSIS — I130 Hypertensive heart and chronic kidney disease with heart failure and stage 1 through stage 4 chronic kidney disease, or unspecified chronic kidney disease: Secondary | ICD-10-CM | POA: Diagnosis not present

## 2019-05-17 DIAGNOSIS — M170 Bilateral primary osteoarthritis of knee: Secondary | ICD-10-CM | POA: Diagnosis not present

## 2019-05-17 DIAGNOSIS — Z7982 Long term (current) use of aspirin: Secondary | ICD-10-CM | POA: Diagnosis not present

## 2019-05-17 DIAGNOSIS — I5032 Chronic diastolic (congestive) heart failure: Secondary | ICD-10-CM | POA: Diagnosis not present

## 2019-05-17 DIAGNOSIS — E039 Hypothyroidism, unspecified: Secondary | ICD-10-CM | POA: Diagnosis not present

## 2019-05-17 DIAGNOSIS — Z8739 Personal history of other diseases of the musculoskeletal system and connective tissue: Secondary | ICD-10-CM | POA: Diagnosis not present

## 2019-05-17 DIAGNOSIS — Z7984 Long term (current) use of oral hypoglycemic drugs: Secondary | ICD-10-CM | POA: Diagnosis not present

## 2019-05-17 DIAGNOSIS — I130 Hypertensive heart and chronic kidney disease with heart failure and stage 1 through stage 4 chronic kidney disease, or unspecified chronic kidney disease: Secondary | ICD-10-CM | POA: Diagnosis not present

## 2019-05-17 DIAGNOSIS — E1122 Type 2 diabetes mellitus with diabetic chronic kidney disease: Secondary | ICD-10-CM | POA: Diagnosis not present

## 2019-05-17 DIAGNOSIS — J9601 Acute respiratory failure with hypoxia: Secondary | ICD-10-CM | POA: Diagnosis not present

## 2019-05-17 DIAGNOSIS — I083 Combined rheumatic disorders of mitral, aortic and tricuspid valves: Secondary | ICD-10-CM | POA: Diagnosis not present

## 2019-05-17 DIAGNOSIS — N183 Chronic kidney disease, stage 3 unspecified: Secondary | ICD-10-CM | POA: Diagnosis not present

## 2019-05-17 DIAGNOSIS — Z9981 Dependence on supplemental oxygen: Secondary | ICD-10-CM | POA: Diagnosis not present

## 2019-05-17 DIAGNOSIS — E785 Hyperlipidemia, unspecified: Secondary | ICD-10-CM | POA: Diagnosis not present

## 2019-05-18 DIAGNOSIS — Z9981 Dependence on supplemental oxygen: Secondary | ICD-10-CM | POA: Diagnosis not present

## 2019-05-18 DIAGNOSIS — Z7984 Long term (current) use of oral hypoglycemic drugs: Secondary | ICD-10-CM | POA: Diagnosis not present

## 2019-05-18 DIAGNOSIS — E785 Hyperlipidemia, unspecified: Secondary | ICD-10-CM | POA: Diagnosis not present

## 2019-05-18 DIAGNOSIS — I130 Hypertensive heart and chronic kidney disease with heart failure and stage 1 through stage 4 chronic kidney disease, or unspecified chronic kidney disease: Secondary | ICD-10-CM | POA: Diagnosis not present

## 2019-05-18 DIAGNOSIS — E039 Hypothyroidism, unspecified: Secondary | ICD-10-CM | POA: Diagnosis not present

## 2019-05-18 DIAGNOSIS — Z7982 Long term (current) use of aspirin: Secondary | ICD-10-CM | POA: Diagnosis not present

## 2019-05-18 DIAGNOSIS — M170 Bilateral primary osteoarthritis of knee: Secondary | ICD-10-CM | POA: Diagnosis not present

## 2019-05-18 DIAGNOSIS — E1122 Type 2 diabetes mellitus with diabetic chronic kidney disease: Secondary | ICD-10-CM | POA: Diagnosis not present

## 2019-05-18 DIAGNOSIS — I083 Combined rheumatic disorders of mitral, aortic and tricuspid valves: Secondary | ICD-10-CM | POA: Diagnosis not present

## 2019-05-18 DIAGNOSIS — Z8739 Personal history of other diseases of the musculoskeletal system and connective tissue: Secondary | ICD-10-CM | POA: Diagnosis not present

## 2019-05-18 DIAGNOSIS — J9601 Acute respiratory failure with hypoxia: Secondary | ICD-10-CM | POA: Diagnosis not present

## 2019-05-18 DIAGNOSIS — I5032 Chronic diastolic (congestive) heart failure: Secondary | ICD-10-CM | POA: Diagnosis not present

## 2019-05-18 DIAGNOSIS — N183 Chronic kidney disease, stage 3 unspecified: Secondary | ICD-10-CM | POA: Diagnosis not present

## 2019-05-19 DIAGNOSIS — I5032 Chronic diastolic (congestive) heart failure: Secondary | ICD-10-CM | POA: Diagnosis not present

## 2019-05-19 DIAGNOSIS — Z7982 Long term (current) use of aspirin: Secondary | ICD-10-CM | POA: Diagnosis not present

## 2019-05-19 DIAGNOSIS — I083 Combined rheumatic disorders of mitral, aortic and tricuspid valves: Secondary | ICD-10-CM | POA: Diagnosis not present

## 2019-05-19 DIAGNOSIS — Z7984 Long term (current) use of oral hypoglycemic drugs: Secondary | ICD-10-CM | POA: Diagnosis not present

## 2019-05-19 DIAGNOSIS — E785 Hyperlipidemia, unspecified: Secondary | ICD-10-CM | POA: Diagnosis not present

## 2019-05-19 DIAGNOSIS — N183 Chronic kidney disease, stage 3 unspecified: Secondary | ICD-10-CM | POA: Diagnosis not present

## 2019-05-19 DIAGNOSIS — E1122 Type 2 diabetes mellitus with diabetic chronic kidney disease: Secondary | ICD-10-CM | POA: Diagnosis not present

## 2019-05-19 DIAGNOSIS — Z9981 Dependence on supplemental oxygen: Secondary | ICD-10-CM | POA: Diagnosis not present

## 2019-05-19 DIAGNOSIS — I130 Hypertensive heart and chronic kidney disease with heart failure and stage 1 through stage 4 chronic kidney disease, or unspecified chronic kidney disease: Secondary | ICD-10-CM | POA: Diagnosis not present

## 2019-05-19 DIAGNOSIS — M170 Bilateral primary osteoarthritis of knee: Secondary | ICD-10-CM | POA: Diagnosis not present

## 2019-05-19 DIAGNOSIS — Z8739 Personal history of other diseases of the musculoskeletal system and connective tissue: Secondary | ICD-10-CM | POA: Diagnosis not present

## 2019-05-19 DIAGNOSIS — J9601 Acute respiratory failure with hypoxia: Secondary | ICD-10-CM | POA: Diagnosis not present

## 2019-05-19 DIAGNOSIS — E039 Hypothyroidism, unspecified: Secondary | ICD-10-CM | POA: Diagnosis not present

## 2019-05-20 ENCOUNTER — Ambulatory Visit: Payer: Medicare Other

## 2019-05-20 ENCOUNTER — Encounter: Payer: Medicare Other | Admitting: Internal Medicine

## 2019-05-21 ENCOUNTER — Telehealth: Payer: Self-pay

## 2019-05-21 MED ORDER — SITAGLIPTIN PHOSPHATE 50 MG PO TABS: 50.0000 mg | ORAL_TABLET | Freq: Every day | ORAL | 0 refills | Status: DC

## 2019-05-21 NOTE — Telephone Encounter (Signed)
Pt was prescribed this medication (JANUVIA) in the [facility] follow up was canceled.   Pt wanted to know if she should continue this medication. If so, she will need a refill.

## 2019-05-21 NOTE — Telephone Encounter (Signed)
Can have 30 day supply (metformin and januvia if needed), needs to reschedule in person visit for next few weeks to address if she needs this long term.

## 2019-05-24 ENCOUNTER — Encounter: Payer: Medicare Other | Admitting: Obstetrics & Gynecology

## 2019-05-24 DIAGNOSIS — E785 Hyperlipidemia, unspecified: Secondary | ICD-10-CM | POA: Diagnosis not present

## 2019-05-24 DIAGNOSIS — N183 Chronic kidney disease, stage 3 unspecified: Secondary | ICD-10-CM | POA: Diagnosis not present

## 2019-05-24 DIAGNOSIS — Z8739 Personal history of other diseases of the musculoskeletal system and connective tissue: Secondary | ICD-10-CM | POA: Diagnosis not present

## 2019-05-24 DIAGNOSIS — I5032 Chronic diastolic (congestive) heart failure: Secondary | ICD-10-CM | POA: Diagnosis not present

## 2019-05-24 DIAGNOSIS — Z9981 Dependence on supplemental oxygen: Secondary | ICD-10-CM | POA: Diagnosis not present

## 2019-05-24 DIAGNOSIS — I130 Hypertensive heart and chronic kidney disease with heart failure and stage 1 through stage 4 chronic kidney disease, or unspecified chronic kidney disease: Secondary | ICD-10-CM | POA: Diagnosis not present

## 2019-05-24 DIAGNOSIS — E039 Hypothyroidism, unspecified: Secondary | ICD-10-CM | POA: Diagnosis not present

## 2019-05-24 DIAGNOSIS — Z7984 Long term (current) use of oral hypoglycemic drugs: Secondary | ICD-10-CM | POA: Diagnosis not present

## 2019-05-24 DIAGNOSIS — I083 Combined rheumatic disorders of mitral, aortic and tricuspid valves: Secondary | ICD-10-CM | POA: Diagnosis not present

## 2019-05-24 DIAGNOSIS — E1122 Type 2 diabetes mellitus with diabetic chronic kidney disease: Secondary | ICD-10-CM | POA: Diagnosis not present

## 2019-05-24 DIAGNOSIS — M170 Bilateral primary osteoarthritis of knee: Secondary | ICD-10-CM | POA: Diagnosis not present

## 2019-05-24 DIAGNOSIS — J9601 Acute respiratory failure with hypoxia: Secondary | ICD-10-CM | POA: Diagnosis not present

## 2019-05-24 DIAGNOSIS — Z7982 Long term (current) use of aspirin: Secondary | ICD-10-CM | POA: Diagnosis not present

## 2019-05-25 ENCOUNTER — Ambulatory Visit (INDEPENDENT_AMBULATORY_CARE_PROVIDER_SITE_OTHER): Payer: Medicare Other | Admitting: Internal Medicine

## 2019-05-25 ENCOUNTER — Encounter: Payer: Self-pay | Admitting: Internal Medicine

## 2019-05-25 ENCOUNTER — Ambulatory Visit (INDEPENDENT_AMBULATORY_CARE_PROVIDER_SITE_OTHER): Payer: Medicare Other

## 2019-05-25 ENCOUNTER — Other Ambulatory Visit: Payer: Self-pay

## 2019-05-25 VITALS — BP 132/84 | HR 81 | Temp 98.4°F | Ht 65.0 in | Wt 209.0 lb

## 2019-05-25 DIAGNOSIS — I313 Pericardial effusion (noninflammatory): Secondary | ICD-10-CM

## 2019-05-25 DIAGNOSIS — J9601 Acute respiratory failure with hypoxia: Secondary | ICD-10-CM | POA: Diagnosis not present

## 2019-05-25 DIAGNOSIS — E1122 Type 2 diabetes mellitus with diabetic chronic kidney disease: Secondary | ICD-10-CM

## 2019-05-25 DIAGNOSIS — J1282 Pneumonia due to coronavirus disease 2019: Secondary | ICD-10-CM | POA: Diagnosis not present

## 2019-05-25 DIAGNOSIS — E1121 Type 2 diabetes mellitus with diabetic nephropathy: Secondary | ICD-10-CM

## 2019-05-25 DIAGNOSIS — U071 COVID-19: Secondary | ICD-10-CM

## 2019-05-25 DIAGNOSIS — I3139 Other pericardial effusion (noninflammatory): Secondary | ICD-10-CM

## 2019-05-25 DIAGNOSIS — N1831 Chronic kidney disease, stage 3a: Secondary | ICD-10-CM

## 2019-05-25 DIAGNOSIS — R918 Other nonspecific abnormal finding of lung field: Secondary | ICD-10-CM | POA: Diagnosis not present

## 2019-05-25 DIAGNOSIS — R932 Abnormal findings on diagnostic imaging of liver and biliary tract: Secondary | ICD-10-CM | POA: Diagnosis not present

## 2019-05-25 LAB — COMPREHENSIVE METABOLIC PANEL
ALT: 12 U/L (ref 0–35)
AST: 18 U/L (ref 0–37)
Albumin: 4.2 g/dL (ref 3.5–5.2)
Alkaline Phosphatase: 57 U/L (ref 39–117)
BUN: 19 mg/dL (ref 6–23)
CO2: 26 mEq/L (ref 19–32)
Calcium: 10.3 mg/dL (ref 8.4–10.5)
Chloride: 110 mEq/L (ref 96–112)
Creatinine, Ser: 1.08 mg/dL (ref 0.40–1.20)
GFR: 60.09 mL/min (ref >60.00)
Glucose, Bld: 171 mg/dL — ABNORMAL HIGH (ref 70–99)
Potassium: 4.2 mEq/L (ref 3.5–5.1)
Sodium: 143 mEq/L (ref 135–145)
Total Bilirubin: 1 mg/dL (ref 0.2–1.2)
Total Protein: 6.9 g/dL (ref 6.0–8.3)

## 2019-05-25 LAB — CBC
HCT: 39.7 % (ref 36.0–46.0)
Hemoglobin: 13.2 g/dL (ref 12.0–15.0)
MCHC: 33.3 g/dL (ref 30.0–36.0)
MCV: 93.3 fl (ref 78.0–100.0)
Platelets: 188 10*3/uL (ref 150.0–400.0)
RBC: 4.26 Mil/uL (ref 3.87–5.11)
RDW: 16.4 % — ABNORMAL HIGH (ref 11.5–15.5)
WBC: 6.5 10*3/uL (ref 4.0–10.5)

## 2019-05-25 NOTE — Progress Notes (Signed)
   Subjective:   Patient ID: Jade Sullivan, female    DOB: 1945/10/14, 74 y.o.   MRN: [id number]  HPI The patient is a 74 YO female coming in for assessment of her respiratory failure s/p covid-19. She is still on oxygen at home 2L. She is using this with activity only now. Working with PT and they are monitoring O2 levels. Had repeat ECHO recently with decreasing size of pericardial effusion. Needs repeat CXR today. Denies cough. Some fatigue and tiredness mostly. Rare fast HR. Also needs assessment of medications started for diabetes in the hospital if these should be long term.   Review of Systems  Constitutional: Positive for activity change and fatigue.  HENT: Negative.   Eyes: Negative.   Respiratory: Positive for shortness of breath. Negative for cough and chest tightness.   Cardiovascular: Negative for chest pain, palpitations and leg swelling.  Gastrointestinal: Negative for abdominal distention, abdominal pain, constipation, diarrhea, nausea and vomiting.  Musculoskeletal: Negative.   Skin: Negative.   Neurological: Negative.   Psychiatric/Behavioral: Negative.     Objective:  Physical Exam Constitutional:      Appearance: She is well-developed.  HENT:     Head: Normocephalic and atraumatic.  Cardiovascular:     Rate and Rhythm: Normal rate and regular rhythm.  Pulmonary:     Effort: Pulmonary effort is normal. No respiratory distress.     Breath sounds: Normal breath sounds. No wheezing or rales.  Abdominal:     General: Bowel sounds are normal. There is no distension.     Palpations: Abdomen is soft.     Tenderness: There is no abdominal tenderness. There is no rebound.  Musculoskeletal:     Cervical back: Normal range of motion.  Skin:    General: Skin is warm and dry.  Neurological:     Mental Status: She is alert and oriented to person, place, and time.     Coordination: Coordination abnormal.     Comments: Wheelchair due to distance     Vitals:   05/25/19 0818  BP: 132/84  Pulse: 81  Temp: 98.4 F (36.9 C)  TempSrc: Oral  SpO2: 94%  Weight: 209 lb (94.8 kg)  Height: 5\' 5"  (1.651 m)    This visit occurred during the SARS-CoV-2 public health emergency.  Safety protocols were in place, including screening questions prior to the visit, additional usage of staff PPE, and extensive cleaning of exam room while observing appropriate contact time as indicated for disinfecting solutions.   Assessment & Plan:

## 2019-05-25 NOTE — Assessment & Plan Note (Signed)
CXR ordered today for follow up. Will keep O2 with activity. Saturation 94% on RA at rest. Pericardial effusion smaller and no prior imaging to assess if this is new or chronic. Continue work with PT for deconditioning.

## 2019-05-25 NOTE — Assessment & Plan Note (Signed)
She did not have energy for 5 minute walk test today so will keep O2 with ambulation given PT home saturation readings.

## 2019-05-25 NOTE — Patient Instructions (Signed)
We will have you stop the [place] when you run out. Keep the metformin the same.

## 2019-05-25 NOTE — Assessment & Plan Note (Signed)
Improving on recent ECHO. Monitor symptoms. Likely was associated with severe illness.

## 2019-05-25 NOTE — Assessment & Plan Note (Signed)
Will continue metformin 500 mg BID and stop [place]. Recheck HgA1c in 3 months. She is on ARB and statin.

## 2019-05-26 DIAGNOSIS — M170 Bilateral primary osteoarthritis of knee: Secondary | ICD-10-CM | POA: Diagnosis not present

## 2019-05-26 DIAGNOSIS — Z8739 Personal history of other diseases of the musculoskeletal system and connective tissue: Secondary | ICD-10-CM | POA: Diagnosis not present

## 2019-05-26 DIAGNOSIS — Z9981 Dependence on supplemental oxygen: Secondary | ICD-10-CM | POA: Diagnosis not present

## 2019-05-26 DIAGNOSIS — I5032 Chronic diastolic (congestive) heart failure: Secondary | ICD-10-CM | POA: Diagnosis not present

## 2019-05-26 DIAGNOSIS — E1122 Type 2 diabetes mellitus with diabetic chronic kidney disease: Secondary | ICD-10-CM | POA: Diagnosis not present

## 2019-05-26 DIAGNOSIS — J9601 Acute respiratory failure with hypoxia: Secondary | ICD-10-CM | POA: Diagnosis not present

## 2019-05-26 DIAGNOSIS — I083 Combined rheumatic disorders of mitral, aortic and tricuspid valves: Secondary | ICD-10-CM | POA: Diagnosis not present

## 2019-05-26 DIAGNOSIS — I130 Hypertensive heart and chronic kidney disease with heart failure and stage 1 through stage 4 chronic kidney disease, or unspecified chronic kidney disease: Secondary | ICD-10-CM | POA: Diagnosis not present

## 2019-05-26 DIAGNOSIS — N183 Chronic kidney disease, stage 3 unspecified: Secondary | ICD-10-CM | POA: Diagnosis not present

## 2019-05-26 DIAGNOSIS — E785 Hyperlipidemia, unspecified: Secondary | ICD-10-CM | POA: Diagnosis not present

## 2019-05-26 DIAGNOSIS — E039 Hypothyroidism, unspecified: Secondary | ICD-10-CM | POA: Diagnosis not present

## 2019-05-26 DIAGNOSIS — Z7984 Long term (current) use of oral hypoglycemic drugs: Secondary | ICD-10-CM | POA: Diagnosis not present

## 2019-05-26 DIAGNOSIS — Z7982 Long term (current) use of aspirin: Secondary | ICD-10-CM | POA: Diagnosis not present

## 2019-05-27 DIAGNOSIS — M170 Bilateral primary osteoarthritis of knee: Secondary | ICD-10-CM | POA: Diagnosis not present

## 2019-05-27 DIAGNOSIS — I5032 Chronic diastolic (congestive) heart failure: Secondary | ICD-10-CM | POA: Diagnosis not present

## 2019-05-27 DIAGNOSIS — J9601 Acute respiratory failure with hypoxia: Secondary | ICD-10-CM | POA: Diagnosis not present

## 2019-05-27 DIAGNOSIS — I130 Hypertensive heart and chronic kidney disease with heart failure and stage 1 through stage 4 chronic kidney disease, or unspecified chronic kidney disease: Secondary | ICD-10-CM | POA: Diagnosis not present

## 2019-05-27 DIAGNOSIS — Z7984 Long term (current) use of oral hypoglycemic drugs: Secondary | ICD-10-CM | POA: Diagnosis not present

## 2019-05-27 DIAGNOSIS — E039 Hypothyroidism, unspecified: Secondary | ICD-10-CM | POA: Diagnosis not present

## 2019-05-27 DIAGNOSIS — Z9981 Dependence on supplemental oxygen: Secondary | ICD-10-CM | POA: Diagnosis not present

## 2019-05-27 DIAGNOSIS — N183 Chronic kidney disease, stage 3 unspecified: Secondary | ICD-10-CM | POA: Diagnosis not present

## 2019-05-27 DIAGNOSIS — E785 Hyperlipidemia, unspecified: Secondary | ICD-10-CM | POA: Diagnosis not present

## 2019-05-27 DIAGNOSIS — Z7982 Long term (current) use of aspirin: Secondary | ICD-10-CM | POA: Diagnosis not present

## 2019-05-27 DIAGNOSIS — Z8739 Personal history of other diseases of the musculoskeletal system and connective tissue: Secondary | ICD-10-CM | POA: Diagnosis not present

## 2019-05-27 DIAGNOSIS — E1122 Type 2 diabetes mellitus with diabetic chronic kidney disease: Secondary | ICD-10-CM | POA: Diagnosis not present

## 2019-05-27 DIAGNOSIS — I083 Combined rheumatic disorders of mitral, aortic and tricuspid valves: Secondary | ICD-10-CM | POA: Diagnosis not present

## 2019-05-31 DIAGNOSIS — J9601 Acute respiratory failure with hypoxia: Secondary | ICD-10-CM | POA: Diagnosis not present

## 2019-05-31 DIAGNOSIS — Z7982 Long term (current) use of aspirin: Secondary | ICD-10-CM | POA: Diagnosis not present

## 2019-05-31 DIAGNOSIS — Z9981 Dependence on supplemental oxygen: Secondary | ICD-10-CM | POA: Diagnosis not present

## 2019-05-31 DIAGNOSIS — I5032 Chronic diastolic (congestive) heart failure: Secondary | ICD-10-CM | POA: Diagnosis not present

## 2019-05-31 DIAGNOSIS — Z8739 Personal history of other diseases of the musculoskeletal system and connective tissue: Secondary | ICD-10-CM | POA: Diagnosis not present

## 2019-05-31 DIAGNOSIS — N183 Chronic kidney disease, stage 3 unspecified: Secondary | ICD-10-CM | POA: Diagnosis not present

## 2019-05-31 DIAGNOSIS — M170 Bilateral primary osteoarthritis of knee: Secondary | ICD-10-CM | POA: Diagnosis not present

## 2019-05-31 DIAGNOSIS — E785 Hyperlipidemia, unspecified: Secondary | ICD-10-CM | POA: Diagnosis not present

## 2019-05-31 DIAGNOSIS — E039 Hypothyroidism, unspecified: Secondary | ICD-10-CM | POA: Diagnosis not present

## 2019-05-31 DIAGNOSIS — Z7984 Long term (current) use of oral hypoglycemic drugs: Secondary | ICD-10-CM | POA: Diagnosis not present

## 2019-05-31 DIAGNOSIS — I083 Combined rheumatic disorders of mitral, aortic and tricuspid valves: Secondary | ICD-10-CM | POA: Diagnosis not present

## 2019-05-31 DIAGNOSIS — E1122 Type 2 diabetes mellitus with diabetic chronic kidney disease: Secondary | ICD-10-CM | POA: Diagnosis not present

## 2019-05-31 DIAGNOSIS — I130 Hypertensive heart and chronic kidney disease with heart failure and stage 1 through stage 4 chronic kidney disease, or unspecified chronic kidney disease: Secondary | ICD-10-CM | POA: Diagnosis not present

## 2019-06-01 DIAGNOSIS — I5032 Chronic diastolic (congestive) heart failure: Secondary | ICD-10-CM | POA: Diagnosis not present

## 2019-06-01 DIAGNOSIS — Z7984 Long term (current) use of oral hypoglycemic drugs: Secondary | ICD-10-CM | POA: Diagnosis not present

## 2019-06-01 DIAGNOSIS — E039 Hypothyroidism, unspecified: Secondary | ICD-10-CM | POA: Diagnosis not present

## 2019-06-01 DIAGNOSIS — Z9981 Dependence on supplemental oxygen: Secondary | ICD-10-CM | POA: Diagnosis not present

## 2019-06-01 DIAGNOSIS — M170 Bilateral primary osteoarthritis of knee: Secondary | ICD-10-CM | POA: Diagnosis not present

## 2019-06-01 DIAGNOSIS — Z8739 Personal history of other diseases of the musculoskeletal system and connective tissue: Secondary | ICD-10-CM | POA: Diagnosis not present

## 2019-06-01 DIAGNOSIS — E785 Hyperlipidemia, unspecified: Secondary | ICD-10-CM | POA: Diagnosis not present

## 2019-06-01 DIAGNOSIS — N183 Chronic kidney disease, stage 3 unspecified: Secondary | ICD-10-CM | POA: Diagnosis not present

## 2019-06-01 DIAGNOSIS — I083 Combined rheumatic disorders of mitral, aortic and tricuspid valves: Secondary | ICD-10-CM | POA: Diagnosis not present

## 2019-06-01 DIAGNOSIS — Z7982 Long term (current) use of aspirin: Secondary | ICD-10-CM | POA: Diagnosis not present

## 2019-06-01 DIAGNOSIS — I130 Hypertensive heart and chronic kidney disease with heart failure and stage 1 through stage 4 chronic kidney disease, or unspecified chronic kidney disease: Secondary | ICD-10-CM | POA: Diagnosis not present

## 2019-06-01 DIAGNOSIS — E1122 Type 2 diabetes mellitus with diabetic chronic kidney disease: Secondary | ICD-10-CM | POA: Diagnosis not present

## 2019-06-01 DIAGNOSIS — J9601 Acute respiratory failure with hypoxia: Secondary | ICD-10-CM | POA: Diagnosis not present

## 2019-06-02 ENCOUNTER — Ambulatory Visit
Admission: RE | Admit: 2019-06-02 | Discharge: 2019-06-02 | Disposition: A | Discharge status: Home / Self Care | Payer: Medicare Other | Source: Ambulatory Visit | Attending: Internal Medicine | Admitting: Internal Medicine

## 2019-06-02 DIAGNOSIS — R932 Abnormal findings on diagnostic imaging of liver and biliary tract: Secondary | ICD-10-CM

## 2019-06-02 DIAGNOSIS — K7689 Other specified diseases of liver: Secondary | ICD-10-CM | POA: Diagnosis not present

## 2019-06-03 ENCOUNTER — Other Ambulatory Visit: Payer: Self-pay | Admitting: Internal Medicine

## 2019-06-03 DIAGNOSIS — K769 Liver disease, unspecified: Secondary | ICD-10-CM

## 2019-06-03 DIAGNOSIS — N183 Chronic kidney disease, stage 3 unspecified: Secondary | ICD-10-CM | POA: Diagnosis not present

## 2019-06-03 DIAGNOSIS — I130 Hypertensive heart and chronic kidney disease with heart failure and stage 1 through stage 4 chronic kidney disease, or unspecified chronic kidney disease: Secondary | ICD-10-CM | POA: Diagnosis not present

## 2019-06-03 DIAGNOSIS — E039 Hypothyroidism, unspecified: Secondary | ICD-10-CM | POA: Diagnosis not present

## 2019-06-03 DIAGNOSIS — Z8739 Personal history of other diseases of the musculoskeletal system and connective tissue: Secondary | ICD-10-CM | POA: Diagnosis not present

## 2019-06-03 DIAGNOSIS — E1122 Type 2 diabetes mellitus with diabetic chronic kidney disease: Secondary | ICD-10-CM | POA: Diagnosis not present

## 2019-06-03 DIAGNOSIS — Z7982 Long term (current) use of aspirin: Secondary | ICD-10-CM | POA: Diagnosis not present

## 2019-06-03 DIAGNOSIS — I083 Combined rheumatic disorders of mitral, aortic and tricuspid valves: Secondary | ICD-10-CM | POA: Diagnosis not present

## 2019-06-03 DIAGNOSIS — E785 Hyperlipidemia, unspecified: Secondary | ICD-10-CM | POA: Diagnosis not present

## 2019-06-03 DIAGNOSIS — M170 Bilateral primary osteoarthritis of knee: Secondary | ICD-10-CM | POA: Diagnosis not present

## 2019-06-03 DIAGNOSIS — Z9981 Dependence on supplemental oxygen: Secondary | ICD-10-CM | POA: Diagnosis not present

## 2019-06-03 DIAGNOSIS — I5032 Chronic diastolic (congestive) heart failure: Secondary | ICD-10-CM | POA: Diagnosis not present

## 2019-06-03 DIAGNOSIS — J9601 Acute respiratory failure with hypoxia: Secondary | ICD-10-CM | POA: Diagnosis not present

## 2019-06-03 DIAGNOSIS — Z7984 Long term (current) use of oral hypoglycemic drugs: Secondary | ICD-10-CM | POA: Diagnosis not present

## 2019-06-08 DIAGNOSIS — Z8739 Personal history of other diseases of the musculoskeletal system and connective tissue: Secondary | ICD-10-CM | POA: Diagnosis not present

## 2019-06-08 DIAGNOSIS — I130 Hypertensive heart and chronic kidney disease with heart failure and stage 1 through stage 4 chronic kidney disease, or unspecified chronic kidney disease: Secondary | ICD-10-CM | POA: Diagnosis not present

## 2019-06-08 DIAGNOSIS — N183 Chronic kidney disease, stage 3 unspecified: Secondary | ICD-10-CM | POA: Diagnosis not present

## 2019-06-08 DIAGNOSIS — E039 Hypothyroidism, unspecified: Secondary | ICD-10-CM | POA: Diagnosis not present

## 2019-06-08 DIAGNOSIS — Z7984 Long term (current) use of oral hypoglycemic drugs: Secondary | ICD-10-CM | POA: Diagnosis not present

## 2019-06-08 DIAGNOSIS — E785 Hyperlipidemia, unspecified: Secondary | ICD-10-CM | POA: Diagnosis not present

## 2019-06-08 DIAGNOSIS — Z9981 Dependence on supplemental oxygen: Secondary | ICD-10-CM | POA: Diagnosis not present

## 2019-06-08 DIAGNOSIS — I5032 Chronic diastolic (congestive) heart failure: Secondary | ICD-10-CM | POA: Diagnosis not present

## 2019-06-08 DIAGNOSIS — J9601 Acute respiratory failure with hypoxia: Secondary | ICD-10-CM | POA: Diagnosis not present

## 2019-06-08 DIAGNOSIS — M170 Bilateral primary osteoarthritis of knee: Secondary | ICD-10-CM | POA: Diagnosis not present

## 2019-06-08 DIAGNOSIS — E1122 Type 2 diabetes mellitus with diabetic chronic kidney disease: Secondary | ICD-10-CM | POA: Diagnosis not present

## 2019-06-08 DIAGNOSIS — I083 Combined rheumatic disorders of mitral, aortic and tricuspid valves: Secondary | ICD-10-CM | POA: Diagnosis not present

## 2019-06-08 DIAGNOSIS — Z7982 Long term (current) use of aspirin: Secondary | ICD-10-CM | POA: Diagnosis not present

## 2019-06-10 DIAGNOSIS — Z7984 Long term (current) use of oral hypoglycemic drugs: Secondary | ICD-10-CM | POA: Diagnosis not present

## 2019-06-10 DIAGNOSIS — Z8739 Personal history of other diseases of the musculoskeletal system and connective tissue: Secondary | ICD-10-CM | POA: Diagnosis not present

## 2019-06-10 DIAGNOSIS — I5032 Chronic diastolic (congestive) heart failure: Secondary | ICD-10-CM | POA: Diagnosis not present

## 2019-06-10 DIAGNOSIS — E039 Hypothyroidism, unspecified: Secondary | ICD-10-CM | POA: Diagnosis not present

## 2019-06-10 DIAGNOSIS — N183 Chronic kidney disease, stage 3 unspecified: Secondary | ICD-10-CM | POA: Diagnosis not present

## 2019-06-10 DIAGNOSIS — E785 Hyperlipidemia, unspecified: Secondary | ICD-10-CM | POA: Diagnosis not present

## 2019-06-10 DIAGNOSIS — Z7982 Long term (current) use of aspirin: Secondary | ICD-10-CM | POA: Diagnosis not present

## 2019-06-10 DIAGNOSIS — I083 Combined rheumatic disorders of mitral, aortic and tricuspid valves: Secondary | ICD-10-CM | POA: Diagnosis not present

## 2019-06-10 DIAGNOSIS — J9601 Acute respiratory failure with hypoxia: Secondary | ICD-10-CM | POA: Diagnosis not present

## 2019-06-10 DIAGNOSIS — Z9981 Dependence on supplemental oxygen: Secondary | ICD-10-CM | POA: Diagnosis not present

## 2019-06-10 DIAGNOSIS — M170 Bilateral primary osteoarthritis of knee: Secondary | ICD-10-CM | POA: Diagnosis not present

## 2019-06-10 DIAGNOSIS — E1122 Type 2 diabetes mellitus with diabetic chronic kidney disease: Secondary | ICD-10-CM | POA: Diagnosis not present

## 2019-06-10 DIAGNOSIS — I130 Hypertensive heart and chronic kidney disease with heart failure and stage 1 through stage 4 chronic kidney disease, or unspecified chronic kidney disease: Secondary | ICD-10-CM | POA: Diagnosis not present

## 2019-06-11 ENCOUNTER — Other Ambulatory Visit: Payer: Self-pay

## 2019-06-11 ENCOUNTER — Encounter: Payer: Self-pay | Admitting: Internal Medicine

## 2019-06-11 ENCOUNTER — Ambulatory Visit (INDEPENDENT_AMBULATORY_CARE_PROVIDER_SITE_OTHER): Payer: Medicare Other | Admitting: Internal Medicine

## 2019-06-11 VITALS — BP 142/88 | HR 63 | Temp 98.8°F | Ht 65.0 in | Wt 207.2 lb

## 2019-06-11 DIAGNOSIS — E1169 Type 2 diabetes mellitus with other specified complication: Secondary | ICD-10-CM

## 2019-06-11 DIAGNOSIS — E785 Hyperlipidemia, unspecified: Secondary | ICD-10-CM

## 2019-06-11 DIAGNOSIS — E1121 Type 2 diabetes mellitus with diabetic nephropathy: Secondary | ICD-10-CM | POA: Diagnosis not present

## 2019-06-11 DIAGNOSIS — K769 Liver disease, unspecified: Secondary | ICD-10-CM | POA: Diagnosis not present

## 2019-06-11 DIAGNOSIS — Z0000 Encounter for general adult medical examination without abnormal findings: Secondary | ICD-10-CM

## 2019-06-11 DIAGNOSIS — E1122 Type 2 diabetes mellitus with diabetic chronic kidney disease: Secondary | ICD-10-CM

## 2019-06-11 DIAGNOSIS — I129 Hypertensive chronic kidney disease with stage 1 through stage 4 chronic kidney disease, or unspecified chronic kidney disease: Secondary | ICD-10-CM

## 2019-06-11 DIAGNOSIS — N1831 Chronic kidney disease, stage 3a: Secondary | ICD-10-CM

## 2019-06-11 DIAGNOSIS — E034 Atrophy of thyroid (acquired): Secondary | ICD-10-CM | POA: Diagnosis not present

## 2019-06-11 LAB — CBC
HCT: 38 % (ref 36.0–46.0)
Hemoglobin: 12.8 g/dL (ref 12.0–15.0)
MCHC: 33.8 g/dL (ref 30.0–36.0)
MCV: 93.1 fl (ref 78.0–100.0)
Platelets: 221 10*3/uL (ref 150.0–400.0)
RBC: 4.08 Mil/uL (ref 3.87–5.11)
RDW: 16.1 % — ABNORMAL HIGH (ref 11.5–15.5)
WBC: 5.1 10*3/uL (ref 4.0–10.5)

## 2019-06-11 LAB — LIPID PANEL
Cholesterol: 138 mg/dL (ref 0–200)
HDL: 31.1 mg/dL — ABNORMAL LOW (ref >39.00)
LDL Cholesterol: 87 mg/dL (ref 0–99)
NonHDL: 106.87
Total CHOL/HDL Ratio: 4
Triglycerides: 99 mg/dL (ref 0.0–149.0)
VLDL: 19.8 mg/dL (ref 0.0–40.0)

## 2019-06-11 LAB — COMPREHENSIVE METABOLIC PANEL
ALT: 9 U/L (ref 0–35)
AST: 16 U/L (ref 0–37)
Albumin: 3.8 g/dL (ref 3.5–5.2)
Alkaline Phosphatase: 65 U/L (ref 39–117)
BUN: 13 mg/dL (ref 6–23)
CO2: 26 mEq/L (ref 19–32)
Calcium: 10.2 mg/dL (ref 8.4–10.5)
Chloride: 108 mEq/L (ref 96–112)
Creatinine, Ser: 1.01 mg/dL (ref 0.40–1.20)
GFR: 64.91 mL/min (ref >60.00)
Glucose, Bld: 141 mg/dL — ABNORMAL HIGH (ref 70–99)
Potassium: 4.2 mEq/L (ref 3.5–5.1)
Sodium: 140 mEq/L (ref 135–145)
Total Bilirubin: 0.8 mg/dL (ref 0.2–1.2)
Total Protein: 6.8 g/dL (ref 6.0–8.3)

## 2019-06-11 LAB — T4, FREE: Free T4: 0.78 ng/dL (ref 0.60–1.60)

## 2019-06-11 LAB — TSH: TSH: 4.23 u[IU]/mL (ref 0.35–4.50)

## 2019-06-11 NOTE — Patient Instructions (Addendum)
We will do the blood work today.   COVID-19 Vaccine Information can be found at: [email or web address] For questions related to vaccine distribution or appointments, please email [email or web address] or call [phone].   For tylenol (acetaminophen) make sure not to go over 2000 mg in a day for the liver.   Health Maintenance, Female Adopting a healthy lifestyle and getting preventive care are important in promoting health and wellness. Ask your health care provider about:  The right schedule for you to have regular tests and exams.  Things you can do on your own to prevent diseases and keep yourself healthy. What should I know about diet, weight, and exercise? Eat a healthy diet   Eat a diet that includes plenty of vegetables, fruits, low-fat dairy products, and lean protein.  Do not eat a lot of foods that are high in solid fats, added sugars, or sodium. Maintain a healthy weight Body mass index (BMI) is used to identify weight problems. It estimates body fat based on height and weight. Your health care provider can help determine your BMI and help you achieve or maintain a healthy weight. Get regular exercise Get regular exercise. This is one of the most important things you can do for your health. Most adults should:  Exercise for at least 150 minutes each week. The exercise should increase your heart rate and make you sweat (moderate-intensity exercise).  Do strengthening exercises at least twice a week. This is in addition to the moderate-intensity exercise.  Spend less time sitting. Even light physical activity can be beneficial. Watch cholesterol and blood lipids Have your blood tested for lipids and cholesterol at 74 years of age, then have this test every 5 years. Have your cholesterol levels checked more often if:  Your lipid or cholesterol levels are high.  You are older than 74 years of age.  You are at high  risk for heart disease. What should I know about cancer screening? Depending on your health history and family history, you may need to have cancer screening at various ages. This may include screening for:  Breast cancer.  Cervical cancer.  Colorectal cancer.  Skin cancer.  Lung cancer. What should I know about heart disease, diabetes, and high blood pressure? Blood pressure and heart disease  High blood pressure causes heart disease and increases the risk of stroke. This is more likely to develop in people who have high blood pressure readings, are of African descent, or are overweight.  Have your blood pressure checked: ? Every 3-5 years if you are 72-52 years of age. ? Every year if you are 47 years old or older. Diabetes Have regular diabetes screenings. This checks your fasting blood sugar level. Have the screening done:  Once every three years after age 52 if you are at a normal weight and have a low risk for diabetes.  More often and at a younger age if you are overweight or have a high risk for diabetes. What should I know about preventing infection? Hepatitis B If you have a higher risk for hepatitis B, you should be screened for this virus. Talk with your health care provider to find out if you are at risk for hepatitis B infection. Hepatitis C Testing is recommended for:  Everyone born from [birth year] through 1965.  Anyone with known risk factors for hepatitis C. Sexually transmitted infections (STIs)  Get screened for STIs, including gonorrhea and chlamydia, if: ? You are sexually active and are younger than 74 years of age. ?  You are older than 74 years of age and your health care provider tells you that you are at risk for this type of infection. ? Your sexual activity has changed since you were last screened, and you are at increased risk for chlamydia or gonorrhea. Ask your health care provider if you are at risk.  Ask your health care provider about whether you  are at high risk for HIV. Your health care provider may recommend a prescription medicine to help prevent HIV infection. If you choose to take medicine to prevent HIV, you should first get tested for HIV. You should then be tested every 3 months for as long as you are taking the medicine. Pregnancy  If you are about to stop having your period (premenopausal) and you may become pregnant, seek counseling before you get pregnant.  Take 400 to 800 micrograms (mcg) of folic acid every day if you become pregnant.  Ask for birth control (contraception) if you want to prevent pregnancy. Osteoporosis and menopause Osteoporosis is a disease in which the bones lose minerals and strength with aging. This can result in bone fractures. If you are 88 years old or older, or if you are at risk for osteoporosis and fractures, ask your health care provider if you should:  Be screened for bone loss.  Take a calcium or vitamin D supplement to lower your risk of fractures.  Be given hormone replacement therapy (HRT) to treat symptoms of menopause. Follow these instructions at home: Lifestyle  Do not use any products that contain nicotine or tobacco, such as cigarettes, e-cigarettes, and chewing tobacco. If you need help quitting, ask your health care provider.  Do not use street drugs.  Do not share needles.  Ask your health care provider for help if you need support or information about quitting drugs. Alcohol use  Do not drink alcohol if: ? Your health care provider tells you not to drink. ? You are pregnant, may be pregnant, or are planning to become pregnant.  If you drink alcohol: ? Limit how much you use to 0-1 drink a day. ? Limit intake if you are breastfeeding.  Be aware of how much alcohol is in your drink. In the [place] one drink equals one 12 oz bottle of beer (355 mL), one 5 oz glass of wine (148 mL), or one 1 oz glass of hard liquor (44 mL). General instructions  Schedule regular  health, dental, and eye exams.  Stay current with your vaccines.  Tell your health care provider if: ? You often feel depressed. ? You have ever been abused or do not feel safe at home. Summary  Adopting a healthy lifestyle and getting preventive care are important in promoting health and wellness.  Follow your health care provider's instructions about healthy diet, exercising, and getting tested or screened for diseases.  Follow your health care provider's instructions on monitoring your cholesterol and blood pressure. This information is not intended to replace advice given to you by your health care provider. Make sure you discuss any questions you have with your health care provider. Document Revised: 03/11/2018 Document Reviewed: 03/11/2018 Elsevier Patient Education  2020 [employer].

## 2019-06-11 NOTE — Assessment & Plan Note (Signed)
BP at goal on clonidine, losartan, bystolic, spironolactone

## 2019-06-11 NOTE — Assessment & Plan Note (Signed)
Awaiting GI for further evaluation of potential cirrhosis on imaging.

## 2019-06-11 NOTE — Assessment & Plan Note (Signed)
Flu shot up to date. Advised she can now get covid-19 vaccine. Pneumonia complete. Shingrix counseled. Tetanus due 2025. Colonoscopy due late 2021. Mammogram due August 2021, pap smear aged out and dexa due 2025. Counseled about sun safety and mole surveillance. Counseled about the dangers of distracted driving. Given 10 year screening recommendations.

## 2019-06-11 NOTE — Assessment & Plan Note (Signed)
Checking lipid panel and adjust lipitor 20 mg daily.  

## 2019-06-11 NOTE — Assessment & Plan Note (Signed)
Checking CMP for stability. Home u/a done without protein yesterday. Has HTN and DM.

## 2019-06-11 NOTE — Assessment & Plan Note (Signed)
Checking TSH and free T4 and adjust as needed her synthroid 50 mcg daily.

## 2019-06-11 NOTE — Progress Notes (Signed)
Subjective:   Patient ID: Jade Sullivan, female    DOB: Nov 15, 1945, 74 y.o.   MRN: [id number]  HPI Here for medicare wellness and physical, no new complaints. Please see A/P for status and treatment of chronic medical problems.   Diet: DM since diabetic Physical activity: sedentary Depression/mood screen: negative Hearing: intact to whispered voice Visual acuity: grossly normal, overdue for annual eye exam  ADLs: capable Fall risk: none Home safety: good Cognitive evaluation: intact to orientation, naming, recall and repetition EOL planning: adv directives discussed    Office Visit from 06/11/2019 in [place] at [facility] Total Score  0       I have personally reviewed and have noted 1. The patient's medical and social history - reviewed today no changes 2. Their use of alcohol, tobacco or illicit drugs 3. Their current medications and supplements 4. The patient's functional ability including ADL's, fall risks, home safety risks and hearing or visual impairment. 5. Diet and physical activities 6. Evidence for depression or mood disorders 7. Care team reviewed and updated  Patient Care Team: Hoyt Koch, MD as PCP - General (Internal Medicine) Princess Bruins, MD as Consulting Physician (Obstetrics and Gynecology) Hilarie Fredrickson, Lajuan Lines, MD as Consulting Physician (Gastroenterology) Lyndal Pulley, DO as Consulting Physician (Family Medicine) Past Medical History:  Diagnosis Date  . Allergy    seasonal  . Arthritis    knees  . Chronic kidney disease   . Diverticulosis of colon (without mention of hemorrhage)   . Dysmetabolic syndrome X   . Gout   . H/O hyperthyroidism    By patient h/o hyperthyroidism with protosis, s/p subtotal thyroidectomy. On no replacement therapy. Last thyroid functions 2013 in [place]  Plan Will need thyroid functions Feb '15 with recommendations to follow.    . Heart murmur    yeras ago told had murmur  .  Hyperlipidemia   . Hypertension    on multiple meds  . Hypothyroidism    Past Surgical History:  Procedure Laterality Date  . [place]  . COLONOSCOPY    . EYE SURGERY    . HERNIA REPAIR  2000  . LIPOMA EXCISION Right 11/24/2014   Procedure: EXCISION RIGHT SHOULDER LIPOMA;  Surgeon: Erroll Luna, MD;  Location: [place];  Service: General;  Laterality: Right;  . POLYPECTOMY    . THYROIDECTOMY, PARTIAL  1981   Family History  Problem Relation Age of Onset  . Diabetes Mother        DM2  . Hypertension Mother   . Pneumonia Mother        died from this condition  . Colon polyps Mother   . Heart disease Father        died from this condition  . Colon polyps Father   . Kidney disease Sister        HBP and DM2; relative died from this condition  . Heart disease Brother        MI - HBP and DM2  . Colon cancer Neg Hx   . Esophageal cancer Neg Hx   . Rectal cancer Neg Hx   . Stomach cancer Neg Hx    Review of Systems  Constitutional: Negative.   HENT: Negative.   Eyes: Negative.   Respiratory: Negative for cough, chest tightness and shortness of breath.   Cardiovascular: Negative for chest pain, palpitations and leg swelling.  Gastrointestinal: Negative for abdominal distention, abdominal pain, constipation, diarrhea, nausea  and vomiting.  Musculoskeletal: Negative.   Skin: Negative.   Neurological: Negative.   Psychiatric/Behavioral: Negative.     Objective:  Physical Exam Constitutional:      Appearance: She is well-developed.  HENT:     Head: Normocephalic and atraumatic.  Cardiovascular:     Rate and Rhythm: Normal rate and regular rhythm.  Pulmonary:     Effort: Pulmonary effort is normal. No respiratory distress.     Breath sounds: Normal breath sounds. No wheezing or rales.  Abdominal:     General: Bowel sounds are normal. There is no distension.     Palpations: Abdomen is soft.     Tenderness: There is no abdominal  tenderness. There is no rebound.  Musculoskeletal:     Cervical back: Normal range of motion.  Skin:    General: Skin is warm and dry.  Neurological:     Mental Status: She is alert and oriented to person, place, and time.     Coordination: Coordination abnormal.     Comments: Wheelchair long distances     Vitals:   06/11/19 0946  BP: (!) 142/88  Pulse: 63  Temp: 98.8 F (37.1 C)  TempSrc: Oral  SpO2: 97%  Weight: 207 lb 4 oz (94 kg)  Height: 5\' 5"  (1.651 m)    This visit occurred during the SARS-CoV-2 public health emergency.  Safety protocols were in place, including screening questions prior to the visit, additional usage of staff PPE, and extensive cleaning of exam room while observing appropriate contact time as indicated for disinfecting solutions.   Assessment & Plan:

## 2019-06-11 NOTE — Assessment & Plan Note (Addendum)
Foot exam done, checking fructosamine today. Adjust metformin as needed. Taking statin and ARB.

## 2019-06-14 DIAGNOSIS — Z7984 Long term (current) use of oral hypoglycemic drugs: Secondary | ICD-10-CM | POA: Diagnosis not present

## 2019-06-14 DIAGNOSIS — I130 Hypertensive heart and chronic kidney disease with heart failure and stage 1 through stage 4 chronic kidney disease, or unspecified chronic kidney disease: Secondary | ICD-10-CM | POA: Diagnosis not present

## 2019-06-14 DIAGNOSIS — E785 Hyperlipidemia, unspecified: Secondary | ICD-10-CM | POA: Diagnosis not present

## 2019-06-14 DIAGNOSIS — Z9981 Dependence on supplemental oxygen: Secondary | ICD-10-CM | POA: Diagnosis not present

## 2019-06-14 DIAGNOSIS — J9601 Acute respiratory failure with hypoxia: Secondary | ICD-10-CM | POA: Diagnosis not present

## 2019-06-14 DIAGNOSIS — Z8739 Personal history of other diseases of the musculoskeletal system and connective tissue: Secondary | ICD-10-CM | POA: Diagnosis not present

## 2019-06-14 DIAGNOSIS — E1122 Type 2 diabetes mellitus with diabetic chronic kidney disease: Secondary | ICD-10-CM | POA: Diagnosis not present

## 2019-06-14 DIAGNOSIS — Z7982 Long term (current) use of aspirin: Secondary | ICD-10-CM | POA: Diagnosis not present

## 2019-06-14 DIAGNOSIS — I5032 Chronic diastolic (congestive) heart failure: Secondary | ICD-10-CM | POA: Diagnosis not present

## 2019-06-14 DIAGNOSIS — M170 Bilateral primary osteoarthritis of knee: Secondary | ICD-10-CM | POA: Diagnosis not present

## 2019-06-14 DIAGNOSIS — N183 Chronic kidney disease, stage 3 unspecified: Secondary | ICD-10-CM | POA: Diagnosis not present

## 2019-06-14 DIAGNOSIS — I083 Combined rheumatic disorders of mitral, aortic and tricuspid valves: Secondary | ICD-10-CM | POA: Diagnosis not present

## 2019-06-14 DIAGNOSIS — E039 Hypothyroidism, unspecified: Secondary | ICD-10-CM | POA: Diagnosis not present

## 2019-06-15 DIAGNOSIS — Z7982 Long term (current) use of aspirin: Secondary | ICD-10-CM | POA: Diagnosis not present

## 2019-06-15 DIAGNOSIS — E039 Hypothyroidism, unspecified: Secondary | ICD-10-CM | POA: Diagnosis not present

## 2019-06-15 DIAGNOSIS — Z8739 Personal history of other diseases of the musculoskeletal system and connective tissue: Secondary | ICD-10-CM | POA: Diagnosis not present

## 2019-06-15 DIAGNOSIS — I130 Hypertensive heart and chronic kidney disease with heart failure and stage 1 through stage 4 chronic kidney disease, or unspecified chronic kidney disease: Secondary | ICD-10-CM | POA: Diagnosis not present

## 2019-06-15 DIAGNOSIS — J9601 Acute respiratory failure with hypoxia: Secondary | ICD-10-CM | POA: Diagnosis not present

## 2019-06-15 DIAGNOSIS — I5032 Chronic diastolic (congestive) heart failure: Secondary | ICD-10-CM | POA: Diagnosis not present

## 2019-06-15 DIAGNOSIS — Z7984 Long term (current) use of oral hypoglycemic drugs: Secondary | ICD-10-CM | POA: Diagnosis not present

## 2019-06-15 DIAGNOSIS — N183 Chronic kidney disease, stage 3 unspecified: Secondary | ICD-10-CM | POA: Diagnosis not present

## 2019-06-15 DIAGNOSIS — E1122 Type 2 diabetes mellitus with diabetic chronic kidney disease: Secondary | ICD-10-CM | POA: Diagnosis not present

## 2019-06-15 DIAGNOSIS — M170 Bilateral primary osteoarthritis of knee: Secondary | ICD-10-CM | POA: Diagnosis not present

## 2019-06-15 DIAGNOSIS — Z9981 Dependence on supplemental oxygen: Secondary | ICD-10-CM | POA: Diagnosis not present

## 2019-06-15 DIAGNOSIS — E785 Hyperlipidemia, unspecified: Secondary | ICD-10-CM | POA: Diagnosis not present

## 2019-06-15 DIAGNOSIS — I083 Combined rheumatic disorders of mitral, aortic and tricuspid valves: Secondary | ICD-10-CM | POA: Diagnosis not present

## 2019-06-15 LAB — FRUCTOSAMINE: Fructosamine: 283 umol/L (ref 205–285)

## 2019-06-16 DIAGNOSIS — E039 Hypothyroidism, unspecified: Secondary | ICD-10-CM | POA: Diagnosis not present

## 2019-06-16 DIAGNOSIS — I083 Combined rheumatic disorders of mitral, aortic and tricuspid valves: Secondary | ICD-10-CM | POA: Diagnosis not present

## 2019-06-16 DIAGNOSIS — E785 Hyperlipidemia, unspecified: Secondary | ICD-10-CM | POA: Diagnosis not present

## 2019-06-16 DIAGNOSIS — M170 Bilateral primary osteoarthritis of knee: Secondary | ICD-10-CM | POA: Diagnosis not present

## 2019-06-16 DIAGNOSIS — E1122 Type 2 diabetes mellitus with diabetic chronic kidney disease: Secondary | ICD-10-CM | POA: Diagnosis not present

## 2019-06-16 DIAGNOSIS — Z9981 Dependence on supplemental oxygen: Secondary | ICD-10-CM | POA: Diagnosis not present

## 2019-06-16 DIAGNOSIS — I5032 Chronic diastolic (congestive) heart failure: Secondary | ICD-10-CM | POA: Diagnosis not present

## 2019-06-16 DIAGNOSIS — I130 Hypertensive heart and chronic kidney disease with heart failure and stage 1 through stage 4 chronic kidney disease, or unspecified chronic kidney disease: Secondary | ICD-10-CM | POA: Diagnosis not present

## 2019-06-16 DIAGNOSIS — Z7984 Long term (current) use of oral hypoglycemic drugs: Secondary | ICD-10-CM | POA: Diagnosis not present

## 2019-06-16 DIAGNOSIS — Z8739 Personal history of other diseases of the musculoskeletal system and connective tissue: Secondary | ICD-10-CM | POA: Diagnosis not present

## 2019-06-16 DIAGNOSIS — Z7982 Long term (current) use of aspirin: Secondary | ICD-10-CM | POA: Diagnosis not present

## 2019-06-16 DIAGNOSIS — J9601 Acute respiratory failure with hypoxia: Secondary | ICD-10-CM | POA: Diagnosis not present

## 2019-06-16 DIAGNOSIS — N183 Chronic kidney disease, stage 3 unspecified: Secondary | ICD-10-CM | POA: Diagnosis not present

## 2019-06-25 ENCOUNTER — Ambulatory Visit: Payer: Medicare Other

## 2019-06-25 ENCOUNTER — Other Ambulatory Visit: Payer: Self-pay

## 2019-06-25 VITALS — BP 140/70 | HR 60 | Temp 99.2°F | Resp 16 | Ht 65.0 in | Wt 208.0 lb

## 2019-06-25 DIAGNOSIS — Z0000 Encounter for general adult medical examination without abnormal findings: Secondary | ICD-10-CM

## 2019-06-25 NOTE — Patient Instructions (Addendum)
Jade Sullivan , Thank you for taking time to come for your Medicare Wellness Visit. I appreciate your ongoing commitment to your health goals. Please review the following plan we discussed and let me know if I can assist you in the future.   Screening recommendations/referrals: Colorectal Screening: 03/04/2025 Mammogram: 06/03/2020 Bone Density: last one was done 03/07/2016  Vision and Dental Exams: Recommended annual ophthalmology exams for early detection of glaucoma and other disorders of the eye Recommended annual dental exams for proper oral hygiene  Diabetic Exams: Diabetic Eye Exam: every 6 months per patient   Diabetic Foot Exam: 06/11/2019  Vaccinations: Influenza vaccine: 01/15/2019 Pneumococcal vaccine: 08/29/2015, 01/22/2012 Tdap vaccine: 09/06/2013 Shingles vaccine:denied Covid vaccine: denied  Advanced directives:  Advance directives discussed with you today.  Please bring a copy of your POA (Power of [place]) and/or Living Will to your next appointment.  Goals:  Recommend to drink at least 6-8 8oz glasses of water per day.  Recommend to exercise for at least 150 minutes per week.  Recommend to remove any items from the home that may cause slips or trips.  Recommend to decrease portion sizes by eating 3 small healthy meals and at least 2 healthy snacks per day.  Recommend to begin DASH diet as directed below  Next appointment: Please schedule your Annual Wellness Visit with your Nurse Health Advisor in one year.  Preventive Care 74 Years and Older, Female Preventive care refers to lifestyle choices and visits with your health care provider that can promote health and wellness. What does preventive care include?  A yearly physical exam. This is also called an annual well check.  Dental exams once or twice a year.  Routine eye exams. Ask your health care provider how often you should have your eyes checked.  Personal lifestyle choices, including:  Daily care of  your teeth and gums.  Regular physical activity.  Eating a healthy diet.  Avoiding tobacco and drug use.  Limiting alcohol use.  Practicing safe sex.  Taking low-dose aspirin every day if recommended by your health care provider.  Taking vitamin and mineral supplements as recommended by your health care provider. What happens during an annual well check? The services and screenings done by your health care provider during your annual well check will depend on your age, overall health, lifestyle risk factors, and family history of disease. Counseling  Your health care provider may ask you questions about your:  Alcohol use.  Tobacco use.  Drug use.  Emotional well-being.  Home and relationship well-being.  Sexual activity.  Eating habits.  History of falls.  Memory and ability to understand (cognition).  Work and work Statistician.  Reproductive health. Screening  You may have the following tests or measurements:  Height, weight, and BMI.  Blood pressure.  Lipid and cholesterol levels. These may be checked every 5 years, or more frequently if you are over 73 years old.  Skin check.  Lung cancer screening. You may have this screening every year starting at age 54 if you have a 30-pack-year history of smoking and currently smoke or have quit within the past 15 years.  Fecal occult blood test (FOBT) of the stool. You may have this test every year starting at age 56.  Flexible sigmoidoscopy or colonoscopy. You may have a sigmoidoscopy every 5 years or a colonoscopy every 10 years starting at age 2.  Hepatitis C blood test.  Hepatitis B blood test.  Sexually transmitted disease (STD) testing.  Diabetes screening. This  is done by checking your blood sugar (glucose) after you have not eaten for a while (fasting). You may have this done every 1-3 years.  Bone density scan. This is done to screen for osteoporosis. You may have this done starting at age  20.  Mammogram. This may be done every 1-2 years. Talk to your health care provider about how often you should have regular mammograms. Talk with your health care provider about your test results, treatment options, and if necessary, the need for more tests. Vaccines  Your health care provider may recommend certain vaccines, such as:  Influenza vaccine. This is recommended every year.  Tetanus, diphtheria, and acellular pertussis (Tdap, Td) vaccine. You may need a Td booster every 10 years.  Zoster vaccine. You may need this after age 70.  Pneumococcal 13-valent conjugate (PCV13) vaccine. One dose is recommended after age 48.  Pneumococcal polysaccharide (PPSV23) vaccine. One dose is recommended after age 66. Talk to your health care provider about which screenings and vaccines you need and how often you need them. This information is not intended to replace advice given to you by your health care provider. Make sure you discuss any questions you have with your health care provider. Document Released: 04/14/2015 Document Revised: 12/06/2015 Document Reviewed: 01/17/2015 Elsevier Interactive Patient Education  2017 [employer] Prevention in the Home Falls can cause injuries. They can happen to people of all ages. There are many things you can do to make your home safe and to help prevent falls. What can I do on the outside of my home?  Regularly fix the edges of walkways and driveways and fix any cracks.  Remove anything that might make you trip as you walk through a door, such as a raised step or threshold.  Trim any bushes or trees on the path to your home.  Use bright outdoor lighting.  Clear any walking paths of anything that might make someone trip, such as rocks or tools.  Regularly check to see if handrails are loose or broken. Make sure that both sides of any steps have handrails.  Any raised decks and porches should have guardrails on the edges.  Have any  leaves, snow, or ice cleared regularly.  Use sand or salt on walking paths during winter.  Clean up any spills in your garage right away. This includes oil or grease spills. What can I do in the bathroom?  Use night lights.  Install grab bars by the toilet and in the tub and shower. Do not use towel bars as grab bars.  Use non-skid mats or decals in the tub or shower.  If you need to sit down in the shower, use a plastic, non-slip stool.  Keep the floor dry. Clean up any water that spills on the floor as soon as it happens.  Remove soap buildup in the tub or shower regularly.  Attach bath mats securely with double-sided non-slip rug tape.  Do not have throw rugs and other things on the floor that can make you trip. What can I do in the bedroom?  Use night lights.  Make sure that you have a light by your bed that is easy to reach.  Do not use any sheets or blankets that are too big for your bed. They should not hang down onto the floor.  Have a firm chair that has side arms. You can use this for support while you get dressed.  Do not have throw rugs and other  things on the floor that can make you trip. What can I do in the kitchen?  Clean up any spills right away.  Avoid walking on wet floors.  Keep items that you use a lot in easy-to-reach places.  If you need to reach something above you, use a strong step stool that has a grab bar.  Keep electrical cords out of the way.  Do not use floor polish or wax that makes floors slippery. If you must use wax, use non-skid floor wax.  Do not have throw rugs and other things on the floor that can make you trip. What can I do with my stairs?  Do not leave any items on the stairs.  Make sure that there are handrails on both sides of the stairs and use them. Fix handrails that are broken or loose. Make sure that handrails are as long as the stairways.  Check any carpeting to make sure that it is firmly attached to the stairs.  Fix any carpet that is loose or worn.  Avoid having throw rugs at the top or bottom of the stairs. If you do have throw rugs, attach them to the floor with carpet tape.  Make sure that you have a light switch at the top of the stairs and the bottom of the stairs. If you do not have them, ask someone to add them for you. What else can I do to help prevent falls?  Wear shoes that:  Do not have high heels.  Have rubber bottoms.  Are comfortable and fit you well.  Are closed at the toe. Do not wear sandals.  If you use a stepladder:  Make sure that it is fully opened. Do not climb a closed stepladder.  Make sure that both sides of the stepladder are locked into place.  Ask someone to hold it for you, if possible.  Clearly mark and make sure that you can see:  Any grab bars or handrails.  First and last steps.  Where the edge of each step is.  Use tools that help you move around (mobility aids) if they are needed. These include:  Canes.  Walkers.  Scooters.  Crutches.  Turn on the lights when you go into a dark area. Replace any light bulbs as soon as they burn out.  Set up your furniture so you have a clear path. Avoid moving your furniture around.  If any of your floors are uneven, fix them.  If there are any pets around you, be aware of where they are.  Review your medicines with your doctor. Some medicines can make you feel dizzy. This can increase your chance of falling. Ask your doctor what other things that you can do to help prevent falls. This information is not intended to replace advice given to you by your health care provider. Make sure you discuss any questions you have with your health care provider. Document Released: 01/12/2009 Document Revised: 08/24/2015 Document Reviewed: 04/22/2014 Elsevier Interactive Patient Education  2017 [employer].

## 2019-06-25 NOTE — Progress Notes (Addendum)
Subjective:  Patient ID: Jade Sullivan, female    DOB: Mar 17, 1946  Age: 74 y.o. MRN: [id number]  CC:  Chief Complaint  Patient presents with  . Medicare Wellness    Subsequent    HPI Jade Sullivan is a 74 y.o. female who presents for Medicare Annual/Subsequent preventative examination.  Past Medical History:  Diagnosis Date  . Allergy    seasonal  . Arthritis    knees  . Chronic kidney disease   . Diverticulosis of colon (without mention of hemorrhage)   . Dysmetabolic syndrome X   . Gout   . H/O hyperthyroidism    By patient h/o hyperthyroidism with protosis, s/p subtotal thyroidectomy. On no replacement therapy. Last thyroid functions 2013 in [place]  Plan Will need thyroid functions Feb '15 with recommendations to follow.    . Heart murmur    yeras ago told had murmur  . Hyperlipidemia   . Hypertension    on multiple meds  . Hypothyroidism     Past Surgical History:  Procedure Laterality Date  . [place]  . COLONOSCOPY    . EYE SURGERY    . HERNIA REPAIR  2000  . LIPOMA EXCISION Right 11/24/2014   Procedure: EXCISION RIGHT SHOULDER LIPOMA;  Surgeon: Erroll Luna, MD;  Location: [facility];  Service: General;  Laterality: Right;  . POLYPECTOMY    . THYROIDECTOMY, PARTIAL  1981    Family History  Problem Relation Age of Onset  . Diabetes Mother        DM2  . Hypertension Mother   . Pneumonia Mother        died from this condition  . Colon polyps Mother   . Heart disease Father        died from this condition  . Colon polyps Father   . Kidney disease Sister        HBP and DM2; relative died from this condition  . Heart disease Brother        MI - HBP and DM2  . Colon cancer Neg Hx   . Esophageal cancer Neg Hx   . Rectal cancer Neg Hx   . Stomach cancer Neg Hx     Social History   Socioeconomic History  . Marital status: Divorced    Spouse name: Not on file  . Number of children: 1  . Years of education:  Not on file  . Highest education level: Not on file  Occupational History  . Occupation: Retired  Tobacco Use  . Smoking status: Former Research scientist (life sciences)  . Smokeless tobacco: Never Used  Substance and Sexual Activity  . Alcohol use: No    Alcohol/week: 0.0 standard drinks  . Drug use: No  . Sexual activity: Never    Comment: 1st intercourse- 9, partners- 89, divorced  Other Topics Concern  . Not on file  Social History Narrative   Divorced   Retired - Engineer, maintenance (IT)   Social Determinants of Radio broadcast assistant Strain:   . Difficulty of Paying Living Expenses:   Food Insecurity:   . Worried About Charity fundraiser in the Last Year:   . Arboriculturist in the Last Year:   Transportation Needs:   . Film/video editor (Medical):   Marland Kitchen Lack of Transportation (Non-Medical):   Physical Activity:   . Days of Exercise per Week:   . Minutes of Exercise per Session:   Stress:   .  Feeling of Stress :   Social Connections:   . Frequency of Communication with Friends and Family:   . Frequency of Social Gatherings with Friends and Family:   . Attends Religious Services:   . Active Member of Clubs or Organizations:   . Attends Archivist Meetings:   Marland Kitchen Marital Status:   Intimate Partner Violence:   . Fear of Current or Ex-Partner:   . Emotionally Abused:   Marland Kitchen Physically Abused:   . Sexually Abused:     Outpatient Medications Prior to Visit  Medication Sig Dispense Refill  . Aspirin-Acetaminophen-Caffeine (EXCEDRIN PO) Take 1 tablet by mouth as needed (for headache).     Marland Kitchen atorvastatin (LIPITOR) 20 MG tablet Take 1 tablet (20 mg total) by mouth daily. 90 tablet 3  . benzonatate (TESSALON) 100 MG capsule Take 1 capsule (100 mg total) by mouth 3 (three) times daily as needed for cough. 21 capsule 0  . blood glucose meter kit and supplies Dispense based on patient and insurance preference. Use up to four times daily as directed. (FOR ICD-10 E10.9, E11.9). 1 each 0  .  cholecalciferol (VITAMIN D3) 25 MCG (1000 UT) tablet Take 1,000 Units by mouth daily.    . cloNIDine (CATAPRES - DOSED IN MG/24 HR) 0.3 mg/24hr patch Place 0.3 mg onto the skin once a week.     . cloNIDine (CATAPRES - DOSED IN MG/24 HR) 0.3 mg/24hr patch 0.3 mg once a week.    . cloNIDine (CATAPRES) 0.2 MG tablet Take 0.2 mg by mouth 2 (two) times daily.    . fish oil-omega-3 fatty acids 1000 MG capsule Take 2 g by mouth daily.    Marland Kitchen latanoprost (XALATAN) 0.005 % ophthalmic solution Place 1 drop into both eyes at bedtime.     Marland Kitchen levothyroxine (SYNTHROID) 50 MCG tablet Take 1 tablet (50 mcg total) by mouth daily before breakfast. 90 tablet 3  . losartan (COZAAR) 100 MG tablet Take 1 tablet (100 mg total) by mouth daily. 90 tablet 3  . metFORMIN (GLUCOPHAGE) 500 MG tablet Take 1 tablet (500 mg total) by mouth 2 (two) times daily with a meal. 60 tablet 11  . minoxidil (LONITEN) 2.5 MG tablet Take 2.5 mg by mouth 2 (two) times daily.    . Misc Natural Products (TART CHERRY ADVANCED PO) Take by mouth.    . Multiple Vitamin (MULTIVITAMIN) tablet Take 1 tablet by mouth daily.    . nebivolol (BYSTOLIC) 5 MG tablet Take 5 mg by mouth daily.    Marland Kitchen spironolactone (ALDACTONE) 100 MG tablet Take 100 mg by mouth daily.    . timolol (BETIMOL) 0.25 % ophthalmic solution 1-2 drops 2 (two) times daily.     No facility-administered medications prior to visit.    Allergies  Allergen Reactions  . Amlodipine     Gingival hyperplasia  . Hctz [Hydrochlorothiazide]     States caused her to have gout    ROS Review of Systems    Objective:    Physical Exam  BP 140/70 (BP Location: Left Arm, Patient Position: Sitting, Cuff Size: Normal)   Pulse 60   Temp 99.2 F (37.3 C)   Resp 16   Ht '5\' 5"'  (1.651 m)   Wt 208 lb (94.3 kg)   SpO2 98%   BMI 34.61 kg/m  Wt Readings from Last 3 Encounters:  06/25/19 208 lb (94.3 kg)  06/11/19 207 lb 4 oz (94 kg)  05/25/19 209 lb (94.8 kg)  Health Maintenance Due   Topic Date Due  . OPHTHALMOLOGY EXAM  Never done    There are no preventive care reminders to display for this patient.  Lab Results  Component Value Date   TSH 4.23 06/11/2019   Lab Results  Component Value Date   WBC 5.1 06/11/2019   HGB 12.8 06/11/2019   HCT 38.0 06/11/2019   MCV 93.1 06/11/2019   PLT 221.0 06/11/2019   Lab Results  Component Value Date   NA 140 06/11/2019   K 4.2 06/11/2019   CO2 26 06/11/2019   GLUCOSE 141 (H) 06/11/2019   BUN 13 06/11/2019   CREATININE 1.01 06/11/2019   BILITOT 0.8 06/11/2019   ALKPHOS 65 06/11/2019   AST 16 06/11/2019   ALT 9 06/11/2019   PROT 6.8 06/11/2019   ALBUMIN 3.8 06/11/2019   CALCIUM 10.2 06/11/2019   ANIONGAP 7 05/03/2019   GFR 64.91 06/11/2019   Lab Results  Component Value Date   CHOL 138 06/11/2019   Lab Results  Component Value Date   HDL 31.10 (L) 06/11/2019   Lab Results  Component Value Date   LDLCALC 87 06/11/2019   Lab Results  Component Value Date   TRIG 99.0 06/11/2019   Lab Results  Component Value Date   CHOLHDL 4 06/11/2019   Lab Results  Component Value Date   HGBA1C 9.8 (H) 04/14/2019      Assessment & Plan:   Problem List Items Addressed This Visit    None    Visit Diagnoses    Encounter for Medicare annual wellness exam    -  Primary     I have personally reviewed and addressed the Medicare Annual Wellness questionnaire and have noted the following in the patient's chart:  A. Medical and social history B. Use of alcohol, tobacco or illicit drugs  C. Current medications and supplements D. Functional ability and status E.  Nutritional status F.  Physical activity G. Advance directives H. List of other physicians I.  Hospitalizations, surgeries, and ER visits in previous 12 months J.  [place] such as hearing and vision if needed, cognitive and depression L. Referrals, records requested, and appointments-   In addition, I have reviewed and discussed with  patient certain preventive protocols, quality metrics, and best practice recommendations. A written personalized care plan for preventive services as well as general preventive health recommendations were provided to patient.   No orders of the defined types were placed in this encounter.   Follow-up: Return in 1 year (on 06/24/2020).    Sheral Flow, LPN  Nurse Health Advisor

## 2019-07-05 ENCOUNTER — Encounter: Payer: Self-pay | Admitting: Internal Medicine

## 2019-08-13 ENCOUNTER — Encounter: Payer: Self-pay | Admitting: *Deleted

## 2019-08-16 ENCOUNTER — Other Ambulatory Visit: Payer: Self-pay

## 2019-08-16 ENCOUNTER — Ambulatory Visit (INDEPENDENT_AMBULATORY_CARE_PROVIDER_SITE_OTHER): Payer: Medicare Other | Admitting: Internal Medicine

## 2019-08-16 ENCOUNTER — Ambulatory Visit (INDEPENDENT_AMBULATORY_CARE_PROVIDER_SITE_OTHER)
Admission: RE | Admit: 2019-08-16 | Discharge: 2019-08-16 | Disposition: A | Discharge status: Home / Self Care | Payer: Medicare Other | Source: Ambulatory Visit | Attending: Internal Medicine | Admitting: Internal Medicine

## 2019-08-16 ENCOUNTER — Encounter: Payer: Self-pay | Admitting: Internal Medicine

## 2019-08-16 VITALS — BP 152/100 | HR 68 | Temp 99.0°F | Ht 65.0 in | Wt 208.6 lb

## 2019-08-16 DIAGNOSIS — I129 Hypertensive chronic kidney disease with stage 1 through stage 4 chronic kidney disease, or unspecified chronic kidney disease: Secondary | ICD-10-CM

## 2019-08-16 DIAGNOSIS — U071 COVID-19: Secondary | ICD-10-CM

## 2019-08-16 DIAGNOSIS — J1282 Pneumonia due to coronavirus disease 2019: Secondary | ICD-10-CM

## 2019-08-16 DIAGNOSIS — K769 Liver disease, unspecified: Secondary | ICD-10-CM | POA: Diagnosis not present

## 2019-08-16 NOTE — Progress Notes (Signed)
   Subjective:   Patient ID: Jade Sullivan, female    DOB: April 10, 1945, 74 y.o.   MRN: [id number]  HPI The patient is a 74 YO female coming in for follow up x-ray abnormal (had findings of pneumonia with covid-19, 1 month follow up was not cleared, due for follow up, overall breathing is doing much better and she is able to walk longer distances without SOB, still using oxygen at night time and wants to know if she needs to continue this). She is meeting with GI in a week or two about the cirrhosis finding while sick with covid-19. Limiting tylenol to 2000 mg daily.   Review of Systems  Constitutional: Negative.   HENT: Negative.   Eyes: Negative.   Respiratory: Negative for cough, chest tightness and shortness of breath.   Cardiovascular: Negative for chest pain, palpitations and leg swelling.  Gastrointestinal: Negative for abdominal distention, abdominal pain, constipation, diarrhea, nausea and vomiting.  Musculoskeletal: Negative.   Skin: Negative.   Neurological: Negative.   Psychiatric/Behavioral: Negative.     Objective:  Physical Exam Constitutional:      Appearance: She is well-developed.  HENT:     Head: Normocephalic and atraumatic.  Cardiovascular:     Rate and Rhythm: Normal rate and regular rhythm.  Pulmonary:     Effort: Pulmonary effort is normal. No respiratory distress.     Breath sounds: Normal breath sounds. No wheezing or rales.  Abdominal:     General: Bowel sounds are normal. There is no distension.     Palpations: Abdomen is soft.     Tenderness: There is no abdominal tenderness. There is no rebound.  Musculoskeletal:     Cervical back: Normal range of motion.  Skin:    General: Skin is warm and dry.  Neurological:     Mental Status: She is alert and oriented to person, place, and time.     Coordination: Coordination normal.     Vitals:   08/16/19 1028  BP: (!) 152/100  Pulse: 68  Temp: 99 F (37.2 C)  SpO2: 98%  Weight: 208 lb 9.6 oz (94.6  kg)  Height: 5\' 5"  (1.651 m)    This visit occurred during the SARS-CoV-2 public health emergency.  Safety protocols were in place, including screening questions prior to the visit, additional usage of staff PPE, and extensive cleaning of exam room while observing appropriate contact time as indicated for disinfecting solutions.   Assessment & Plan:  Visit time 25 minutes in face to face communication with patient and coordination of care, additional 5 minutes spent in record review, coordination or care, ordering tests, communicating/referring to other healthcare professionals, documenting in medical records all on the same day of the visit for total time 30 minutes spent on the visit.

## 2019-08-16 NOTE — Assessment & Plan Note (Signed)
Has been taking meds regularly and BP running high in the evening. Symptom free with normal renal function recently. She will call nephrologist for adjustment

## 2019-08-16 NOTE — Assessment & Plan Note (Signed)
CXR ordered and breathing getting back to normal. Needs overnight pulse oximetry to see if she still needs needs night time oxygen. Currently using 1.5 L at night time but no daytime oxygen.

## 2019-08-16 NOTE — Assessment & Plan Note (Signed)
Seeing GI in next 1-2 weeks, no signs of jaundice clinically.

## 2019-08-16 NOTE — Patient Instructions (Addendum)
We will do the overnight oxygen test to check if you still need it.   We will check the chest x-ray so go to the [facility] location to get this done anytime M-F 8-4:30.

## 2019-08-17 ENCOUNTER — Ambulatory Visit: Payer: Medicare Other | Admitting: Internal Medicine

## 2019-08-19 ENCOUNTER — Telehealth: Payer: Self-pay

## 2019-08-19 NOTE — Telephone Encounter (Signed)
Order for oxygen was sent to [facility].  Patient currently receives oxygen through [facility] will resend order to [facility]

## 2019-08-25 ENCOUNTER — Other Ambulatory Visit (INDEPENDENT_AMBULATORY_CARE_PROVIDER_SITE_OTHER): Payer: Medicare Other

## 2019-08-25 ENCOUNTER — Ambulatory Visit: Payer: Medicare Other | Admitting: Internal Medicine

## 2019-08-25 ENCOUNTER — Encounter: Payer: Self-pay | Admitting: Internal Medicine

## 2019-08-25 VITALS — BP 130/90 | HR 68 | Ht 65.0 in | Wt 210.0 lb

## 2019-08-25 DIAGNOSIS — R935 Abnormal findings on diagnostic imaging of other abdominal regions, including retroperitoneum: Secondary | ICD-10-CM | POA: Diagnosis not present

## 2019-08-25 DIAGNOSIS — R932 Abnormal findings on diagnostic imaging of liver and biliary tract: Secondary | ICD-10-CM

## 2019-08-25 DIAGNOSIS — Z86010 Personal history of colonic polyps: Secondary | ICD-10-CM | POA: Diagnosis not present

## 2019-08-25 DIAGNOSIS — Z8616 Personal history of COVID-19: Secondary | ICD-10-CM

## 2019-08-25 LAB — CBC WITH DIFFERENTIAL/PLATELET
Basophils Absolute: 0 10*3/uL (ref 0.0–0.1)
Basophils Relative: 1.1 % (ref 0.0–3.0)
Eosinophils Absolute: 0.2 10*3/uL (ref 0.0–0.7)
Eosinophils Relative: 3.4 % (ref 0.0–5.0)
HCT: 37.3 % (ref 36.0–46.0)
Hemoglobin: 12.6 g/dL (ref 12.0–15.0)
Lymphocytes Relative: 32.3 % (ref 12.0–46.0)
Lymphs Abs: 1.5 10*3/uL (ref 0.7–4.0)
MCHC: 33.9 g/dL (ref 30.0–36.0)
MCV: 90.4 fl (ref 78.0–100.0)
Monocytes Absolute: 0.3 10*3/uL (ref 0.1–1.0)
Monocytes Relative: 7.4 % (ref 3.0–12.0)
Neutro Abs: 2.5 10*3/uL (ref 1.4–7.7)
Neutrophils Relative %: 55.8 % (ref 43.0–77.0)
Platelets: 168 10*3/uL (ref 150.0–400.0)
RBC: 4.12 Mil/uL (ref 3.87–5.11)
RDW: 14.2 % (ref 11.5–15.5)
WBC: 4.5 10*3/uL (ref 4.0–10.5)

## 2019-08-25 LAB — PROTIME-INR
INR: 1.1 ratio — ABNORMAL HIGH (ref 0.8–1.0)
Prothrombin Time: 11.8 s (ref 9.6–13.1)

## 2019-08-25 LAB — COMPREHENSIVE METABOLIC PANEL
ALT: 9 U/L (ref 0–35)
AST: 14 U/L (ref 0–37)
Albumin: 4.1 g/dL (ref 3.5–5.2)
Alkaline Phosphatase: 65 U/L (ref 39–117)
BUN: 16 mg/dL (ref 6–23)
CO2: 26 mEq/L (ref 19–32)
Calcium: 10 mg/dL (ref 8.4–10.5)
Chloride: 107 mEq/L (ref 96–112)
Creatinine, Ser: 0.99 mg/dL (ref 0.40–1.20)
GFR: 66.39 mL/min (ref >60.00)
Glucose, Bld: 139 mg/dL — ABNORMAL HIGH (ref 70–99)
Potassium: 3.9 mEq/L (ref 3.5–5.1)
Sodium: 141 mEq/L (ref 135–145)
Total Bilirubin: 0.6 mg/dL (ref 0.2–1.2)
Total Protein: 6.7 g/dL (ref 6.0–8.3)

## 2019-08-25 LAB — IBC + FERRITIN
Ferritin: 30.7 ng/mL (ref 10.0–291.0)
Iron: 60 ug/dL (ref 42–145)
Saturation Ratios: 18.5 % — ABNORMAL LOW (ref 20.0–50.0)
Transferrin: 232 mg/dL (ref 212.0–360.0)

## 2019-08-25 NOTE — Progress Notes (Signed)
Patient ID: Jade Sullivan, female   DOB: December 02, 1945, 74 y.o.   MRN: [id number] HPI: Jade Sullivan is a 74 year old female with a history of adenomatous colon polyp, colonic diverticulosis, CKD, hypertension, hyperlipidemia, diabetes, COVID-19 infection in January 2021 remaining on nocturnal oxygen who is seen in consult at the request of Dr. [name] to evaluate possible cirrhosis suggested by ultrasound.  She is here today with her son.  She was hospitalized with severe COVID-19 pneumonia in January 2021 for about 7 days.  She received IV steroids and remdesivir.  She has slowly recovered and will be getting COVID-19 vaccination later today.  She has remained on nocturnal oxygen which she hopes she will not need long-term.  Her breathing has improved and she has increasing stamina.  She was told that she had an abnormal liver by ultrasound which was performed 2 months ago.  She does not ever recall being told she had a liver issue in her past.  There is no family history of liver disease.  She does not use alcohol and has not used alcohol in over 40 years.  She drank alcohol rarely in her 68s.  She denies a history of jaundice, ascites, confusion, GI bleeding.  No easy bruising.  No abdominal pain.  No heartburn, dysphagia or odynophagia.  Normal bowel movements without blood in stool or melena.  I performed for last colonoscopy on 03/06/2015.  There was a 5 mm adenoma removed from the hepatic flexure and moderate diverticulosis in the descending and sigmoid colon.  A 5-year recall was advised.  Again ultrasound of the abdomen on 06/02/2019 showed a nodular liver contour without discrete lesion.  The portal vein was patent on Doppler with normal directional blood flow towards the liver.  Past Medical History:  Diagnosis Date  . Allergy    seasonal  . Arthritis    knees  . Chronic kidney disease   . Diverticulosis of colon (without mention of hemorrhage)   . Dysmetabolic syndrome X   . Gout   .  H/O hyperthyroidism    By patient h/o hyperthyroidism with protosis, s/p subtotal thyroidectomy. On no replacement therapy. Last thyroid functions 2013 in [place]  Plan Will need thyroid functions Feb '15 with recommendations to follow.    . Heart murmur    yeras ago told had murmur  . Hyperlipidemia   . Hypertension    on multiple meds  . Hypothyroidism   . Tubular adenoma of colon     Past Surgical History:  Procedure Laterality Date  . [place]  . COLONOSCOPY    . EYE SURGERY    . HERNIA REPAIR  2000  . LIPOMA EXCISION Right 11/24/2014   Procedure: EXCISION RIGHT SHOULDER LIPOMA;  Surgeon: Erroll Luna, MD;  Location: [facility];  Service: General;  Laterality: Right;  . POLYPECTOMY    . THYROIDECTOMY, PARTIAL  1981    Outpatient Medications Prior to Visit  Medication Sig Dispense Refill  . Aspirin-Acetaminophen-Caffeine (EXCEDRIN PO) Take 1 tablet by mouth as needed (for headache).     Marland Kitchen atorvastatin (LIPITOR) 20 MG tablet Take 1 tablet (20 mg total) by mouth daily. 90 tablet 3  . cholecalciferol (VITAMIN D3) 25 MCG (1000 UT) tablet Take 1,000 Units by mouth daily.    . cloNIDine (CATAPRES - DOSED IN MG/24 HR) 0.3 mg/24hr patch Place 0.3 mg onto the skin once a week.     . cloNIDine (CATAPRES) 0.2 MG tablet Take 0.2 mg by mouth  2 (two) times daily.    . fish oil-omega-3 fatty acids 1000 MG capsule Take 2 g by mouth daily.    Marland Kitchen latanoprost (XALATAN) 0.005 % ophthalmic solution Place 1 drop into both eyes at bedtime.     Marland Kitchen levothyroxine (SYNTHROID) 50 MCG tablet Take 1 tablet (50 mcg total) by mouth daily before breakfast. 90 tablet 3  . losartan (COZAAR) 100 MG tablet Take 1 tablet (100 mg total) by mouth daily. 90 tablet 3  . metFORMIN (GLUCOPHAGE) 500 MG tablet Take 1 tablet (500 mg total) by mouth 2 (two) times daily with a meal. 60 tablet 11  . minoxidil (LONITEN) 2.5 MG tablet Take 2.5 mg by mouth 2 (two) times daily.    . Misc Natural  Products (TART CHERRY ADVANCED PO) Take by mouth.    . Multiple Vitamin (MULTIVITAMIN) tablet Take 1 tablet by mouth daily.    . nebivolol (BYSTOLIC) 5 MG tablet Take 5 mg by mouth daily.    Marland Kitchen spironolactone (ALDACTONE) 100 MG tablet Take 100 mg by mouth daily.    . timolol (BETIMOL) 0.25 % ophthalmic solution 1-2 drops 2 (two) times daily.    . cloNIDine (CATAPRES - DOSED IN MG/24 HR) 0.3 mg/24hr patch 0.3 mg once a week.    . benzonatate (TESSALON) 100 MG capsule Take 1 capsule (100 mg total) by mouth 3 (three) times daily as needed for cough. (Patient not taking: Reported on 08/16/2019) 21 capsule 0  . blood glucose meter kit and supplies Dispense based on patient and insurance preference. Use up to four times daily as directed. (FOR ICD-10 E10.9, E11.9). 1 each 0   No facility-administered medications prior to visit.    Allergies  Allergen Reactions  . Amlodipine     Gingival hyperplasia  . Hctz [Hydrochlorothiazide]     States caused her to have gout    Family History  Problem Relation Age of Onset  . Diabetes Mother        DM2  . Hypertension Mother   . Pneumonia Mother        died from this condition  . Colon polyps Mother   . Heart disease Father        died from this condition  . Colon polyps Father   . Kidney disease Sister        HBP and DM2; relative died from this condition  . Heart disease Brother        MI - HBP and DM2  . Colon cancer Neg Hx   . Esophageal cancer Neg Hx   . Rectal cancer Neg Hx   . Stomach cancer Neg Hx     Social History   Tobacco Use  . Smoking status: Former Research scientist (life sciences)  . Smokeless tobacco: Never Used  Substance Use Topics  . Alcohol use: No    Alcohol/week: 0.0 standard drinks  . Drug use: No    ROS: As per history of present illness, otherwise negative  BP 130/90   Pulse 68   Ht _0  (1.651 m)   Wt 210 lb (95.3 kg)   BMI 34.95 kg/m  Constitutional: Well-developed and well-nourished. No distress. HEENT: Normocephalic and  atraumatic. Oropharynx is clear and moist. Conjunctivae are normal.  No scleral icterus. Neck: Neck supple. Trachea midline. Cardiovascular: Normal rate, regular rhythm and intact distal pulses. No M/R/G Pulmonary/chest: Effort normal and breath sounds normal. No wheezing, rales or rhonchi. Abdominal: Soft, nontender, nondistended. Bowel sounds active throughout. There are no masses palpable.  No hepatosplenomegaly. Extremities: no clubbing, cyanosis, or edema Neurological: Alert and oriented to person place and time. Skin: Skin is warm and dry.  Psychiatric: Normal mood and affect. Behavior is normal.  RELEVANT LABS AND IMAGING: CBC    Component Value Date/Time   WBC 5.1 06/11/2019 1012   RBC 4.08 06/11/2019 1012   HGB 12.8 06/11/2019 1012   HCT 38.0 06/11/2019 1012   PLT 221.0 06/11/2019 1012   MCV 93.1 06/11/2019 1012   MCH 30.8 05/03/2019 1255   MCHC 33.8 06/11/2019 1012   RDW 16.1 (H) 06/11/2019 1012   LYMPHSABS 1.0 05/03/2019 1255   MONOABS 0.3 05/03/2019 1255   EOSABS 0.1 05/03/2019 1255   BASOSABS 0.0 05/03/2019 1255    CMP     Component Value Date/Time   NA 140 06/11/2019 1012   NA 139 11/11/2017 0000   K 4.2 06/11/2019 1012   CL 108 06/11/2019 1012   CO2 26 06/11/2019 1012   GLUCOSE 141 (H) 06/11/2019 1012   BUN 13 06/11/2019 1012   BUN 15 11/11/2017 0000   CREATININE 1.01 06/11/2019 1012   CALCIUM 10.2 06/11/2019 1012   PROT 6.8 06/11/2019 1012   ALBUMIN 3.8 06/11/2019 1012   AST 16 06/11/2019 1012   ALT 9 06/11/2019 1012   ALKPHOS 65 06/11/2019 1012   BILITOT 0.8 06/11/2019 1012   GFRNONAA 44 (L) 05/03/2019 1255   GFRAA 51 (L) 05/03/2019 1255  ULTRASOUND ABDOMEN LIMITED RIGHT UPPER QUADRANT   COMPARISON:  Chest CT 04/19/2019.   FINDINGS: Gallbladder:   No gallstones or wall thickening visualized. No sonographic Murphy sign noted by sonographer.   Common bile duct:   Diameter: 5 mm, normal.   Liver:   Nodular liver contour redemonstrated  (image 65). No discrete liver lesion. Portal vein is patent on color Doppler imaging with normal direction of blood flow towards the liver.   Other: Large exophytic right renal upper pole cyst redemonstrated with no definite vascular elements on brief Doppler interrogation. No free fluid.   IMPRESSION: 1. Nodular liver contour again raising suspicion of cirrhosis. No discrete liver lesion. 2. Otherwise negative right upper quadrant ultrasound. 3. Partially visible large but benign appearing right renal cyst(s).     Electronically Signed   By: Genevie Ann M.D.   On: 06/02/2019 14:57   ASSESSMENT/PLAN: 74 year old female with a history of adenomatous colon polyp, colonic diverticulosis, CKD, hypertension, hyperlipidemia, diabetes, COVID-19 infection in January 2021 remaining on nocturnal oxygen who is seen in consult at the request of Dr. [name] to evaluate possible cirrhosis suggested by ultrasound.  1.  Possible cirrhosis/abnormal liver ultrasound --we discussed cirrhosis at length together today.  She does not have any signs of decompensated liver disease or portal hypertension.  She does have risk factors for nonalcoholic fatty liver disease in hypertension, hyperlipidemia and glucose intolerance/diabetes.  I have recommended a complete evaluation to exclude other causes of underlying liver disease and cirrhosis.  Also recommended that we perform cross-sectional imaging of her abdomen with MRI abdomen with and without contrast, hepatic protocol.  We discussed complications of cirrhosis and portal hypertension and how that if we confirm a diagnosis of cirrhosis that she would need routine follow-up with me, routine HCC screening, variceal screening, etc. --CBC, CMP, INR, AFP, iron studies, IgG, ANA, alpha-1 antitrypsin, mitochondrial antibody, anti-smooth muscle antibody, ceruloplasmin, viral hepatitis serologies for hepatitis A, B and C --MRI of the abdomen with and without contrast, hepatic  protocol --Follow-up in August at which point we will  discuss possible endoscopy if indicated at that time for variceal screening  2.  History of colon polyp --surveillance colonoscopy recommended later this year.  If we are performing upper endoscopy for variceal screening we will proceed with colonoscopy on the same day.  3.  History of COVID-19 pneumonia --she has remained on nocturnal oxygen but has slowly improved.  If she remains on nocturnal oxygen I explained that we would need to perform any endoscopic procedures in the outpatient hospital setting with monitored anesthesia care.      [id number], Real [place], [place],  [place] [id number]

## 2019-08-25 NOTE — Patient Instructions (Addendum)
Your provider has requested that you go to the basement level for lab work before leaving today. Press "B" on the elevator. The lab is located at the first door on the left as you exit the elevator.  You have been scheduled for an MRI at [facility] on 09/08/19. Your appointment time is 9:30 am. Please arrive 15 minutes prior to your appointment time for registration purposes. Please make certain not to have anything to eat or drink 6 hours prior to your test. In addition, if you have any metal in your body, have a pacemaker or defibrillator, please be sure to let your ordering physician know. This test typically takes 45 minutes to 1 hour to complete. Should you need to reschedule, please call [phone] to do so.  We will discuss endoscopy/colonoscopy at your follow up appointment.  Please follow up with Dr Hilarie Fredrickson 11/15/19 at 3:00 pm.  If you are age 66 or older, your body mass index should be between 23-30. Your Body mass index is 34.95 kg/m. If this is out of the aforementioned range listed, please consider follow up with your Primary Care Provider.  If you are age 14 or younger, your body mass index should be between 19-25. Your Body mass index is 34.95 kg/m. If this is out of the aformentioned range listed, please consider follow up with your Primary Care Provider.   Due to recent changes in healthcare laws, you may see the results of your imaging and laboratory studies on MyChart before your provider has had a chance to review them.  We understand that in some cases there may be results that are confusing or concerning to you. Not all laboratory results come back in the same time frame and the provider may be waiting for multiple results in order to interpret others.  Please give [place] 48 hours in order for your provider to thoroughly review all the results before contacting the office for clarification of your results.

## 2019-08-28 LAB — ANTI-NUCLEAR AB-TITER (ANA TITER)
ANA TITER: 1:80 {titer} — ABNORMAL HIGH
ANA Titer 1: 1:40 {titer} — ABNORMAL HIGH

## 2019-08-28 LAB — HEPATITIS C ANTIBODY
Hepatitis C Ab: NONREACTIVE
SIGNAL TO CUT-OFF: 0.03 (ref <1.00)

## 2019-08-28 LAB — MITOCHONDRIAL ANTIBODIES: Mitochondrial M2 Ab, IgG: <=20 U

## 2019-08-28 LAB — ANTI-SMOOTH MUSCLE ANTIBODY, IGG: Actin (Smooth Muscle) Antibody (IGG): <20 U (ref <20)

## 2019-08-28 LAB — HEPATITIS B CORE ANTIBODY, TOTAL: Hep B Core Total Ab: NONREACTIVE

## 2019-08-28 LAB — ANA: Anti Nuclear Antibody (ANA): POSITIVE — AB

## 2019-08-28 LAB — IGG: IgG (Immunoglobin G), Serum: 1176 mg/dL (ref 600–1540)

## 2019-08-28 LAB — HEPATITIS B SURFACE ANTIBODY,QUALITATIVE: Hep B S Ab: NONREACTIVE

## 2019-08-28 LAB — HEPATITIS A ANTIBODY, TOTAL: Hepatitis A AB,Total: REACTIVE — AB

## 2019-08-28 LAB — HEPATITIS B SURFACE ANTIGEN: Hepatitis B Surface Ag: NONREACTIVE

## 2019-08-28 LAB — CERULOPLASMIN: Ceruloplasmin: 25 mg/dL (ref 18–53)

## 2019-08-28 LAB — AFP TUMOR MARKER: AFP-Tumor Marker: 3.1 ng/mL

## 2019-08-28 LAB — ALPHA-1-ANTITRYPSIN: A-1 Antitrypsin, Ser: 136 mg/dL (ref 83–199)

## 2019-08-31 DIAGNOSIS — H401131 Primary open-angle glaucoma, bilateral, mild stage: Secondary | ICD-10-CM | POA: Diagnosis not present

## 2019-09-02 ENCOUNTER — Other Ambulatory Visit: Payer: Self-pay

## 2019-09-02 DIAGNOSIS — R0902 Hypoxemia: Secondary | ICD-10-CM | POA: Diagnosis not present

## 2019-09-02 DIAGNOSIS — J449 Chronic obstructive pulmonary disease, unspecified: Secondary | ICD-10-CM | POA: Diagnosis not present

## 2019-09-06 ENCOUNTER — Telehealth: Payer: Self-pay | Admitting: Internal Medicine

## 2019-09-06 ENCOUNTER — Ambulatory Visit (INDEPENDENT_AMBULATORY_CARE_PROVIDER_SITE_OTHER): Payer: Medicare Other | Admitting: Internal Medicine

## 2019-09-06 DIAGNOSIS — Z23 Encounter for immunization: Secondary | ICD-10-CM | POA: Diagnosis not present

## 2019-09-06 NOTE — Telephone Encounter (Signed)
Ativan 1 mg p.o. 30 minutes to 1 hour before her MRI This is sedating and she will need transportation to and from her MRI and should not operate heavy machinery on the day she uses this medication

## 2019-09-06 NOTE — Telephone Encounter (Signed)
Dr. Hilarie Fredrickson, Please advise.Thank you.

## 2019-09-06 NOTE — Telephone Encounter (Signed)
Pt had her Hep B injection today. She stated that a med for her anxiety was going to be sent to her pharmacy. Pt states that she is having an MRI and needs something to calm her nerves.

## 2019-09-07 ENCOUNTER — Other Ambulatory Visit: Payer: Self-pay

## 2019-09-07 NOTE — Telephone Encounter (Signed)
Called patient's pharmacy and gave a verbal order over the phone for Ativan 1 mg tablet PO 30 minutes - 1 hour before MRI, only 1 tablet with no refills. Spoke with patient, patient aware of RX sent into pharmacy. Pt states that she does have someone to drive her to and from appointment, pt advised not to operate any heavy machinery on this day.

## 2019-09-08 ENCOUNTER — Ambulatory Visit (HOSPITAL_COMMUNITY)
Admission: RE | Admit: 2019-09-08 | Discharge: 2019-09-08 | Disposition: A | Discharge status: Home / Self Care | Payer: Medicare Other | Source: Ambulatory Visit | Attending: Internal Medicine | Admitting: Internal Medicine

## 2019-09-08 ENCOUNTER — Other Ambulatory Visit: Payer: Self-pay

## 2019-09-08 DIAGNOSIS — R935 Abnormal findings on diagnostic imaging of other abdominal regions, including retroperitoneum: Secondary | ICD-10-CM | POA: Diagnosis not present

## 2019-09-08 DIAGNOSIS — N2889 Other specified disorders of kidney and ureter: Secondary | ICD-10-CM | POA: Diagnosis not present

## 2019-09-08 DIAGNOSIS — Z86010 Personal history of colonic polyps: Secondary | ICD-10-CM | POA: Diagnosis not present

## 2019-09-08 DIAGNOSIS — K862 Cyst of pancreas: Secondary | ICD-10-CM | POA: Diagnosis not present

## 2019-09-08 MED ORDER — GADOBUTROL 1 MMOL/ML IV SOLN
9.0000 mL | Freq: Once | INTRAVENOUS | Status: AC | PRN
  Administered 2019-09-08: 9 mL via INTRAVENOUS

## 2019-10-08 ENCOUNTER — Ambulatory Visit (INDEPENDENT_AMBULATORY_CARE_PROVIDER_SITE_OTHER): Payer: Medicare Other | Admitting: Internal Medicine

## 2019-10-08 DIAGNOSIS — Z23 Encounter for immunization: Secondary | ICD-10-CM | POA: Diagnosis not present

## 2019-10-11 NOTE — Progress Notes (Signed)
Vaccination CMA visit, no MD seen

## 2019-10-20 DIAGNOSIS — U071 COVID-19: Secondary | ICD-10-CM | POA: Diagnosis not present

## 2019-10-21 ENCOUNTER — Encounter: Payer: Self-pay | Admitting: *Deleted

## 2019-10-26 DIAGNOSIS — U071 COVID-19: Secondary | ICD-10-CM | POA: Diagnosis not present

## 2019-11-09 ENCOUNTER — Other Ambulatory Visit: Payer: Self-pay

## 2019-11-09 ENCOUNTER — Ambulatory Visit (INDEPENDENT_AMBULATORY_CARE_PROVIDER_SITE_OTHER): Payer: Medicare Other | Admitting: Internal Medicine

## 2019-11-09 ENCOUNTER — Encounter: Payer: Self-pay | Admitting: Internal Medicine

## 2019-11-09 DIAGNOSIS — N281 Cyst of kidney, acquired: Secondary | ICD-10-CM

## 2019-11-09 DIAGNOSIS — I3139 Other pericardial effusion (noninflammatory): Secondary | ICD-10-CM

## 2019-11-09 DIAGNOSIS — I313 Pericardial effusion (noninflammatory): Secondary | ICD-10-CM | POA: Diagnosis not present

## 2019-11-09 DIAGNOSIS — G4734 Idiopathic sleep related nonobstructive alveolar hypoventilation: Secondary | ICD-10-CM | POA: Diagnosis not present

## 2019-11-09 DIAGNOSIS — K769 Liver disease, unspecified: Secondary | ICD-10-CM

## 2019-11-09 NOTE — Progress Notes (Signed)
   Subjective:   Patient ID: Jade Sullivan, female    DOB: 1945-06-08, 74 y.o.   MRN: [id number]  HPI The patient is a 74 YO female coming in for follow up renal cysts (several noted on recent MRI abdomen ordered by GI, recommended follow up 6 months for stability) and cirrhosis (no changes to indicate cirrhosis on recent MRI, vaccinated against hep b recently through GI, they suspect she has some fatty liver and will be monitoring her) and nocturnal hypoxia (had study done with [employer] for night time oxygen study, has not gotten results, wants to know if we have those, not using oxygen during the daytime currently just at night time, denies SOB or problems with breathing, endurance is increasing).   Call [employer] for results on nocturnal O2 test  Review of Systems  Constitutional: Negative.   HENT: Negative.   Eyes: Negative.   Respiratory: Negative for cough, chest tightness and shortness of breath.   Cardiovascular: Negative for chest pain, palpitations and leg swelling.  Gastrointestinal: Negative for abdominal distention, abdominal pain, constipation, diarrhea, nausea and vomiting.  Musculoskeletal: Negative.   Skin: Negative.   Neurological: Negative.   Psychiatric/Behavioral: Negative.     Objective:  Physical Exam Constitutional:      Appearance: She is well-developed.  HENT:     Head: Normocephalic and atraumatic.  Cardiovascular:     Rate and Rhythm: Normal rate and regular rhythm.  Pulmonary:     Effort: Pulmonary effort is normal. No respiratory distress.     Breath sounds: Normal breath sounds. No wheezing or rales.  Abdominal:     General: Bowel sounds are normal. There is no distension.     Palpations: Abdomen is soft.     Tenderness: There is no abdominal tenderness. There is no rebound.  Musculoskeletal:     Cervical back: Normal range of motion.  Skin:    General: Skin is warm and dry.  Neurological:     Mental Status: She is alert and oriented to person,  place, and time.     Coordination: Coordination normal.     Vitals:   11/09/19 1007  BP: 126/82  Pulse: 69  Temp: 99.1 F (37.3 C)  TempSrc: Oral  SpO2: 96%  Weight: 207 lb (93.9 kg)  Height: 5\' 5"  (1.651 m)    This visit occurred during the SARS-CoV-2 public health emergency.  Safety protocols were in place, including screening questions prior to the visit, additional usage of staff PPE, and extensive cleaning of exam room while observing appropriate contact time as indicated for disinfecting solutions.   Assessment & Plan:

## 2019-11-09 NOTE — Assessment & Plan Note (Signed)
Will track down results of home health study to assess if nocturnal oxygen is still needed. If not, will D/C as she is no longer requiring oxygen on exertion as covid-19 is resolved.

## 2019-11-09 NOTE — Assessment & Plan Note (Signed)
Ordered MRI abdomen to assess in 6 months which would be Dec 2021 at earliest.

## 2019-11-09 NOTE — Assessment & Plan Note (Signed)
Still following with GI, vaccinated fully against hep b since our last vist. 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

## 2019-11-09 NOTE — Assessment & Plan Note (Signed)
Stable on recent MRI imaging and reassurance given.

## 2019-11-09 NOTE — Patient Instructions (Addendum)
We will see you back in about 3 months and recheck the sugars then.  We will recheck the kidney cysts in about 6 months.

## 2019-11-15 ENCOUNTER — Ambulatory Visit: Payer: Medicare Other | Admitting: Internal Medicine

## 2019-11-15 ENCOUNTER — Encounter: Payer: Self-pay | Admitting: Internal Medicine

## 2019-11-15 VITALS — BP 126/84 | HR 65 | Ht 65.0 in | Wt 209.0 lb

## 2019-11-15 DIAGNOSIS — K862 Cyst of pancreas: Secondary | ICD-10-CM | POA: Diagnosis not present

## 2019-11-15 DIAGNOSIS — Z86010 Personal history of colonic polyps: Secondary | ICD-10-CM | POA: Diagnosis not present

## 2019-11-15 DIAGNOSIS — R932 Abnormal findings on diagnostic imaging of liver and biliary tract: Secondary | ICD-10-CM

## 2019-11-15 NOTE — Patient Instructions (Signed)
You will be due for a follow up MRI in June 2023 to recheck your pancreatic cyst. We will contact you when it gets closer to that time.  You will be due for recall colonoscopy at the hospital (due to nighttime oxygen use) in December 2021. We will contact you when it gets closer to that time.  If you are age 74 or older, your body mass index should be between 23-30. Your Body mass index is 34.78 kg/m. If this is out of the aforementioned range listed, please consider follow up with your Primary Care Provider.  If you are age 18 or younger, your body mass index should be between 19-25. Your Body mass index is 34.78 kg/m. If this is out of the aformentioned range listed, please consider follow up with your Primary Care Provider.   Due to recent changes in healthcare laws, you may see the results of your imaging and laboratory studies on MyChart before your provider has had a chance to review them.  We understand that in some cases there may be results that are confusing or concerning to you. Not all laboratory results come back in the same time frame and the provider may be waiting for multiple results in order to interpret others.  Please give [place] 48 hours in order for your provider to thoroughly review all the results before contacting the office for clarification of your results.

## 2019-11-15 NOTE — Progress Notes (Signed)
Subjective:    Patient ID: Jade Sullivan, female    DOB: 01-22-1946, 73 y.o.   MRN: [id number]  HPI Jade Sullivan is a 74 year old female with a history of adenomatous colon polyp, colonic diverticulosis, abnormal ultrasound suggestive of possible cirrhosis, CKD, hypertension, hyperlipidemia, diabetes, COVID-19 infection in January 2021 who is seen for follow-up.  I last saw her on 08/25/2019 to evaluate the abnormal ultrasound and possible cirrhosis.  She is here alone today.  After her last visit I recommended an MRI of her abdomen and also did extensive laboratory evaluation to look for underlying chronic liver disease.  This was largely unremarkable with the exception of a positive ANA at 1-80.  She was not immune to hepatitis B but came back for hepatitis B vaccination series.  She reports she is feeling well.  She is not having any abdominal pain, swelling in her abdomen or lower extremities.  She denies nausea and vomiting.  No constipation or diarrhea.  No rectal bleeding or melena.  No jaundice or dark urine.  Review of Systems As per HPI, otherwise negative  Current Medications, Allergies, Past Medical History, Past Surgical History, Family History and Social History were reviewed in [employer] record.     Objective:   Physical Exam BP 126/84   Pulse 65   Ht 5\' 5"  (1.651 m)   Wt 209 lb (94.8 kg)   BMI 34.78 kg/m  Gen: awake, alert, NAD HEENT: anicteric Neuro: nonfocal  MRI ABDOMEN WITHOUT AND WITH CONTRAST   TECHNIQUE: Multiplanar multisequence MR imaging of the abdomen was performed both before and after the administration of intravenous contrast.   CONTRAST:  48mL GADAVIST GADOBUTROL 1 MMOL/ML IV SOLN   COMPARISON:  Abdominal ultrasound of 06/02/2019. Chest CT 04/19/2019.   FINDINGS: Portions of exam are moderately motion degraded.   Lower chest: Pulmonary artery enlargement, as on CT. Moderate cardiomegaly with small pericardial  effusion.   Hepatobiliary: No evidence of cirrhosis. Scattered liver lesion hepatic cysts. No suspicious. Normal gallbladder, without biliary ductal dilatation.   Pancreas: A cystic lesion within the pancreatic tail measures 8 mm on 23/5. No duct dilatation or acute inflammation.   Spleen:  Normal in size, without focal abnormality.   Adrenals/Urinary Tract: Normal adrenal glands. Bilateral T2 hyperintense renal lesions which are likely cysts. These are larger on the right than left. Example lower pole right renal 4.0 cm cyst. A dominant interpolar right renal minimally complex, multi septated lesion versus adjacent lesions. Example at 6.8 cm on 21/4 and 9.6 cm craniocaudal on 25/3. After contrast, apparent anterior hyperenhancement on 51/32, possibly artifactual. No hydronephrosis.   Stomach/Bowel: Proximal gastric underdistention. Large colonic stool burden. Normal small bowel caliber.   Vascular/Lymphatic: Advanced aortic and branch vessel atherosclerosis. No abdominal adenopathy.   Other:  No ascites.  Susceptibility artifact from prior laparotomy.   Musculoskeletal: Mild S shaped thoracolumbar spine curvature.   IMPRESSION: 1. Moderately motion degraded exam. 2. No evidence of cirrhosis or suspicious liver lesion. 3. Cardiomegaly with small pericardial effusion. 4. Pulmonary artery enlargement suggests pulmonary arterial hypertension. 5. Right larger than left renal lesions, primarily felt to represent cysts. An interpolar right renal multi septated lesion or adjacent more simple lesions is/are indeterminate. Consider follow-up with pre and post contrast abdominal MRI at 6 months. If the patient is not felt able to breath hold and follow directions, renal protocol CT could be performed. 6. 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 Per consensus criteria,  this warrants follow-up with pre and post contrast abdominal MRI/MRCP at 2 years. Alternatively,  this could be re-evaluated on above follow-up CT. This recommendation follows ACR consensus guidelines: Management of Incidental Pancreatic Cysts: A White Paper of the ACR Incidental Findings Committee. J Am Coll Radiol 6789;38:101-751. 7.  Aortic Atherosclerosis (ICD10-I70.0).     Electronically Signed   By: Abigail Miyamoto M.D.   On: 09/08/2019 15:14    CBC    Component Value Date/Time   WBC 4.5 08/25/2019 1126   RBC 4.12 08/25/2019 1126   HGB 12.6 08/25/2019 1126   HCT 37.3 08/25/2019 1126   PLT 168.0 08/25/2019 1126   MCV 90.4 08/25/2019 1126   MCH 30.8 05/03/2019 1255   MCHC 33.9 08/25/2019 1126   RDW 14.2 08/25/2019 1126   LYMPHSABS 1.5 08/25/2019 1126   MONOABS 0.3 08/25/2019 1126   EOSABS 0.2 08/25/2019 1126   BASOSABS 0.0 08/25/2019 1126   CMP     Component Value Date/Time   NA 141 08/25/2019 1126   NA 139 11/11/2017 0000   K 3.9 08/25/2019 1126   CL 107 08/25/2019 1126   CO2 26 08/25/2019 1126   GLUCOSE 139 (H) 08/25/2019 1126   BUN 16 08/25/2019 1126   BUN 15 11/11/2017 0000   CREATININE 0.99 08/25/2019 1126   CALCIUM 10.0 08/25/2019 1126   PROT 6.7 08/25/2019 1126   ALBUMIN 4.1 08/25/2019 1126   AST 14 08/25/2019 1126   ALT 9 08/25/2019 1126   ALKPHOS 65 08/25/2019 1126   BILITOT 0.6 08/25/2019 1126   GFRNONAA 44 (L) 05/03/2019 1255   GFRAA 51 (L) 05/03/2019 1255   Lab Results  Component Value Date   INR 1.1 (H) 08/25/2019    IgG normal, anti-smooth muscle antibody negative, ceruloplasmin normal, hepatitis B testing negative, hepatitis A antibody positive, mitochondrial antibodies negative, alpha 1 antitrypsin normal, ANA positive 1:80, AFP normal        Assessment & Plan:  74 year old female with a history of adenomatous colon polyp, colonic diverticulosis, abnormal ultrasound suggestive of possible cirrhosis, CKD, hypertension, hyperlipidemia, diabetes, COVID-19 infection in January 2021 who is seen for follow-up.  1.  Question of cirrhosis  --after laboratory evaluation her liver enzymes are normal, there is no evidence of portal hypertension on exam and her MRI abdomen with and without contrast does not show evidence of cirrhosis or suspicious liver lesions.  At this point I do not feel that she has advanced liver disease or cirrhosis.  We have discussed this today.  We can intermittently follow liver enzymes and if there is evidence of hepatitis we can reevaluate at that time.  2.  Small pancreatic cyst --there was a very small, 8 mm, pancreatic cyst seen by MRI in the pancreatic tail.  Repeat MRI/MRCP with pre and postcontrast of the abdomen is recommended in 2 years  3.  Renal cysts --follow-up MRI is recommended in 6 months, this will be deferred to primary care.  She reports her primary care provider is aware of this and has plans to repeat imaging in 6 months  4.  History of adenomatous colon polyp/family history of colon polyps in her mother, father, siblings and son --repeat colonoscopy is recommended later this year around December 2021.  This will need to be in the outpatient hospital setting with monitored anesthesia care due to her use of nocturnal oxygen  30 minutes total spent today including patient facing time, coordination of care, reviewing medical history/procedures/pertinent radiology studies, and documentation of the  encounter.

## 2019-11-16 ENCOUNTER — Telehealth: Payer: Self-pay

## 2019-11-16 ENCOUNTER — Ambulatory Visit: Payer: Medicare Other | Admitting: Internal Medicine

## 2019-11-16 NOTE — Telephone Encounter (Signed)
New message    The patient is asking for an explanation as to why MRI has been ordered by Dr.Crawford was not discussed during an office visit.

## 2019-11-16 NOTE — Telephone Encounter (Signed)
Pt has been informed per PCP OV note dated 11/09/19 that the MRI ordered was for the recommended follow up in 66mo (Dec) that was discussed during the visit. Pt was calling for clarification because when the scheduler called her they were trying to set up the MRI for September.

## 2019-11-20 DIAGNOSIS — U071 COVID-19: Secondary | ICD-10-CM | POA: Diagnosis not present

## 2019-11-22 ENCOUNTER — Encounter: Payer: Self-pay | Admitting: Internal Medicine

## 2019-11-26 DIAGNOSIS — U071 COVID-19: Secondary | ICD-10-CM | POA: Diagnosis not present

## 2019-12-21 DIAGNOSIS — U071 COVID-19: Secondary | ICD-10-CM | POA: Diagnosis not present

## 2019-12-27 DIAGNOSIS — N182 Chronic kidney disease, stage 2 (mild): Secondary | ICD-10-CM | POA: Diagnosis not present

## 2019-12-27 DIAGNOSIS — R7303 Prediabetes: Secondary | ICD-10-CM | POA: Diagnosis not present

## 2019-12-27 DIAGNOSIS — U071 COVID-19: Secondary | ICD-10-CM | POA: Diagnosis not present

## 2019-12-27 DIAGNOSIS — I129 Hypertensive chronic kidney disease with stage 1 through stage 4 chronic kidney disease, or unspecified chronic kidney disease: Secondary | ICD-10-CM | POA: Diagnosis not present

## 2019-12-27 DIAGNOSIS — E782 Mixed hyperlipidemia: Secondary | ICD-10-CM | POA: Diagnosis not present

## 2020-01-06 ENCOUNTER — Telehealth: Payer: Self-pay | Admitting: Emergency Medicine

## 2020-01-06 NOTE — Telephone Encounter (Signed)
Error

## 2020-01-07 ENCOUNTER — Other Ambulatory Visit: Payer: Self-pay

## 2020-01-07 ENCOUNTER — Ambulatory Visit
Admission: RE | Admit: 2020-01-07 | Discharge: 2020-01-07 | Disposition: A | Discharge status: Home / Self Care | Payer: Medicare Other | Source: Ambulatory Visit | Attending: Internal Medicine | Admitting: Internal Medicine

## 2020-01-07 DIAGNOSIS — Z1231 Encounter for screening mammogram for malignant neoplasm of breast: Secondary | ICD-10-CM

## 2020-01-20 DIAGNOSIS — U071 COVID-19: Secondary | ICD-10-CM | POA: Diagnosis not present

## 2020-01-26 DIAGNOSIS — U071 COVID-19: Secondary | ICD-10-CM | POA: Diagnosis not present

## 2020-02-07 ENCOUNTER — Other Ambulatory Visit: Payer: Self-pay | Admitting: Internal Medicine

## 2020-02-10 ENCOUNTER — Encounter: Payer: Self-pay | Admitting: Internal Medicine

## 2020-02-10 ENCOUNTER — Ambulatory Visit (INDEPENDENT_AMBULATORY_CARE_PROVIDER_SITE_OTHER): Payer: Medicare Other | Admitting: Internal Medicine

## 2020-02-10 ENCOUNTER — Other Ambulatory Visit: Payer: Self-pay

## 2020-02-10 VITALS — BP 142/80 | HR 64 | Temp 98.8°F | Ht 65.0 in | Wt 207.0 lb

## 2020-02-10 DIAGNOSIS — G4734 Idiopathic sleep related nonobstructive alveolar hypoventilation: Secondary | ICD-10-CM | POA: Diagnosis not present

## 2020-02-10 DIAGNOSIS — I700 Atherosclerosis of aorta: Secondary | ICD-10-CM | POA: Insufficient documentation

## 2020-02-10 DIAGNOSIS — N281 Cyst of kidney, acquired: Secondary | ICD-10-CM | POA: Diagnosis not present

## 2020-02-10 DIAGNOSIS — E1122 Type 2 diabetes mellitus with diabetic chronic kidney disease: Secondary | ICD-10-CM | POA: Diagnosis not present

## 2020-02-10 DIAGNOSIS — N1831 Chronic kidney disease, stage 3a: Secondary | ICD-10-CM | POA: Diagnosis not present

## 2020-02-10 MED ORDER — LORAZEPAM 1 MG PO TABS: 1.0000 mg | ORAL_TABLET | Freq: Once | ORAL | 0 refills | Status: AC

## 2020-02-10 NOTE — Assessment & Plan Note (Signed)
Counseled about recommendation for booster covid-19 shot mid December.

## 2020-02-10 NOTE — Assessment & Plan Note (Signed)
On lipitor 20 mg daily.  ?

## 2020-02-10 NOTE — Patient Instructions (Addendum)
We will see you back in 3 months and the sugars are doing well then we will cut the metformin back to once a day.   We have sent in ativan to take about 30 minutes before the MRI scan in December.   You can try melatonin for sleep which is safe.

## 2020-02-10 NOTE — Assessment & Plan Note (Signed)
Recent HgA1c 5.7 with home health. Advised we can decrease metformin to daily but she wishes to follow up 3 months and if still low decrease at that time.

## 2020-02-10 NOTE — Progress Notes (Signed)
° °  Subjective:   Patient ID: Jade Sullivan, female    DOB: Apr 15, 1945, 74 y.o.   MRN: [id number]  HPI  The patient is a 74 YO female coming in for follow up of her diabetes. She is still taking metformin BID. Denies side effects. Denies new or worsening numbness or tingling in her legs. Denies chest pains. Watching diet somewhat. Denies symptoms of low sugars. Rarely checks sugars but never more than 150 when checked randomly.  5.7 mid October HgA1c from home visit  Review of Systems  Constitutional: Negative.   HENT: Negative.   Eyes: Negative.   Respiratory: Positive for shortness of breath. Negative for cough and chest tightness.        Stable chronic  Cardiovascular: Negative for chest pain, palpitations and leg swelling.  Gastrointestinal: Negative for abdominal distention, abdominal pain, constipation, diarrhea, nausea and vomiting.  Musculoskeletal: Negative.   Skin: Negative.   Neurological: Negative.   Psychiatric/Behavioral: Negative.     Objective:  Physical Exam Constitutional:      Appearance: She is well-developed.  HENT:     Head: Normocephalic and atraumatic.  Cardiovascular:     Rate and Rhythm: Normal rate and regular rhythm.  Pulmonary:     Effort: Pulmonary effort is normal. No respiratory distress.     Breath sounds: Normal breath sounds. No wheezing or rales.  Abdominal:     General: Bowel sounds are normal. There is no distension.     Palpations: Abdomen is soft.     Tenderness: There is no abdominal tenderness. There is no rebound.  Musculoskeletal:     Cervical back: Normal range of motion.  Skin:    General: Skin is warm and dry.  Neurological:     Mental Status: She is alert and oriented to person, place, and time.     Coordination: Coordination normal.     Vitals:   02/10/20 1102  BP: (!) 142/80  Pulse: 64  Temp: 98.8 F (37.1 C)  TempSrc: Oral  SpO2: 98%  Weight: 207 lb (93.9 kg)  Height: 5\' 5"  (1.651 m)    This visit occurred  during the SARS-CoV-2 public health emergency.  Safety protocols were in place, including screening questions prior to the visit, additional usage of staff PPE, and extensive cleaning of exam room while observing appropriate contact time as indicated for disinfecting solutions.   Assessment & Plan:

## 2020-02-10 NOTE — Assessment & Plan Note (Signed)
Rx ativan to take prior to MRI in December as this was effective for her previously.

## 2020-02-20 DIAGNOSIS — U071 COVID-19: Secondary | ICD-10-CM | POA: Diagnosis not present

## 2020-02-26 DIAGNOSIS — U071 COVID-19: Secondary | ICD-10-CM | POA: Diagnosis not present

## 2020-03-09 ENCOUNTER — Other Ambulatory Visit: Payer: Self-pay

## 2020-03-09 ENCOUNTER — Encounter: Payer: Self-pay | Admitting: Obstetrics & Gynecology

## 2020-03-09 ENCOUNTER — Ambulatory Visit: Payer: Medicare Other | Admitting: Obstetrics & Gynecology

## 2020-03-09 VITALS — BP 140/86 | Ht 63.75 in | Wt 213.0 lb

## 2020-03-09 DIAGNOSIS — E6609 Other obesity due to excess calories: Secondary | ICD-10-CM | POA: Diagnosis not present

## 2020-03-09 DIAGNOSIS — Z780 Asymptomatic menopausal state: Secondary | ICD-10-CM

## 2020-03-09 DIAGNOSIS — Z01419 Encounter for gynecological examination (general) (routine) without abnormal findings: Secondary | ICD-10-CM

## 2020-03-09 DIAGNOSIS — R87610 Atypical squamous cells of undetermined significance on cytologic smear of cervix (ASC-US): Secondary | ICD-10-CM | POA: Diagnosis not present

## 2020-03-09 DIAGNOSIS — Z6836 Body mass index (BMI) 36.0-36.9, adult: Secondary | ICD-10-CM

## 2020-03-09 NOTE — Progress Notes (Signed)
Jade Sullivan 1945/05/16 [id number]   History:    74 y.o. G1P1L1 Divorced.  Son moved out of state for a job.  RP:  Established patient presenting for annual gyn exam   HPI: Postmenopause, well on no hormone replacement therapy.  No postmenopausal bleeding.  No pelvic pain.  Abstinent.  Urine and bowel movements normal.  Breasts normal.  Body mass index stable at 36.85, but good muscle mass and bone mass.  Exercising regularly with water aerobics and weightlifting.  Healthy nutrition.  Health labs with nephrologist.  Stage II renal disease. Colonoscopy in 2016, will schedule now.  Had Covid 04/2019.  Sleep apnea using the machine.  Past medical history,surgical history, family history and social history were all reviewed and documented in the EPIC chart.  Gynecologic History No LMP recorded. Patient is postmenopausal.  Obstetric History OB History  Gravida Para Term Preterm AB Living  1 1       1   SAB IAB Ectopic Multiple Live Births               # Outcome Date GA Lbr Len/2nd Weight Sex Delivery Anes PTL Lv  1 Para              ROS: A ROS was performed and pertinent positives and negatives are included in the history.  GENERAL: No fevers or chills. HEENT: No change in vision, no earache, sore throat or sinus congestion. NECK: No pain or stiffness. CARDIOVASCULAR: No chest pain or pressure. No palpitations. PULMONARY: No shortness of breath, cough or wheeze. GASTROINTESTINAL: No abdominal pain, nausea, vomiting or diarrhea, melena or bright red blood per rectum. GENITOURINARY: No urinary frequency, urgency, hesitancy or dysuria. MUSCULOSKELETAL: No joint or muscle pain, no back pain, no recent trauma. DERMATOLOGIC: No rash, no itching, no lesions. ENDOCRINE: No polyuria, polydipsia, no heat or cold intolerance. No recent change in weight. HEMATOLOGICAL: No anemia or easy bruising or bleeding. NEUROLOGIC: No headache, seizures, numbness, tingling or weakness. PSYCHIATRIC: No  depression, no loss of interest in normal activity or change in sleep pattern.     Exam:   BP 140/86   Ht 5' 3.75" (1.619 m)   Wt 213 lb (96.6 kg)   BMI 36.85 kg/m   Body mass index is 36.85 kg/m.  General appearance : Well developed well nourished female. No acute distress HEENT: Eyes: no retinal hemorrhage or exudates,  Neck supple, trachea midline, no carotid bruits, no thyroidmegaly Lungs: Clear to auscultation, no rhonchi or wheezes, or rib retractions  Heart: Regular rate and rhythm, no murmurs or gallops Breast:Examined in sitting and supine position were symmetrical in appearance, no palpable masses or tenderness,  no skin retraction, no nipple inversion, no nipple discharge, no skin discoloration, no axillary or supraclavicular lymphadenopathy Abdomen: no palpable masses or tenderness, no rebound or guarding Extremities: no edema or skin discoloration or tenderness  Pelvic: Vulva: Normal             Vagina: No gross lesions or discharge  Cervix: No gross lesions or discharge.  Pap reflex done.  Uterus  AV, normal size, shape and consistency, non-tender and mobile  Adnexa  Without masses or tenderness  Anus: Normal   Assessment/Plan:  74 y.o. female for annual exam   1. Encounter for routine gynecological examination with Papanicolaou smear of cervix Normal gynecologic exam in postmenopausal.  Pap reflex done because of ASCUS with negative high-risk HPV last year.  Breast exam normal.  Screening mammogram October 2021 was  negative.  Colonoscopy in 2016.  Health labs with family physician.  2. ASCUS of cervix with negative high risk HPV Pap reflex done today.  3. Postmenopausal Well on no hormone replacement therapy.  No postmenopausal bleeding.  Last bone density in 2017 was normal, will repeat at 5 years.  4. Class 2 obesity due to excess calories without serious comorbidity with body mass index (BMI) of 36.0 to 36.9 in adult Low calorie/carb diet recommended.   Aerobic activities 5 times a week and light weightlifting every 2 days.  Princess Bruins MD, 11:00 AM 03/09/2020

## 2020-03-10 ENCOUNTER — Encounter: Payer: Self-pay | Admitting: Obstetrics & Gynecology

## 2020-03-13 ENCOUNTER — Other Ambulatory Visit: Payer: Self-pay

## 2020-03-13 ENCOUNTER — Ambulatory Visit
Admission: RE | Admit: 2020-03-13 | Discharge: 2020-03-13 | Disposition: A | Discharge status: Home / Self Care | Payer: Medicare Other | Source: Ambulatory Visit | Attending: Internal Medicine | Admitting: Internal Medicine

## 2020-03-13 DIAGNOSIS — N281 Cyst of kidney, acquired: Secondary | ICD-10-CM | POA: Diagnosis not present

## 2020-03-13 LAB — PAP IG W/ RFLX HPV ASCU

## 2020-03-13 MED ORDER — GADOBENATE DIMEGLUMINE 529 MG/ML IV SOLN
20.0000 mL | Freq: Once | INTRAVENOUS | Status: AC | PRN
  Administered 2020-03-13: 20 mL via INTRAVENOUS

## 2020-03-14 ENCOUNTER — Telehealth: Payer: Self-pay | Admitting: Internal Medicine

## 2020-03-14 NOTE — Progress Notes (Signed)
  Chronic Care Management   Note  03/14/2020 Name: Jade Sullivan MRN: [id number] DOB: 1945/09/13  Jade Sullivan is a 74 y.o. year old female who is a primary care patient of Hoyt Koch, MD. I reached out to [name] by phone today in response to a referral sent by [name]'s PCP, Hoyt Koch, MD.   [name] was given information about Chronic Care Management services today including:  1. CCM service includes personalized support from designated clinical staff supervised by her physician, including individualized plan of care and coordination with other care providers 2. 24/7 contact phone numbers for assistance for urgent and routine care needs. 3. Service will only be billed when office clinical staff spend 20 minutes or more in a month to coordinate care. 4. Only one practitioner may furnish and bill the service in a calendar month. 5. The patient may stop CCM services at any time (effective at the end of the month) by phone call to the office staff.   Patient agreed to services and verbal consent obtained.   Follow up plan:   Carley Perdue [employer] Scheduler

## 2020-03-21 DIAGNOSIS — U071 COVID-19: Secondary | ICD-10-CM | POA: Diagnosis not present

## 2020-03-27 DIAGNOSIS — U071 COVID-19: Secondary | ICD-10-CM | POA: Diagnosis not present

## 2020-04-05 ENCOUNTER — Telehealth: Payer: Self-pay | Admitting: Internal Medicine

## 2020-04-05 MED ORDER — METFORMIN HCL 500 MG PO TABS: 500.0000 mg | ORAL_TABLET | Freq: Two times a day (BID) | ORAL | 11 refills | Status: DC

## 2020-04-05 NOTE — Telephone Encounter (Signed)
Refill sent. See meds.  

## 2020-04-05 NOTE — Telephone Encounter (Signed)
[id number] - [place], [place] - [address] AT [facility] [address] Phone:  [phone]  Fax:  [phone]     metFORMIN (GLUCOPHAGE) 500 MG tablet Requesting a refill Last apt- 11.11.21 Next apt- 02.16.22 Requesting a refill

## 2020-04-11 DIAGNOSIS — H401131 Primary open-angle glaucoma, bilateral, mild stage: Secondary | ICD-10-CM | POA: Diagnosis not present

## 2020-04-11 DIAGNOSIS — H2513 Age-related nuclear cataract, bilateral: Secondary | ICD-10-CM | POA: Diagnosis not present

## 2020-04-11 DIAGNOSIS — H04123 Dry eye syndrome of bilateral lacrimal glands: Secondary | ICD-10-CM | POA: Diagnosis not present

## 2020-04-18 ENCOUNTER — Other Ambulatory Visit: Payer: Self-pay | Admitting: Internal Medicine

## 2020-04-21 DIAGNOSIS — U071 COVID-19: Secondary | ICD-10-CM | POA: Diagnosis not present

## 2020-04-25 ENCOUNTER — Ambulatory Visit: Payer: Medicare Other | Admitting: Pharmacist

## 2020-04-25 ENCOUNTER — Other Ambulatory Visit: Payer: Self-pay

## 2020-04-25 DIAGNOSIS — E1122 Type 2 diabetes mellitus with diabetic chronic kidney disease: Secondary | ICD-10-CM

## 2020-04-25 DIAGNOSIS — I700 Atherosclerosis of aorta: Secondary | ICD-10-CM

## 2020-04-25 DIAGNOSIS — E034 Atrophy of thyroid (acquired): Secondary | ICD-10-CM

## 2020-04-25 DIAGNOSIS — I129 Hypertensive chronic kidney disease with stage 1 through stage 4 chronic kidney disease, or unspecified chronic kidney disease: Secondary | ICD-10-CM

## 2020-04-25 DIAGNOSIS — E1169 Type 2 diabetes mellitus with other specified complication: Secondary | ICD-10-CM

## 2020-04-25 DIAGNOSIS — N1831 Chronic kidney disease, stage 3a: Secondary | ICD-10-CM

## 2020-04-25 NOTE — Chronic Care Management (AMB) (Signed)
Chronic Care Management Pharmacy Note  04/25/2020 Name:  Jade Sullivan MRN:  [id number] DOB:  03-21-1946  Subjective: Jade Sullivan is an 75 y.o. year old female who is a primary patient of Hoyt Koch, MD.  The CCM team was consulted for assistance with disease management and care coordination needs.    Engaged with patient by telephone for initial visit in response to provider referral for pharmacy case management and/or care coordination services.   Consent to Services:  The patient was given the following information about Chronic Care Management services today, agreed to services, and gave verbal consent: 1. CCM service includes personalized support from designated clinical staff supervised by the primary care provider, including individualized plan of care and coordination with other care providers 2. 24/7 contact phone numbers for assistance for urgent and routine care needs. 3. Service will only be billed when office clinical staff spend 20 minutes or more in a month to coordinate care. 4. Only one practitioner may furnish and bill the service in a calendar month. 5.The patient may stop CCM services at any time (effective at the end of the month) by phone call to the office staff. 6. The patient will be responsible for cost sharing (co-pay) of up to 20% of the service fee (after annual deductible is met). Patient agreed to services and consent obtained.  Patient Care Team: Hoyt Koch, MD as PCP - General (Internal Medicine) Princess Bruins, MD as Consulting Physician (Obstetrics and Gynecology) Hilarie Fredrickson, Lajuan Lines, MD as Consulting Physician (Gastroenterology) Corliss Parish, MD as Consulting Physician (Nephrology) Associates, [facility] (Ophthalmology) Charlton Haws, [facility] as Pharmacist (Pharmacist)  Patient lives at home alone. Her son works for [employer] and recently moved to [place], he tries to visit every 3-4 weeks and calls all the  time.  Recent office visits: 02/10/20 Dr [name] OV: DM f/u. A1c was 5.7 mid Oct from home health.  11/09/19 Dr [name] OV: chronic f/u. No longer requiring O2 on exertion, covid as resolved.  Recent consult visits: 03/09/20 Dr Dellis Filbert (OB/GYN): breast and pelvic exam normal.  12/27/19 Dr Moshe Cipro (nephrology): f/u for HTN CKD 1  11/15/19 Dr Hilarie Fredrickson (GI): f/u ? Cirrhosis - lab work normal, MRI w/o evidence of cirrhosis. Pancreatic and renal cysts seen on MRI, f/u 6 mos.  Objective:  Lab Results  Component Value Date   CREATININE 0.99 08/25/2019   BUN 16 08/25/2019   GFR 66.39 08/25/2019   GFRNONAA 44 (L) 05/03/2019   GFRAA 51 (L) 05/03/2019   NA 141 08/25/2019   K 3.9 08/25/2019   CALCIUM 10.0 08/25/2019   CO2 26 08/25/2019    Lab Results  Component Value Date/Time   HGBA1C 9.8 (H) 04/14/2019 04:07 AM   HGBA1C 6.7 (H) 05/12/2018 11:34 AM   FRUCTOSAMINE 283 06/11/2019 10:12 AM   GFR 66.39 08/25/2019 11:26 AM   GFR 64.91 06/11/2019 10:12 AM    Last diabetic Eye exam: No results found for: HMDIABEYEEXA  Last diabetic Foot exam: No results found for: HMDIABFOOTEX   Lab Results  Component Value Date   CHOL 138 06/11/2019   HDL 31.10 (L) 06/11/2019   LDLCALC 87 06/11/2019   TRIG 99.0 06/11/2019   CHOLHDL 4 06/11/2019    Hepatic Function Latest Ref Rng & Units 08/25/2019 06/11/2019 05/25/2019  Total Protein 6.0 - 8.3 g/dL 6.7 6.8 6.9  Albumin 3.5 - 5.2 g/dL 4.1 3.8 4.2  AST 0 - 37 U/L '14 16 18  ' ALT 0 -  35 U/L '9 9 12  ' Alk Phosphatase 39 - 117 U/L 65 65 57  Total Bilirubin 0.2 - 1.2 mg/dL 0.6 0.8 1.0  Bilirubin, Direct 0.0 - 0.3 mg/dL - - -    Lab Results  Component Value Date/Time   TSH 4.23 06/11/2019 10:12 AM   TSH 6.55 (H) 05/12/2018 11:34 AM   FREET4 0.78 06/11/2019 10:12 AM   FREET4 0.80 05/12/2018 11:34 AM    CBC Latest Ref Rng & Units 08/25/2019 06/11/2019 05/25/2019  WBC 4.0 - 10.5 K/uL 4.5 5.1 6.5  Hemoglobin 12.0 - 15.0 g/dL 12.6 12.8 13.2  Hematocrit  36.0 - 46.0 % 37.3 38.0 39.7  Platelets 150.0 - 400.0 K/uL 168.0 221.0 188.0    Lab Results  Component Value Date/Time   VD25OH 41.4 11/11/2017 12:00 AM   VD25OH 37.99 03/17/2017 11:30 AM    Clinical ASCVD: No  The 10-year ASCVD risk score Mikey Bussing DC Jr., et al., 2013) is: 22.4%   Values used to calculate the score:     Age: 45 years     Sex: Female     Is Non-Hispanic African American: Yes     Diabetic: Yes     Tobacco smoker: No     Systolic Blood Pressure: 546 mmHg     Is BP treated: Yes     HDL Cholesterol: 31.1 mg/dL     Total Cholesterol: 138 mg/dL     BP Readings from Last 3 Encounters:  03/09/20 140/86  02/10/20 (!) 142/80  11/15/19 126/84   Pulse Readings from Last 3 Encounters:  02/10/20 64  11/15/19 65  11/09/19 69   Wt Readings from Last 3 Encounters:  03/09/20 213 lb (96.6 kg)  02/10/20 207 lb (93.9 kg)  11/15/19 209 lb (94.8 kg)    Assessment/Interventions: Review of patient past medical history, allergies, medications, health status, including review of consultants reports, laboratory and other test data, was performed as part of comprehensive evaluation and provision of chronic care management services.   SDOH:  (Social Determinants of Health) assessments and interventions performed:  SDOH Interventions   Flowsheet Row Most Recent Value  SDOH Interventions   Financial Strain Interventions Intervention Not Indicated     CCM Care Plan  Allergies  Allergen Reactions  . Amlodipine     Gingival hyperplasia  . Hctz [Hydrochlorothiazide]     States caused her to have gout    Medications Reviewed Today    Reviewed by Charlton Haws, [facility] (Pharmacist) on 04/25/20 at 1243  Med List Status: <None>  Medication Order Taking? Sig Documenting Provider Last Dose Status Informant  Aspirin-Acetaminophen-Caffeine (EXCEDRIN PO) [id number] Yes Take 1 tablet by mouth as needed (for headache).  [provider] Taking Active Self  atorvastatin  (LIPITOR) 20 MG tablet [id number] Yes TAKE 1 TABLET(20 MG) BY MOUTH DAILY Hoyt Koch, MD Taking Active   cholecalciferol (VITAMIN D3) 25 MCG (1000 UT) tablet [id number] Yes Take 1,000 Units by mouth daily. [provider] Taking Active Self  cloNIDine (CATAPRES - DOSED IN MG/24 HR) 0.3 mg/24hr patch [id number] Yes Place 0.3 mg onto the skin once a week.  [provider] Taking Active Self           Med Note Serita Sheller DAVIDSON, STEFANNI*   Fri Dec 15, 2015 10:12 AM) Received from: External Pharmacy  cloNIDine (CATAPRES) 0.2 MG tablet [id number] Yes Take 0.2 mg by mouth 2 (two) times daily. [provider] Taking Active Self  fish oil-omega-3 fatty acids 1000 MG  capsule [id number] Yes Take 2 g by mouth daily. [provider] Taking Active Self  latanoprost (XALATAN) 0.005 % ophthalmic solution [id number] Yes Place 1 drop into both eyes at bedtime.  [provider] Taking Active Self  levothyroxine (SYNTHROID) 50 MCG tablet [id number] Yes TAKE 1 TABLET(50 MCG) BY MOUTH DAILY BEFORE BREAKFAST Hoyt Koch, MD Taking Active   losartan (COZAAR) 100 MG tablet [id number] Yes TAKE 1 TABLET(100 MG) BY MOUTH DAILY Hoyt Koch, MD Taking Active   metFORMIN (GLUCOPHAGE) 500 MG tablet [id number] Yes Take 1 tablet (500 mg total) by mouth 2 (two) times daily with a meal. Hoyt Koch, MD Taking Active   minoxidil (LONITEN) 2.5 MG tablet [id number] Yes Take 7.5 mg by mouth 2 (two) times daily. [provider] Taking Active Self  Misc Natural Products (TART CHERRY ADVANCED PO) [id number] Yes Take by mouth. [provider] Taking Active Self  Multiple Vitamin (MULTIVITAMIN) tablet [id number] Yes Take 1 tablet by mouth daily. [provider] Taking Active Self  nebivolol (BYSTOLIC) 5 MG tablet [id number] Yes Take 5 mg by mouth daily. [provider] Taking Active Self           Med Note Thedore Mins, Simona Huh   Tue  Apr 13, 2019 10:18 PM) Unknown time.   spironolactone (ALDACTONE) 100 MG tablet [id number] Yes Take 100 mg by mouth daily. [provider] Taking Active Self  timolol (BETIMOL) 0.25 % ophthalmic solution [id number] Yes 1-2 drops 2 (two) times daily. [provider] Taking Active Self          Patient Active Problem List   Diagnosis Date Noted  . Aortic atherosclerosis ([place]) 02/10/2020  . Acquired cyst of kidney 11/09/2019  . Nocturnal hypoxia 11/09/2019  . Liver problem 06/11/2019  . Pericardial effusion 05/06/2019  . Routine general medical examination at a health care facility 03/17/2017  . History of acute gouty arthritis 11/03/2014  . Hypothyroidism 09/28/2014  . Lipoma of shoulder 09/27/2014  . H/O hyperthyroidism 02/17/2013  . Hyperlipidemia associated with type 2 diabetes mellitus ([place]) 11/16/2012  . Diabetes mellitus ([place]) 11/16/2012  . 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 Obesity, unspecified 10/27/2012  . CKD (chronic kidney disease) stage 3, GFR 30-59 ml/min (HCC) 10/27/2012  . Hypertension, renal disease     Immunization History  Administered Date(s) Administered  . Hepb-cpg 09/06/2019, 10/08/2019  . Influenza Split 01/22/2012  . Influenza, High Dose Seasonal PF 03/17/2017  . Influenza,inj,Quad PF,6+ Mos 01/21/2016, 01/25/2018, 01/15/2019  . Influenza-Unspecified 12/30/2012  . PFIZER(Purple Top)SARS-COV-2 Vaccination 08/25/2019, 09/15/2019, 03/26/2020  . Pneumococcal Conjugate-13 08/29/2015  . Pneumococcal Polysaccharide-23 01/22/2012  . Td 09/06/2013    Conditions to be addressed/monitored:  CAD, HTN, HLD, DMII and Hypothyroidism  Patient Care Plan: CCM Pharmacy Care Plan    Problem Identified: HTN, HLD, CAD, DM2, hypothyroidism   Priority: High    Goal: Patient-Specific Goal   Start Date: 04/25/2020  Expected End Date: 07/24/2020  This Visit's Progress: On track  Priority: High  Note:   Current Barriers:  . Unable  to independently monitor therapeutic efficacy . Unable to achieve control of BP  . Suboptimal therapeutic regimen for BP  Pharmacist Clinical Goal(s):  Marland Kitchen Over the next 90 days, patient will achieve adherence to monitoring guidelines and medication adherence to achieve therapeutic efficacy . achieve control of BP as evidenced by patient-reported BP log through collaboration with PharmD and provider.   Interventions: . 1:1 collaboration with Hoyt Koch, MD  regarding development and update of comprehensive plan of care as evidenced by provider attestation and co-signature . Inter-disciplinary care team collaboration (see longitudinal plan of care) . Comprehensive medication review performed; medication list updated in electronic medical record  Hypertension (BP goal <140/90) -uncontrolled  -BP meds per nephrology (Dr Moshe Cipro) -Current treatment:  . Clonidine patch 0.3 mg/24hr weekly . Clonidine 0.2 mg TID . Losartan 100 mg daily (PCP) . Minoxidil 2.5 mg - 3 tab BID . Bystolic 5 mg daily . Spironolactone 100 mg daily -Medications previously tried: amlodipine, HCTZ, benazepril, carvedilol, hydralazine -Current home readings: checks 2-3 times daily; 156/90, 160/94, 130/86 -Current dietary habits: avoids salt -Current exercise habits: limited during pandemic; used to go to water aerobics, she just bought a exercise bikes -Denies hypotensive/hypertensive symptoms -Educated on BP goals and benefits of medications for prevention of heart attack, stroke and kidney damage; Daily salt intake goal < 2300 mg; Exercise goal of 150 minutes per week; Importance of home blood pressure monitoring; -Counseled to monitor BP at home daily, document, and provide log at future appointments -Recommended to change losartan 100 mg to Irbesartan 300 mg to improve BP lowering  Hyperlipidemia / CAD: (LDL goal < 70) -uncontrolled  -aortic atherosclerosis on CT 04/2019 -Current  treatment: . Atorvastatin 20 mg daily . OTC omega-3 fish oil 1000 mg -Medications previously tried: none -Educated on Cholesterol goals;  Benefits of statin for ASCVD risk reduction; Importance of limiting foods high in cholesterol;. -Counseled on diet and exercise extensively Recommended to continue current medication; IF LDL remains > 70 at next check recommend increasing atorvastatin to 40 mg  Diabetes (A1c goal <7%) -controlled -Current medications: Marland Kitchen Metformin 500 mg BID -Medications previously tried: Januvia  -Current home glucose readings . fasting glucose: 136 . post prandial glucose: <150 -Denies hypoglycemic/hyperglycemic symptoms -Current meal patterns: chicken, [place], green vegetables -Current exercise: limited currently but plans to start using exercise bike -Educated onA1c and blood sugar goals; Complications of diabetes including kidney damage, retinal damage, and cardiovascular disease; -Counseled to check feet daily and get yearly eye exams -Counseled on diet and exercise extensively Recommended to continue current medication ; if A1c < 6.5 at next check may consider reduce metformin to once daily  Hypothyroidism (Goal: maintain TSH in goal range) -controlled -Current treatment  . Levothyroxine 50 mcg daily 30-60 min before other meds -Counseled on optimal timing of administration -Recommended to continue current medication  Glaucoma (Goal: prevent disease progression) -controlled -Current treatment  . Latanoprost 0.005% eye drops  . Timolol 0.25% eye drops -Recommended to continue current medication  Health Maintenance -Vaccine gaps: Covid booster (12/26), Shingrix, Flu -Current therapy:  Marland Kitchen Vitamin D 1000 IU daily . Multivitamin . Vitamin B12 . Elderberry . Excedrin (ASA-APAP-caffeine) prn -Patient is satisfied with current therapy and denies issues -Recommended to continue current medication  Patient Goals/Self-Care Activities . Over the next  90 days, patient will:  - take medications as prescribed check blood pressure daily, document, and provide at future appointments target a minimum of 150 minutes of moderate intensity exercise weekly  Follow Up Plan: Telephone follow up appointment with care management team member scheduled for: 3 months     Medication Assistance: None required.  Patient affirms current coverage meets needs.  Patient's preferred pharmacy is:  [id number] - [address], [address] [place] [address] Phone: [phone] Fax: [phone]  Uses pill box? Yes Pt endorses  100% compliance  We discussed: Discussed benefits of medication synchronization, packaging and delivery as well as enhanced pharmacist oversight with [employer].  Plan: Continue current medication management strategy    Follow Up:  Patient agrees to Care Plan and Follow-up.  Plan: Telephone follow up appointment with care management team member scheduled for:  3 months  Charlene Brooke, PharmD, [facility] Clinical Pharmacist [facility] [phone]

## 2020-04-25 NOTE — Patient Instructions (Addendum)
Visit Information  Phone number for Pharmacist: [phone]  Thank you for meeting with me to discuss your medications! I look forward to working with you to achieve your health care goals. Below is a summary of what we talked about during the visit:  Goals Addressed            This Visit's Progress   . Manage My Medicine       Timeframe:  Long-Range Goal Priority:  Medium Start Date:   04/25/20                          Expected End Date:   07/24/20                     - call for medicine refill 2 or 3 days before it runs out - call if I am sick and can't take my medicine - keep a list of all the medicines I take; vitamins and herbals too - use a pillbox to sort medicine    Why is this important?   . These steps will help you keep on track with your medicines.    . Track and Manage My Blood Pressure-Hypertension       Timeframe:  Long-Range Goal Priority:  High Start Date:    04/25/20                         Expected End Date:    07/24/20                    - check blood pressure daily - choose a place to take my blood pressure (home, clinic or office, retail store) - write blood pressure results in a log or diary    Why is this important?    You won't feel high blood pressure, but it can still hurt your blood vessels.   High blood pressure can cause heart or kidney problems. It can also cause a stroke.   Making lifestyle changes like losing a little weight or eating less salt will help.   Checking your blood pressure at home and at different times of the day can help to control blood pressure.   If the doctor prescribes medicine remember to take it the way the doctor ordered.   Call the office if you cannot afford the medicine or if there are questions about it.        Patient Care Plan: CCM Pharmacy Care Plan    Problem Identified: HTN, HLD, CAD, DM2, hypothyroidism   Priority: High    Goal: Patient-Specific Goal   Start Date: 04/25/2020  Expected End Date:  07/24/2020  This Visit's Progress: On track  Priority: High  Note:   Current Barriers:  . Unable to independently monitor therapeutic efficacy . Unable to achieve control of BP  . Suboptimal therapeutic regimen for BP  Pharmacist Clinical Goal(s):  Marland Kitchen Over the next 90 days, patient will achieve adherence to monitoring guidelines and medication adherence to achieve therapeutic efficacy . achieve control of BP as evidenced by patient-reported BP log through collaboration with PharmD and provider.   Interventions: . 1:1 collaboration with Hoyt Koch, MD regarding development and update of comprehensive plan of care as evidenced by provider attestation and co-signature . Inter-disciplinary care team collaboration (see longitudinal plan of care) . Comprehensive medication review performed; medication list updated in electronic medical record  Hypertension (BP goal <  140/90) -uncontrolled  -BP meds per nephrology (Dr Moshe Cipro) -Current treatment:  . Clonidine patch 0.3 mg/24hr weekly . Clonidine 0.2 mg TID . Losartan 100 mg daily (PCP) . Minoxidil 2.5 mg - 3 tab BID . Bystolic 5 mg daily . Spironolactone 100 mg daily -Medications previously tried: amlodipine, HCTZ, benazepril, carvedilol, hydralazine -Current home readings: checks 2-3 times daily; 156/90, 160/94, 130/86 -Current dietary habits: avoids salt -Current exercise habits: limited during pandemic; used to go to water aerobics, she just bought a exercise bikes -Denies hypotensive/hypertensive symptoms -Educated on BP goals and benefits of medications for prevention of heart attack, stroke and kidney damage; Daily salt intake goal < 2300 mg; Exercise goal of 150 minutes per week; Importance of home blood pressure monitoring; -Counseled to monitor BP at home daily, document, and provide log at future appointments -Recommended to change losartan 100 mg to Irbesartan 300 mg to improve BP lowering  Hyperlipidemia /  CAD: (LDL goal < 70) -uncontrolled  -aortic atherosclerosis on CT 04/2019 -Current treatment: . Atorvastatin 20 mg daily . OTC omega-3 fish oil 1000 mg -Medications previously tried: none -Educated on Cholesterol goals;  Benefits of statin for ASCVD risk reduction; Importance of limiting foods high in cholesterol;. -Counseled on diet and exercise extensively Recommended to continue current medication; IF LDL remains > 70 at next check recommend increasing atorvastatin to 40 mg  Diabetes (A1c goal <7%) -controlled -Current medications: Marland Kitchen Metformin 500 mg BID -Medications previously tried: Januvia  -Current home glucose readings . fasting glucose: 136 . post prandial glucose: <150 -Denies hypoglycemic/hyperglycemic symptoms -Current meal patterns: chicken, [place], green vegetables -Current exercise: limited currently but plans to start using exercise bike -Educated onA1c and blood sugar goals; Complications of diabetes including kidney damage, retinal damage, and cardiovascular disease; -Counseled to check feet daily and get yearly eye exams -Counseled on diet and exercise extensively Recommended to continue current medication ; if A1c < 6.5 at next check may consider reduce metformin to once daily  Hypothyroidism (Goal: maintain TSH in goal range) -controlled -Current treatment  . Levothyroxine 50 mcg daily 30-60 min before other meds -Counseled on optimal timing of administration -Recommended to continue current medication  Glaucoma (Goal: prevent disease progression) -controlled -Current treatment  . Latanoprost 0.005% eye drops  . Timolol 0.25% eye drops -Recommended to continue current medication  Health Maintenance -Vaccine gaps: Covid booster (12/26), Shingrix, Flu -Current therapy:  Marland Kitchen Vitamin D 1000 IU daily . Multivitamin . Vitamin B12 . Elderberry . Excedrin (ASA-APAP-caffeine) prn -Patient is satisfied with current therapy and denies issues -Recommended to  continue current medication  Patient Goals/Self-Care Activities . Over the next 90 days, patient will:  - take medications as prescribed check blood pressure daily, document, and provide at future appointments target a minimum of 150 minutes of moderate intensity exercise weekly  Follow Up Plan: Telephone follow up appointment with care management team member scheduled for: 3 months     Jade Sullivan was given information about Chronic Care Management services today including:  1. CCM service includes personalized support from designated clinical staff supervised by her physician, including individualized plan of care and coordination with other care providers 2. 24/7 contact phone numbers for assistance for urgent and routine care needs. 3. Standard insurance, coinsurance, copays and deductibles apply for chronic care management only during months in which we provide at least 20 minutes of these services. Most insurances cover these services at 100%, however patients may be responsible for any copay, coinsurance and/or deductible if applicable.  This service may help you avoid the need for more expensive face-to-face services. 4. Only one practitioner may furnish and bill the service in a calendar month. 5. The patient may stop CCM services at any time (effective at the end of the month) by phone call to the office staff.  Patient agreed to services and verbal consent obtained.   The patient verbalized understanding of instructions, educational materials, and care plan provided today and agreed to receive a mailed copy of patient instructions, educational materials, and care plan.  Telephone follow up appointment with pharmacy team member scheduled for: 3 months  Charlene Brooke, PharmD, [facility] Clinical Pharmacist [facility] [phone]  [email or web address]">  DASH Eating Plan DASH stands for Dietary Approaches to  Stop Hypertension. The DASH eating plan is a healthy eating plan that has been shown to:  Reduce high blood pressure (hypertension).  Reduce your risk for type 2 diabetes, heart disease, and stroke.  Help with weight loss. What are tips for following this plan? Reading food labels  Check food labels for the amount of salt (sodium) per serving. Choose foods with less than 5 percent of the Daily Value of sodium. Generally, foods with less than 300 milligrams (mg) of sodium per serving fit into this eating plan.  To find whole grains, look for the word "whole" as the first word in the ingredient list. Shopping  Buy products labeled as "low-sodium" or "no salt added."  Buy fresh foods. Avoid canned foods and pre-made or frozen meals. Cooking  Avoid adding salt when cooking. Use salt-free seasonings or herbs instead of table salt or sea salt. Check with your health care provider or pharmacist before using salt substitutes.  Do not fry foods. Cook foods using healthy methods such as baking, boiling, grilling, roasting, and broiling instead.  Cook with heart-healthy oils, such as olive, canola, avocado, soybean, or sunflower oil. Meal planning  Eat a balanced diet that includes: ? 4 or more servings of fruits and 4 or more servings of vegetables each day. Try to fill one-half of your plate with fruits and vegetables. ? 6-8 servings of whole grains each day. ? Less than 6 oz (170 g) of lean meat, poultry, or fish each day. A 3-oz (85-g) serving of meat is about the same size as a deck of cards. One egg equals 1 oz (28 g). ? 2-3 servings of low-fat dairy each day. One serving is 1 cup (237 mL). ? 1 serving of nuts, seeds, or beans 5 times each week. ? 2-3 servings of heart-healthy fats. Healthy fats called omega-3 fatty acids are found in foods such as walnuts, flaxseeds, fortified milks, and eggs. These fats are also found in cold-water fish, such as sardines, salmon, and mackerel.  Limit  how much you eat of: ? Canned or prepackaged foods. ? Food that is high in trans fat, such as some fried foods. ? Food that is high in saturated fat, such as fatty meat. ? Desserts and other sweets, sugary drinks, and other foods with added sugar. ? Full-fat dairy products.  Do not salt foods before eating.  Do not eat more than 4 egg yolks a week.  Try to eat at least 2 vegetarian meals a week.  Eat more home-cooked food and less restaurant, buffet, and fast food.   Lifestyle  When eating at a restaurant, ask that your food be prepared with less salt or no salt, if possible.  If you drink alcohol: ? Limit how  much you use to:  0-1 drink a day for women who are not pregnant.  0-2 drinks a day for men. ? Be aware of how much alcohol is in your drink. In the [place] one drink equals one 12 oz bottle of beer (355 mL), one 5 oz glass of wine (148 mL), or one 1 oz glass of hard liquor (44 mL). General information  Avoid eating more than 2,300 mg of salt a day. If you have hypertension, you may need to reduce your sodium intake to 1,500 mg a day.  Work with your health care provider to maintain a healthy body weight or to lose weight. Ask what an ideal weight is for you.  Get at least 30 minutes of exercise that causes your heart to beat faster (aerobic exercise) most days of the week. Activities may include walking, swimming, or biking.  Work with your health care provider or dietitian to adjust your eating plan to your individual calorie needs. What foods should I eat? Fruits All fresh, dried, or frozen fruit. Canned fruit in natural juice (without added sugar). Vegetables Fresh or frozen vegetables (raw, steamed, roasted, or grilled). Low-sodium or reduced-sodium tomato and vegetable juice. Low-sodium or reduced-sodium tomato sauce and tomato paste. Low-sodium or reduced-sodium canned vegetables. Grains Whole-grain or whole-wheat bread. Whole-grain or whole-wheat pasta. Brown  rice. [place]. Bulgur. Whole-grain and low-sodium cereals. Pita bread. Low-fat, low-sodium crackers. Whole-wheat flour tortillas. Meats and other proteins Skinless chicken or [place]. Ground chicken or [place]. Pork with fat trimmed off. Fish and seafood. Egg whites. Dried beans, peas, or lentils. Unsalted nuts, nut butters, and seeds. Unsalted canned beans. Lean cuts of beef with fat trimmed off. Low-sodium, lean precooked or cured meat, such as sausages or meat loaves. Dairy Low-fat (1%) or fat-free (skim) milk. Reduced-fat, low-fat, or fat-free cheeses. Nonfat, low-sodium ricotta or cottage cheese. Low-fat or nonfat yogurt. Low-fat, low-sodium cheese. Fats and oils Soft margarine without trans fats. Vegetable oil. Reduced-fat, low-fat, or light mayonnaise and salad dressings (reduced-sodium). Canola, safflower, olive, avocado, soybean, and sunflower oils. Avocado. Seasonings and condiments Herbs. Spices. Seasoning mixes without salt. Other foods Unsalted popcorn and pretzels. Fat-free sweets. The items listed above may not be a complete list of foods and beverages you can eat. Contact a dietitian for more information. What foods should I avoid? Fruits Canned fruit in a light or heavy syrup. Fried fruit. Fruit in cream or butter sauce. Vegetables Creamed or fried vegetables. Vegetables in a cheese sauce. Regular canned vegetables (not low-sodium or reduced-sodium). Regular canned tomato sauce and paste (not low-sodium or reduced-sodium). Regular tomato and vegetable juice (not low-sodium or reduced-sodium). Angie Fava. Olives. Grains Baked goods made with fat, such as croissants, muffins, or some breads. Dry pasta or rice meal packs. Meats and other proteins Fatty cuts of meat. Ribs. Fried meat. Berniece Salines. Bologna, salami, and other precooked or cured meats, such as sausages or meat loaves. Fat from the back of a pig (fatback). Bratwurst. Salted nuts and seeds. Canned beans with added salt.  Canned or smoked fish. Whole eggs or egg yolks. Chicken or [place] with skin. Dairy Whole or 2% milk, cream, and half-and-half. Whole or full-fat cream cheese. Whole-fat or sweetened yogurt. Full-fat cheese. Nondairy creamers. Whipped toppings. Processed cheese and cheese spreads. Fats and oils Butter. Stick margarine. Lard. Shortening. Ghee. Bacon fat. Tropical oils, such as coconut, palm kernel, or palm oil. Seasonings and condiments Onion salt, garlic salt, seasoned salt, table salt, and sea salt. Worcestershire sauce. Tartar sauce. Barbecue  sauce. Teriyaki sauce. Soy sauce, including reduced-sodium. Steak sauce. Canned and packaged gravies. Fish sauce. Oyster sauce. Cocktail sauce. Store-bought horseradish. Ketchup. Mustard. Meat flavorings and tenderizers. Bouillon cubes. Hot sauces. Pre-made or packaged marinades. Pre-made or packaged taco seasonings. Relishes. Regular salad dressings. Other foods Salted popcorn and pretzels. The items listed above may not be a complete list of foods and beverages you should avoid. Contact a dietitian for more information. Where to find more information  National Heart, Lung, and Blood Institute: https://wilson-eaton.com/  American Heart Association: www.heart.org  Academy of Nutrition and Dietetics: www.eatright.[place]: www.kidney.org Summary  The DASH eating plan is a healthy eating plan that has been shown to reduce high blood pressure (hypertension). It may also reduce your risk for type 2 diabetes, heart disease, and stroke.  When on the DASH eating plan, aim to eat more fresh fruits and vegetables, whole grains, lean proteins, low-fat dairy, and heart-healthy fats.  With the DASH eating plan, you should limit salt (sodium) intake to 2,300 mg a day. If you have hypertension, you may need to reduce your sodium intake to 1,500 mg a day.  Work with your health care provider or dietitian to adjust your eating plan to your individual  calorie needs. This information is not intended to replace advice given to you by your health care provider. Make sure you discuss any questions you have with your health care provider. Document Revised: 02/19/2019 Document Reviewed: 02/19/2019 Elsevier Patient Education  2021 [employer].

## 2020-04-27 DIAGNOSIS — U071 COVID-19: Secondary | ICD-10-CM | POA: Diagnosis not present

## 2020-04-28 ENCOUNTER — Other Ambulatory Visit: Payer: Self-pay | Admitting: Internal Medicine

## 2020-04-28 MED ORDER — IRBESARTAN 300 MG PO TABS: 300.0000 mg | ORAL_TABLET | Freq: Every day | ORAL | 0 refills | Status: DC

## 2020-05-01 ENCOUNTER — Telehealth: Payer: Self-pay | Admitting: Pharmacist

## 2020-05-01 NOTE — Progress Notes (Signed)
° ° °  Chronic Care Management Pharmacy Assistant   Name: TYRIA SPRINGER  MRN: [id number] DOB: 1945/07/09  Reason for Encounter: Chart Review   PCP : Hoyt Koch, MD  Allergies:   Allergies  Allergen Reactions   Amlodipine     Gingival hyperplasia   Hctz [Hydrochlorothiazide]     States caused her to have gout    Medications: Outpatient Encounter Medications as of 05/01/2020  Medication Sig Note   Aspirin-Acetaminophen-Caffeine (EXCEDRIN PO) Take 1 tablet by mouth as needed (for headache).     atorvastatin (LIPITOR) 20 MG tablet TAKE 1 TABLET(20 MG) BY MOUTH DAILY    cholecalciferol (VITAMIN D3) 25 MCG (1000 UT) tablet Take 1,000 Units by mouth daily.    cloNIDine (CATAPRES - DOSED IN MG/24 HR) 0.3 mg/24hr patch Place 0.3 mg onto the skin once a week.  12/15/2015: Received from: External Pharmacy   cloNIDine (CATAPRES) 0.2 MG tablet Take 0.2 mg by mouth 2 (two) times daily.    fish oil-omega-3 fatty acids 1000 MG capsule Take 2 g by mouth daily.    irbesartan (AVAPRO) 300 MG tablet Take 1 tablet (300 mg total) by mouth daily.    latanoprost (XALATAN) 0.005 % ophthalmic solution Place 1 drop into both eyes at bedtime.     levothyroxine (SYNTHROID) 50 MCG tablet TAKE 1 TABLET(50 MCG) BY MOUTH DAILY BEFORE BREAKFAST    metFORMIN (GLUCOPHAGE) 500 MG tablet Take 1 tablet (500 mg total) by mouth 2 (two) times daily with a meal.    minoxidil (LONITEN) 2.5 MG tablet Take 7.5 mg by mouth 2 (two) times daily.    Misc Natural Products (TART CHERRY ADVANCED PO) Take by mouth.    Multiple Vitamin (MULTIVITAMIN) tablet Take 1 tablet by mouth daily.    nebivolol (BYSTOLIC) 5 MG tablet Take 5 mg by mouth daily. 04/13/2019: Unknown time.    spironolactone (ALDACTONE) 100 MG tablet Take 100 mg by mouth daily.    timolol (BETIMOL) 0.25 % ophthalmic solution 1-2 drops 2 (two) times daily.    No facility-administered encounter medications on file as of 05/01/2020.     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 Patient had change in medication to change Losartan to Irbesartan to help lower blood pressure. Also patient is to check blood pressure daily and document.  No gaps in adherence identified. Patient has follow up scheduled with pharmacy team. No further action required.   Wendy Poet, [place] [phone]

## 2020-05-02 ENCOUNTER — Telehealth: Payer: Self-pay | Admitting: Internal Medicine

## 2020-05-02 NOTE — Telephone Encounter (Signed)
Pt called looking to schedule recall colon at the hospital. Pt states that she is still on oxygen .

## 2020-05-03 ENCOUNTER — Other Ambulatory Visit: Payer: Self-pay

## 2020-05-03 DIAGNOSIS — Z86010 Personal history of colonic polyps: Secondary | ICD-10-CM

## 2020-05-03 NOTE — Telephone Encounter (Signed)
Pts covid test scheduled 06/02/20@9 :30am. Colon scheduled at [facility] 06/06/20 @11 :30am. Previsit scheduled for 05/29/20@ 1pm.

## 2020-05-04 NOTE — Telephone Encounter (Signed)
Pt scheduled for previsit 05/29/20@1pm , covid screen scheduled 06/02/20@9 :30am. Colon scheduled at [facility] 06/06/20@11 :30am, pt to arrive there at 10am. Pt aware of appts.

## 2020-05-11 NOTE — Telephone Encounter (Signed)
Inbound call from patient stating she will not have transportation for scheduled appointments and needs to be rescheduled please.

## 2020-05-11 NOTE — Telephone Encounter (Signed)
Pts covid test rescheduled to 3/25@9 :30am. Colon rescheduled to [facility] 06/27/20@11 :15am, pt to arrive there at 9:45am. Pt aware.

## 2020-05-17 ENCOUNTER — Ambulatory Visit (INDEPENDENT_AMBULATORY_CARE_PROVIDER_SITE_OTHER): Payer: Medicare Other | Admitting: Internal Medicine

## 2020-05-17 ENCOUNTER — Encounter: Payer: Self-pay | Admitting: Internal Medicine

## 2020-05-17 ENCOUNTER — Other Ambulatory Visit: Payer: Self-pay

## 2020-05-17 VITALS — BP 132/80 | HR 68 | Temp 98.7°F | Resp 18 | Ht 65.0 in | Wt 208.4 lb

## 2020-05-17 DIAGNOSIS — N1831 Chronic kidney disease, stage 3a: Secondary | ICD-10-CM

## 2020-05-17 DIAGNOSIS — I700 Atherosclerosis of aorta: Secondary | ICD-10-CM

## 2020-05-17 DIAGNOSIS — E785 Hyperlipidemia, unspecified: Secondary | ICD-10-CM | POA: Diagnosis not present

## 2020-05-17 DIAGNOSIS — E1169 Type 2 diabetes mellitus with other specified complication: Secondary | ICD-10-CM

## 2020-05-17 DIAGNOSIS — E1122 Type 2 diabetes mellitus with diabetic chronic kidney disease: Secondary | ICD-10-CM | POA: Diagnosis not present

## 2020-05-17 DIAGNOSIS — I129 Hypertensive chronic kidney disease with stage 1 through stage 4 chronic kidney disease, or unspecified chronic kidney disease: Secondary | ICD-10-CM

## 2020-05-17 LAB — LIPID PANEL
Cholesterol: 146 mg/dL (ref 0–200)
HDL: 38 mg/dL — ABNORMAL LOW (ref >39.00)
LDL Cholesterol: 87 mg/dL (ref 0–99)
NonHDL: 108.29
Total CHOL/HDL Ratio: 4
Triglycerides: 105 mg/dL (ref 0.0–149.0)
VLDL: 21 mg/dL (ref 0.0–40.0)

## 2020-05-17 LAB — COMPREHENSIVE METABOLIC PANEL
ALT: 11 U/L (ref 0–35)
AST: 15 U/L (ref 0–37)
Albumin: 4.1 g/dL (ref 3.5–5.2)
Alkaline Phosphatase: 69 U/L (ref 39–117)
BUN: 14 mg/dL (ref 6–23)
CO2: 28 mEq/L (ref 19–32)
Calcium: 10.2 mg/dL (ref 8.4–10.5)
Chloride: 106 mEq/L (ref 96–112)
Creatinine, Ser: 1 mg/dL (ref 0.40–1.20)
GFR: 55.51 mL/min — ABNORMAL LOW (ref >60.00)
Glucose, Bld: 140 mg/dL — ABNORMAL HIGH (ref 70–99)
Potassium: 4.4 mEq/L (ref 3.5–5.1)
Sodium: 141 mEq/L (ref 135–145)
Total Bilirubin: 0.6 mg/dL (ref 0.2–1.2)
Total Protein: 6.9 g/dL (ref 6.0–8.3)

## 2020-05-17 LAB — CBC
HCT: 39.2 % (ref 36.0–46.0)
Hemoglobin: 13.3 g/dL (ref 12.0–15.0)
MCHC: 34 g/dL (ref 30.0–36.0)
MCV: 88.9 fl (ref 78.0–100.0)
Platelets: 178 10*3/uL (ref 150.0–400.0)
RBC: 4.41 Mil/uL (ref 3.87–5.11)
RDW: 14.4 % (ref 11.5–15.5)
WBC: 6.1 10*3/uL (ref 4.0–10.5)

## 2020-05-17 LAB — HEMOGLOBIN A1C: Hgb A1c MFr Bld: 6.5 % (ref 4.6–6.5)

## 2020-05-17 MED ORDER — ATORVASTATIN CALCIUM 20 MG PO TABS: ORAL_TABLET | ORAL | 3 refills | Status: DC

## 2020-05-17 NOTE — Progress Notes (Signed)
   Subjective:   Patient ID: Jade Sullivan, female    DOB: [birth year], 75 y.o.   MRN: [id number]  HPI The patient is a 75 YO female coming in for follow up sugars (needs HgA1c, taking metformin and on statin and ARB, denies new numbness or weakness), and blood pressure (taking clonidine, recent change from losartan to irbesartan for better control also on bystolic and clonidine and spironolactone, BP mildly high at home to mid 140s/80s, denies chest pains or headaches), and cholesterol (taking lipitor 20 mg daily, denies chest pains or stroke symptoms, denies missing doses, calcium deposits on imaging studies).  Review of Systems  Constitutional: Negative.   HENT: Negative.   Eyes: Negative.   Respiratory: Negative for cough, chest tightness and shortness of breath.   Cardiovascular: Negative for chest pain, palpitations and leg swelling.  Gastrointestinal: Negative for abdominal distention, abdominal pain, constipation, diarrhea, nausea and vomiting.  Musculoskeletal: Negative.   Skin: Negative.   Neurological: Negative.   Psychiatric/Behavioral: Negative.     Objective:  Physical Exam Constitutional:      Appearance: She is well-developed and well-nourished.  HENT:     Head: Normocephalic and atraumatic.  Eyes:     Extraocular Movements: EOM normal.  Cardiovascular:     Rate and Rhythm: Normal rate and regular rhythm.  Pulmonary:     Effort: Pulmonary effort is normal. No respiratory distress.     Breath sounds: Normal breath sounds. No wheezing or rales.  Abdominal:     General: Bowel sounds are normal. There is no distension.     Palpations: Abdomen is soft.     Tenderness: There is no abdominal tenderness. There is no rebound.  Musculoskeletal:        General: No edema.     Cervical back: Normal range of motion.  Skin:    General: Skin is warm and dry.  Neurological:     Mental Status: She is alert and oriented to person, place, and time.     Coordination:  Coordination normal.  Psychiatric:        Mood and Affect: Mood and affect normal.     Vitals:   05/17/20 1030  BP: 132/80  Pulse: 68  Resp: 18  Temp: 98.7 F (37.1 C)  TempSrc: Oral  SpO2: 99%  Weight: 208 lb 6.4 oz (94.5 kg)  Height: 5\' 5"  (1.651 m)    This visit occurred during the SARS-CoV-2 public health emergency.  Safety protocols were in place, including screening questions prior to the visit, additional usage of staff PPE, and extensive cleaning of exam room while observing appropriate contact time as indicated for disinfecting solutions.   Assessment & Plan:

## 2020-05-17 NOTE — Patient Instructions (Signed)
We will check the labs today. 

## 2020-05-19 NOTE — Assessment & Plan Note (Signed)
Recent change to irbesartan from losartan about 1 week ago. Also taking spironolactone and clonidine and bystolic. Checking CMP and adjust as needed.

## 2020-05-19 NOTE — Assessment & Plan Note (Signed)
Checking lipid panel and adjust lipitor 20 mg daily.  

## 2020-05-19 NOTE — Assessment & Plan Note (Signed)
Taking statin and checking lipid panel and adjust as needed.

## 2020-05-19 NOTE — Assessment & Plan Note (Signed)
Checking HgA1c, on metformin. Taking ARB and statin. Eye exam up to date and foot exam up to date as well. Adjust as needed.

## 2020-05-22 DIAGNOSIS — U071 COVID-19: Secondary | ICD-10-CM | POA: Diagnosis not present

## 2020-05-28 DIAGNOSIS — U071 COVID-19: Secondary | ICD-10-CM | POA: Diagnosis not present

## 2020-05-29 ENCOUNTER — Other Ambulatory Visit: Payer: Self-pay

## 2020-05-29 ENCOUNTER — Ambulatory Visit (AMBULATORY_SURGERY_CENTER): Payer: Self-pay

## 2020-05-29 VITALS — Ht 65.0 in | Wt 216.0 lb

## 2020-05-29 DIAGNOSIS — Z860100 Personal history of colon polyps, unspecified: Secondary | ICD-10-CM

## 2020-05-29 MED ORDER — NA SULFATE-K SULFATE-MG SULF 17.5-3.13-1.6 GM/177ML PO SOLN
1.0000 | Freq: Once | ORAL | 0 refills | Status: AC
Start: 2020-05-29 — End: 2020-05-29

## 2020-05-29 NOTE — Progress Notes (Signed)
No egg or soy allergy known to patient  No issues with past sedation with any surgeries or procedures; No intubation problems in the past; No FH of Malignant Hyperthermia No diet pills per patient No home 02 use per patient  No blood thinners per patient  Pt denies issues with constipation  No A fib or A flutter  EMMI video via MyChart  COVID 19 guidelines implemented in PV today with Pt and RN  [place] screening scheduled on 06/23/2020 at 9:30 am; patient is aware of appt date/time; Pt denies loose teeth, dentures, partials, dental implants, capped or bonded teeth; Patient reports missing; Coupon given to pt in PV today, Code to Pharmacy and  NO PA's for preps discussed with pt in PV today  Discussed with pt there will be an out-of-pocket cost for prep and that varies from $0 to 70 dollars  Due to the COVID-19 pandemic we are asking patients to follow certain guidelines.  Pt aware of COVID protocols and [facility] guidelines

## 2020-06-02 ENCOUNTER — Other Ambulatory Visit (HOSPITAL_COMMUNITY): Payer: Medicare Other

## 2020-06-07 ENCOUNTER — Telehealth: Payer: Self-pay | Admitting: Pharmacist

## 2020-06-07 NOTE — Progress Notes (Signed)
Chronic Care Management Pharmacy Assistant   Name: Jade Sullivan  MRN: [id number] DOB: March 15, 1946   Reason for Encounter: Diabetic Adherence Call   Conditions to be addressed/monitored: DMII   Recent office visits:  05/17/20 Dr. [name]   Recent consult visits:  No  Hospital visits:  None in previous 6 months  Medications: Outpatient Encounter Medications as of 06/07/2020  Medication Sig Note  . Aspirin-Acetaminophen-Caffeine (EXCEDRIN PO) Take 1 tablet by mouth as needed (for headache).    Marland Kitchen atorvastatin (LIPITOR) 20 MG tablet TAKE 1 TABLET(20 MG) BY MOUTH DAILY   . cholecalciferol (VITAMIN D3) 25 MCG (1000 UT) tablet Take 1,000 Units by mouth daily.   . cloNIDine (CATAPRES - DOSED IN MG/24 HR) 0.3 mg/24hr patch Place 0.3 mg onto the skin once a week.  12/15/2015: Received from: External Pharmacy  . cloNIDine (CATAPRES) 0.2 MG tablet Take 0.2 mg by mouth 2 (two) times daily.   . fish oil-omega-3 fatty acids 1000 MG capsule Take 2 g by mouth daily.   . irbesartan (AVAPRO) 300 MG tablet Take 1 tablet (300 mg total) by mouth daily.   Marland Kitchen latanoprost (XALATAN) 0.005 % ophthalmic solution Place 1 drop into both eyes at bedtime.    Marland Kitchen levothyroxine (SYNTHROID) 50 MCG tablet TAKE 1 TABLET(50 MCG) BY MOUTH DAILY BEFORE BREAKFAST   . metFORMIN (GLUCOPHAGE) 500 MG tablet Take 1 tablet (500 mg total) by mouth 2 (two) times daily with a meal.   . minoxidil (LONITEN) 2.5 MG tablet Take 7.5 mg by mouth in the morning, at noon, and at bedtime.   . Multiple Vitamin (MULTIVITAMIN) tablet Take 1 tablet by mouth daily.   . nebivolol (BYSTOLIC) 5 MG tablet Take 5 mg by mouth daily. 04/13/2019: Unknown time.   Marland Kitchen spironolactone (ALDACTONE) 100 MG tablet Take 100 mg by mouth daily.   . timolol (BETIMOL) 0.25 % ophthalmic solution 1-2 drops 2 (two) times daily.    No facility-administered encounter medications on file as of 06/07/2020.     Star Rating Drugs: Irbesartan started 04/28/20 90  DS   Recent Relevant Labs: Lab Results  Component Value Date/Time   HGBA1C 6.5 05/17/2020 10:50 AM   HGBA1C 9.8 (H) 04/14/2019 04:07 AM    Kidney Function Lab Results  Component Value Date/Time   CREATININE 1.00 05/17/2020 10:50 AM   CREATININE 0.99 08/25/2019 11:26 AM   GFR 55.51 (L) 05/17/2020 10:50 AM   GFRNONAA 44 (L) 05/03/2019 12:55 PM   GFRAA 51 (L) 05/03/2019 12:55 PM    . Current antihyperglycemic regimen: The patient is on Metformin for diabetes  . What recent interventions/DTPs have been made to improve glycemic control: The patient believes she needs to see a doctor for her diabetes  . Have there been any recent hospitalizations or ED visits since last visit with CPP? The patient states that she has not been to the hospital or ED recently  . Patient denies hypoglycemic symptoms . Patient denies hyperglycemic symptoms  . How often are you checking your blood sugar? Patient states that she checks blood sugar maybe 2 or 3 times a day  . What are your blood sugars ranging? The patient states blood sugar ranges from 129-140 o Fasting: 129 o Before meals: NA o After meals: NA o Bedtime: NA  . During the week, how often does your blood glucose drop below 70? The patient states that she has not had any readings under 70  . Are you checking your feet daily/regularly? The  patient states that she checks her feet daily, she states she has 2 calluses under feet and would like to see a podiatrist if possible  Adherence Review: Is the patient currently on a STATIN medication? Yes, atorvastatin Is the patient currently on ACE/ARB medication? Yes, Irbesartan Does the patient have >5 day gap between last estimated fill dates? No   Wendy Poet, [place]   Time spent:20

## 2020-06-07 NOTE — Progress Notes (Signed)
Chronic Care Management Pharmacy Assistant   Name: FRIMY UFFELMAN  MRN: [id number] DOB: Aug 20, 1945   Reason for Encounter: Hypertension Adherence Call   Conditions to be addressed/monitored: HTN   Recent office visits:  05/17/20 Dr. [name], medication change discontinued Losartan; started Irbesartan   Recent consult visits:  None  Hospital visits:  None in previous 6 months  Medications: Outpatient Encounter Medications as of 06/07/2020  Medication Sig Note  . Aspirin-Acetaminophen-Caffeine (EXCEDRIN PO) Take 1 tablet by mouth as needed (for headache).    Marland Kitchen atorvastatin (LIPITOR) 20 MG tablet TAKE 1 TABLET(20 MG) BY MOUTH DAILY   . cholecalciferol (VITAMIN D3) 25 MCG (1000 UT) tablet Take 1,000 Units by mouth daily.   . cloNIDine (CATAPRES - DOSED IN MG/24 HR) 0.3 mg/24hr patch Place 0.3 mg onto the skin once a week.  12/15/2015: Received from: External Pharmacy  . cloNIDine (CATAPRES) 0.2 MG tablet Take 0.2 mg by mouth 2 (two) times daily.   . fish oil-omega-3 fatty acids 1000 MG capsule Take 2 g by mouth daily.   . irbesartan (AVAPRO) 300 MG tablet Take 1 tablet (300 mg total) by mouth daily.   Marland Kitchen latanoprost (XALATAN) 0.005 % ophthalmic solution Place 1 drop into both eyes at bedtime.    Marland Kitchen levothyroxine (SYNTHROID) 50 MCG tablet TAKE 1 TABLET(50 MCG) BY MOUTH DAILY BEFORE BREAKFAST   . metFORMIN (GLUCOPHAGE) 500 MG tablet Take 1 tablet (500 mg total) by mouth 2 (two) times daily with a meal.   . minoxidil (LONITEN) 2.5 MG tablet Take 7.5 mg by mouth in the morning, at noon, and at bedtime.   . Multiple Vitamin (MULTIVITAMIN) tablet Take 1 tablet by mouth daily.   . nebivolol (BYSTOLIC) 5 MG tablet Take 5 mg by mouth daily. 04/13/2019: Unknown time.   Marland Kitchen spironolactone (ALDACTONE) 100 MG tablet Take 100 mg by mouth daily.   . timolol (BETIMOL) 0.25 % ophthalmic solution 1-2 drops 2 (two) times daily.    No facility-administered encounter medications on file as of 06/07/2020.      Star Rating Drugs: Irbesartan started 04/28/20 90 DS   Reviewed chart prior to disease state call. Spoke with patient regarding BP  Recent Office Vitals: BP Readings from Last 3 Encounters:  05/17/20 132/80  03/09/20 140/86  02/10/20 (!) 142/80   Pulse Readings from Last 3 Encounters:  05/17/20 68  02/10/20 64  11/15/19 65    Wt Readings from Last 3 Encounters:  05/29/20 216 lb (98 kg)  05/17/20 208 lb 6.4 oz (94.5 kg)  03/09/20 213 lb (96.6 kg)     Kidney Function Lab Results  Component Value Date/Time   CREATININE 1.00 05/17/2020 10:50 AM   CREATININE 0.99 08/25/2019 11:26 AM   GFR 55.51 (L) 05/17/2020 10:50 AM   GFRNONAA 44 (L) 05/03/2019 12:55 PM   GFRAA 51 (L) 05/03/2019 12:55 PM    BMP Latest Ref Rng & Units 05/17/2020 08/25/2019 06/11/2019  Glucose 70 - 99 mg/dL 140(H) 139(H) 141(H)  BUN 6 - 23 mg/dL 14 16 13   Creatinine 0.40 - 1.20 mg/dL 1.00 0.99 1.01  Sodium 135 - 145 mEq/L 141 141 140  Potassium 3.5 - 5.1 mEq/L 4.4 3.9 4.2  Chloride 96 - 112 mEq/L 106 107 108  CO2 19 - 32 mEq/L 28 26 26   Calcium 8.4 - 10.5 mg/dL 10.2 10.0 10.2    . Current antihypertensive regimen: Patient is taking Clonidine patch, Irbesartan, Minoxidil, Bystolic, and Spironolactone  . How often are you  checking your Blood Pressure? The patient states that she check her blood pressure daily  . Current home BP readings: this morning it was 140/80  . What recent interventions/DTPs have been made by any provider to improve Blood Pressure control since last CPP Visit: The patient is to continue taking blood pressure meds to prevent heart attack or stroke  . Any recent hospitalizations or ED visits since last visit with CPP? The patient has not had any recent hospital or ED visits  . What diet changes have been made to improve Blood Pressure Control? The patient states that she watches her salt intake  . What exercise is being done to improve your Blood Pressure Control? The patient  states that because of Covid she does not go to the gym but rides a stationary bike at home   Adherence Review: Is the patient currently on ACE/ARB medication? Yes, Irbesartan Does the patient have >5 day gap between last estimated fill dates? No   Wendy Poet, [place]  Time spent:40

## 2020-06-19 DIAGNOSIS — U071 COVID-19: Secondary | ICD-10-CM | POA: Diagnosis not present

## 2020-06-22 ENCOUNTER — Encounter (HOSPITAL_COMMUNITY): Payer: Self-pay | Admitting: Internal Medicine

## 2020-06-22 ENCOUNTER — Other Ambulatory Visit: Payer: Self-pay

## 2020-06-23 ENCOUNTER — Other Ambulatory Visit (HOSPITAL_COMMUNITY)
Admission: RE | Admit: 2020-06-23 | Discharge: 2020-06-23 | Disposition: A | Discharge status: Home / Self Care | Payer: Medicare Other | Source: Ambulatory Visit | Attending: Internal Medicine | Admitting: Internal Medicine

## 2020-06-23 DIAGNOSIS — Z20822 Contact with and (suspected) exposure to covid-19: Secondary | ICD-10-CM | POA: Insufficient documentation

## 2020-06-23 DIAGNOSIS — Z01812 Encounter for preprocedural laboratory examination: Secondary | ICD-10-CM | POA: Insufficient documentation

## 2020-06-23 LAB — SARS CORONAVIRUS 2 (TAT 6-24 HRS): SARS Coronavirus 2: NEGATIVE

## 2020-06-25 DIAGNOSIS — U071 COVID-19: Secondary | ICD-10-CM | POA: Diagnosis not present

## 2020-06-27 ENCOUNTER — Ambulatory Visit (HOSPITAL_COMMUNITY): Payer: Medicare Other | Admitting: Anesthesiology

## 2020-06-27 ENCOUNTER — Encounter (HOSPITAL_COMMUNITY)
Admission: RE | Disposition: A | Discharge status: Home / Self Care | Payer: Self-pay | Source: Ambulatory Visit | Attending: Internal Medicine

## 2020-06-27 ENCOUNTER — Other Ambulatory Visit: Payer: Self-pay

## 2020-06-27 ENCOUNTER — Encounter (HOSPITAL_COMMUNITY): Payer: Self-pay | Admitting: Internal Medicine

## 2020-06-27 ENCOUNTER — Ambulatory Visit (HOSPITAL_COMMUNITY)
Admission: RE | Admit: 2020-06-27 | Discharge: 2020-06-27 | Disposition: A | Discharge status: Home / Self Care | Payer: Medicare Other | Source: Ambulatory Visit | Attending: Internal Medicine | Admitting: Internal Medicine

## 2020-06-27 DIAGNOSIS — E1136 Type 2 diabetes mellitus with diabetic cataract: Secondary | ICD-10-CM | POA: Diagnosis not present

## 2020-06-27 DIAGNOSIS — Z1211 Encounter for screening for malignant neoplasm of colon: Secondary | ICD-10-CM | POA: Insufficient documentation

## 2020-06-27 DIAGNOSIS — N189 Chronic kidney disease, unspecified: Secondary | ICD-10-CM | POA: Insufficient documentation

## 2020-06-27 DIAGNOSIS — Z833 Family history of diabetes mellitus: Secondary | ICD-10-CM | POA: Insufficient documentation

## 2020-06-27 DIAGNOSIS — Z87891 Personal history of nicotine dependence: Secondary | ICD-10-CM | POA: Diagnosis not present

## 2020-06-27 DIAGNOSIS — I129 Hypertensive chronic kidney disease with stage 1 through stage 4 chronic kidney disease, or unspecified chronic kidney disease: Secondary | ICD-10-CM | POA: Diagnosis not present

## 2020-06-27 DIAGNOSIS — D125 Benign neoplasm of sigmoid colon: Secondary | ICD-10-CM | POA: Diagnosis not present

## 2020-06-27 DIAGNOSIS — Z8249 Family history of ischemic heart disease and other diseases of the circulatory system: Secondary | ICD-10-CM | POA: Diagnosis not present

## 2020-06-27 DIAGNOSIS — D123 Benign neoplasm of transverse colon: Secondary | ICD-10-CM

## 2020-06-27 DIAGNOSIS — Z8616 Personal history of COVID-19: Secondary | ICD-10-CM | POA: Insufficient documentation

## 2020-06-27 DIAGNOSIS — K5730 Diverticulosis of large intestine without perforation or abscess without bleeding: Secondary | ICD-10-CM | POA: Diagnosis not present

## 2020-06-27 DIAGNOSIS — Z860100 Personal history of colon polyps, unspecified: Secondary | ICD-10-CM

## 2020-06-27 DIAGNOSIS — E785 Hyperlipidemia, unspecified: Secondary | ICD-10-CM | POA: Insufficient documentation

## 2020-06-27 DIAGNOSIS — Z888 Allergy status to other drugs, medicaments and biological substances status: Secondary | ICD-10-CM | POA: Diagnosis not present

## 2020-06-27 DIAGNOSIS — E1122 Type 2 diabetes mellitus with diabetic chronic kidney disease: Secondary | ICD-10-CM | POA: Insufficient documentation

## 2020-06-27 DIAGNOSIS — K635 Polyp of colon: Secondary | ICD-10-CM | POA: Diagnosis not present

## 2020-06-27 DIAGNOSIS — E890 Postprocedural hypothyroidism: Secondary | ICD-10-CM | POA: Insufficient documentation

## 2020-06-27 DIAGNOSIS — K648 Other hemorrhoids: Secondary | ICD-10-CM | POA: Diagnosis not present

## 2020-06-27 DIAGNOSIS — Z8371 Family history of colonic polyps: Secondary | ICD-10-CM | POA: Diagnosis not present

## 2020-06-27 HISTORY — PX: BIOPSY: SHX5522

## 2020-06-27 HISTORY — DX: Other specified health status: Z78.9

## 2020-06-27 HISTORY — PX: POLYPECTOMY: SHX5525

## 2020-06-27 HISTORY — DX: Dependence on supplemental oxygen: Z99.81

## 2020-06-27 HISTORY — PX: COLONOSCOPY WITH PROPOFOL: SHX5780

## 2020-06-27 LAB — GLUCOSE, CAPILLARY: Glucose-Capillary: 140 mg/dL — ABNORMAL HIGH (ref 70–99)

## 2020-06-27 SURGERY — COLONOSCOPY WITH PROPOFOL
Anesthesia: Monitor Anesthesia Care

## 2020-06-27 MED ORDER — PROPOFOL 10 MG/ML IV BOLUS
INTRAVENOUS | Status: DC | PRN
  Administered 2020-06-27: 40 mg via INTRAVENOUS

## 2020-06-27 MED ORDER — PROPOFOL 500 MG/50ML IV EMUL
INTRAVENOUS | Status: AC
  Filled 2020-06-27: qty 50

## 2020-06-27 MED ORDER — PROPOFOL 500 MG/50ML IV EMUL
INTRAVENOUS | Status: DC | PRN
  Administered 2020-06-27: 125 ug/kg/min via INTRAVENOUS

## 2020-06-27 MED ORDER — SODIUM CHLORIDE 0.9 % IV SOLN: INTRAVENOUS | Status: DC

## 2020-06-27 MED ORDER — LIDOCAINE 2% (20 MG/ML) 5 ML SYRINGE
INTRAMUSCULAR | Status: DC | PRN
  Administered 2020-06-27: 50 mg via INTRAVENOUS

## 2020-06-27 MED ORDER — LACTATED RINGERS IV SOLN
INTRAVENOUS | Status: DC
  Administered 2020-06-27: 1000 mL via INTRAVENOUS

## 2020-06-27 SURGICAL SUPPLY — 21 items

## 2020-06-27 NOTE — Discharge Instructions (Signed)

## 2020-06-27 NOTE — Anesthesia Preprocedure Evaluation (Signed)
Anesthesia Evaluation    Reviewed: Allergy & Precautions, Patient's Chart, lab work & pertinent test results  Airway Mallampati: III  TM Distance: >3 FB Neck ROM: Full    Dental  (+) Missing   Pulmonary neg pulmonary ROS, former smoker,    Pulmonary exam normal breath sounds clear to auscultation       Cardiovascular hypertension, Pt. on medications and Pt. on home beta blockers Normal cardiovascular exam Rhythm:Regular Rate:Normal     Neuro/Psych negative neurological ROS     GI/Hepatic negative GI ROS, Neg liver ROS,   Endo/Other  diabetes, Oral Hypoglycemic AgentsHypothyroidism   Renal/GU negative Renal ROS     Musculoskeletal  (+) Arthritis ,   Abdominal (+) + obese,   Peds  Hematology HLD   Anesthesia Other Findings History of colon polyps  Reproductive/Obstetrics                             Anesthesia Physical Anesthesia Plan  ASA: III  Anesthesia Plan: MAC   Post-op Pain Management:    Induction: Intravenous  PONV Risk Score and Plan: 2 and Propofol infusion and Treatment may vary due to age or medical condition  Airway Management Planned: Simple Face Mask  Additional Equipment:   Intra-op Plan:   Post-operative Plan:   Informed Consent: I have reviewed the patients History and Physical, chart, labs and discussed the procedure including the risks, benefits and alternatives for the proposed anesthesia with the patient or authorized representative who has indicated his/her understanding and acceptance.     Dental advisory given  Plan Discussed with: CRNA  Anesthesia Plan Comments:         Anesthesia Quick Evaluation

## 2020-06-27 NOTE — Op Note (Signed)
[facility] Patient Name: Jade Sullivan Procedure Date: 06/27/2020 MRN: [id number] Attending MD: Jerene Bears , MD Date of Birth: 15-Feb-1946 CSN: [id number] Age: 75 Admit Type: Outpatient Procedure:                Colonoscopy Indications:              High risk colon cancer surveillance: Personal                            history of non-advanced adenoma, Family history of                            adenomas of the colon in multiple first-degree                            relatives, Last colonoscopy: December 2016 Providers:                Lajuan Lines. Hilarie Fredrickson, MD, Burtis Junes, RN, Elspeth Cho                            Tech., Technician, Dellie Catholic Referring MD:             Real Cons. [name], MD Medicines:                Monitored Anesthesia Care Complications:            No immediate complications. Estimated Blood Loss:     Estimated blood loss was minimal. Procedure:                Pre-Anesthesia Assessment:                           - Prior to the procedure, a History and Physical                            was performed, and patient medications and                            allergies were reviewed. The patient's tolerance of                            previous anesthesia was also reviewed. The risks                            and benefits of the procedure and the sedation                            options and risks were discussed with the patient.                            All questions were answered, and informed consent                            was obtained. Prior Anticoagulants: The patient has  taken no previous anticoagulant or antiplatelet                            agents. ASA Grade Assessment: III - A patient with                            severe systemic disease. After reviewing the risks                            and benefits, the patient was deemed in                            satisfactory condition to undergo the  procedure.                           After obtaining informed consent, the colonoscope                            was passed under direct vision. Throughout the                            procedure, the patient's blood pressure, pulse, and                            oxygen saturations were monitored continuously. The                            PCF-H190DL ([id number]) Olympus pediatric colonoscope                            was introduced through the anus and advanced to the                            cecum, identified by appendiceal orifice and                            ileocecal valve. The colonoscopy was performed                            without difficulty. The patient tolerated the                            procedure well. The quality of the bowel                            preparation was good. The ileocecal valve,                            appendiceal orifice, and rectum were photographed. Scope In: 11:16:49 AM Scope Out: 11:38:37 AM Scope Withdrawal Time: 0 hours 16 minutes 16 seconds  Total Procedure Duration: 0 hours 21 minutes 48 seconds  Findings:      The digital rectal exam was normal.      A 2 mm polyp was found in the transverse colon. The  polyp was sessile.       The polyp was removed with a cold biopsy forceps. Resection and       retrieval were complete.      A 6 mm polyp was found in the sigmoid colon. The polyp was sessile. The       polyp was removed with a cold snare. Resection and retrieval were       complete.      Many small and large-mouthed diverticula were found from ascending colon       to sigmoid colon.      Internal hemorrhoids were found during retroflexion. Impression:               - One 2 mm polyp in the transverse colon, removed                            with a cold biopsy forceps. Resected and retrieved.                           - One 6 mm polyp in the sigmoid colon, removed with                            a cold snare. Resected and  retrieved.                           - Diverticulosis from ascending colon to sigmoid                            colon.                           - Internal hemorrhoids. Moderate Sedation:      Not Applicable - Patient had care per Anesthesia. Recommendation:           - Patient has a contact number available for                            emergencies. The signs and symptoms of potential                            delayed complications were discussed with the                            patient. Return to normal activities tomorrow.                            Written discharge instructions were provided to the                            patient.                           - Resume previous diet.                           - Continue present medications.                           -  Await pathology results.                           - Repeat colonoscopy is recommended for                            surveillance. The colonoscopy date will be                            determined after pathology results from today's                            exam become available for review. Procedure Code(s):        --- Professional ---                           [phone], Colonoscopy, flexible; with removal of                            tumor(s), polyp(s), or other lesion(s) by snare                            technique                           45380, 68, Colonoscopy, flexible; with biopsy,                            single or multiple Diagnosis Code(s):        --- Professional ---                           Z86.010, Personal history of colonic polyps                           K63.5, Polyp of colon                           K64.8, Other hemorrhoids                           Z83.71, Family history of colonic polyps                           K57.30, Diverticulosis of large intestine without                            perforation or abscess without bleeding CPT copyright 2019 American [employer] Association. All  rights reserved. The codes documented in this report are preliminary and upon coder review may  be revised to meet current compliance requirements. Jerene Bears, MD 06/27/2020 11:42:23 AM This report has been signed electronically. Number of Addenda: 0

## 2020-06-27 NOTE — Transfer of Care (Addendum)
Immediate Anesthesia Transfer of Care Note  Patient: Jade Sullivan  Procedure(s) Performed: COLONOSCOPY WITH PROPOFOL (N/A ) BIOPSY POLYPECTOMY  Patient Location: Endoscopy Unit  Anesthesia Type:mac  Level of Consciousness: drowsy  Airway & Oxygen Therapy: Patient Spontanous Breathing and Patient connected to face mask  Post-op Assessment: Report given to RN and Post -op Vital signs reviewed and stable  Post vital signs: Reviewed and stable  Last Vitals:  Vitals Value Taken Time  BP    Temp    Pulse 64 06/27/20 1146  Resp 11 06/27/20 1146  SpO2 100 % 06/27/20 1146  Vitals shown include unvalidated device data.  Last Pain:  Vitals:   06/27/20 1027  TempSrc: Oral  PainSc: 0-No pain         Complications: No complications documented.

## 2020-06-27 NOTE — H&P (Signed)
HPI: Jade Sullivan is a 75 year old female with a history of adenomatous polyps, family history of colon polyps and multiple first degree relatives, colonic diverticulosis, CKD, hypertension, hyperlipidemia and diabetes who presents for outpatient surveillance colonoscopy.  Last in the office in August 2021.  Her last colonoscopy was in December 2021.  No new abdominal complaints.  Tolerated the prep well.  Past Medical History:  Diagnosis Date  . Allergy    seasonal allergies  . Arthritis    bilateral knees  . Cataract    not a surgical candidate at this time (05/29/2020)  . Chronic kidney disease   . COVID-19 04/2019  . Diabetes mellitus without complication ([place])    on meds  . Diverticulosis of colon (without mention of hemorrhage)   . Dysmetabolic syndrome X   . Glaucoma    on meds  . Gout   . H/O hyperthyroidism    By patient h/o hyperthyroidism with protosis, s/p subtotal thyroidectomy. On no replacement therapy. Last thyroid functions 2013 in [place]  Plan Will need thyroid functions Feb '15 with recommendations to follow.    . Heart murmur    hx of  . Hyperlipidemia    on meds  . Hypertension    on multiple meds  . Hypothyroidism    on meds  . Kidney lesion   . On supplemental oxygen by nasal cannula    at bedtime  . Pancreatic cyst   . Tubular adenoma of colon     Past Surgical History:  Procedure Laterality Date  . [place]  . COLONOSCOPY  2016   JMP-MAC-2 day suprep (good)-TICS/TA  . EYE SURGERY    . LIPOMA EXCISION Right 11/24/2014   Procedure: EXCISION RIGHT SHOULDER LIPOMA;  Surgeon: Erroll Luna, MD;  Location: [place];  Service: General;  Laterality: Right;  . POLYPECTOMY  2016   TA  . THYROIDECTOMY, PARTIAL  1981  . TONSILLECTOMY    . UMBILICAL HERNIA REPAIR  2000  . WISDOM TOOTH EXTRACTION      (Not in an outpatient encounter)   Allergies  Allergen Reactions  . Amlodipine     Gingival hyperplasia  .  Hctz [Hydrochlorothiazide] Other (See Comments)    gout    Family History  Problem Relation Age of Onset  . Diabetes Mother        DM2  . Hypertension Mother   . Pneumonia Mother        died from this condition  . Colon polyps Mother   . Heart disease Father        died from this condition  . Colon polyps Father   . Kidney disease Sister        HBP and DM2; relative died from this condition  . Colon polyps Sister   . Heart disease Brother        MI - HBP and DM2  . Colon polyps Brother   . Colon polyps Brother   . Colon cancer Neg Hx   . Esophageal cancer Neg Hx   . Rectal cancer Neg Hx   . Stomach cancer Neg Hx     Social History   Tobacco Use  . Smoking status: Former Research scientist (life sciences)  . Smokeless tobacco: Never Used  Vaping Use  . Vaping Use: Never used  Substance Use Topics  . Alcohol use: No    Alcohol/week: 0.0 standard drinks  . Drug use: No    ROS: As per history of present illness, otherwise  negative  BP (!) 162/89   Temp 98.4 F (36.9 C) (Oral)   Resp 17   Ht _0  (1.651 m)   Wt 93.9 kg   SpO2 97%   BMI 34.45 kg/m  Gen: awake, alert, NAD HEENT: anicteric, op clear CV: RRR, no mrg Pulm: CTA b/l Abd: soft, NT/ND, +BS throughout Ext: no c/c/e Neuro: nonfocal  RELEVANT LABS AND IMAGING: CBC    Component Value Date/Time   WBC 6.1 05/17/2020 1050   RBC 4.41 05/17/2020 1050   HGB 13.3 05/17/2020 1050   HCT 39.2 05/17/2020 1050   PLT 178.0 05/17/2020 1050   MCV 88.9 05/17/2020 1050   MCH 30.8 05/03/2019 1255   MCHC 34.0 05/17/2020 1050   RDW 14.4 05/17/2020 1050   LYMPHSABS 1.5 08/25/2019 1126   MONOABS 0.3 08/25/2019 1126   EOSABS 0.2 08/25/2019 1126   BASOSABS 0.0 08/25/2019 1126    CMP     Component Value Date/Time   NA 141 05/17/2020 1050   NA 139 11/11/2017 0000   K 4.4 05/17/2020 1050   CL 106 05/17/2020 1050   CO2 28 05/17/2020 1050   GLUCOSE 140 (H) 05/17/2020 1050   BUN 14 05/17/2020 1050   BUN 15 11/11/2017 0000    CREATININE 1.00 05/17/2020 1050   CALCIUM 10.2 05/17/2020 1050   PROT 6.9 05/17/2020 1050   ALBUMIN 4.1 05/17/2020 1050   AST 15 05/17/2020 1050   ALT 11 05/17/2020 1050   ALKPHOS 69 05/17/2020 1050   BILITOT 0.6 05/17/2020 1050   GFRNONAA 44 (L) 05/03/2019 1255   GFRAA 51 (L) 05/03/2019 1255    ASSESSMENT/PLAN: 75 year old female with a history of adenomatous polyps, family history of colon polyps and multiple first degree relatives, colonic diverticulosis, CKD, hypertension, hyperlipidemia and diabetes who presents for outpatient surveillance colonoscopy.  1.  Personal history of adenomatous colon polyp/family history of colon polyps in father, mother, siblings and son --- surveillance colonoscopy today with monitored anesthesia care. The nature of the procedure, as well as the risks, benefits, and alternatives were carefully and thoroughly reviewed with the patient. Ample time for discussion and questions allowed. The patient understood, was satisfied, and agreed to proceed.

## 2020-06-28 ENCOUNTER — Encounter (HOSPITAL_COMMUNITY): Payer: Self-pay | Admitting: Internal Medicine

## 2020-06-28 LAB — SURGICAL PATHOLOGY

## 2020-06-28 NOTE — Anesthesia Postprocedure Evaluation (Signed)
Anesthesia Post Note  Patient: Jade Sullivan  Procedure(s) Performed: COLONOSCOPY WITH PROPOFOL (N/A ) BIOPSY POLYPECTOMY     Patient location during evaluation: Endoscopy Anesthesia Type: MAC Level of consciousness: awake Pain management: pain level controlled Vital Signs Assessment: post-procedure vital signs reviewed and stable Respiratory status: spontaneous breathing, nonlabored ventilation, respiratory function stable and patient connected to nasal cannula oxygen Cardiovascular status: stable and blood pressure returned to baseline Postop Assessment: no apparent nausea or vomiting Anesthetic complications: no   No complications documented.  Last Vitals:  Vitals:   06/27/20 1151 06/27/20 1200  BP: (!) 108/34 126/64  Pulse: 71 62  Resp: 16 17  Temp:    SpO2: 99% 96%    Last Pain:  Vitals:   06/27/20 1200  TempSrc:   PainSc: 0-No pain                 Cregg Jutte P Roger Fasnacht

## 2020-06-29 ENCOUNTER — Encounter: Payer: Self-pay | Admitting: Internal Medicine

## 2020-07-20 DIAGNOSIS — U071 COVID-19: Secondary | ICD-10-CM | POA: Diagnosis not present

## 2020-07-22 ENCOUNTER — Other Ambulatory Visit: Payer: Self-pay | Admitting: Internal Medicine

## 2020-07-24 ENCOUNTER — Other Ambulatory Visit: Payer: Self-pay

## 2020-07-24 ENCOUNTER — Ambulatory Visit (INDEPENDENT_AMBULATORY_CARE_PROVIDER_SITE_OTHER): Payer: Medicare Other | Admitting: Pharmacist

## 2020-07-24 DIAGNOSIS — I129 Hypertensive chronic kidney disease with stage 1 through stage 4 chronic kidney disease, or unspecified chronic kidney disease: Secondary | ICD-10-CM

## 2020-07-24 DIAGNOSIS — E1122 Type 2 diabetes mellitus with diabetic chronic kidney disease: Secondary | ICD-10-CM

## 2020-07-24 DIAGNOSIS — E785 Hyperlipidemia, unspecified: Secondary | ICD-10-CM | POA: Diagnosis not present

## 2020-07-24 DIAGNOSIS — E1169 Type 2 diabetes mellitus with other specified complication: Secondary | ICD-10-CM

## 2020-07-24 DIAGNOSIS — N1831 Chronic kidney disease, stage 3a: Secondary | ICD-10-CM | POA: Diagnosis not present

## 2020-07-24 DIAGNOSIS — I700 Atherosclerosis of aorta: Secondary | ICD-10-CM

## 2020-07-24 NOTE — Progress Notes (Signed)
Chronic Care Management Pharmacy Note  07/24/2020 Name:  Jade Sullivan MRN:  [id number] DOB:  March 18, 1946  Subjective: Jade Sullivan is an 75 y.o. year old female who is a primary patient of Hoyt Koch, MD.  The CCM team was consulted for assistance with disease management and care coordination needs.    Engaged with patient by telephone for follow up visit in response to provider referral for pharmacy case management and/or care coordination services.   Consent to Services:  The patient was given information about Chronic Care Management services, agreed to services, and gave verbal consent prior to initiation of services.  Please see initial visit note for detailed documentation.   Patient Care Team: Hoyt Koch, MD as PCP - General (Internal Medicine) Princess Bruins, MD as Consulting Physician (Obstetrics and Gynecology) Hilarie Fredrickson, Lajuan Lines, MD as Consulting Physician (Gastroenterology) Corliss Parish, MD as Consulting Physician (Nephrology) [facility] (Ophthalmology) Charlton Haws, [facility] as Pharmacist (Pharmacist)  Recent office visits: 05/17/20 Dr [name]: chronic f/u; recent change form losartan to irbesartan - somewhat improved BP. Labs stable, no changes.  02/10/20 Dr [name] OV: DM f/u. A1c was 5.7 mid Oct from home health.  11/09/19 Dr [name] OV: chronic f/u. No longer requiring O2 on exertion, covid as resolved.  Recent consult visits: 06/27/20 colonoscopy   03/09/20 Dr Dellis Filbert (OB/GYN): breast and pelvic exam normal.  12/27/19 Dr Moshe Cipro (nephrology): f/u for HTN CKD   11/15/19 Dr Hilarie Fredrickson (GI): f/u ? Cirrhosis - lab work normal, MRI w/o evidence of cirrhosis. Pancreatic and renal cysts seen on MRI, f/u 6 mos.  Hospital visits: None in previous 6 months  Objective:  Lab Results  Component Value Date   CREATININE 1.00 05/17/2020   BUN 14 05/17/2020   GFR 55.51 (L) 05/17/2020   GFRNONAA 44 (L) 05/03/2019    GFRAA 51 (L) 05/03/2019   NA 141 05/17/2020   K 4.4 05/17/2020   CALCIUM 10.2 05/17/2020   CO2 28 05/17/2020   GLUCOSE 140 (H) 05/17/2020    Lab Results  Component Value Date/Time   HGBA1C 6.5 05/17/2020 10:50 AM   HGBA1C 9.8 (H) 04/14/2019 04:07 AM   FRUCTOSAMINE 283 06/11/2019 10:12 AM   GFR 55.51 (L) 05/17/2020 10:50 AM   GFR 66.39 08/25/2019 11:26 AM    Last diabetic Eye exam: No results found for: HMDIABEYEEXA  Last diabetic Foot exam: No results found for: HMDIABFOOTEX   Lab Results  Component Value Date   CHOL 146 05/17/2020   HDL 38.00 (L) 05/17/2020   LDLCALC 87 05/17/2020   TRIG 105.0 05/17/2020   CHOLHDL 4 05/17/2020    Hepatic Function Latest Ref Rng & Units 05/17/2020 08/25/2019 06/11/2019  Total Protein 6.0 - 8.3 g/dL 6.9 6.7 6.8  Albumin 3.5 - 5.2 g/dL 4.1 4.1 3.8  AST 0 - 37 U/L '15 14 16  ' ALT 0 - 35 U/L '11 9 9  ' Alk Phosphatase 39 - 117 U/L 69 65 65  Total Bilirubin 0.2 - 1.2 mg/dL 0.6 0.6 0.8  Bilirubin, Direct 0.0 - 0.3 mg/dL - - -    Lab Results  Component Value Date/Time   TSH 4.23 06/11/2019 10:12 AM   TSH 6.55 (H) 05/12/2018 11:34 AM   FREET4 0.78 06/11/2019 10:12 AM   FREET4 0.80 05/12/2018 11:34 AM    CBC Latest Ref Rng & Units 05/17/2020 08/25/2019 06/11/2019  WBC 4.0 - 10.5 K/uL 6.1 4.5 5.1  Hemoglobin 12.0 - 15.0 g/dL 13.3 12.6 12.8  Hematocrit  36.0 - 46.0 % 39.2 37.3 38.0  Platelets 150.0 - 400.0 K/uL 178.0 168.0 221.0    Lab Results  Component Value Date/Time   VD25OH 41.4 11/11/2017 12:00 AM   VD25OH 37.99 03/17/2017 11:30 AM    Clinical ASCVD: No  The 10-year ASCVD risk score Mikey Bussing DC Jr., et al., 2013) is: 21.5%   Values used to calculate the score:     Age: 50 years     Sex: Female     Is Non-Hispanic African American: Yes     Diabetic: Yes     Tobacco smoker: No     Systolic Blood Pressure: 865 mmHg     Is BP treated: Yes     HDL Cholesterol: 38 mg/dL     Total Cholesterol: 146 mg/dL    Depression screen [facility] 2/9  06/25/2019 06/11/2019 05/25/2019  Decreased Interest 0 0 0  Down, Depressed, Hopeless 0 0 0  PHQ - 2 Score 0 0 0     Social History   Tobacco Use  Smoking Status Former Smoker  Smokeless Tobacco Never Used   BP Readings from Last 3 Encounters:  06/27/20 126/64  05/17/20 132/80  03/09/20 140/86   Pulse Readings from Last 3 Encounters:  06/27/20 62  05/17/20 68  02/10/20 64   Wt Readings from Last 3 Encounters:  06/27/20 207 lb (93.9 kg)  05/29/20 216 lb (98 kg)  05/17/20 208 lb 6.4 oz (94.5 kg)   BMI Readings from Last 3 Encounters:  06/27/20 34.45 kg/m  05/29/20 35.94 kg/m  05/17/20 34.68 kg/m    Assessment/Interventions: Review of patient past medical history, allergies, medications, health status, including review of consultants reports, laboratory and other test data, was performed as part of comprehensive evaluation and provision of chronic care management services.   SDOH:  (Social Determinants of Health) assessments and interventions performed: Yes  SDOH Screenings   Alcohol Screen: Not on file  Depression (HQI6-9): Not on file  Financial Resource Strain: Low Risk   . Difficulty of Paying Living Expenses: Not hard at all  Food Insecurity: Not on file  Housing: Not on file  Physical Activity: Not on file  Social Connections: Not on file  Stress: Not on file  Tobacco Use: Medium Risk  . Smoking Tobacco Use: Former Smoker  . Smokeless Tobacco Use: Never Used  Transportation Needs: Not on file    CCM Care Plan  Allergies  Allergen Reactions  . Amlodipine     Gingival hyperplasia  . Hctz [Hydrochlorothiazide] Other (See Comments)    gout    Medications Reviewed Today    Reviewed by Charlton Haws, [facility] (Pharmacist) on 07/24/20 at 1327  Med List Status: <None>  Medication Order Taking? Sig Documenting Provider Last Dose Status Informant  aspirin EC 81 MG tablet [id number] Yes Take 81 mg by mouth every evening. Swallow whole. [provider] Taking Active Self  aspirin-acetaminophen-caffeine (EXCEDRIN MIGRAINE) [phone] MG tablet [id number] Yes Take 1-2 tablets by mouth every 6 (six) hours as needed for headache (sinus headaches/pain). [provider] Taking Active Self  atorvastatin (LIPITOR) 20 MG tablet [id number] Yes TAKE 1 TABLET(20 MG) BY MOUTH DAILY  Patient taking differently: Take 20 mg by mouth at bedtime.   Hoyt Koch, MD Taking Active   cholecalciferol (VITAMIN D3) 25 MCG (1000 UT) tablet [id number] Yes Take 1,000 Units by mouth daily. [provider] Taking Active Self  cloNIDine (CATAPRES - DOSED IN MG/24 HR) 0.3 mg/24hr patch [id number] Yes Place  0.3 mg onto the skin every Wednesday. [provider] Taking Active Self           Med Note Vinie Sill Jun 13, 2020 12:40 PM)    cloNIDine (CATAPRES) 0.2 MG tablet [id number] Yes Take 0.2 mg by mouth 3 (three) times daily. [provider] Taking Active Self  fish oil-omega-3 fatty acids 1000 MG capsule [id number] Yes Take 2 g by mouth daily. [provider] Taking Active Self  irbesartan (AVAPRO) 300 MG tablet [id number] Yes Take 1 tablet (300 mg total) by mouth in the morning. Hoyt Koch, MD Taking Active   latanoprost (XALATAN) 0.005 % ophthalmic solution [id number] Yes Place 1 drop into both eyes at bedtime.  [provider] Taking Active Self  levothyroxine (SYNTHROID) 50 MCG tablet [id number] Yes TAKE 1 TABLET(50 MCG) BY MOUTH DAILY BEFORE BREAKFAST  Patient taking differently: Take 50 mcg by mouth daily before breakfast.   Hoyt Koch, MD Taking Active   metFORMIN (GLUCOPHAGE) 500 MG tablet [id number] Yes Take 1 tablet (500 mg total) by mouth 2 (two) times daily with a meal. Hoyt Koch, MD Taking Active Self  minoxidil (LONITEN) 2.5 MG tablet [id number] Yes Take 5 mg by mouth in the morning, at noon, and at bedtime. [provider] Taking Active Self  Multiple  Vitamin (MULTIVITAMIN WITH MINERALS) TABS tablet [id number] Yes Take 1 tablet by mouth every evening. [provider] Taking Active Self  nebivolol (BYSTOLIC) 5 MG tablet [id number] Yes Take 5 mg by mouth in the morning. [provider] Taking Active Self           Med Note Vinie Sill Jun 13, 2020 12:40 PM)    Omega-3 Fatty Acids (FISH OIL) 1000 MG CAPS [id number] Yes Take 1,000 mg by mouth at bedtime. [provider] Taking Active Self  spironolactone (ALDACTONE) 100 MG tablet [id number] Yes Take 100 mg by mouth every evening. [provider] Taking Active Self  timolol (TIMOPTIC) 0.5 % ophthalmic solution [id number] Yes Place 1 drop into both eyes 2 (two) times daily. [provider] Taking Active Self  vitamin B-12 (CYANOCOBALAMIN) 500 MCG tablet [id number] Yes Take 1,000 mcg by mouth every evening. [provider] Taking Active Self          Patient Active Problem List   Diagnosis Date Noted  . History of colonic polyps   . Benign neoplasm of sigmoid colon   . Benign neoplasm of transverse colon   . Aortic atherosclerosis ([place]) 02/10/2020  . Acquired cyst of kidney 11/09/2019  . Nocturnal hypoxia 11/09/2019  . Liver problem 06/11/2019  . Pericardial effusion 05/06/2019  . Routine general medical examination at a health care facility 03/17/2017  . History of acute gouty arthritis 11/03/2014  . Hypothyroidism 09/28/2014  . Lipoma of shoulder 09/27/2014  . H/O hyperthyroidism 02/17/2013  . Hyperlipidemia associated with type 2 diabetes mellitus ([place]) 11/16/2012  . Diabetes mellitus ([place]) 11/16/2012  . 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 Obesity, unspecified 10/27/2012  . CKD (chronic kidney disease) stage 3, GFR 30-59 ml/min (HCC) 10/27/2012  . Hypertension, renal disease     Immunization History  Administered Date(s) Administered  . Fluad Quad(high Dose 65+) 03/07/2020  . Hepb-cpg 09/06/2019,  10/08/2019  . Influenza Split 01/22/2012  . Influenza, High Dose Seasonal PF 03/17/2017  . Influenza,inj,Quad PF,6+ Mos 01/21/2016, 01/25/2018, 01/15/2019  . Influenza-Unspecified 12/30/2012  . PFIZER(Purple Top)SARS-COV-2 Vaccination 08/25/2019, 09/15/2019, 03/26/2020  .  Pneumococcal Conjugate-13 08/29/2015  . Pneumococcal Polysaccharide-23 01/22/2012  . Td 09/06/2013    Conditions to be addressed/monitored:  Hypertension, Hyperlipidemia, Diabetes, Coronary Artery Disease and Chronic Kidney Disease  Patient Care Plan: CCM Pharmacy Care Plan    Problem Identified: HTN, HLD, CAD, DM2, CKD   Priority: High    Long-Range Goal: Disease management   Start Date: 04/25/2020  Expected End Date: 07/24/2021  This Visit's Progress: On track  Recent Progress: On track  Priority: High  Note:   Current Barriers:  . Unable to independently monitor therapeutic efficacy  Pharmacist Clinical Goal(s):  Marland Kitchen Patient will achieve adherence to monitoring guidelines and medication adherence to achieve therapeutic efficacy . achieve control of BP as evidenced by patient-reported BP log through collaboration with PharmD and provider.   Interventions: . 1:1 collaboration with Hoyt Koch, MD regarding development and update of comprehensive plan of care as evidenced by provider attestation and co-signature . Inter-disciplinary care team collaboration (see longitudinal plan of care) . Comprehensive medication review performed; medication list updated in electronic medical record  Hypertension / CKD stage 3a (BP goal <140/90) -Controlled - pt reports BP is usually less than or around 140/90 at home since switching losartan to irbesartan  -BP meds per nephrology (Dr Moshe Cipro) -Current treatment:  . Clonidine patch 0.3 mg/24hr weekly . Clonidine 0.2 mg TID . Irbesartan 300 mg daily (PCP) . Minoxidil 2.5 mg - 3 tab BID . Bystolic 5 mg daily . Spironolactone 100 mg daily -Medications  previously tried: amlodipine, HCTZ, benazepril, carvedilol, hydralazine -Current home readings: <140/90 (checks daily) -Current dietary habits: avoids salt -Current exercise habits: limited during pandemic; used to go to water aerobics, now uses exercise bike at home -Denies hypotensive/hypertensive symptoms -Educated on BP goals and benefits of medications for prevention of heart attack, stroke and kidney damage; Daily salt intake goal < 2300 mg; Exercise goal of 150 minutes per week; Importance of home blood pressure monitoring; -Counseled to monitor BP at home daily, document, and provide log at future appointments -Recommend to continue current medication  Hyperlipidemia / CAD: (LDL goal < 70) -Not ideally controlled - LDL < 70 is ideal goal given atherosclerosis on CT -aortic atherosclerosis on CT 04/2019 -Current treatment: . Atorvastatin 20 mg daily . OTC omega-3 fish oil 1000 mg -Medications previously tried: none -Educated on Cholesterol goals;  Benefits of statin for ASCVD risk reduction; Importance of limiting foods high in cholesterol -Counseled on diet and exercise extensively - encouraged 150 min/week of moderate exercise Recommended to continue current medication  Diabetes (A1c goal <7%) -Controlled - A1c is at goal and home BG are within goal range -Current medications: Marland Kitchen Metformin 500 mg BID -Medications previously tried: Januvia  -Current home glucose readings . fasting glucose: 120-140 . post prandial glucose: <150 -Denies hypoglycemic/hyperglycemic symptoms -Current meal patterns: chicken, [place], green vegetables -Current exercise: exercise bike -Educated onA1c and blood sugar goals; Complications of diabetes including kidney damage, retinal damage, and cardiovascular disease; -Counseled on diet and exercise extensively Recommended to continue current medication  Health Maintenance -Vaccine gaps: Covid booster (12/26), Shingrix, Flu -Current therapy:   Marland Kitchen Vitamin D 1000 IU daily . Multivitamin . Vitamin B12 . Elderberry . Excedrin (ASA-APAP-caffeine) prn -Patient is satisfied with current therapy and denies issues -Recommended to Shingrix and yearly flu vaccines  Patient Goals/Self-Care Activities . Patient will:  - take medications as prescribed check blood pressure daily, document, and provide at future appointments target a minimum of 150 minutes of moderate intensity exercise  weekly  Follow Up Plan: Telephone follow up appointment with care management team member scheduled for: 12 months     Medication Assistance: None required.  Patient affirms current coverage meets needs.  Patient's preferred pharmacy is:  [id number] - [address] [place] [address] Phone: [phone] Fax: [phone]  Uses pill box? Yes Pt endorses 100% compliance  We discussed: Current pharmacy is preferred with insurance plan and patient is satisfied with pharmacy services Patient decided to: Continue current medication management strategy  Care Plan and Follow Up Patient Decision:  Patient agrees to Care Plan and Follow-up.  Plan: Telephone follow up appointment with care management team member scheduled for:  1 year  Charlene Brooke, PharmD, [place], CPP Clinical Pharmacist [facility] at [facility] [phone]

## 2020-07-24 NOTE — Patient Instructions (Signed)
Visit Information  Phone number for Pharmacist: [phone]  Goals Addressed   None    Patient Care Plan: CCM Pharmacy Care Plan    Problem Identified: HTN, HLD, CAD, DM2, CKD   Priority: High    Long-Range Goal: Disease management   Start Date: 04/25/2020  Expected End Date: 07/24/2021  This Visit's Progress: On track  Recent Progress: On track  Priority: High  Note:   Current Barriers:  . Unable to independently monitor therapeutic efficacy  Pharmacist Clinical Goal(s):  Marland Kitchen Patient will achieve adherence to monitoring guidelines and medication adherence to achieve therapeutic efficacy . achieve control of BP as evidenced by patient-reported BP log through collaboration with PharmD and provider.   Interventions: . 1:1 collaboration with Hoyt Koch, MD regarding development and update of comprehensive plan of care as evidenced by provider attestation and co-signature . Inter-disciplinary care team collaboration (see longitudinal plan of care) . Comprehensive medication review performed; medication list updated in electronic medical record  Hypertension / CKD stage 3a (BP goal <140/90) -Controlled - pt reports BP is usually less than or around 140/90 at home since switching losartan to irbesartan  -BP meds per nephrology (Dr Moshe Cipro) -Current treatment:  . Clonidine patch 0.3 mg/24hr weekly . Clonidine 0.2 mg TID . Irbesartan 300 mg daily (PCP) . Minoxidil 2.5 mg - 3 tab BID . Bystolic 5 mg daily . Spironolactone 100 mg daily -Medications previously tried: amlodipine, HCTZ, benazepril, carvedilol, hydralazine -Current home readings: <140/90 (checks daily) -Current dietary habits: avoids salt -Current exercise habits: limited during pandemic; used to go to water aerobics, now uses exercise bike at home -Denies hypotensive/hypertensive symptoms -Educated on BP goals and benefits of medications for prevention of heart attack, stroke and kidney damage; Daily  salt intake goal < 2300 mg; Exercise goal of 150 minutes per week; Importance of home blood pressure monitoring; -Counseled to monitor BP at home daily, document, and provide log at future appointments -Recommend to continue current medication  Hyperlipidemia / CAD: (LDL goal < 70) -Not ideally controlled - LDL < 70 is ideal goal given atherosclerosis on CT -aortic atherosclerosis on CT 04/2019 -Current treatment: . Atorvastatin 20 mg daily . OTC omega-3 fish oil 1000 mg -Medications previously tried: none -Educated on Cholesterol goals;  Benefits of statin for ASCVD risk reduction; Importance of limiting foods high in cholesterol -Counseled on diet and exercise extensively - encouraged 150 min/week of moderate exercise Recommended to continue current medication  Diabetes (A1c goal <7%) -Controlled - A1c is at goal and home BG are within goal range -Current medications: Marland Kitchen Metformin 500 mg BID -Medications previously tried: Januvia  -Current home glucose readings . fasting glucose: 120-140 . post prandial glucose: <150 -Denies hypoglycemic/hyperglycemic symptoms -Current meal patterns: chicken, [place], green vegetables -Current exercise: exercise bike -Educated onA1c and blood sugar goals; Complications of diabetes including kidney damage, retinal damage, and cardiovascular disease; -Counseled on diet and exercise extensively Recommended to continue current medication  Health Maintenance -Vaccine gaps: Covid booster (12/26), Shingrix, Flu -Current therapy:  Marland Kitchen Vitamin D 1000 IU daily . Multivitamin . Vitamin B12 . Elderberry . Excedrin (ASA-APAP-caffeine) prn -Patient is satisfied with current therapy and denies issues -Recommended to Shingrix and yearly flu vaccines  Patient Goals/Self-Care Activities . Patient will:  - take medications as prescribed check blood pressure daily, document, and provide at future appointments target a minimum of 150 minutes of moderate  intensity exercise weekly  Follow Up Plan: Telephone follow up appointment with care management team member  scheduled for: 12 months      Patient verbalizes understanding of instructions provided today and agrees to view in [place].  Telephone follow up appointment with pharmacy team member scheduled for: 1 year  Charlene Brooke, PharmD, [place], CPP Clinical Pharmacist [facility] at [facility] [phone]

## 2020-07-26 DIAGNOSIS — U071 COVID-19: Secondary | ICD-10-CM | POA: Diagnosis not present

## 2020-08-11 ENCOUNTER — Other Ambulatory Visit: Payer: Self-pay

## 2020-08-14 ENCOUNTER — Ambulatory Visit (INDEPENDENT_AMBULATORY_CARE_PROVIDER_SITE_OTHER): Payer: Medicare Other | Admitting: Internal Medicine

## 2020-08-14 ENCOUNTER — Other Ambulatory Visit: Payer: Self-pay

## 2020-08-14 ENCOUNTER — Encounter: Payer: Self-pay | Admitting: Internal Medicine

## 2020-08-14 VITALS — BP 150/80 | HR 72 | Temp 98.5°F | Ht 67.0 in | Wt 209.8 lb

## 2020-08-14 DIAGNOSIS — I700 Atherosclerosis of aorta: Secondary | ICD-10-CM | POA: Diagnosis not present

## 2020-08-14 DIAGNOSIS — G4734 Idiopathic sleep related nonobstructive alveolar hypoventilation: Secondary | ICD-10-CM | POA: Diagnosis not present

## 2020-08-14 NOTE — Progress Notes (Signed)
   Subjective:   Patient ID: Jade Sullivan, female    DOB: [birth year], 75 y.o.   MRN: [id number]  HPI The patient is a 75 YO female coming in for concerns about if she needs oxygen still at night time or not. She has not needed daytime oxygen in some time. Rarely she will fall asleep without it and wakes up with 92. She would like to repeat testing and stop if able. Denies chest pains or SOB. This was initiated after covid-19 and has not been long term prior to that but she has been using for more than a year now.   Review of Systems  Constitutional: Negative.   HENT: Negative.   Eyes: Negative.   Respiratory: Negative for cough, chest tightness and shortness of breath.   Cardiovascular: Negative for chest pain, palpitations and leg swelling.  Gastrointestinal: Negative for abdominal distention, abdominal pain, constipation, diarrhea, nausea and vomiting.  Musculoskeletal: Negative.   Skin: Negative.   Neurological: Negative.   Psychiatric/Behavioral: Negative.     Objective:  Physical Exam Constitutional:      Appearance: She is well-developed.  HENT:     Head: Normocephalic and atraumatic.  Cardiovascular:     Rate and Rhythm: Normal rate and regular rhythm.  Pulmonary:     Effort: Pulmonary effort is normal. No respiratory distress.     Breath sounds: Normal breath sounds. No wheezing or rales.  Abdominal:     General: Bowel sounds are normal. There is no distension.     Palpations: Abdomen is soft.     Tenderness: There is no abdominal tenderness. There is no rebound.  Musculoskeletal:     Cervical back: Normal range of motion.  Skin:    General: Skin is warm and dry.  Neurological:     Mental Status: She is alert and oriented to person, place, and time.     Coordination: Coordination normal.     Vitals:   08/14/20 1035  BP: (!) 150/80  Pulse: 72  Temp: 98.5 F (36.9 C)  TempSrc: Oral  SpO2: 95%  Weight: 209 lb 12.8 oz (95.2 kg)  Height: 5\' 7"  (1.702 m)     This visit occurred during the SARS-CoV-2 public health emergency.  Safety protocols were in place, including screening questions prior to the visit, additional usage of staff PPE, and extensive cleaning of exam room while observing appropriate contact time as indicated for disinfecting solutions.   Assessment & Plan:

## 2020-08-14 NOTE — Patient Instructions (Addendum)
You are doing well.   We will recheck the oxygen test for night time to see if you still need the oxygen at night time.

## 2020-08-15 NOTE — Assessment & Plan Note (Signed)
Ordered home health to do nocturnal pulse oximetry to see if she still needs night time O2. Does not require this with walking any longer. Adjust as needed.

## 2020-08-15 NOTE — Assessment & Plan Note (Signed)
On lipitor 20 mg daily.  ?

## 2020-08-19 DIAGNOSIS — U071 COVID-19: Secondary | ICD-10-CM | POA: Diagnosis not present

## 2020-08-25 DIAGNOSIS — U071 COVID-19: Secondary | ICD-10-CM | POA: Diagnosis not present

## 2020-09-19 DIAGNOSIS — U071 COVID-19: Secondary | ICD-10-CM | POA: Diagnosis not present

## 2020-09-25 DIAGNOSIS — U071 COVID-19: Secondary | ICD-10-CM | POA: Diagnosis not present

## 2020-10-03 ENCOUNTER — Other Ambulatory Visit: Payer: Self-pay

## 2020-10-03 ENCOUNTER — Ambulatory Visit (INDEPENDENT_AMBULATORY_CARE_PROVIDER_SITE_OTHER): Payer: Medicare Other

## 2020-10-03 VITALS — BP 140/80 | HR 80 | Temp 97.7°F | Ht 67.0 in | Wt 213.4 lb

## 2020-10-03 DIAGNOSIS — Z0000 Encounter for general adult medical examination without abnormal findings: Secondary | ICD-10-CM | POA: Diagnosis not present

## 2020-10-03 NOTE — Progress Notes (Signed)
Subjective:   Jade Sullivan is a 75 y.o. female who presents for Medicare Annual (Subsequent) preventive examination.  Review of Systems     Cardiac Risk Factors include: advanced age (>54mn, >>23women);diabetes mellitus;dyslipidemia;family history of premature cardiovascular disease;hypertension;obesity (BMI >30kg/m2)     Objective:    Today's Vitals   10/03/20 1537 10/03/20 1539  BP:  140/80  Pulse:  80  Temp:  97.7 F (36.5 C)  SpO2:  95%  Weight:  213 lb 6.4 oz (96.8 kg)  Height:  _0  (1.702 m)  PainSc: 0-No pain 0-No pain   Body mass index is 33.42 kg/m.  Advanced Directives 10/03/2020 06/27/2020 06/25/2019 04/14/2019 04/13/2019 05/12/2018 02/20/2015  Does Patient Have a Medical Advance Directive? _1  No No  Does patient want to make changes to medical advance directive? No - Patient declined - - - - - -  Would patient like information on creating a medical advance directive? - No - Patient declined No - Patient declined No - Patient declined No - Patient declined No - Patient declined -    Current Medications (verified) Outpatient Encounter Medications as of 10/03/2020  Medication Sig   aspirin EC 81 MG tablet Take 81 mg by mouth every evening. Swallow whole.   aspirin-acetaminophen-caffeine (EXCEDRIN MIGRAINE) 250-250-65 MG tablet Take 1-2 tablets by mouth every 6 (six) hours as needed for headache (sinus headaches/pain).   atorvastatin (LIPITOR) 20 MG tablet TAKE 1 TABLET(20 MG) BY MOUTH DAILY (Patient taking differently: Take 20 mg by mouth at bedtime.)   cholecalciferol (VITAMIN D3) 25 MCG (1000 UT) tablet Take 1,000 Units by mouth daily.   cloNIDine (CATAPRES - DOSED IN MG/24 HR) 0.3 mg/24hr patch Place 0.3 mg onto the skin every Wednesday.   cloNIDine (CATAPRES) 0.2 MG tablet Take 0.2 mg by mouth 3 (three) times daily.   fish oil-omega-3 fatty acids 1000 MG capsule Take 2 g by mouth daily.   irbesartan (AVAPRO) 300 MG tablet Take 1 tablet (300 mg total)  by mouth in the morning.   latanoprost (XALATAN) 0.005 % ophthalmic solution Place 1 drop into both eyes at bedtime.    levothyroxine (SYNTHROID) 50 MCG tablet TAKE 1 TABLET(50 MCG) BY MOUTH DAILY BEFORE BREAKFAST (Patient taking differently: Take 50 mcg by mouth daily before breakfast.)   metFORMIN (GLUCOPHAGE) 500 MG tablet Take 1 tablet (500 mg total) by mouth 2 (two) times daily with a meal.   minoxidil (LONITEN) 2.5 MG tablet Take 5 mg by mouth in the morning, at noon, and at bedtime.   Multiple Vitamin (MULTIVITAMIN WITH MINERALS) TABS tablet Take 1 tablet by mouth every evening.   nebivolol (BYSTOLIC) 5 MG tablet Take 5 mg by mouth in the morning.   Omega-3 Fatty Acids (FISH OIL) 1000 MG CAPS Take 1,000 mg by mouth at bedtime.   spironolactone (ALDACTONE) 100 MG tablet Take 100 mg by mouth every evening.   timolol (TIMOPTIC) 0.5 % ophthalmic solution Place 1 drop into both eyes 2 (two) times daily.   vitamin B-12 (CYANOCOBALAMIN) 500 MCG tablet Take 1,000 mcg by mouth every evening.   No facility-administered encounter medications on file as of 10/03/2020.    Allergies (verified) Amlodipine and Hctz [hydrochlorothiazide]   History: Past Medical History:  Diagnosis Date   Allergy    seasonal allergies   Arthritis    bilateral knees   Cataract    not a surgical candidate at this time (05/29/2020)   Chronic kidney disease  COVID-19 04/2019   Diabetes mellitus without complication ([place])    on meds   Diverticulosis of colon (without mention of hemorrhage)    Dysmetabolic syndrome X    Glaucoma    on meds   Gout    H/O hyperthyroidism    By patient h/o hyperthyroidism with protosis, s/p subtotal thyroidectomy. On no replacement therapy. Last thyroid functions 2013 in [place]  Plan Will need thyroid functions Feb '15 with recommendations to follow.     Heart murmur    hx of   Hyperlipidemia    on meds   Hypertension    on multiple meds   Hypothyroidism    on meds    Kidney lesion    On supplemental oxygen by nasal cannula    at bedtime   Pancreatic cyst    Tubular adenoma of colon    Past Surgical History:  Procedure Laterality Date   BIOPSY  06/27/2020   Procedure: BIOPSY;  Surgeon: Jerene Bears, MD;  Location: [facility];  Service: Gastroenterology;;   [place]  2016   JMP-MAC-2 day suprep (good)-TICS/TA   COLONOSCOPY WITH PROPOFOL N/A 06/27/2020   Procedure: COLONOSCOPY WITH PROPOFOL;  Surgeon: Jerene Bears, MD;  Location: [facility];  Service: Gastroenterology;  Laterality: N/A;   EYE SURGERY     LIPOMA EXCISION Right 11/24/2014   Procedure: EXCISION RIGHT SHOULDER LIPOMA;  Surgeon: Erroll Luna, MD;  Location: [facility];  Service: General;  Laterality: Right;   POLYPECTOMY  2016   TA   POLYPECTOMY  06/27/2020   Procedure: POLYPECTOMY;  Surgeon: Jerene Bears, MD;  Location: [facility];  Service: Gastroenterology;;   THYROIDECTOMY, PARTIAL  1981   TONSILLECTOMY     UMBILICAL HERNIA REPAIR  2000   WISDOM TOOTH EXTRACTION     Family History  Problem Relation Age of Onset   Diabetes Mother        DM2   Hypertension Mother    Pneumonia Mother        died from this condition   Colon polyps Mother    Heart disease Father        died from this condition   Colon polyps Father    Kidney disease Sister        HBP and DM2; relative died from this condition   Colon polyps Sister    Heart disease Brother        MI - HBP and DM2   Colon polyps Brother    Colon polyps Brother    Colon cancer Neg Hx    Esophageal cancer Neg Hx    Rectal cancer Neg Hx    Stomach cancer Neg Hx    Social History   Socioeconomic History   Marital status: Divorced    Spouse name: Not on file   Number of children: 1   Years of education: Not on file   Highest education level: Not on file  Occupational History   Occupation: Retired  Tobacco Use   Smoking status: Former    Pack years: 0.00   Smokeless  tobacco: Never  Vaping Use   Vaping Use: Never used  Substance and Sexual Activity   Alcohol use: No    Alcohol/week: 0.0 standard drinks   Drug use: No   Sexual activity: Not Currently    Comment: 1st intercourse- 16, partners- 62, divorced  Other Topics Concern   Not on file  Social History Narrative  Divorced   Retired - Engineer, maintenance (IT)   Social Determinants of Radio broadcast assistant Strain: Low Risk    Difficulty of Paying Living Expenses: Not hard at all  Food Insecurity: No Food Insecurity   Worried About Charity fundraiser in the Last Year: Never true   Arboriculturist in the Last Year: Never true  Transportation Needs: No Transportation Needs   Lack of Transportation (Medical): No   Lack of Transportation (Non-Medical): No  Physical Activity: Insufficiently Active   Days of Exercise per Week: 7 days   Minutes of Exercise per Session: 20 min  Stress: No Stress Concern Present   Feeling of Stress : Not at all  Social Connections: Moderately Integrated   Frequency of Communication with Friends and Family: More than three times a week   Frequency of Social Gatherings with Friends and Family: Twice a week   Attends Religious Services: More than 4 times per year   Active Member of [employer] or Organizations: Yes   Attends Music therapist: More than 4 times per year   Marital Status: Divorced    Tobacco Counseling Counseling given: Not Answered   Clinical Intake:  Pre-visit preparation completed: Yes  Pain : No/denies pain Pain Score: 0-No pain     BMI - recorded: 33.42 Nutritional Status: BMI > 30  Obese Nutritional Risks: None Diabetes: Yes CBG done?: No Did pt. bring in CBG monitor from home?: No  How often do you need to have someone help you when you read instructions, pamphlets, or other written materials from your doctor or pharmacy?: 1 - Never What is the last grade level you completed in school?: [employer]; 1 year of  [employer]  Diabetic? yes  Interpreter Needed?: No  Information entered by :: Lisette Abu, LPN   Activities of Daily Living In your present state of health, do you have any difficulty performing the following activities: 10/03/2020  Hearing? N  Vision? N  Difficulty concentrating or making decisions? N  Walking or climbing stairs? N  Dressing or bathing? N  Doing errands, shopping? N  Preparing Food and eating ? N  Using the Toilet? N  In the past six months, have you accidently leaked urine? N  Do you have problems with loss of bowel control? N  Managing your Medications? N  Managing your Finances? N  Housekeeping or managing your Housekeeping? N  Some recent data might be hidden    Patient Care Team: Hoyt Koch, MD as PCP - General (Internal Medicine) Princess Bruins, MD as Consulting Physician (Obstetrics and Gynecology) Hilarie Fredrickson, Lajuan Lines, MD as Consulting Physician (Gastroenterology) Corliss Parish, MD as Consulting Physician (Nephrology) [facility] (Ophthalmology) Charlton Haws, [facility] as Pharmacist (Pharmacist) Alanda Slim, Neena Rhymes, MD as Consulting Physician (Ophthalmology)  Indicate any recent Medical Services you may have received from other than [facility] providers in the past year (date may be approximate).     Assessment:   This is a routine wellness examination for Kelcee.  Hearing/Vision screen Hearing Screening - Comments:: Patient denied any hearing difficulty. Vision Screening - Comments:: Patient wears reading glasses.  Currently has glaucoma and cataracts forming.  Being managed by [employer].  Dietary issues and exercise activities discussed: Current Exercise Habits: Home exercise routine, Type of exercise: walking;Other - see comments (uses [place] everyday (goal is 500 cycles per day)), Time (Minutes): 20, Frequency (Times/Week): 7, Weekly Exercise (Minutes/Week): 140, Intensity: Mild, Exercise  limited  by: None identified   Goals Addressed               This Visit's Progress     Patient Stated (pt-stated)        My healthcare goals for 2022 is to get off of oxygen.       Depression Screen PHQ 2/9 Scores 10/03/2020 08/14/2020 06/25/2019 06/11/2019 05/25/2019 05/12/2018 05/12/2018  PHQ - 2 Score 0 0 0 0 0 0 0    Fall Risk Fall Risk  08/14/2020 06/25/2019 06/11/2019 05/25/2019 05/12/2018  Falls in the past year? 0 0 0 0 0  Number falls in past yr: 0 0 0 - -  Injury with Fall? - 0 0 - -  Risk for fall due to : No Fall Risks No Fall Risks - Impaired mobility -  Follow up Falls evaluation completed Falls evaluation completed;Falls prevention discussed - Falls evaluation completed -    FALL RISK PREVENTION PERTAINING TO THE HOME:  Any stairs in or around the home? Yes  (has a stair lift) If so, are there any without handrails? No  Home free of loose throw rugs in walkways, pet beds, electrical cords, etc? Yes  Adequate lighting in your home to reduce risk of falls? Yes   ASSISTIVE DEVICES UTILIZED TO PREVENT FALLS:  Life alert? No  Use of a cane, walker or w/c? No  Grab bars in the bathroom? Yes  Shower chair or bench in shower? Yes  Elevated toilet seat or a handicapped toilet? Yes   TIMED UP AND GO:  Was the test performed? Yes .  Length of time to ambulate 10 feet: 7 sec.   Gait steady and fast without use of assistive device  Cognitive Function: Normal cognitive status assessed by direct observation by this Nurse Health Advisor. No abnormalities found.       6CIT Screen 06/25/2019  What Year? 0 points  What month? 0 points  What time? 0 points  Count back from 20 0 points  Months in reverse 0 points  Repeat phrase 0 points  Total Score 0    Immunizations Immunization History  Administered Date(s) Administered   Fluad Quad(high Dose 65+) 03/07/2020   Hepb-cpg 09/06/2019, 10/08/2019   Influenza Split 01/22/2012   Influenza, High Dose Seasonal PF  03/17/2017   Influenza,inj,Quad PF,6+ Mos 01/21/2016, 01/25/2018, 01/15/2019   Influenza-Unspecified 12/30/2012   PFIZER(Purple Top)SARS-COV-2 Vaccination 08/25/2019, 09/15/2019, 03/26/2020   Pneumococcal Conjugate-13 08/29/2015   Pneumococcal Polysaccharide-23 01/22/2012   Td 09/06/2013    TDAP status: Up to date  Flu Vaccine status: Up to date  Pneumococcal vaccine status: Up to date  Covid-19 vaccine status: Completed vaccines  Qualifies for Shingles Vaccine? Yes   Zostavax completed No   Shingrix Completed?: No.    Education has been provided regarding the importance of this vaccine. Patient has been advised to call insurance company to determine out of pocket expense if they have not yet received this vaccine. Advised may also receive vaccine at local pharmacy or Health Dept. Verbalized acceptance and understanding.  Screening Tests Health Maintenance  Topic Date Due   Zoster Vaccines- Shingrix (1 of 2) Never done   FOOT EXAM  06/10/2020   COVID-19 Vaccine (4 - Booster for Pfizer series) 07/25/2020   INFLUENZA VACCINE  10/30/2020   HEMOGLOBIN A1C  11/14/2020   OPHTHALMOLOGY EXAM  05/08/2021   MAMMOGRAM  01/06/2022   TETANUS/TDAP  09/07/2023   COLONOSCOPY (Pts 45-66yr Insurance coverage will need to be confirmed)  06/27/2025  DEXA SCAN  Completed   Hepatitis C Screening  Completed   PNA vac Low Risk Adult  Completed   HPV VACCINES  Aged Out    Health Maintenance  Health Maintenance Due  Topic Date Due   Zoster Vaccines- Shingrix (1 of 2) Never done   FOOT EXAM  06/10/2020   COVID-19 Vaccine (4 - Booster for Pfizer series) 07/25/2020    Colorectal cancer screening: Type of screening: Colonoscopy. Completed 06/27/2020. Repeat every 5 years  Mammogram status: Completed 01/07/2020. Repeat every year  Bone Density status: Completed 03/07/2016. Results reflect: Bone density results: NORMAL. Repeat every 5 years.  Lung Cancer Screening: (Low Dose CT Chest recommended  if Age 57-80 years, 30 pack-year currently smoking OR have quit w/in 15years.) does not qualify.   Lung Cancer Screening Referral: no  Additional Screening:  Hepatitis C Screening: does qualify; Completed: yes  Vision Screening: Recommended annual ophthalmology exams for early detection of glaucoma and other disorders of the eye. Is the patient up to date with their annual eye exam?  Yes  Who is the provider or what is the name of the office in which the patient attends annual eye exams? [facility] If pt is not established with a provider, would they like to be referred to a provider to establish care? No .   Dental Screening: Recommended annual dental exams for proper oral hygiene  Community Resource Referral / Chronic Care Management: CRR required this visit?  No   CCM required this visit?  No      Plan:     I have personally reviewed and noted the following in the patient's chart:   Medical and social history Use of alcohol, tobacco or illicit drugs  Current medications and supplements including opioid prescriptions.  Functional ability and status Nutritional status Physical activity Advanced directives List of other physicians Hospitalizations, surgeries, and ER visits in previous 12 months Vitals Screenings to include cognitive, depression, and falls Referrals and appointments  In addition, I have reviewed and discussed with patient certain preventive protocols, quality metrics, and best practice recommendations. A written personalized care plan for preventive services as well as general preventive health recommendations were provided to patient.     Sheral Flow, LPN   06/07/1827   Nurse Notes: n/a

## 2020-10-03 NOTE — Patient Instructions (Signed)
Jade Sullivan , Thank you for taking time to come for your Medicare Wellness Visit. I appreciate your ongoing commitment to your health goals. Please review the following plan we discussed and let me know if I can assist you in the future.   Screening recommendations/referrals: Colonoscopy: last done 06/27/2020; due every 5 years (due 07/2025) Mammogram: last done 01/07/2020; due every 1-2 years (due 12/2021) Bone Density: last done 03/07/2016; due every 5 years (due 03/2021) Recommended yearly ophthalmology/optometry visit for glaucoma screening and checkup Recommended yearly dental visit for hygiene and checkup  Vaccinations: Influenza vaccine: 03/07/2020 Pneumococcal vaccine: 01/22/2012, 08/29/2015 Tdap vaccine: 09/06/2013; due every 10 years Shingles vaccine: never done   Covid-19: 08/25/2019, 09/15/2019, 03/26/2020  Advanced directives: Advance directive discussed with you today. Even though you declined this today please call our office should you change your mind and we can give you the proper paperwork for you to fill out.  Conditions/risks identified: My healthcare goal for 2022 is to get off oxygen.  Next appointment: Please schedule your next Medicare Wellness Visit with your Nurse Health Advisor in 1 year by calling [phone].   Preventive Care 75 Years and Older, Female Preventive care refers to lifestyle choices and visits with your health care provider that can promote health and wellness. What does preventive care include? A yearly physical exam. This is also called an annual well check. Dental exams once or twice a year. Routine eye exams. Ask your health care provider how often you should have your eyes checked. Personal lifestyle choices, including: Daily care of your teeth and gums. Regular physical activity. Eating a healthy diet. Avoiding tobacco and drug use. Limiting alcohol use. Practicing safe sex. Taking low-dose aspirin every day. Taking vitamin and mineral  supplements as recommended by your health care provider. What happens during an annual well check? The services and screenings done by your health care provider during your annual well check will depend on your age, overall health, lifestyle risk factors, and family history of disease. Counseling  Your health care provider may ask you questions about your: Alcohol use. Tobacco use. Drug use. Emotional well-being. Home and relationship well-being. Sexual activity. Eating habits. History of falls. Memory and ability to understand (cognition). Work and work Statistician. Reproductive health. Screening  You may have the following tests or measurements: Height, weight, and BMI. Blood pressure. Lipid and cholesterol levels. These may be checked every 5 years, or more frequently if you are over 75 years old. Skin check. Lung cancer screening. You may have this screening every year starting at age 73 if you have a 30-pack-year history of smoking and currently smoke or have quit within the past 15 years. Fecal occult blood test (FOBT) of the stool. You may have this test every year starting at age 76. Flexible sigmoidoscopy or colonoscopy. You may have a sigmoidoscopy every 5 years or a colonoscopy every 10 years starting at age 80. Hepatitis C blood test. Hepatitis B blood test. Sexually transmitted disease (STD) testing. Diabetes screening. This is done by checking your blood sugar (glucose) after you have not eaten for a while (fasting). You may have this done every 1-3 years. Bone density scan. This is done to screen for osteoporosis. You may have this done starting at age 76. Mammogram. This may be done every 1-2 years. Talk to your health care provider about how often you should have regular mammograms. Talk with your health care provider about your test results, treatment options, and if necessary, the need for  more tests. Vaccines  Your health care provider may recommend certain  vaccines, such as: Influenza vaccine. This is recommended every year. Tetanus, diphtheria, and acellular pertussis (Tdap, Td) vaccine. You may need a Td booster every 10 years. Zoster vaccine. You may need this after age 36. Pneumococcal 13-valent conjugate (PCV13) vaccine. One dose is recommended after age 50. Pneumococcal polysaccharide (PPSV23) vaccine. One dose is recommended after age 53. Talk to your health care provider about which screenings and vaccines you need and how often you need them. This information is not intended to replace advice given to you by your health care provider. Make sure you discuss any questions you have with your health care provider. Document Released: 04/14/2015 Document Revised: 12/06/2015 Document Reviewed: 01/17/2015 Elsevier Interactive Patient Education  2017 [place] Prevention in the Home Falls can cause injuries. They can happen to people of all ages. There are many things you can do to make your home safe and to help prevent falls. What can I do on the outside of my home? Regularly fix the edges of walkways and driveways and fix any cracks. Remove anything that might make you trip as you walk through a door, such as a raised step or threshold. Trim any bushes or trees on the path to your home. Use bright outdoor lighting. Clear any walking paths of anything that might make someone trip, such as rocks or tools. Regularly check to see if handrails are loose or broken. Make sure that both sides of any steps have handrails. Any raised decks and porches should have guardrails on the edges. Have any leaves, snow, or ice cleared regularly. Use sand or salt on walking paths during winter. Clean up any spills in your garage right away. This includes oil or grease spills. What can I do in the bathroom? Use night lights. Install grab bars by the toilet and in the tub and shower. Do not use towel bars as grab bars. Use non-skid mats or decals in  the tub or shower. If you need to sit down in the shower, use a plastic, non-slip stool. Keep the floor dry. Clean up any water that spills on the floor as soon as it happens. Remove soap buildup in the tub or shower regularly. Attach bath mats securely with double-sided non-slip rug tape. Do not have throw rugs and other things on the floor that can make you trip. What can I do in the bedroom? Use night lights. Make sure that you have a light by your bed that is easy to reach. Do not use any sheets or blankets that are too big for your bed. They should not hang down onto the floor. Have a firm chair that has side arms. You can use this for support while you get dressed. Do not have throw rugs and other things on the floor that can make you trip. What can I do in the kitchen? Clean up any spills right away. Avoid walking on wet floors. Keep items that you use a lot in easy-to-reach places. If you need to reach something above you, use a strong step stool that has a grab bar. Keep electrical cords out of the way. Do not use floor polish or wax that makes floors slippery. If you must use wax, use non-skid floor wax. Do not have throw rugs and other things on the floor that can make you trip. What can I do with my stairs? Do not leave any items on the stairs. Make  sure that there are handrails on both sides of the stairs and use them. Fix handrails that are broken or loose. Make sure that handrails are as long as the stairways. Check any carpeting to make sure that it is firmly attached to the stairs. Fix any carpet that is loose or worn. Avoid having throw rugs at the top or bottom of the stairs. If you do have throw rugs, attach them to the floor with carpet tape. Make sure that you have a light switch at the top of the stairs and the bottom of the stairs. If you do not have them, ask someone to add them for you. What else can I do to help prevent falls? Wear shoes that: Do not have high  heels. Have rubber bottoms. Are comfortable and fit you well. Are closed at the toe. Do not wear sandals. If you use a stepladder: Make sure that it is fully opened. Do not climb a closed stepladder. Make sure that both sides of the stepladder are locked into place. Ask someone to hold it for you, if possible. Clearly mark and make sure that you can see: Any grab bars or handrails. First and last steps. Where the edge of each step is. Use tools that help you move around (mobility aids) if they are needed. These include: Canes. Walkers. Scooters. Crutches. Turn on the lights when you go into a dark area. Replace any light bulbs as soon as they burn out. Set up your furniture so you have a clear path. Avoid moving your furniture around. If any of your floors are uneven, fix them. If there are any pets around you, be aware of where they are. Review your medicines with your doctor. Some medicines can make you feel dizzy. This can increase your chance of falling. Ask your doctor what other things that you can do to help prevent falls. This information is not intended to replace advice given to you by your health care provider. Make sure you discuss any questions you have with your health care provider. Document Released: 01/12/2009 Document Revised: 08/24/2015 Document Reviewed: 04/22/2014 Elsevier Interactive Patient Education  2017 [employer].

## 2020-10-05 NOTE — Progress Notes (Signed)
[place] [address] [place] Phone: [phone] Subjective:   I Jade Sullivan am serving as a Education administrator for Dr. Hulan Saas.  This visit occurred during the SARS-CoV-2 public health emergency.  Safety protocols were in place, including screening questions prior to the visit, additional usage of staff PPE, and extensive cleaning of exam room while observing appropriate contact time as indicated for disinfecting solutions.   I'm seeing this patient by the request  of:  Hoyt Koch, MD  CC: Knee pain  HUT:[id number]  Jade Sullivan is a 75 y.o. female coming in with complaint of B knee pain. Last seen in 2019. Patient states she went out of town and states she believes she was on her knees a lot. States she twisted the right knee and could hardly walk. Would like bilateral injections.  We have seen patient previously for this and last injection was greater than 3 years ago.  Has known arthritic changes of the knee.   Patient's last x-rays from 2016 of patient's right knee showed severe osteoarthritic changes.  This was independently visualized by me   Past Medical History:  Diagnosis Date   Allergy    seasonal allergies   Arthritis    bilateral knees   Cataract    not a surgical candidate at this time (05/29/2020)   Chronic kidney disease    COVID-19 04/2019   Diabetes mellitus without complication ([place])    on meds   Diverticulosis of colon (without mention of hemorrhage)    Dysmetabolic syndrome X    Glaucoma    on meds   Gout    H/O hyperthyroidism    By patient h/o hyperthyroidism with protosis, s/p subtotal thyroidectomy. On no replacement therapy. Last thyroid functions 2013 in [place]  Plan Will need thyroid functions Feb '15 with recommendations to follow.     Heart murmur    hx of   Hyperlipidemia    on meds   Hypertension    on multiple meds   Hypothyroidism    on meds   Kidney lesion    On  supplemental oxygen by nasal cannula    at bedtime   Pancreatic cyst    Tubular adenoma of colon    Past Surgical History:  Procedure Laterality Date   BIOPSY  06/27/2020   Procedure: BIOPSY;  Surgeon: Jerene Bears, MD;  Location: [facility];  Service: Gastroenterology;;   [place]  2016   JMP-MAC-2 day suprep (good)-TICS/TA   COLONOSCOPY WITH PROPOFOL N/A 06/27/2020   Procedure: COLONOSCOPY WITH PROPOFOL;  Surgeon: Jerene Bears, MD;  Location: [facility];  Service: Gastroenterology;  Laterality: N/A;   EYE SURGERY     LIPOMA EXCISION Right 11/24/2014   Procedure: EXCISION RIGHT SHOULDER LIPOMA;  Surgeon: Erroll Luna, MD;  Location: [facility];  Service: General;  Laterality: Right;   POLYPECTOMY  2016   TA   POLYPECTOMY  06/27/2020   Procedure: POLYPECTOMY;  Surgeon: Jerene Bears, MD;  Location: [facility];  Service: Gastroenterology;;   THYROIDECTOMY, PARTIAL  1981   TONSILLECTOMY     UMBILICAL HERNIA REPAIR  2000   WISDOM TOOTH EXTRACTION     Social History   Socioeconomic History   Marital status: Divorced    Spouse name: Not on file   Number of children: 1   Years of education: Not on file   Highest education level: Not on file  Occupational History   Occupation: Retired  Tobacco Use   Smoking status: Former   Smokeless tobacco: Never  Scientific laboratory technician Use: Never used  Substance and Sexual Activity   Alcohol use: No    Alcohol/week: 0.0 standard drinks   Drug use: No   Sexual activity: Not Currently    Comment: 1st intercourse- 16, partners- 74, divorced  Other Topics Concern   Not on file  Social History Narrative   Divorced   Retired - Engineer, maintenance (IT)   Social Determinants of Radio broadcast assistant Strain: Low Risk    Difficulty of Paying Living Expenses: Not hard at all  Food Insecurity: No Food Insecurity   Worried About Charity fundraiser in the Last Year: Never true   Arboriculturist in  the Last Year: Never true  Transportation Needs: No Transportation Needs   Lack of Transportation (Medical): No   Lack of Transportation (Non-Medical): No  Physical Activity: Insufficiently Active   Days of Exercise per Week: 7 days   Minutes of Exercise per Session: 20 min  Stress: No Stress Concern Present   Feeling of Stress : Not at all  Social Connections: Moderately Integrated   Frequency of Communication with Friends and Family: More than three times a week   Frequency of Social Gatherings with Friends and Family: Twice a week   Attends Religious Services: More than 4 times per year   Active Member of [employer] or Organizations: Yes   Attends Music therapist: More than 4 times per year   Marital Status: Divorced   Allergies  Allergen Reactions   Amlodipine     Gingival hyperplasia   Hctz [Hydrochlorothiazide] Other (See Comments)    gout   Family History  Problem Relation Age of Onset   Diabetes Mother        DM2   Hypertension Mother    Pneumonia Mother        died from this condition   Colon polyps Mother    Heart disease Father        died from this condition   Colon polyps Father    Kidney disease Sister        HBP and DM2; relative died from this condition   Colon polyps Sister    Heart disease Brother        MI - HBP and DM2   Colon polyps Brother    Colon polyps Brother    Colon cancer Neg Hx    Esophageal cancer Neg Hx    Rectal cancer Neg Hx    Stomach cancer Neg Hx     Current Outpatient Medications (Endocrine & Metabolic):    levothyroxine (SYNTHROID) 50 MCG tablet, TAKE 1 TABLET(50 MCG) BY MOUTH DAILY BEFORE BREAKFAST (Patient taking differently: Take 50 mcg by mouth daily before breakfast.)   metFORMIN (GLUCOPHAGE) 500 MG tablet, Take 1 tablet (500 mg total) by mouth 2 (two) times daily with a meal.  Current Outpatient Medications (Cardiovascular):    atorvastatin (LIPITOR) 20 MG tablet, TAKE 1 TABLET(20 MG) BY MOUTH DAILY (Patient  taking differently: Take 20 mg by mouth at bedtime.)   cloNIDine (CATAPRES - DOSED IN MG/24 HR) 0.3 mg/24hr patch, Place 0.3 mg onto the skin every Wednesday.   cloNIDine (CATAPRES) 0.2 MG tablet, Take 0.2 mg by mouth 3 (three) times daily.   irbesartan (AVAPRO) 300 MG tablet, Take 1 tablet (300 mg total) by mouth in the morning.  minoxidil (LONITEN) 2.5 MG tablet, Take 5 mg by mouth in the morning, at noon, and at bedtime.   nebivolol (BYSTOLIC) 5 MG tablet, Take 5 mg by mouth in the morning.   spironolactone (ALDACTONE) 100 MG tablet, Take 100 mg by mouth every evening.   Current Outpatient Medications (Analgesics):    aspirin EC 81 MG tablet, Take 81 mg by mouth every evening. Swallow whole.   aspirin-acetaminophen-caffeine (EXCEDRIN MIGRAINE) 250-250-65 MG tablet, Take 1-2 tablets by mouth every 6 (six) hours as needed for headache (sinus headaches/pain).  Current Outpatient Medications (Hematological):    vitamin B-12 (CYANOCOBALAMIN) 500 MCG tablet, Take 1,000 mcg by mouth every evening.  Current Outpatient Medications (Other):    cholecalciferol (VITAMIN D3) 25 MCG (1000 UT) tablet, Take 1,000 Units by mouth daily.   fish oil-omega-3 fatty acids 1000 MG capsule, Take 2 g by mouth daily.   latanoprost (XALATAN) 0.005 % ophthalmic solution, Place 1 drop into both eyes at bedtime.    Multiple Vitamin (MULTIVITAMIN WITH MINERALS) TABS tablet, Take 1 tablet by mouth every evening.   Omega-3 Fatty Acids (FISH OIL) 1000 MG CAPS, Take 1,000 mg by mouth at bedtime.   timolol (TIMOPTIC) 0.5 % ophthalmic solution, Place 1 drop into both eyes 2 (two) times daily.   Reviewed prior external information including notes and imaging from  primary care provider As well as notes that were available from care everywhere and other healthcare systems.  Past medical history, social, surgical and family history all reviewed in electronic medical record.  No pertanent information unless stated regarding  to the chief complaint.   Review of Systems:  No headache, visual changes, nausea, vomiting, diarrhea, constipation, dizziness, abdominal pain, skin rash, fevers, chills, night sweats, weight loss, swollen lymph nodes,  chest pain, shortness of breath, mood changes. POSITIVE muscle aches, body aches, joint swelling  Objective  There were no vitals taken for this visit.   General: No apparent distress alert and oriented x3 mood and affect normal, dressed appropriately.  HEENT: Pupils equal, extraocular movements intact  Respiratory: Patient's speak in full sentences and does not appear short of breath  Cardiovascular: Trace lower extremity edema, non tender, no erythema  Gait antalgic Knee: Bilateral valgus deformity noted. Large thigh to calf ratio.  Tender to palpation over medial and PF joint line.  ROM full in flexion and extension and lower leg rotation. instability with valgus force.  painful patellar compression. Patellar glide with moderate crepitus. Patellar and quadriceps tendons unremarkable. Hamstring and quadriceps strength is normal.  After informed written and verbal consent, patient was seated on exam table. Right knee was prepped with alcohol swab and utilizing anterolateral approach, patient's right knee space was injected with 4:1  marcaine 0.5%: Kenalog 70m/dL. Patient tolerated the procedure well without immediate complications.  After informed written and verbal consent, patient was seated on exam table. Left knee was prepped with alcohol swab and utilizing anterolateral approach, patient's left knee space was injected with 4:1  marcaine 0.5%: Kenalog 454mdL. Patient tolerated the procedure well without immediate complications.    Impression and Recommendations:     The above documentation has been reviewed and is accurate and complete ZaLyndal PulleyDO

## 2020-10-10 DIAGNOSIS — H401131 Primary open-angle glaucoma, bilateral, mild stage: Secondary | ICD-10-CM | POA: Diagnosis not present

## 2020-10-12 ENCOUNTER — Ambulatory Visit (INDEPENDENT_AMBULATORY_CARE_PROVIDER_SITE_OTHER): Payer: Medicare Other

## 2020-10-12 ENCOUNTER — Encounter: Payer: Self-pay | Admitting: Family Medicine

## 2020-10-12 ENCOUNTER — Ambulatory Visit: Payer: Medicare Other | Admitting: Family Medicine

## 2020-10-12 ENCOUNTER — Other Ambulatory Visit: Payer: Self-pay

## 2020-10-12 VITALS — BP 132/82 | HR 57 | Ht 67.0 in | Wt 213.0 lb

## 2020-10-12 DIAGNOSIS — G8929 Other chronic pain: Secondary | ICD-10-CM | POA: Diagnosis not present

## 2020-10-12 DIAGNOSIS — M170 Bilateral primary osteoarthritis of knee: Secondary | ICD-10-CM | POA: Diagnosis not present

## 2020-10-12 DIAGNOSIS — M25561 Pain in right knee: Secondary | ICD-10-CM

## 2020-10-12 DIAGNOSIS — M25562 Pain in left knee: Secondary | ICD-10-CM

## 2020-10-12 NOTE — Patient Instructions (Addendum)
Good to see you Xray today Have fun on your trip Try the pennsaid Watch blood sugar for the next 3 weeks See me again in 6 weeks

## 2020-10-12 NOTE — Assessment & Plan Note (Signed)
Chronic, with exacerbation.  Has been over 3 years since we have done the injection.  Discussed icing regimen and home exercises.  Patient will be a potential candidate for viscosupplementation and we will try to get approval as well.  Patient will get new x-rays to further evaluate the amount of arthritis.  Patient wants to avoid all surgical intervention which I think is very possible.  Follow-up with me again 6 to 8 weeks

## 2020-10-19 DIAGNOSIS — U071 COVID-19: Secondary | ICD-10-CM | POA: Diagnosis not present

## 2020-10-23 ENCOUNTER — Other Ambulatory Visit: Payer: Self-pay | Admitting: Internal Medicine

## 2020-10-25 DIAGNOSIS — U071 COVID-19: Secondary | ICD-10-CM | POA: Diagnosis not present

## 2020-11-19 DIAGNOSIS — U071 COVID-19: Secondary | ICD-10-CM | POA: Diagnosis not present

## 2020-11-22 NOTE — Progress Notes (Signed)
Jade Sullivan Phone: [phone] Subjective:   [name], am serving as a scribe for Dr. Hulan Saas. This visit occurred during the SARS-CoV-2 public health emergency.  Safety protocols were in place, including screening questions prior to the visit, additional usage of staff PPE, and extensive cleaning of exam room while observing appropriate contact time as indicated for disinfecting solutions.   I'm seeing this patient by the request  of:  Hoyt Koch, MD  CC: Bilateral knee pain  [id number]  10/12/2020 Chronic, with exacerbation.  Has been over 3 years since we have done the injection.  Discussed icing regimen and home exercises.  Patient will be a potential candidate for viscosupplementation and we will try to get approval as well.  Patient will get new x-rays to further evaluate the amount of arthritis.  Patient wants to avoid all surgical intervention which I think is very possible.  Follow-up with me again 6 to 8 weeks  Update 11/23/2020 Jade Sullivan is a 75 y.o. female coming in with complaint of B knee pain. Durolane approved. Patient states that injections did help her pain last visit.  Patient does states that he continues to have pain on a daily basis.  Describes it as a dull, throbbing aching pain.  Continues to have some instability.     Past Medical History:  Diagnosis Date   Allergy    seasonal allergies   Arthritis    bilateral knees   Cataract    not a surgical candidate at this time (05/29/2020)   Chronic kidney disease    COVID-19 04/2019   Diabetes mellitus without complication ([place])    on meds   Diverticulosis of colon (without mention of hemorrhage)    Dysmetabolic syndrome X    Glaucoma    on meds   Gout    H/O hyperthyroidism    By patient h/o hyperthyroidism with protosis, s/p subtotal thyroidectomy. On no replacement therapy. Last thyroid functions 2013 in  [place]  Plan Will need thyroid functions Feb '15 with recommendations to follow.     Heart murmur    hx of   Hyperlipidemia    on meds   Hypertension    on multiple meds   Hypothyroidism    on meds   Kidney lesion    On supplemental oxygen by nasal cannula    at bedtime   Pancreatic cyst    Tubular adenoma of colon    Past Surgical History:  Procedure Laterality Date   BIOPSY  06/27/2020   Procedure: BIOPSY;  Surgeon: Jerene Bears, MD;  Location: [facility];  Service: Gastroenterology;;   [place]  2016   JMP-MAC-2 day suprep (good)-TICS/TA   COLONOSCOPY WITH PROPOFOL N/A 06/27/2020   Procedure: COLONOSCOPY WITH PROPOFOL;  Surgeon: Jerene Bears, MD;  Location: [facility];  Service: Gastroenterology;  Laterality: N/A;   EYE SURGERY     LIPOMA EXCISION Right 11/24/2014   Procedure: EXCISION RIGHT SHOULDER LIPOMA;  Surgeon: Erroll Luna, MD;  Location: [facility];  Service: General;  Laterality: Right;   POLYPECTOMY  2016   TA   POLYPECTOMY  06/27/2020   Procedure: POLYPECTOMY;  Surgeon: Jerene Bears, MD;  Location: [facility];  Service: Gastroenterology;;   THYROIDECTOMY, [place]   WISDOM TOOTH EXTRACTION     Social  History   Socioeconomic History   Marital status: Divorced    Spouse name: Not on file   Number of children: 1   Years of education: Not on file   Highest education level: Not on file  Occupational History   Occupation: Retired  Tobacco Use   Smoking status: Former   Smokeless tobacco: Never  Scientific laboratory technician Use: Never used  Substance and Sexual Activity   Alcohol use: No    Alcohol/week: 0.0 standard drinks   Drug use: No   Sexual activity: Not Currently    Comment: 1st intercourse- 37, partners- 29, divorced  Other Topics Concern   Not on file  Social History Narrative   Divorced   Retired - Engineer, maintenance (IT)   Social Determinants of  Radio broadcast assistant Strain: Low Risk    Difficulty of Paying Living Expenses: Not hard at all  Food Insecurity: No Food Insecurity   Worried About Charity fundraiser in the Last Year: Never true   Arboriculturist in the Last Year: Never true  Transportation Needs: No Transportation Needs   Lack of Transportation (Medical): No   Lack of Transportation (Non-Medical): No  Physical Activity: Insufficiently Active   Days of Exercise per Week: 7 days   Minutes of Exercise per Session: 20 min  Stress: No Stress Concern Present   Feeling of Stress : Not at all  Social Connections: Moderately Integrated   Frequency of Communication with Friends and Family: More than three times a week   Frequency of Social Gatherings with Friends and Family: Twice a week   Attends Religious Services: More than 4 times per year   Active Member of [employer] or Organizations: Yes   Attends Music therapist: More than 4 times per year   Marital Status: Divorced   Allergies  Allergen Reactions   Amlodipine     Gingival hyperplasia   Hctz [Hydrochlorothiazide] Other (See Comments)    gout   Family History  Problem Relation Age of Onset   Diabetes Mother        DM2   Hypertension Mother    Pneumonia Mother        died from this condition   Colon polyps Mother    Heart disease Father        died from this condition   Colon polyps Father    Kidney disease Sister        HBP and DM2; relative died from this condition   Colon polyps Sister    Heart disease Brother        MI - HBP and DM2   Colon polyps Brother    Colon polyps Brother    Colon cancer Neg Hx    Esophageal cancer Neg Hx    Rectal cancer Neg Hx    Stomach cancer Neg Hx     Current Outpatient Medications (Endocrine & Metabolic):    levothyroxine (SYNTHROID) 50 MCG tablet, TAKE 1 TABLET(50 MCG) BY MOUTH DAILY BEFORE BREAKFAST (Patient taking differently: Take 50 mcg by mouth daily before breakfast.)   metFORMIN  (GLUCOPHAGE) 500 MG tablet, Take 1 tablet (500 mg total) by mouth 2 (two) times daily with a meal.  Current Outpatient Medications (Cardiovascular):    atorvastatin (LIPITOR) 20 MG tablet, TAKE 1 TABLET(20 MG) BY MOUTH DAILY (Patient taking differently: Take 20 mg by mouth at bedtime.)   cloNIDine (CATAPRES - DOSED IN MG/24 HR) 0.3 mg/24hr patch, Place 0.3  mg onto the skin every Wednesday.   cloNIDine (CATAPRES) 0.2 MG tablet, Take 0.2 mg by mouth 3 (three) times daily.   irbesartan (AVAPRO) 300 MG tablet, TAKE 1 TABLET(300 MG) BY MOUTH IN THE MORNING   minoxidil (LONITEN) 2.5 MG tablet, Take 5 mg by mouth in the morning, at noon, and at bedtime.   nebivolol (BYSTOLIC) 5 MG tablet, Take 5 mg by mouth in the morning.   spironolactone (ALDACTONE) 100 MG tablet, Take 100 mg by mouth every evening.   Current Outpatient Medications (Analgesics):    aspirin EC 81 MG tablet, Take 81 mg by mouth every evening. Swallow whole.   aspirin-acetaminophen-caffeine (EXCEDRIN MIGRAINE) 250-250-65 MG tablet, Take 1-2 tablets by mouth every 6 (six) hours as needed for headache (sinus headaches/pain).  Current Outpatient Medications (Hematological):    vitamin B-12 (CYANOCOBALAMIN) 500 MCG tablet, Take 1,000 mcg by mouth every evening.  Current Outpatient Medications (Other):    cholecalciferol (VITAMIN D3) 25 MCG (1000 UT) tablet, Take 1,000 Units by mouth daily.   fish oil-omega-3 fatty acids 1000 MG capsule, Take 2 g by mouth daily.   latanoprost (XALATAN) 0.005 % ophthalmic solution, Place 1 drop into both eyes at bedtime.    Multiple Vitamin (MULTIVITAMIN WITH MINERALS) TABS tablet, Take 1 tablet by mouth every evening.   Omega-3 Fatty Acids (FISH OIL) 1000 MG CAPS, Take 1,000 mg by mouth at bedtime.   timolol (TIMOPTIC) 0.5 % ophthalmic solution, Place 1 drop into both eyes 2 (two) times daily.    Review of Systems:  No headache, visual changes, nausea, vomiting, diarrhea, constipation, dizziness,  abdominal pain, skin rash, fevers, chills, night sweats, weight loss, swollen lymph nodes,  chest pain, shortness of breath, mood changes. POSITIVE muscle aches, body aches, joint swelling  Objective  Blood pressure 122/90, pulse 63, height _0  (1.702 m), weight 209 lb (94.8 kg), SpO2 99 %.   General: No apparent distress alert and oriented x3 mood and affect normal, dressed appropriately.  HEENT: Pupils equal, extraocular movements intact  Respiratory: Patient's speak in full sentences and does not appear short of breath  Cardiovascular: No lower extremity edema, non tender, no erythema  Gait antalgic gait MSK: Knee: Bilateral valgus deformity noted. Large thigh to calf ratio.  Tender to palpation over medial and PF joint line.  ROM lacks last 5 degrees of extension in the last 5 degrees of flexion. instability with valgus force.  painful patellar compression. Patellar glide with moderate crepitus. Patellar and quadriceps tendons unremarkable. Hamstring and quadriceps strength is normal.  After informed written and verbal consent, patient was seated on exam table. Right knee was prepped with alcohol swab and utilizing anterolateral approach, patient's right knee space was injected with 60 mg per 3 mL of Durolane (sodium hyaluronate) in a prefilled syringe was injected easily into the knee through a 22-gauge needle..Patient tolerated the procedure well without immediate complications.  After informed written and verbal consent, patient was seated on exam table. Left knee was prepped with alcohol swab and utilizing anterolateral approach, patient's left knee space was injected with 60 mg per 3 mL of Durolane (sodium hyaluronate) in a prefilled syringe was injected easily into the knee through a 22-gauge needle..Patient tolerated the procedure well without immediate complications.    Impression and Recommendations:     The above documentation has been reviewed and is accurate and complete  Lyndal Pulley, DO

## 2020-11-23 ENCOUNTER — Ambulatory Visit: Payer: Medicare Other | Admitting: Family Medicine

## 2020-11-23 ENCOUNTER — Encounter: Payer: Self-pay | Admitting: Family Medicine

## 2020-11-23 ENCOUNTER — Other Ambulatory Visit: Payer: Self-pay

## 2020-11-23 DIAGNOSIS — M170 Bilateral primary osteoarthritis of knee: Secondary | ICD-10-CM | POA: Diagnosis not present

## 2020-11-23 NOTE — Assessment & Plan Note (Signed)
Chronic problem with exacerbation.  Discussed with patient about icing regimen and home exercises.  Discussed which activities to do which ones to avoid.  Discussed with patient that this may take some time.  Increase activity slowly.  Follow-up with me again in 2 months

## 2020-11-23 NOTE — Patient Instructions (Signed)
Gel injections today Start exercises on Monday If any signs of infection occur such as redness, swelling, heat, or pain, please seek medical attention or go into emergency room for further evaluation Have appt in 2 months just in case

## 2020-11-25 DIAGNOSIS — U071 COVID-19: Secondary | ICD-10-CM | POA: Diagnosis not present

## 2020-12-11 ENCOUNTER — Other Ambulatory Visit: Payer: Self-pay | Admitting: Internal Medicine

## 2020-12-11 DIAGNOSIS — Z1231 Encounter for screening mammogram for malignant neoplasm of breast: Secondary | ICD-10-CM

## 2020-12-13 DIAGNOSIS — N182 Chronic kidney disease, stage 2 (mild): Secondary | ICD-10-CM | POA: Diagnosis not present

## 2020-12-13 DIAGNOSIS — E782 Mixed hyperlipidemia: Secondary | ICD-10-CM | POA: Diagnosis not present

## 2020-12-13 DIAGNOSIS — I129 Hypertensive chronic kidney disease with stage 1 through stage 4 chronic kidney disease, or unspecified chronic kidney disease: Secondary | ICD-10-CM | POA: Diagnosis not present

## 2020-12-13 DIAGNOSIS — Z23 Encounter for immunization: Secondary | ICD-10-CM | POA: Diagnosis not present

## 2020-12-20 DIAGNOSIS — U071 COVID-19: Secondary | ICD-10-CM | POA: Diagnosis not present

## 2020-12-26 DIAGNOSIS — U071 COVID-19: Secondary | ICD-10-CM | POA: Diagnosis not present

## 2021-01-06 ENCOUNTER — Other Ambulatory Visit: Payer: Self-pay | Admitting: Internal Medicine

## 2021-01-11 ENCOUNTER — Telehealth: Payer: Self-pay

## 2021-01-11 NOTE — Progress Notes (Signed)
Chronic Care Management Pharmacy Assistant   Name: Jade Sullivan  MRN: [id number] DOB: Jan 24, 1946  Reason for Encounter: Disease State - Diabetes  Recent office visits:  08/14/20 Jade Sullivan (PCP) - Nocturnal hypoxia. Referral for Home Health. No med changes.   Recent consult visits:  11/23/20 Jade Sullivan (Sports Med) - Primary osteoarthritis of both knees.  10/12/20 [name] (Sports Med) - Chronic pain of both knees. No med changes.   Hospital visits:  None in previous 6 months  Medications: Outpatient Encounter Medications as of 01/11/2021  Medication Sig   aspirin EC 81 MG tablet Take 81 mg by mouth every evening. Swallow whole.   aspirin-acetaminophen-caffeine (EXCEDRIN MIGRAINE) 250-250-65 MG tablet Take 1-2 tablets by mouth every 6 (six) hours as needed for headache (sinus headaches/pain).   atorvastatin (LIPITOR) 20 MG tablet TAKE 1 TABLET(20 MG) BY MOUTH DAILY (Patient taking differently: Take 20 mg by mouth at bedtime.)   cholecalciferol (VITAMIN D3) 25 MCG (1000 UT) tablet Take 1,000 Units by mouth daily.   cloNIDine (CATAPRES - DOSED IN MG/24 HR) 0.3 mg/24hr patch Place 0.3 mg onto the skin every Wednesday.   cloNIDine (CATAPRES) 0.2 MG tablet Take 0.2 mg by mouth 3 (three) times daily.   fish oil-omega-3 fatty acids 1000 MG capsule Take 2 g by mouth daily.   irbesartan (AVAPRO) 300 MG tablet TAKE 1 TABLET(300 MG) BY MOUTH IN THE MORNING   latanoprost (XALATAN) 0.005 % ophthalmic solution Place 1 drop into both eyes at bedtime.    levothyroxine (SYNTHROID) 50 MCG tablet TAKE 1 TABLET(50 MCG) BY MOUTH DAILY BEFORE BREAKFAST (Patient taking differently: Take 50 mcg by mouth daily before breakfast.)   metFORMIN (GLUCOPHAGE) 500 MG tablet TAKE 1 TABLET(500 MG) BY MOUTH TWICE DAILY WITH A MEAL   minoxidil (LONITEN) 2.5 MG tablet Take 5 mg by mouth in the morning, at noon, and at bedtime.   Multiple Vitamin (MULTIVITAMIN WITH MINERALS) TABS tablet Take 1 tablet by mouth every evening.    nebivolol (BYSTOLIC) 5 MG tablet Take 5 mg by mouth in the morning.   Omega-3 Fatty Acids (FISH OIL) 1000 MG CAPS Take 1,000 mg by mouth at bedtime.   spironolactone (ALDACTONE) 100 MG tablet Take 100 mg by mouth every evening.   timolol (TIMOPTIC) 0.5 % ophthalmic solution Place 1 drop into both eyes 2 (two) times daily.   vitamin B-12 (CYANOCOBALAMIN) 500 MCG tablet Take 1,000 mcg by mouth every evening.   No facility-administered encounter medications on file as of 01/11/2021.   Recent Relevant Labs: Lab Results  Component Value Date/Time   HGBA1C 6.5 05/17/2020 10:50 AM   HGBA1C 9.8 (H) 04/14/2019 04:07 AM    Kidney Function Lab Results  Component Value Date/Time   CREATININE 1.00 05/17/2020 10:50 AM   CREATININE 0.99 08/25/2019 11:26 AM   GFR 55.51 (L) 05/17/2020 10:50 AM   GFRNONAA 44 (L) 05/03/2019 12:55 PM   GFRAA 51 (L) 05/03/2019 12:55 PM    Current antihyperglycemic regimen:   Metformin 500 mg BID  What recent interventions/DTPs have been made to improve glycemic control:  None noted  Have there been any recent hospitalizations or ED visits since last visit with CPP? No  Patient denies hypoglycemic symptoms, including None  Patient reports hyperglycemic symptoms, including none  How often are you checking your blood sugar?  3-4 times daily  What are your blood sugars ranging?  Before meals: 164 - 01/11/21  143 - 01/10/21  During the week, how often does your blood  glucose drop below 70?  Never  Are you checking your feet daily/regularly?  Patient states a representative from [facility] came out and checked her feet during the visit.   Adherence Review: Is the patient currently on a STATIN medication? No Is the patient currently on ACE/ARB medication? Yes Does the patient have >5 day gap between last estimated fill dates? No   Star Rating Drugs: Irbesartan - filled 10/24/20 90D Metformin - filled 01/02/21 90D  Patient states she has concerns about her  oxygen, she is waiting to hear back on someone calling to do a nocturnal pulse oximetry. Patient states the wrong home health was called and she haven't heard back. She also states she is in need of a provider to manage her diabetes.   [name], [place] Clinical Pharmacists Assistant [phone]

## 2021-01-12 ENCOUNTER — Other Ambulatory Visit: Payer: Self-pay

## 2021-01-12 ENCOUNTER — Ambulatory Visit
Admission: RE | Admit: 2021-01-12 | Discharge: 2021-01-12 | Disposition: A | Discharge status: Home / Self Care | Payer: Medicare Other | Source: Ambulatory Visit | Attending: Internal Medicine | Admitting: Internal Medicine

## 2021-01-12 DIAGNOSIS — Z1231 Encounter for screening mammogram for malignant neoplasm of breast: Secondary | ICD-10-CM | POA: Diagnosis not present

## 2021-01-19 DIAGNOSIS — U071 COVID-19: Secondary | ICD-10-CM | POA: Diagnosis not present

## 2021-01-22 NOTE — Progress Notes (Signed)
[place] Phone: [phone] Subjective:   [name], am serving as a scribe for Dr. Hulan Saas. This visit occurred during the SARS-CoV-2 public health emergency.  Safety protocols were in place, including screening questions prior to the visit, additional usage of staff PPE, and extensive cleaning of exam room while observing appropriate contact time as indicated for disinfecting solutions.   I'm seeing this patient by the request  of:  Hoyt Koch, MD  CC: Bilateral knee pain follow-up  SMO:[id number]  11/23/2020 Chronic problem with exacerbation.  Discussed with patient about icing regimen and home exercises.  Discussed which activities to do which ones to avoid.  Discussed with patient that this may take some time.  Increase activity slowly.  Follow-up with me again in 2 months  Update 01/23/2021 Jade Sullivan is a 75 y.o. female coming in with complaint of B knee pain. Gel given last visit. Patient states that her pain is off and on. Knees will get very sore within the knee joint. Feels stronger when walking. Will have incrase in pain when on feet for prolonged period.       Past Medical History:  Diagnosis Date   Allergy    seasonal allergies   Arthritis    bilateral knees   Cataract    not a surgical candidate at this time (05/29/2020)   Chronic kidney disease    COVID-19 04/2019   Diabetes mellitus without complication ([place])    on meds   Diverticulosis of colon (without mention of hemorrhage)    Dysmetabolic syndrome X    Glaucoma    on meds   Gout    H/O hyperthyroidism    By patient h/o hyperthyroidism with protosis, s/p subtotal thyroidectomy. On no replacement therapy. Last thyroid functions 2013 in [place]  Plan Will need thyroid functions Feb '15 with recommendations to follow.     Heart murmur    hx of   Hyperlipidemia    on meds   Hypertension    on multiple meds    Hypothyroidism    on meds   Kidney lesion    On supplemental oxygen by nasal cannula    at bedtime   Pancreatic cyst    Tubular adenoma of colon    Past Surgical History:  Procedure Laterality Date   BIOPSY  06/27/2020   Procedure: BIOPSY;  Surgeon: Jerene Bears, MD;  Location: [facility];  Service: Gastroenterology;;   [place]  2016   JMP-MAC-2 day suprep (good)-TICS/TA   COLONOSCOPY WITH PROPOFOL N/A 06/27/2020   Procedure: COLONOSCOPY WITH PROPOFOL;  Surgeon: Jerene Bears, MD;  Location: [facility];  Service: Gastroenterology;  Laterality: N/A;   EYE SURGERY     LIPOMA EXCISION Right 11/24/2014   Procedure: EXCISION RIGHT SHOULDER LIPOMA;  Surgeon: Erroll Luna, MD;  Location: [facility];  Service: General;  Laterality: Right;   POLYPECTOMY  2016   TA   POLYPECTOMY  06/27/2020   Procedure: POLYPECTOMY;  Surgeon: Jerene Bears, MD;  Location: [facility];  Service: Gastroenterology;;   THYROIDECTOMY, PARTIAL  1981   TONSILLECTOMY     UMBILICAL HERNIA REPAIR  2000   WISDOM TOOTH EXTRACTION     Social History   Socioeconomic History   Marital status: Divorced    Spouse name: Not on file   Number of children: 1   Years of education: Not on  file   Highest education level: Not on file  Occupational History   Occupation: Retired  Tobacco Use   Smoking status: Former   Smokeless tobacco: Never  Scientific laboratory technician Use: Never used  Substance and Sexual Activity   Alcohol use: No    Alcohol/week: 0.0 standard drinks   Drug use: No   Sexual activity: Not Currently    Comment: 1st intercourse- 16, partners- 109, divorced  Other Topics Concern   Not on file  Social History Narrative   Divorced   Retired - Engineer, maintenance (IT)   Social Determinants of Radio broadcast assistant Strain: Low Risk    Difficulty of Paying Living Expenses: Not hard at all  Food Insecurity: No Food Insecurity   Worried About Sales executive in the Last Year: Never true   Arboriculturist in the Last Year: Never true  Transportation Needs: No Transportation Needs   Lack of Transportation (Medical): No   Lack of Transportation (Non-Medical): No  Physical Activity: Insufficiently Active   Days of Exercise per Week: 7 days   Minutes of Exercise per Session: 20 min  Stress: No Stress Concern Present   Feeling of Stress : Not at all  Social Connections: Moderately Integrated   Frequency of Communication with Friends and Family: More than three times a week   Frequency of Social Gatherings with Friends and Family: Twice a week   Attends Religious Services: More than 4 times per year   Active Member of [employer] or Organizations: Yes   Attends Music therapist: More than 4 times per year   Marital Status: Divorced   Allergies  Allergen Reactions   Amlodipine     Gingival hyperplasia   Hctz [Hydrochlorothiazide] Other (See Comments)    gout   Family History  Problem Relation Age of Onset   Diabetes Mother        DM2   Hypertension Mother    Pneumonia Mother        died from this condition   Colon polyps Mother    Heart disease Father        died from this condition   Colon polyps Father    Kidney disease Sister        HBP and DM2; relative died from this condition   Colon polyps Sister    Heart disease Brother        MI - HBP and DM2   Colon polyps Brother    Colon polyps Brother    Colon cancer Neg Hx    Esophageal cancer Neg Hx    Rectal cancer Neg Hx    Stomach cancer Neg Hx     Current Outpatient Medications (Endocrine & Metabolic):    levothyroxine (SYNTHROID) 50 MCG tablet, TAKE 1 TABLET(50 MCG) BY MOUTH DAILY BEFORE BREAKFAST (Patient taking differently: Take 50 mcg by mouth daily before breakfast.)   metFORMIN (GLUCOPHAGE) 500 MG tablet, TAKE 1 TABLET(500 MG) BY MOUTH TWICE DAILY WITH A MEAL  Current Outpatient Medications (Cardiovascular):    atorvastatin (LIPITOR) 20 MG tablet, TAKE 1  TABLET(20 MG) BY MOUTH DAILY (Patient taking differently: Take 20 mg by mouth at bedtime.)   cloNIDine (CATAPRES - DOSED IN MG/24 HR) 0.3 mg/24hr patch, Place 0.3 mg onto the skin every Wednesday.   cloNIDine (CATAPRES) 0.2 MG tablet, Take 0.2 mg by mouth 3 (three) times daily.   irbesartan (AVAPRO) 300 MG tablet, TAKE 1 TABLET(300 MG) BY MOUTH  IN THE MORNING   minoxidil (LONITEN) 2.5 MG tablet, Take 5 mg by mouth in the morning, at noon, and at bedtime.   nebivolol (BYSTOLIC) 5 MG tablet, Take 5 mg by mouth in the morning.   spironolactone (ALDACTONE) 100 MG tablet, Take 100 mg by mouth every evening.   Current Outpatient Medications (Analgesics):    aspirin EC 81 MG tablet, Take 81 mg by mouth every evening. Swallow whole.   aspirin-acetaminophen-caffeine (EXCEDRIN MIGRAINE) 250-250-65 MG tablet, Take 1-2 tablets by mouth every 6 (six) hours as needed for headache (sinus headaches/pain).  Current Outpatient Medications (Hematological):    vitamin B-12 (CYANOCOBALAMIN) 500 MCG tablet, Take 1,000 mcg by mouth every evening.  Current Outpatient Medications (Other):    cholecalciferol (VITAMIN D3) 25 MCG (1000 UT) tablet, Take 1,000 Units by mouth daily.   Diclofenac Sodium 2 % SOLN, Place 2 g onto the skin 2 (two) times daily.   fish oil-omega-3 fatty acids 1000 MG capsule, Take 2 g by mouth daily.   latanoprost (XALATAN) 0.005 % ophthalmic solution, Place 1 drop into both eyes at bedtime.    Multiple Vitamin (MULTIVITAMIN WITH MINERALS) TABS tablet, Take 1 tablet by mouth every evening.   Omega-3 Fatty Acids (FISH OIL) 1000 MG CAPS, Take 1,000 mg by mouth at bedtime.   timolol (TIMOPTIC) 0.5 % ophthalmic solution, Place 1 drop into both eyes 2 (two) times daily.   Reviewed prior external information including notes and imaging from  primary care provider As well as notes that were available from care everywhere and other healthcare systems.  Past medical history, social, surgical and  family history all reviewed in electronic medical record.  No pertanent information unless stated regarding to the chief complaint.   Review of Systems:  No headache, visual changes, nausea, vomiting, diarrhea, constipation, dizziness, abdominal pain, skin rash, fevers, chills, night sweats, weight loss, swollen lymph nodes, body aches, joint swelling, chest pain, shortness of breath, mood changes. POSITIVE muscle aches  Objective  Blood pressure 132/88, pulse 73, height _0  (1.702 m), weight 206 lb (93.4 kg), SpO2 98 %.   General: No apparent distress alert and oriented x3 mood and affect normal, dressed appropriately.  HEENT: Pupils equal, extraocular movements intact  Respiratory: Patient's speak in full sentences and does not appear short of breath  Cardiovascular: No lower extremity edema, non tender, no erythema  Gait antalgic MSK: Knee exam show the patient still has tenderness to palpation over the medial joint line.  Instability noted with valgus and varus force.  Trace effusion noted of the right patellofemoral joint.  Patient lacks last 2 degrees of extension bilaterally in the last 5 degrees of flexion only on the right    Impression and Recommendations:    The above documentation has been reviewed and is accurate and complete Lyndal Pulley, DO

## 2021-01-23 ENCOUNTER — Encounter: Payer: Self-pay | Admitting: Family Medicine

## 2021-01-23 ENCOUNTER — Ambulatory Visit: Payer: Medicare Other | Admitting: Family Medicine

## 2021-01-23 ENCOUNTER — Other Ambulatory Visit: Payer: Self-pay

## 2021-01-23 DIAGNOSIS — M170 Bilateral primary osteoarthritis of knee: Secondary | ICD-10-CM

## 2021-01-23 MED ORDER — DICLOFENAC SODIUM 2 % EX SOLN: 2.0000 g | Freq: Two times a day (BID) | CUTANEOUS | 3 refills | Status: DC

## 2021-01-23 NOTE — Progress Notes (Signed)
Patient stated that an agency called her and asked if she uses oxygen all the time and she stated she only uses it at night. She was told someone would call her back and she have not heard anything since then.  [name], [place] Clinical Pharmacists Assistant [phone]

## 2021-01-23 NOTE — Patient Instructions (Signed)
Stay active Ice at end of long day See me in 2 months just in case

## 2021-01-23 NOTE — Assessment & Plan Note (Signed)
Patient has responded well to the viscosupplementation at this time.  Discussed icing regimen and home exercises, which activities to do and which ones to avoid.  Patient did respond well to the viscosupplementation we can consider this every 6 months if needed and also do interval injections of the steroids if necessary.  Patient will have a follow-up again in 2 months for further evaluation.

## 2021-01-24 NOTE — Telephone Encounter (Addendum)
Patient was supposed to have home health come out and repeat nocturnal pulse oximetry to see if she still needs nocturnal O2 or not. She says she received a call from an agency a while ago and was told they would call her back and she hasn't heard anything since. She would still like to get repeat testing done.  Per chart review Home Health referral for testing was ordered in May, on 09/11/20 [facility] agreed to see patient.

## 2021-01-25 ENCOUNTER — Other Ambulatory Visit: Payer: Self-pay | Admitting: Internal Medicine

## 2021-01-25 DIAGNOSIS — U071 COVID-19: Secondary | ICD-10-CM | POA: Diagnosis not present

## 2021-01-30 ENCOUNTER — Other Ambulatory Visit: Payer: Self-pay | Admitting: Internal Medicine

## 2021-02-14 ENCOUNTER — Other Ambulatory Visit: Payer: Self-pay

## 2021-02-14 ENCOUNTER — Encounter: Payer: Self-pay | Admitting: Internal Medicine

## 2021-02-14 ENCOUNTER — Ambulatory Visit (INDEPENDENT_AMBULATORY_CARE_PROVIDER_SITE_OTHER): Payer: Medicare Other | Admitting: Internal Medicine

## 2021-02-14 VITALS — BP 130/70 | HR 76 | Resp 18 | Ht 67.0 in | Wt 190.6 lb

## 2021-02-14 DIAGNOSIS — I129 Hypertensive chronic kidney disease with stage 1 through stage 4 chronic kidney disease, or unspecified chronic kidney disease: Secondary | ICD-10-CM

## 2021-02-14 DIAGNOSIS — I700 Atherosclerosis of aorta: Secondary | ICD-10-CM | POA: Diagnosis not present

## 2021-02-14 DIAGNOSIS — E1122 Type 2 diabetes mellitus with diabetic chronic kidney disease: Secondary | ICD-10-CM

## 2021-02-14 DIAGNOSIS — N1831 Chronic kidney disease, stage 3a: Secondary | ICD-10-CM | POA: Diagnosis not present

## 2021-02-14 LAB — COMPREHENSIVE METABOLIC PANEL
ALT: 7 U/L (ref 0–35)
AST: 13 U/L (ref 0–37)
Albumin: 4.1 g/dL (ref 3.5–5.2)
Alkaline Phosphatase: 72 U/L (ref 39–117)
BUN: 13 mg/dL (ref 6–23)
CO2: 22 mEq/L (ref 19–32)
Calcium: 9.5 mg/dL (ref 8.4–10.5)
Chloride: 112 mEq/L (ref 96–112)
Creatinine, Ser: 0.98 mg/dL (ref 0.40–1.20)
GFR: 56.57 mL/min — ABNORMAL LOW (ref >60.00)
Glucose, Bld: 132 mg/dL — ABNORMAL HIGH (ref 70–99)
Potassium: 4.1 mEq/L (ref 3.5–5.1)
Sodium: 143 mEq/L (ref 135–145)
Total Bilirubin: 0.7 mg/dL (ref 0.2–1.2)
Total Protein: 7 g/dL (ref 6.0–8.3)

## 2021-02-14 LAB — CBC
HCT: 38.6 % (ref 36.0–46.0)
Hemoglobin: 12.8 g/dL (ref 12.0–15.0)
MCHC: 33.2 g/dL (ref 30.0–36.0)
MCV: 90.8 fl (ref 78.0–100.0)
Platelets: 186 10*3/uL (ref 150.0–400.0)
RBC: 4.25 Mil/uL (ref 3.87–5.11)
RDW: 15 % (ref 11.5–15.5)
WBC: 5.2 10*3/uL (ref 4.0–10.5)

## 2021-02-14 LAB — POCT GLYCOSYLATED HEMOGLOBIN (HGB A1C): Hemoglobin A1C: 6.1 % — AB (ref 4.0–5.6)

## 2021-02-14 MED ORDER — ALPRAZOLAM 0.25 MG PO TABS: 0.2500 mg | ORAL_TABLET | Freq: Two times a day (BID) | ORAL | 0 refills | Status: AC | PRN

## 2021-02-14 NOTE — Progress Notes (Signed)
   Subjective:   Patient ID: Jade Sullivan, female    DOB: Apr 29, 1945, 75 y.o.   MRN: [id number]  HPI The patient is a 75 YO female coming in for follow up.  Review of Systems  Constitutional: Negative.   HENT: Negative.    Eyes: Negative.   Respiratory:  Negative for cough, chest tightness and shortness of breath.   Cardiovascular:  Negative for chest pain, palpitations and leg swelling.  Gastrointestinal:  Negative for abdominal distention, abdominal pain, constipation, diarrhea, nausea and vomiting.  Musculoskeletal: Negative.   Skin: Negative.   Neurological: Negative.   Psychiatric/Behavioral: Negative.     Objective:  Physical Exam Constitutional:      Appearance: She is well-developed.  HENT:     Head: Normocephalic and atraumatic.  Cardiovascular:     Rate and Rhythm: Normal rate and regular rhythm.  Pulmonary:     Effort: Pulmonary effort is normal. No respiratory distress.     Breath sounds: Normal breath sounds. No wheezing or rales.  Abdominal:     General: Bowel sounds are normal. There is no distension.     Palpations: Abdomen is soft.     Tenderness: There is no abdominal tenderness. There is no rebound.  Musculoskeletal:     Cervical back: Normal range of motion.  Skin:    General: Skin is warm and dry.  Neurological:     Mental Status: She is alert and oriented to person, place, and time.     Coordination: Coordination normal.    Vitals:   02/14/21 1058  BP: 130/70  Pulse: 76  Resp: 18  SpO2: 94%  Weight: 190 lb 9.6 oz (86.5 kg)  Height: 5\' 7"  (1.702 m)    This visit occurred during the SARS-CoV-2 public health emergency.  Safety protocols were in place, including screening questions prior to the visit, additional usage of staff PPE, and extensive cleaning of exam room while observing appropriate contact time as indicated for disinfecting solutions.   Assessment & Plan:

## 2021-02-14 NOTE — Patient Instructions (Addendum)
We have sent in xanax to take before the dentist.   The sugars are doing well today the HgA1c is 6.1 so stop taking the metformin and come back in 3 months.   We are checking the labs today.

## 2021-02-17 NOTE — Assessment & Plan Note (Signed)
Checking CMP for stability.  

## 2021-02-17 NOTE — Assessment & Plan Note (Addendum)
Wishes to see cardiology as she is feeling like she may need assessment for blockages. No current chest pains. Taking lipitor 20 mg daily.

## 2021-02-17 NOTE — Assessment & Plan Note (Signed)
Checking CMP and CBC today and adjust as needed. BP at goal.

## 2021-02-17 NOTE — Assessment & Plan Note (Addendum)
POC HgA1c done and 6.1 so she will stop the metformin and come back in 3 months for recheck. She has made changes to diet as well. Referral to podiatry for foot care.

## 2021-02-19 DIAGNOSIS — U071 COVID-19: Secondary | ICD-10-CM | POA: Diagnosis not present

## 2021-02-25 DIAGNOSIS — U071 COVID-19: Secondary | ICD-10-CM | POA: Diagnosis not present

## 2021-03-01 ENCOUNTER — Other Ambulatory Visit: Payer: Self-pay | Admitting: Internal Medicine

## 2021-03-09 NOTE — Progress Notes (Addendum)
Cardiology Office Note:    Date:  03/12/2021   ID:  Jade Sullivan, DOB January 14, 1946, MRN [id number]  PCP:  Hoyt Koch, MD   [facility] Providers Cardiologist:  Lenna Sciara, MD Referring MD: Hoyt Koch, *   Chief Complaint/Reason for Referral:  Aortic atherosclerosis  ASSESSMENT & PLAN:    Aortic atherosclerosis ([place]) Continued risk factor modification with LDL goal less than 70.  We will increase atorvastatin to 40 mg and continue ASA 81mg .  Blood pressure is elevated today but she tells me that it is well controlled at home and when she saw her last PCP her blood pressure is well controlled.  I will have her return for blood pressure check in a month.  Follow-up 6 months or earlier if needed    Type 2 diabetes mellitus without complication, without long-term current use of insulin (HCC) Continue aspirin, statin, and ARB.  Start Jardiance 10 mg daily  Hyperlipidemia, unspecified hyperlipidemia type Increase atorvastatin to 40 mg.  Her PCP will be checking her LDL in February.  Her goal LDL is less than 70 and as low as can be achieved.  Hypertension, unspecified type Patient will return for blood pressure check in 1 month.  Her blood pressure is normally well controlled.  If not we can increase her Bystolic.  If her blood pressure remains consistently high then an evaluation for secondary causes of hypertension including renal ultrasound may be in order.  Repeat BP assessment at the end of the visit was 180/60mmHg.  Dispo:  No follow-ups on file.     Medication Adjustments/Labs and Tests Ordered: Current medicines are reviewed at length with the patient today.  Concerns regarding medicines are outlined above.   Tests Ordered: Orders Placed This Encounter  Procedures   EKG 12-Lead     Medication Changes: Meds ordered this encounter  Medications   atorvastatin (LIPITOR) 40 MG tablet    Sig: Take 1 tablet (40 mg total) by mouth daily.     Dispense:  90 tablet    Refill:  3   empagliflozin (JARDIANCE) 10 MG TABS tablet    Sig: Take 1 tablet (10 mg total) by mouth daily before breakfast.    Dispense:  90 tablet    Refill:  3     History of Present Illness:    FOCUSED CARDIOVASCULAR PROBLEM LIST:   1.  Aortic calcification but no coronary artery calcification seen on CT 2021 2.  Type 2 diabetes 3.  Hypertension 4.  Hyperlipidemia  The patient is a 75 y.o. female with the indicated medical history here for recommendations regarding aortic calcification.  Patient was recently seen by her primary care provider and was doing well.  She had no reports of chest pain or exertional dyspnea.  She was referred to cardiology for recommendations regarding calcification seen on her aorta on a noncontrast CT some years ago.    The patient today is doing well.  She is anxious today here in the cardiology office to establish care.  She did have an admission for [facility] in 2021.  Prior to this she was working out at [employer] without any issues.  She has been avoiding the gym because of COVID issues and has been working out at home on a stationary bicycle.  She denies any exertional angina, dyspnea, presyncope, syncope, palpitations, paroxysmal nocturnal dyspnea, orthopnea.  She has not required any hospitalizations or emergency room visits recently.  She is otherwise well and without complaints.  Of note she does check her blood pressures at home and they are normally well controlled in the 130s over 70s to 80s.  Her last PCP appointment demonstrated a well-controlled blood pressure as well.     Previous Medical History: Past Medical History:  Diagnosis Date   Allergy    seasonal allergies   Arthritis    bilateral knees   Cataract    not a surgical candidate at this time (05/29/2020)   Chronic kidney disease    COVID-19 04/2019   Diabetes mellitus without complication ([place])    on meds   Diverticulosis of colon (without mention of  hemorrhage)    Dysmetabolic syndrome X    Glaucoma    on meds   Gout    H/O hyperthyroidism    By patient h/o hyperthyroidism with protosis, s/p subtotal thyroidectomy. On no replacement therapy. Last thyroid functions 2013 in [place]  Plan Will need thyroid functions Feb '15 with recommendations to follow.     Heart murmur    hx of   Hyperlipidemia    on meds   Hypertension    on multiple meds   Hypothyroidism    on meds   Kidney lesion    On supplemental oxygen by nasal cannula    at bedtime   Pancreatic cyst    Tubular adenoma of colon      Current Medications: Current Meds  Medication Sig   ALPRAZolam (XANAX) 0.25 MG tablet Take 1 tablet (0.25 mg total) by mouth 2 (two) times daily as needed for anxiety (prior to dentist).   aspirin EC 81 MG tablet Take 81 mg by mouth every evening. Swallow whole.   aspirin-acetaminophen-caffeine (EXCEDRIN MIGRAINE) 250-250-65 MG tablet Take 1-2 tablets by mouth every 6 (six) hours as needed for headache (sinus headaches/pain).   atorvastatin (LIPITOR) 40 MG tablet Take 1 tablet (40 mg total) by mouth daily.   cholecalciferol (VITAMIN D3) 25 MCG (1000 UT) tablet Take 1,000 Units by mouth daily.   cloNIDine (CATAPRES - DOSED IN MG/24 HR) 0.3 mg/24hr patch Place 0.3 mg onto the skin every Wednesday.   cloNIDine (CATAPRES) 0.2 MG tablet Take 0.2 mg by mouth 3 (three) times daily.   empagliflozin (JARDIANCE) 10 MG TABS tablet Take 1 tablet (10 mg total) by mouth daily before breakfast.   fish oil-omega-3 fatty acids 1000 MG capsule Take 2 g by mouth daily.   irbesartan (AVAPRO) 300 MG tablet TAKE 1 TABLET(300 MG) BY MOUTH IN THE MORNING   latanoprost (XALATAN) 0.005 % ophthalmic solution Place 1 drop into both eyes at bedtime.    levothyroxine (SYNTHROID) 50 MCG tablet TAKE 1 TABLET(50 MCG) BY MOUTH DAILY BEFORE BREAKFAST (Patient taking differently: Take 50 mcg by mouth daily before breakfast.)   minoxidil (LONITEN) 2.5 MG tablet Take 5 mg by  mouth in the morning, at noon, and at bedtime.   Multiple Vitamin (MULTIVITAMIN WITH MINERALS) TABS tablet Take 1 tablet by mouth every evening.   nebivolol (BYSTOLIC) 5 MG tablet Take 5 mg by mouth in the morning.   Omega-3 Fatty Acids (FISH OIL) 1000 MG CAPS Take 1,000 mg by mouth at bedtime.   spironolactone (ALDACTONE) 100 MG tablet Take 100 mg by mouth every evening.   timolol (TIMOPTIC) 0.5 % ophthalmic solution Place 1 drop into both eyes 2 (two) times daily.   vitamin B-12 (CYANOCOBALAMIN) 500 MCG tablet Take 1,000 mcg by mouth every evening.   [DISCONTINUED] atorvastatin (LIPITOR) 20 MG tablet TAKE 1 TABLET(20 MG) BY  MOUTH DAILY   [DISCONTINUED] Diclofenac Sodium 2 % SOLN Place 2 g onto the skin 2 (two) times daily.     Allergies:    Amlodipine and Hctz [hydrochlorothiazide]   Social History:   Social History   Tobacco Use   Smoking status: Former   Smokeless tobacco: Never  Scientific laboratory technician Use: Never used  Substance Use Topics   Alcohol use: No    Alcohol/week: 0.0 standard drinks   Drug use: No     Family Hx: Family History  Problem Relation Age of Onset   Diabetes Mother        DM2   Hypertension Mother    Pneumonia Mother        died from this condition   Colon polyps Mother    Heart disease Father        died from this condition   Colon polyps Father    Kidney disease Sister        HBP and DM2; relative died from this condition   Colon polyps Sister    Heart disease Brother        MI - HBP and DM2   Colon polyps Brother    Colon polyps Brother    Colon cancer Neg Hx    Esophageal cancer Neg Hx    Rectal cancer Neg Hx    Stomach cancer Neg Hx      Review of Systems:   Please see the history of present illness.    All other systems reviewed and are negative.  EKGs/Labs/Other Test Reviewed:    EKG:  EKG today: Sinus rhythm with left axis deviation prior EKG: Normal sinus rhythm  Prior CV studies:  ECHO COMPLETE WO IMAGING ENHANCING AGENT  AND WITH 3D 05/13/2019  Narrative ECHOCARDIOGRAM REPORT    Patient Name:   ANSHI JALLOH Date of Exam: 05/13/2019 Medical Rec #:  [id number]       Height:       65.0 in Accession #:    [id number]      Weight:       210.0 lb Date of Birth:  01/26/1946       BSA:          2.02 m Patient Age:    89 years        BP:           122/84 mmHg Patient Gender: F               HR:           87 bpm. Exam Location:  [place]  Procedure: 2D Echo, 3D Echo, Cardiac Doppler, Color Doppler and Strain Analysis  Indications:    I31.3 Pericardial effusion  History:        Patient has prior history of Echocardiogram examinations, most recent 04/16/2019. Pericardial Disease; Risk Factors:Hypertension, Dyslipidemia, Former Smoker and Obesity. COVID-19+. CKD stage 3.  Sonographer:    Jessee Avers, RDCS Referring Phys: [place]   1. Normal LV systolic function; moderate LVH; grade 1 diastolic dysfunction; mildly dilated ascending aorta; mild AI and MR; small pericardial effusion; compared to 04/16/19, pericardial effusion slightly smaller. 2. Left ventricular ejection fraction, by estimation, is 60 to 65%. The left ventricle has normal function. The left ventrical has no regional wall motion abnormalities. There is moderately increased left ventricular hypertrophy. Left ventricular diastolic parameters are consistent with Grade I diastolic dysfunction (impaired relaxation). Elevated left atrial pressure. 3.  Right ventricular systolic function is normal. The right ventricular size is normal. There is mildly elevated pulmonary artery systolic pressure. 4. The mitral valve is normal in structure and function. Mild mitral valve regurgitation. No evidence of mitral stenosis. 5. The aortic valve is tricuspid. Aortic valve regurgitation is mild. Mild aortic valve sclerosis is present, with no evidence of aortic valve stenosis. 6. Aortic dilatation noted. There is mild dilatation  of the ascending aorta measuring 41 mm. 7. The inferior vena cava is normal in size with greater than 50% respiratory variability, suggesting right atrial pressure of 3 mmHg.  Comparison(s): 04/16/19 EF 55-60%. Moderate pericardial effusion.  FINDINGS Left Ventricle: Left ventricular ejection fraction, by estimation, is 60 to 65%. The left ventricle has normal function. The left ventricle has no regional wall motion abnormalities. The left ventricular internal cavity size was normal in size. There is moderately increased left ventricular hypertrophy. Left ventricular diastolic parameters are consistent with Grade I diastolic dysfunction (impaired relaxation).  Right Ventricle: The right ventricular size is normal. Right ventricular systolic function is normal. There is mildly elevated pulmonary artery systolic pressure. The tricuspid regurgitant velocity is 2.76 m/s, and with an assumed right atrial pressure of 3 mmHg, the estimated right ventricular systolic pressure is 29.5 mmHg.  Left Atrium: Left atrial size was normal in size.  Right Atrium: Right atrial size was normal in size.  Pericardium: A small pericardial effusion is present.  Mitral Valve: The mitral valve is normal in structure and function. Normal mobility of the mitral valve leaflets. Mild mitral valve regurgitation. No evidence of mitral valve stenosis.  Tricuspid Valve: The tricuspid valve is normal in structure. Tricuspid valve regurgitation is trivial. No evidence of tricuspid stenosis.  Aortic Valve: The aortic valve is tricuspid. Aortic valve regurgitation is mild. Aortic regurgitation PHT measures 403 msec. Mild aortic valve sclerosis is present, with no evidence of aortic valve stenosis.  Pulmonic Valve: The pulmonic valve was normal in structure. Pulmonic valve regurgitation is mild. No evidence of pulmonic stenosis.  Aorta: Aortic dilatation noted. There is mild dilatation of the ascending aorta measuring 41  mm.  Venous: The inferior vena cava is normal in size with greater than 50% respiratory variability, suggesting right atrial pressure of 3 mmHg. The inferior vena cava and the hepatic vein show a normal flow pattern.  IAS/Shunts: No atrial level shunt detected by color flow Doppler.  Additional Comments: Normal LV systolic function; moderate LVH; grade 1 diastolic dysfunction; mildly dilated ascending aorta; mild AI and MR; small pericardial effusion; compared to 04/16/19, pericardial effusion slightly smaller.   LEFT VENTRICLE PLAX 2D LVIDd:         4.50 cm  Diastology LVIDs:         3.10 cm  LV e' lateral:   5.11 cm/s LV PW:         1.60 cm  LV E/e' lateral: 13.9 LV IVS:        1.50 cm  LV e' medial:    3.48 cm/s LVOT diam:     2.00 cm  LV E/e' medial:  20.4 LV SV:         65.97 ml LV SV Index:   25.60    2D Longitudinal Strain LVOT Area:     3.14 cm 2D Strain GLS (A2C):   -14.1 % 2D Strain GLS (A3C):   -17.7 % 2D Strain GLS (A4C):   -12.6 % 2D Strain GLS Avg:     -14.8 %  3D Volume EF:  3D EF:        61 % LV EDV:       165 ml LV ESV:       65 ml LV SV:        100 ml  RIGHT VENTRICLE RV Basal diam:  3.00 cm RV S prime:     9.79 cm/s TAPSE (M-mode): 1.4 cm RVSP:           38.5 mmHg  LEFT ATRIUM             Index       RIGHT ATRIUM           Index LA diam:        4.40 cm 2.18 cm/m  RA Pressure: 8.00 mmHg LA Vol (A2C):   60.8 ml 30.11 ml/m RA Area:     10.00 cm LA Vol (A4C):   63.1 ml 31.24 ml/m RA Volume:   15.60 ml  7.72 ml/m LA Biplane Vol: 66.1 ml 32.73 ml/m AORTIC VALVE LVOT Vmax:   104.00 cm/s LVOT Vmean:  80.300 cm/s LVOT VTI:    0.210 m AI PHT:      403 msec  AORTA Ao Root diam: 4.10 cm Ao Asc diam:  4.10 cm  MV E velocity: 71.00 cm/s 103 cm/s  TRICUSPID VALVE MV A velocity: 97.90 cm/s 70.3 cm/s TR Peak grad:   30.5 mmHg MV E/A ratio:  0.73       1.5       TR Vmax:        276.00 cm/s Estimated RAP:  8.00 mmHg RVSP:           38.5  mmHg  SHUNTS Systemic VTI:  0.21 m Systemic Diam: 2.00 cm  Kirk Ruths MD Electronically signed by Kirk Ruths MD Signature Date/Time: 05/13/2019/2:08:38 PM    Final     Imaging studies that I have independently reviewed today: Echo, CT  Recent Labs: 02/14/2021: ALT 7; BUN 13; Creatinine, Ser 0.98; Hemoglobin 12.8; Platelets 186.0; Potassium 4.1; Sodium 143   Recent Lipid Panel Lab Results  Component Value Date/Time   CHOL 146 05/17/2020 10:50 AM   TRIG 105.0 05/17/2020 10:50 AM   HDL 38.00 (L) 05/17/2020 10:50 AM   LDLCALC 87 05/17/2020 10:50 AM    Risk Assessment/Calculations:           Physical Exam:    VS:  BP (!) 200/98 (BP Location: Left Arm, Patient Position: Sitting, Cuff Size: Normal)   Pulse 70   Ht 5\' 5"  (1.651 m)   Wt 204 lb (92.5 kg)   BMI 33.95 kg/m    Wt Readings from Last 3 Encounters:  03/12/21 204 lb (92.5 kg)  02/14/21 190 lb 9.6 oz (86.5 kg)  01/23/21 206 lb (93.4 kg)    GENERAL:  No apparent distress, AOx3 HEENT:  No carotid bruits, +2 carotid impulses, no scleral icterus CAR: RRR, no murmurs, gallops, rubs, or thrills RES:  Clear to auscultation bilaterally ABD:  Soft, nontender, nondistended, positive bowel sounds x 4 VASC:  +2 radial pulses, +2 carotid pulses, palpable pedal pulses NEURO:  CN 2-12 grossly intact; motor and sensory grossly intact PSYCH:  No active depression or anxiety EXT:  No edema, ecchymosis, or cyanosis  Signed, Early Osmond, MD  03/12/2021 9:52 AM    [address], [place], [place]  [address] Phone: [phone]; Fax: [phone]

## 2021-03-12 ENCOUNTER — Encounter: Payer: Self-pay | Admitting: Internal Medicine

## 2021-03-12 ENCOUNTER — Ambulatory Visit: Payer: Medicare Other | Admitting: Internal Medicine

## 2021-03-12 ENCOUNTER — Other Ambulatory Visit: Payer: Self-pay

## 2021-03-12 VITALS — BP 200/98 | HR 70 | Ht 65.0 in | Wt 204.0 lb

## 2021-03-12 DIAGNOSIS — E119 Type 2 diabetes mellitus without complications: Secondary | ICD-10-CM

## 2021-03-12 DIAGNOSIS — E785 Hyperlipidemia, unspecified: Secondary | ICD-10-CM

## 2021-03-12 DIAGNOSIS — I700 Atherosclerosis of aorta: Secondary | ICD-10-CM | POA: Diagnosis not present

## 2021-03-12 DIAGNOSIS — I10 Essential (primary) hypertension: Secondary | ICD-10-CM | POA: Diagnosis not present

## 2021-03-12 MED ORDER — ATORVASTATIN CALCIUM 40 MG PO TABS: 40.0000 mg | ORAL_TABLET | Freq: Every day | ORAL | 3 refills | Status: DC

## 2021-03-12 MED ORDER — EMPAGLIFLOZIN 10 MG PO TABS: 10.0000 mg | ORAL_TABLET | Freq: Every day | ORAL | 3 refills | Status: DC

## 2021-03-12 NOTE — Patient Instructions (Addendum)
Medication Instructions:  Your physician has recommended you make the following change in your medication:  1.) increase atorvastatin (Lipitor) to 40 mg - take one tablet daily 2.) start Jardiance 10 mg - take one tablet daily  *If you need a refill on your cardiac medications before your next appointment, please call your pharmacy*   Lab Work: none  Testing/Procedures: none   Follow-Up: At [facility], you and your health needs are our priority.  As part of our continuing mission to provide you with exceptional heart care, we have created designated Provider Care Teams.  These Care Teams include your primary Cardiologist (physician) and Advanced Practice Providers (APPs -  Physician Assistants and Nurse Practitioners) who all work together to provide you with the care you need, when you need it.     Your next appointment:   6 month(s)  The format for your next appointment:   In Person  Provider:   Early Osmond, MD    Other Instructions Please return for BP check in one month. - Jan 12 at 11:00 am

## 2021-03-19 NOTE — Progress Notes (Signed)
Jade Sullivan Phone: [phone] Subjective:   Jade Sullivan, am serving as a scribe for Dr. Hulan Saas.This visit occurred during the SARS-CoV-2 public health emergency.  Safety protocols were in place, including screening questions prior to the visit, additional usage of staff PPE, and extensive cleaning of exam room while observing appropriate contact time as indicated for disinfecting solutions. 198lb I'm seeing this patient by the request  of:  Hoyt Koch, MD  CC: Bilateral knee pain  TXH:[id number]  01/23/2021 Patient has responded well to the viscosupplementation at this time.  Discussed icing regimen and home exercises, which activities to do and which ones to avoid.  Patient did respond well to the viscosupplementation we can consider this every 6 months if needed and also do interval injections of the steroids if necessary.  Patient will have a follow-up again in 2 months for further evaluation.  Update 03/20/2021 Jade Sullivan is a 75 y.o. female coming in with complaint of B knee pain. Durolane injection 11/23/2020.  Patient has responded fairly well to the viscosupplementation previously.  Patient states that she has been having pain in posterior aspect of both legs R>L right before she gets out of bed. Does feels like gel injection has helped. Pain and stiffness are less than usual.  Overall patient is doing relatively well.  Feels like she is still doing much better than where she used to be.  Patient is also working on her weight which she also thinks is beneficial.       Past Medical History:  Diagnosis Date   Allergy    seasonal allergies   Arthritis    bilateral knees   Cataract    not a surgical candidate at this time (05/29/2020)   Chronic kidney disease    COVID-19 04/2019   Diabetes mellitus without complication ([place])    on meds   Diverticulosis of colon (without mention of  hemorrhage)    Dysmetabolic syndrome X    Glaucoma    on meds   Gout    H/O hyperthyroidism    By patient h/o hyperthyroidism with protosis, s/p subtotal thyroidectomy. On Sullivan replacement therapy. Last thyroid functions 2013 in [place]  Plan Will need thyroid functions Feb '15 with recommendations to follow.     Heart murmur    hx of   Hyperlipidemia    on meds   Hypertension    on multiple meds   Hypothyroidism    on meds   Kidney lesion    On supplemental oxygen by nasal cannula    at bedtime   Pancreatic cyst    Tubular adenoma of colon    Past Surgical History:  Procedure Laterality Date   BIOPSY  06/27/2020   Procedure: BIOPSY;  Surgeon: Jerene Bears, MD;  Location: [facility];  Service: Gastroenterology;;   [place]  2016   JMP-MAC-2 day suprep (good)-TICS/TA   COLONOSCOPY WITH PROPOFOL N/A 06/27/2020   Procedure: COLONOSCOPY WITH PROPOFOL;  Surgeon: Jerene Bears, MD;  Location: [facility];  Service: Gastroenterology;  Laterality: N/A;   EYE SURGERY     LIPOMA EXCISION Right 11/24/2014   Procedure: EXCISION RIGHT SHOULDER LIPOMA;  Surgeon: Erroll Luna, MD;  Location: [facility];  Service: General;  Laterality: Right;   POLYPECTOMY  2016   TA   POLYPECTOMY  06/27/2020   Procedure: POLYPECTOMY;  Surgeon: Jerene Bears, MD;  Location: [facility];  Service: Gastroenterology;;   THYROIDECTOMY, PARTIAL  1981   TONSILLECTOMY     UMBILICAL HERNIA REPAIR  2000   WISDOM TOOTH EXTRACTION     Social History   Socioeconomic History   Marital status: Divorced    Spouse name: Not on file   Number of children: 1   Years of education: Not on file   Highest education level: Not on file  Occupational History   Occupation: Retired  Tobacco Use   Smoking status: Former   Smokeless tobacco: Never  Scientific laboratory technician Use: Never used  Substance and Sexual Activity   Alcohol use: Sullivan    Alcohol/week: 0.0 standard drinks   Drug  use: Sullivan   Sexual activity: Not Currently    Comment: 1st intercourse- 31, partners- 56, divorced  Other Topics Concern   Not on file  Social History Narrative   Divorced   Retired - Engineer, maintenance (IT)   Social Determinants of Radio broadcast assistant Strain: Low Risk    Difficulty of Paying Living Expenses: Not hard at all  Food Insecurity: Sullivan Food Insecurity   Worried About Charity fundraiser in the Last Year: Never true   Arboriculturist in the Last Year: Never true  Transportation Needs: Sullivan Transportation Needs   Lack of Transportation (Medical): Sullivan   Lack of Transportation (Non-Medical): Sullivan  Physical Activity: Insufficiently Active   Days of Exercise per Week: 7 days   Minutes of Exercise per Session: 20 min  Stress: Sullivan Stress Concern Present   Feeling of Stress : Not at all  Social Connections: Moderately Integrated   Frequency of Communication with Friends and Family: More than three times a week   Frequency of Social Gatherings with Friends and Family: Twice a week   Attends Religious Services: More than 4 times per year   Active Member of [employer] or Organizations: Yes   Attends Music therapist: More than 4 times per year   Marital Status: Divorced   Allergies  Allergen Reactions   Amlodipine     Gingival hyperplasia   Hctz [Hydrochlorothiazide] Other (See Comments)    gout   Family History  Problem Relation Age of Onset   Diabetes Mother        DM2   Hypertension Mother    Pneumonia Mother        died from this condition   Colon polyps Mother    Heart disease Father        died from this condition   Colon polyps Father    Kidney disease Sister        HBP and DM2; relative died from this condition   Colon polyps Sister    Heart disease Brother        MI - HBP and DM2   Colon polyps Brother    Colon polyps Brother    Colon cancer Neg Hx    Esophageal cancer Neg Hx    Rectal cancer Neg Hx    Stomach cancer Neg Hx     Current Outpatient  Medications (Endocrine & Metabolic):    empagliflozin (JARDIANCE) 10 MG TABS tablet, Take 1 tablet (10 mg total) by mouth daily before breakfast.   levothyroxine (SYNTHROID) 50 MCG tablet, TAKE 1 TABLET(50 MCG) BY MOUTH DAILY BEFORE BREAKFAST (Patient taking differently: Take 50 mcg by mouth daily before breakfast.)  Current Outpatient Medications (Cardiovascular):    atorvastatin (LIPITOR) 40 MG tablet,  Take 1 tablet (40 mg total) by mouth daily.   cloNIDine (CATAPRES - DOSED IN MG/24 HR) 0.3 mg/24hr patch, Place 0.3 mg onto the skin every Wednesday.   cloNIDine (CATAPRES - DOSED IN MG/24 HR) 0.3 mg/24hr patch, 0.3 mg once a week.   cloNIDine (CATAPRES) 0.2 MG tablet, Take 0.2 mg by mouth 3 (three) times daily.   irbesartan (AVAPRO) 300 MG tablet, TAKE 1 TABLET(300 MG) BY MOUTH IN THE MORNING   minoxidil (LONITEN) 2.5 MG tablet, Take 5 mg by mouth in the morning, at noon, and at bedtime.   nebivolol (BYSTOLIC) 5 MG tablet, Take 5 mg by mouth in the morning.   spironolactone (ALDACTONE) 100 MG tablet, Take 100 mg by mouth every evening.   Current Outpatient Medications (Analgesics):    aspirin EC 81 MG tablet, Take 81 mg by mouth every evening. Swallow whole.   aspirin-acetaminophen-caffeine (EXCEDRIN MIGRAINE) 250-250-65 MG tablet, Take 1-2 tablets by mouth every 6 (six) hours as needed for headache (sinus headaches/pain).  Current Outpatient Medications (Hematological):    vitamin B-12 (CYANOCOBALAMIN) 500 MCG tablet, Take 1,000 mcg by mouth every evening.  Current Outpatient Medications (Other):    ALPRAZolam (XANAX) 0.25 MG tablet, Take 1 tablet (0.25 mg total) by mouth 2 (two) times daily as needed for anxiety (prior to dentist).   cholecalciferol (VITAMIN D3) 25 MCG (1000 UT) tablet, Take 1,000 Units by mouth daily.   fish oil-omega-3 fatty acids 1000 MG capsule, Take 2 g by mouth daily.   gabapentin (NEURONTIN) 100 MG capsule, Take 1 capsule (100 mg total) by mouth at bedtime.    latanoprost (XALATAN) 0.005 % ophthalmic solution, Place 1 drop into both eyes at bedtime.    Multiple Vitamin (MULTIVITAMIN WITH MINERALS) TABS tablet, Take 1 tablet by mouth every evening.   Omega-3 Fatty Acids (FISH OIL) 1000 MG CAPS, Take 1,000 mg by mouth at bedtime.   timolol (TIMOPTIC) 0.5 % ophthalmic solution, Place 1 drop into both eyes 2 (two) times daily.    Review of Systems:  Sullivan headache, visual changes, nausea, vomiting, diarrhea, constipation, dizziness, abdominal pain, skin rash, fevers, chills, night sweats, weight loss, swollen lymph nodes, body aches, joint swelling, chest pain, shortness of breath, mood changes. POSITIVE muscle aches  Objective  Blood pressure 132/88, pulse 81, height _0  (1.651 m), weight 198 lb (89.8 kg), SpO2 98 %.   General: Sullivan apparent distress alert and oriented x3 mood and affect normal, dressed appropriately.  HEENT: Pupils equal, extraocular movements intact  Respiratory: Patient's speak in full sentences and does not appear short of breath  Cardiovascular: Sullivan lower extremity edema, non tender, Sullivan erythema  Gait antalgic gait noted. Patient does have some still knee instability noted.   Impression and Recommendations:     The above documentation has been reviewed and is accurate and complete Lyndal Pulley, DO

## 2021-03-20 ENCOUNTER — Other Ambulatory Visit: Payer: Self-pay

## 2021-03-20 ENCOUNTER — Ambulatory Visit: Payer: Medicare Other | Admitting: Family Medicine

## 2021-03-20 DIAGNOSIS — M170 Bilateral primary osteoarthritis of knee: Secondary | ICD-10-CM | POA: Diagnosis not present

## 2021-03-20 MED ORDER — GABAPENTIN 100 MG PO CAPS: 100.0000 mg | ORAL_CAPSULE | Freq: Every day | ORAL | 0 refills | Status: DC

## 2021-03-20 NOTE — Patient Instructions (Signed)
Gabapentin 100mg  at night See me again in 6-8 weeks

## 2021-03-20 NOTE — Assessment & Plan Note (Signed)
Patient is responded extremely well to the viscosupplementation at this time.  Last time was given was in August.  Discussed with patient that possibly with the cold weather we may see an exacerbation.  Follow-up with me again in 6 to 8 weeks.

## 2021-03-21 DIAGNOSIS — U071 COVID-19: Secondary | ICD-10-CM | POA: Diagnosis not present

## 2021-03-23 ENCOUNTER — Ambulatory Visit: Payer: Medicare Other | Admitting: Podiatry

## 2021-03-27 DIAGNOSIS — U071 COVID-19: Secondary | ICD-10-CM | POA: Diagnosis not present

## 2021-04-12 ENCOUNTER — Ambulatory Visit (INDEPENDENT_AMBULATORY_CARE_PROVIDER_SITE_OTHER): Payer: Medicare Other | Admitting: Obstetrics & Gynecology

## 2021-04-12 ENCOUNTER — Ambulatory Visit: Payer: Medicare Other

## 2021-04-12 ENCOUNTER — Other Ambulatory Visit: Payer: Self-pay

## 2021-04-12 ENCOUNTER — Encounter: Payer: Self-pay | Admitting: Obstetrics & Gynecology

## 2021-04-12 ENCOUNTER — Telehealth: Payer: Self-pay

## 2021-04-12 VITALS — BP 128/84 | HR 78 | Resp 20 | Ht 64.76 in | Wt 200.2 lb

## 2021-04-12 DIAGNOSIS — Z9189 Other specified personal risk factors, not elsewhere classified: Secondary | ICD-10-CM | POA: Diagnosis not present

## 2021-04-12 DIAGNOSIS — R87610 Atypical squamous cells of undetermined significance on cytologic smear of cervix (ASC-US): Secondary | ICD-10-CM | POA: Diagnosis not present

## 2021-04-12 DIAGNOSIS — Z13820 Encounter for screening for osteoporosis: Secondary | ICD-10-CM | POA: Diagnosis not present

## 2021-04-12 DIAGNOSIS — Z780 Asymptomatic menopausal state: Secondary | ICD-10-CM | POA: Diagnosis not present

## 2021-04-12 DIAGNOSIS — E6609 Other obesity due to excess calories: Secondary | ICD-10-CM

## 2021-04-12 DIAGNOSIS — Z6833 Body mass index (BMI) 33.0-33.9, adult: Secondary | ICD-10-CM

## 2021-04-12 DIAGNOSIS — Z01419 Encounter for gynecological examination (general) (routine) without abnormal findings: Secondary | ICD-10-CM | POA: Diagnosis not present

## 2021-04-12 NOTE — Telephone Encounter (Signed)
Called patient and canceled her appointment for today.

## 2021-04-12 NOTE — Progress Notes (Signed)
Jade Sullivan 1945/12/19 [id number]   History:    76 y.o. G1P1L1 Divorced.  Son moved out of state for a job.   RP:  Established patient presenting for annual gyn exam    HPI: Postmenopause, well on no hormone replacement therapy.  No postmenopausal bleeding.  No pelvic pain.  Abstinent. Pap 03/2020 Neg.  Urine and bowel movements normal.  Breasts normal.  Mammo 12/2020 Neg. Body mass index 33.56.  Good muscle mass and bone mass.  Exercising regularly with water aerobics and weightlifting.  Healthy nutrition.  Health labs with nephrologist.  Stage II renal disease. Colonoscopy in 2022.  BD 03/2016 Normal.  Will repeat this year. Sleep apnea using the machine.  Past medical history,surgical history, family history and social history were all reviewed and documented in the EPIC chart.  Gynecologic History No LMP recorded. Patient is postmenopausal.  Obstetric History OB History  Gravida Para Term Preterm AB Living  1 1       1   SAB IAB Ectopic Multiple Live Births               # Outcome Date GA Lbr Len/2nd Weight Sex Delivery Anes PTL Lv  1 Para              ROS: A ROS was performed and pertinent positives and negatives are included in the history.  GENERAL: No fevers or chills. HEENT: No change in vision, no earache, sore throat or sinus congestion. NECK: No pain or stiffness. CARDIOVASCULAR: No chest pain or pressure. No palpitations. PULMONARY: No shortness of breath, cough or wheeze. GASTROINTESTINAL: No abdominal pain, nausea, vomiting or diarrhea, melena or bright red blood per rectum. GENITOURINARY: No urinary frequency, urgency, hesitancy or dysuria. MUSCULOSKELETAL: No joint or muscle pain, no back pain, no recent trauma. DERMATOLOGIC: No rash, no itching, no lesions. ENDOCRINE: No polyuria, polydipsia, no heat or cold intolerance. No recent change in weight. HEMATOLOGICAL: No anemia or easy bruising or bleeding. NEUROLOGIC: No headache, seizures, numbness, tingling or  weakness. PSYCHIATRIC: No depression, no loss of interest in normal activity or change in sleep pattern.     Exam:   BP 128/84 (BP Location: Left Arm)    Pulse 78    Resp 20    Ht 5' 4.76" (1.645 m)    Wt 200 lb 3.2 oz (90.8 kg)    SpO2 98%    BMI 33.56 kg/m   Body mass index is 33.56 kg/m.  General appearance : Well developed well nourished female. No acute distress HEENT: Eyes: no retinal hemorrhage or exudates,  Neck supple, trachea midline, no carotid bruits, no thyroidmegaly Lungs: Clear to auscultation, no rhonchi or wheezes, or rib retractions  Heart: Regular rate and rhythm, no murmurs or gallops Breast:Examined in sitting and supine position were symmetrical in appearance, no palpable masses or tenderness,  no skin retraction, no nipple inversion, no nipple discharge, no skin discoloration, no axillary or supraclavicular lymphadenopathy Abdomen: no palpable masses or tenderness, no rebound or guarding Extremities: no edema or skin discoloration or tenderness  Pelvic: Vulva: Normal             Vagina: No gross lesions or discharge  Cervix: No gross lesions or discharge  Uterus  AV, normal size, shape and consistency, non-tender and mobile  Adnexa  Without masses or tenderness  Anus: Normal   Assessment/Plan:  76 y.o. female for annual exam   1. Well female exam with routine gynecological exam Postmenopause, well on no  hormone replacement therapy.  No postmenopausal bleeding.  No pelvic pain.  Abstinent. Pap 03/2020 Neg.  Urine and bowel movements normal.  Breasts normal. Mammo 12/2020 Neg. Body mass index 33.56.  Good muscle mass and bone mass.  Exercising regularly with water aerobics and weightlifting.  Healthy nutrition.  Health labs with nephrologist. Stage II renal disease. Colonoscopy in 2022.  BD 03/2016 Normal.  Will repeat this year. Sleep apnea using the machine.  2. At risk for infection  3. ASCUS of cervix with negative high risk HPV Pap Neg 03/2020.  Will  repeat Pap next year.  4. Postmenopausal Postmenopause, well on no hormone replacement therapy.  No postmenopausal bleeding.  No pelvic pain.  Abstinent.  BD normal in 03/2016.  Will schedule here now. - DG Bone Density; Future  5. Screening for osteoporosis Last BD normal in 03/2016.  Kidney disease stage 2-3.  Postmenopausal on no HRT.  Will repeat BD here now.  Vit D supplements, Ca++ 1.5 g/d total and continue regular weight bearing physical activities. - DG Bone Density; Future  6. Class 1 obesity due to excess calories with serious comorbidity and body mass index (BMI) of 33.0 to 33.9 in adult  Lost weight x last year.  Continue on same diet and fitness program.  Princess Bruins MD, 9:51 AM 04/12/2021

## 2021-04-12 NOTE — Telephone Encounter (Signed)
Patient saw her GYN doctor today and they recorded patient's BP at 128/84 HR 78. Patient stated that her BP has been good and she has been keeping a log. Encouraged patient to call our office if her BP was continuously over 140/90. Patient verbalized understanding.

## 2021-04-17 DIAGNOSIS — H25813 Combined forms of age-related cataract, bilateral: Secondary | ICD-10-CM | POA: Diagnosis not present

## 2021-04-17 DIAGNOSIS — H401131 Primary open-angle glaucoma, bilateral, mild stage: Secondary | ICD-10-CM | POA: Diagnosis not present

## 2021-04-20 ENCOUNTER — Encounter: Payer: Self-pay | Admitting: Podiatry

## 2021-04-20 ENCOUNTER — Other Ambulatory Visit: Payer: Self-pay

## 2021-04-20 ENCOUNTER — Ambulatory Visit: Payer: Medicare Other | Admitting: Podiatry

## 2021-04-20 DIAGNOSIS — M79675 Pain in left toe(s): Secondary | ICD-10-CM | POA: Diagnosis not present

## 2021-04-20 DIAGNOSIS — M79674 Pain in right toe(s): Secondary | ICD-10-CM

## 2021-04-20 DIAGNOSIS — E1122 Type 2 diabetes mellitus with diabetic chronic kidney disease: Secondary | ICD-10-CM | POA: Diagnosis not present

## 2021-04-20 DIAGNOSIS — B351 Tinea unguium: Secondary | ICD-10-CM

## 2021-04-20 DIAGNOSIS — N1831 Chronic kidney disease, stage 3a: Secondary | ICD-10-CM

## 2021-04-20 DIAGNOSIS — Q828 Other specified congenital malformations of skin: Secondary | ICD-10-CM | POA: Diagnosis not present

## 2021-04-20 NOTE — Progress Notes (Signed)
This patient presents to the office with chief complaint of long thick nails and diabetic feet.  This patient  says there  is  no pain and discomfort in their feet.  This patient says there are long thick painful nails.  These nails are painful walking and wearing shoes.  Patient has no history of infection or drainage from both feet.  Patient is unable to  self treat his own nails .  She also has painful callus on both her forefeet.This patient presents  to the office today for treatment of the  long nails , treatment of callus and a foot evaluation due to history of  diabetes.  General Appearance  Alert, conversant and in no acute stress.  Vascular  Dorsalis pedis and posterior tibial  pulses are palpable  bilaterally.  Capillary return is within normal limits  bilaterally. Temperature is within normal limits  bilaterally.  Neurologic  Senn-Weinstein monofilament wire test within normal limits  bilaterally. Muscle power within normal limits bilaterally.  Nails Thick disfigured discolored nails with subungual debris  from hallux to fifth toes bilaterally. No evidence of bacterial infection or drainage bilaterally.  Orthopedic  No limitations of motion of motion feet .  No crepitus or effusions noted.  No bony pathology or digital deformities noted.  Skin  normotropic skin .  No signs of infections or ulcers noted.   Porokeratosis sub 3 left foot and  sub 4th right foot.  Onychomycosis  Diabetes with no foot complications  Porokeratosis B/L  IE  Debride nails x 10 with nail nipper followed by dremel tool.  Debride porokeratosis  with # 15 blade.  A diabetic foot exam was performed and there is no evidence of any vascular or neurologic pathology.   RTC 3 months.   Gardiner Barefoot DPM

## 2021-04-21 DIAGNOSIS — U071 COVID-19: Secondary | ICD-10-CM | POA: Diagnosis not present

## 2021-04-23 ENCOUNTER — Telehealth: Payer: Self-pay

## 2021-04-23 NOTE — Progress Notes (Signed)
Chronic Care Management Pharmacy Assistant   Name: Jade Sullivan  MRN: [id number] DOB: 1945/08/08   Reason for Encounter: Disease State  Appt: 06/25/21 @ 10am with Linna Hoff Conditions to be addressed/monitored: DMII   Recent office visits:  02/14/21 Hoyt Koch, MD-PCP (diabetes mellitus)   Recent consult visits:  04/20/21 Gardiner Barefoot, DPM-Podiatry (nail problem) no med changes 04/12/21 Princess Bruins, MD-Obstetrics and Gynecology (female exam)  03/20/21 Lyndal Pulley, DO-Sports Medicine (osteoarthritis of both knees) med change: gabapentin 100 mg at night 03/12/21 Early Osmond, MD-Cardiology (Aortic atherosclerosis) med changes:increase atorvastatin (Lipitor) to 40 mg, increase atorvastatin (Lipitor) to 40 mg  01/23/21 Lyndal Pulley, DO-Sports Medicine (osteoarthritis of both knees)  Hospital visits:  None in previous 6 months  Medications: Outpatient Encounter Medications as of 04/23/2021  Medication Sig   ALPRAZolam (XANAX) 0.25 MG tablet Take 1 tablet (0.25 mg total) by mouth 2 (two) times daily as needed for anxiety (prior to dentist).   aspirin EC 81 MG tablet Take 81 mg by mouth every evening. Swallow whole.   aspirin-acetaminophen-caffeine (EXCEDRIN MIGRAINE) 250-250-65 MG tablet Take 1-2 tablets by mouth every 6 (six) hours as needed for headache (sinus headaches/pain).   atorvastatin (LIPITOR) 40 MG tablet Take 1 tablet (40 mg total) by mouth daily.   cholecalciferol (VITAMIN D3) 25 MCG (1000 UT) tablet Take 1,000 Units by mouth daily.   cloNIDine (CATAPRES - DOSED IN MG/24 HR) 0.3 mg/24hr patch Place 0.3 mg onto the skin every Wednesday.   cloNIDine (CATAPRES - DOSED IN MG/24 HR) 0.3 mg/24hr patch 0.3 mg once a week.   cloNIDine (CATAPRES) 0.2 MG tablet Take 0.2 mg by mouth 3 (three) times daily.   empagliflozin (JARDIANCE) 10 MG TABS tablet Take 1 tablet (10 mg total) by mouth daily before breakfast.   fish oil-omega-3 fatty acids 1000 MG  capsule Take 2 g by mouth daily.   gabapentin (NEURONTIN) 100 MG capsule Take 1 capsule (100 mg total) by mouth at bedtime.   irbesartan (AVAPRO) 300 MG tablet TAKE 1 TABLET(300 MG) BY MOUTH IN THE MORNING   latanoprost (XALATAN) 0.005 % ophthalmic solution Place 1 drop into both eyes at bedtime.    levothyroxine (SYNTHROID) 50 MCG tablet TAKE 1 TABLET(50 MCG) BY MOUTH DAILY BEFORE BREAKFAST (Patient taking differently: Take 50 mcg by mouth daily before breakfast.)   minoxidil (LONITEN) 2.5 MG tablet Take 5 mg by mouth in the morning, at noon, and at bedtime.   Multiple Vitamin (MULTIVITAMIN WITH MINERALS) TABS tablet Take 1 tablet by mouth every evening.   nebivolol (BYSTOLIC) 5 MG tablet Take 5 mg by mouth in the morning.   spironolactone (ALDACTONE) 100 MG tablet Take 100 mg by mouth every evening.   timolol (TIMOPTIC) 0.5 % ophthalmic solution Place 1 drop into both eyes 2 (two) times daily.   vitamin B-12 (CYANOCOBALAMIN) 500 MCG tablet Take 1,000 mcg by mouth every evening.   No facility-administered encounter medications on file as of 04/23/2021.   Recent Relevant Labs: Lab Results  Component Value Date/Time   HGBA1C 6.1 (A) 02/14/2021 11:09 AM   HGBA1C 6.5 05/17/2020 10:50 AM   HGBA1C 9.8 (H) 04/14/2019 04:07 AM    Kidney Function Lab Results  Component Value Date/Time   CREATININE 0.98 02/14/2021 11:36 AM   CREATININE 1.00 05/17/2020 10:50 AM   GFR 56.57 (L) 02/14/2021 11:36 AM   GFRNONAA 44 (L) 05/03/2019 12:55 PM   GFRAA 51 (L) 05/03/2019 12:55 PM    Current  antihyperglycemic regimen:  Jardiance 10 mg  What recent interventions/DTPs have been made to improve glycemic control:  None noted  Have there been any recent hospitalizations or ED visits since last visit with CPP? No  Patient denies hypoglycemic symptoms, including None Patient denies hyperglycemic symptoms, including none  How often are you checking your blood sugar? twice daily  What are your blood  sugars ranging? Patient states blood sugar ranges between 120-160 Fasting: 118 During the week, how often does your blood glucose drop below 70? Never, the lowest it has gotten was about 90  Are you checking your feet daily/regularly? Patient states that she is not having any problems with her feet swelling, no redness or sores not healing. Saw a podiatrist the other day about her toenails  Adherence Review: Is the patient currently on a STATIN medication? Yes Is the patient currently on ACE/ARB medication? Yes Does the patient have >5 day gap between last estimated fill dates? Yes   Care Gaps: Colonoscopy-06/27/20 Diabetic Foot Exam-NA Mammogram-NA Ophthalmology-05/08/20 Dexa Scan -NA  Annual Well Visit -NA Micro albumin-NA Hemoglobin A1c-02/14/21  Star Rating Drugs: Atorvastatin 40 mg-last fill 03/12/21 90 ds  Irbesartan 300 mg-last fill 01/26/21 90 ds  Ethelene Hal Clinical Pharmacist Assistant [phone]

## 2021-04-26 ENCOUNTER — Telehealth: Payer: Self-pay | Admitting: Internal Medicine

## 2021-04-26 MED ORDER — FLUCONAZOLE 150 MG PO TABS: 150.0000 mg | ORAL_TABLET | ORAL | 0 refills | Status: DC

## 2021-04-26 NOTE — Telephone Encounter (Signed)
Patient calling in  Patient says she believes the medication empagliflozin (JARDIANCE) 10 MG TABS tablet has caused her to get a yeast infection  Itching/Burning (2-3 days)   Wants to know if provider will send antibiotic to pharmacy  Please call [phone]

## 2021-04-26 NOTE — Telephone Encounter (Signed)
See below

## 2021-04-26 NOTE — Telephone Encounter (Signed)
Sent in diflucan to take 1 pill every 3 days for 3 pills. I would recommend to keep taking jardiance. If another yeast infection we can stop.

## 2021-04-27 ENCOUNTER — Telehealth: Payer: Self-pay | Admitting: Internal Medicine

## 2021-04-27 DIAGNOSIS — U071 COVID-19: Secondary | ICD-10-CM | POA: Diagnosis not present

## 2021-04-27 MED ORDER — IRBESARTAN 300 MG PO TABS: ORAL_TABLET | ORAL | 0 refills | Status: DC

## 2021-04-27 NOTE — Telephone Encounter (Signed)
Refill has been sent to the pt's pharmacy  

## 2021-04-27 NOTE — Telephone Encounter (Signed)
1.Medication Requested: irbesartan (AVAPRO) 300 MG tablet  2. Pharmacy (Name, Street, [place]): [facility], [place]  3. On Med List: yes   4. Last Visit with PCP: 02-14-2021  5. Next visit date with PCP: 05-23-2021   Agent: Please be advised that RX refills may take up to 3 business days. We ask that you follow-up with your pharmacy.

## 2021-05-04 ENCOUNTER — Other Ambulatory Visit: Payer: Self-pay | Admitting: Internal Medicine

## 2021-05-14 NOTE — Progress Notes (Signed)
Jade Sullivan Phone: [phone] Subjective:   [name], am serving as a scribe for Dr. Hulan Saas. This visit occurred during the SARS-CoV-2 public health emergency.  Safety protocols were in place, including screening questions prior to the visit, additional usage of staff PPE, and extensive cleaning of exam room while observing appropriate contact time as indicated for disinfecting solutions.  I'm seeing this patient by the request  of:  Hoyt Koch, MD  CC: Knee pain follow-up  TDV:[id number]  03/20/2021 Patient is responded extremely well to the viscosupplementation at this time.  Last time was given was in August.  Discussed with patient that possibly with the cold weather we may see an exacerbation.  Follow-up with me again in 6 to 8 weeks.  Update 05/15/2021 Jade Sullivan is a 76 y.o. female coming in with complaint of B knee pain. Patient states that she has been doing ok. R knee is painful over anterior aspect. L knee is doing well.  It does sometimes give her some difficulty with daily activities.  Sometimes does have some feeling of instability.     Past Medical History:  Diagnosis Date   Allergy    seasonal allergies   Arthritis    bilateral knees   Cataract    not a surgical candidate at this time (05/29/2020)   Chronic kidney disease    COVID-19 04/2019   Diabetes mellitus without complication ([place])    on meds   Diverticulosis of colon (without mention of hemorrhage)    Dysmetabolic syndrome X    Glaucoma    on meds   Gout    H/O hyperthyroidism    By patient h/o hyperthyroidism with protosis, s/p subtotal thyroidectomy. On no replacement therapy. Last thyroid functions 2013 in [place]  Plan Will need thyroid functions Feb '15 with recommendations to follow.     Heart murmur    hx of   Hyperlipidemia    on meds   Hypertension    on multiple meds   Hypothyroidism    on  meds   Kidney lesion    On supplemental oxygen by nasal cannula    at bedtime   Pancreatic cyst    Tubular adenoma of colon    Past Surgical History:  Procedure Laterality Date   BIOPSY  06/27/2020   Procedure: BIOPSY;  Surgeon: Jerene Bears, MD;  Location: [facility];  Service: Gastroenterology;;   [place]  2016   JMP-MAC-2 day suprep (good)-TICS/TA   COLONOSCOPY WITH PROPOFOL N/A 06/27/2020   Procedure: COLONOSCOPY WITH PROPOFOL;  Surgeon: Jerene Bears, MD;  Location: [facility];  Service: Gastroenterology;  Laterality: N/A;   EYE SURGERY     LIPOMA EXCISION Right 11/24/2014   Procedure: EXCISION RIGHT SHOULDER LIPOMA;  Surgeon: Erroll Luna, MD;  Location: [facility];  Service: General;  Laterality: Right;   POLYPECTOMY  2016   TA   POLYPECTOMY  06/27/2020   Procedure: POLYPECTOMY;  Surgeon: Jerene Bears, MD;  Location: [facility];  Service: Gastroenterology;;   THYROIDECTOMY, PARTIAL  1981   TONSILLECTOMY     UMBILICAL HERNIA REPAIR  2000   WISDOM TOOTH EXTRACTION     Social History   Socioeconomic History   Marital status: Divorced    Spouse name: Not on file   Number of children: 1   Years of education: Not on file   Highest  education level: Not on file  Occupational History   Occupation: Retired  Tobacco Use   Smoking status: Former   Smokeless tobacco: Never  Scientific laboratory technician Use: Never used  Substance and Sexual Activity   Alcohol use: No    Alcohol/week: 0.0 standard drinks   Drug use: No   Sexual activity: Not Currently    Comment: 1st intercourse- 16, partners- 18, divorced  Other Topics Concern   Not on file  Social History Narrative   Divorced   Retired - Engineer, maintenance (IT)   Social Determinants of Radio broadcast assistant Strain: Low Risk    Difficulty of Paying Living Expenses: Not hard at all  Food Insecurity: No Food Insecurity   Worried About Charity fundraiser in the Last Year: Never  true   Arboriculturist in the Last Year: Never true  Transportation Needs: No Transportation Needs   Lack of Transportation (Medical): No   Lack of Transportation (Non-Medical): No  Physical Activity: Insufficiently Active   Days of Exercise per Week: 7 days   Minutes of Exercise per Session: 20 min  Stress: No Stress Concern Present   Feeling of Stress : Not at all  Social Connections: Moderately Integrated   Frequency of Communication with Friends and Family: More than three times a week   Frequency of Social Gatherings with Friends and Family: Twice a week   Attends Religious Services: More than 4 times per year   Active Member of [employer] or Organizations: Yes   Attends Music therapist: More than 4 times per year   Marital Status: Divorced   Allergies  Allergen Reactions   Amlodipine     Gingival hyperplasia   Hctz [Hydrochlorothiazide] Other (See Comments)    gout   Family History  Problem Relation Age of Onset   Diabetes Mother        DM2   Hypertension Mother    Pneumonia Mother        died from this condition   Colon polyps Mother    Heart disease Father        died from this condition   Colon polyps Father    Kidney disease Sister        HBP and DM2; relative died from this condition   Colon polyps Sister    Heart disease Brother        MI - HBP and DM2   Colon polyps Brother    Colon polyps Brother    Colon cancer Neg Hx    Esophageal cancer Neg Hx    Rectal cancer Neg Hx    Stomach cancer Neg Hx     Current Outpatient Medications (Endocrine & Metabolic):    empagliflozin (JARDIANCE) 10 MG TABS tablet, Take 1 tablet (10 mg total) by mouth daily before breakfast.   levothyroxine (SYNTHROID) 50 MCG tablet, TAKE 1 TABLET(50 MCG) BY MOUTH DAILY BEFORE BREAKFAST (Patient taking differently: Take 50 mcg by mouth daily before breakfast.)  Current Outpatient Medications (Cardiovascular):    atorvastatin (LIPITOR) 40 MG tablet, Take 1 tablet (40 mg  total) by mouth daily.   cloNIDine (CATAPRES - DOSED IN MG/24 HR) 0.3 mg/24hr patch, Place 0.3 mg onto the skin every Wednesday.   cloNIDine (CATAPRES - DOSED IN MG/24 HR) 0.3 mg/24hr patch, 0.3 mg once a week.   cloNIDine (CATAPRES) 0.2 MG tablet, Take 0.2 mg by mouth 3 (three) times daily.   irbesartan (AVAPRO) 300 MG tablet,  TAKE 1 TABLET(300 MG) BY MOUTH IN THE MORNING   minoxidil (LONITEN) 2.5 MG tablet, Take 5 mg by mouth in the morning, at noon, and at bedtime.   nebivolol (BYSTOLIC) 5 MG tablet, Take 5 mg by mouth in the morning.   spironolactone (ALDACTONE) 100 MG tablet, Take 100 mg by mouth every evening.   Current Outpatient Medications (Analgesics):    aspirin EC 81 MG tablet, Take 81 mg by mouth every evening. Swallow whole.   aspirin-acetaminophen-caffeine (EXCEDRIN MIGRAINE) 250-250-65 MG tablet, Take 1-2 tablets by mouth every 6 (six) hours as needed for headache (sinus headaches/pain).  Current Outpatient Medications (Hematological):    vitamin B-12 (CYANOCOBALAMIN) 500 MCG tablet, Take 1,000 mcg by mouth every evening.  Current Outpatient Medications (Other):    ALPRAZolam (XANAX) 0.25 MG tablet, Take 1 tablet (0.25 mg total) by mouth 2 (two) times daily as needed for anxiety (prior to dentist).   cholecalciferol (VITAMIN D3) 25 MCG (1000 UT) tablet, Take 1,000 Units by mouth daily.   fish oil-omega-3 fatty acids 1000 MG capsule, Take 2 g by mouth daily.   fluconazole (DIFLUCAN) 150 MG tablet, Take 1 tablet (150 mg total) by mouth every 3 (three) days.   gabapentin (NEURONTIN) 100 MG capsule, Take 1 capsule (100 mg total) by mouth at bedtime.   latanoprost (XALATAN) 0.005 % ophthalmic solution, Place 1 drop into both eyes at bedtime.    Multiple Vitamin (MULTIVITAMIN WITH MINERALS) TABS tablet, Take 1 tablet by mouth every evening.   timolol (TIMOPTIC) 0.5 % ophthalmic solution, Place 1 drop into both eyes 2 (two) times daily.   Reviewed prior external information  including notes and imaging from  primary care provider As well as notes that were available from care everywhere and other healthcare systems.  Past medical history, social, surgical and family history all reviewed in electronic medical record.  No pertanent information unless stated regarding to the chief complaint.   Review of Systems:  No headache, visual changes, nausea, vomiting, diarrhea, constipation, dizziness, abdominal pain, skin rash, fevers, chills, night sweats, weight loss, swollen lymph nodes, body aches, joint swelling, chest pain, shortness of breath, mood changes. POSITIVE muscle aches  Objective  Blood pressure 132/86, pulse 75, height _0  (1.626 m), weight 199 lb (90.3 kg), SpO2 97 %.   General: No apparent distress alert and oriented x3 mood and affect normal, dressed appropriately.  HEENT: Pupils equal, extraocular movements intact  Respiratory: Patient's speak in full sentences and does not appear short of breath  Cardiovascular: Trace lower extremity edema, non tender, no erythema  Gait mild antalgic Right knee exam does have tenderness to palpation mostly over the medial joint space.  Instability noted with valgus and varus force.  Abnormal thigh to calf ratio noted.  After informed written and verbal consent, patient was seated on exam table. Right knee was prepped with alcohol swab and utilizing anterolateral approach, patient's right knee space was injected with 4:1  marcaine 0.5%: Kenalog 25m/dL. Patient tolerated the procedure well without immediate complications.   Impression and Recommendations:     The above documentation has been reviewed and is accurate and complete ZLyndal Pulley DO

## 2021-05-15 ENCOUNTER — Ambulatory Visit: Payer: Medicare Other | Admitting: Family Medicine

## 2021-05-15 ENCOUNTER — Encounter: Payer: Self-pay | Admitting: Family Medicine

## 2021-05-15 ENCOUNTER — Other Ambulatory Visit: Payer: Self-pay

## 2021-05-15 DIAGNOSIS — M170 Bilateral primary osteoarthritis of knee: Secondary | ICD-10-CM | POA: Diagnosis not present

## 2021-05-15 NOTE — Patient Instructions (Signed)
Injected R knee today Will get you approved for gel in B knees See me in 6-8 weeks

## 2021-05-15 NOTE — Assessment & Plan Note (Signed)
Chronic problem with mild worsening symptoms mostly in the right knee today.  Patient does have chronic kidney disease which does make it somewhat difficult to treat in different ways.  Discussed icing regimen and home exercises, discussed which activities to do which ones to avoid.  Increase activity slowly.  Follow-up with me again 6 to 8 weeks.

## 2021-05-17 ENCOUNTER — Ambulatory Visit: Payer: Medicare Other | Admitting: Internal Medicine

## 2021-05-22 DIAGNOSIS — U071 COVID-19: Secondary | ICD-10-CM | POA: Diagnosis not present

## 2021-05-23 ENCOUNTER — Ambulatory Visit (INDEPENDENT_AMBULATORY_CARE_PROVIDER_SITE_OTHER): Payer: Medicare Other | Admitting: Internal Medicine

## 2021-05-23 ENCOUNTER — Encounter: Payer: Self-pay | Admitting: Internal Medicine

## 2021-05-23 ENCOUNTER — Other Ambulatory Visit: Payer: Self-pay

## 2021-05-23 VITALS — BP 130/74 | HR 84 | Resp 18 | Ht 64.0 in | Wt 197.6 lb

## 2021-05-23 DIAGNOSIS — E1122 Type 2 diabetes mellitus with diabetic chronic kidney disease: Secondary | ICD-10-CM

## 2021-05-23 DIAGNOSIS — E785 Hyperlipidemia, unspecified: Secondary | ICD-10-CM | POA: Diagnosis not present

## 2021-05-23 DIAGNOSIS — I129 Hypertensive chronic kidney disease with stage 1 through stage 4 chronic kidney disease, or unspecified chronic kidney disease: Secondary | ICD-10-CM

## 2021-05-23 DIAGNOSIS — E1169 Type 2 diabetes mellitus with other specified complication: Secondary | ICD-10-CM | POA: Diagnosis not present

## 2021-05-23 DIAGNOSIS — N1831 Chronic kidney disease, stage 3a: Secondary | ICD-10-CM

## 2021-05-23 LAB — COMPREHENSIVE METABOLIC PANEL
ALT: 11 U/L (ref 0–35)
AST: 17 U/L (ref 0–37)
Albumin: 4.4 g/dL (ref 3.5–5.2)
Alkaline Phosphatase: 77 U/L (ref 39–117)
BUN: 22 mg/dL (ref 6–23)
CO2: 28 mEq/L (ref 19–32)
Calcium: 10.1 mg/dL (ref 8.4–10.5)
Chloride: 108 mEq/L (ref 96–112)
Creatinine, Ser: 1.25 mg/dL — ABNORMAL HIGH (ref 0.40–1.20)
GFR: 42.17 mL/min — ABNORMAL LOW (ref >60.00)
Glucose, Bld: 135 mg/dL — ABNORMAL HIGH (ref 70–99)
Potassium: 4 mEq/L (ref 3.5–5.1)
Sodium: 141 mEq/L (ref 135–145)
Total Bilirubin: 0.7 mg/dL (ref 0.2–1.2)
Total Protein: 7.5 g/dL (ref 6.0–8.3)

## 2021-05-23 LAB — HEMOGLOBIN A1C: Hgb A1c MFr Bld: 6.9 % — ABNORMAL HIGH (ref 4.6–6.5)

## 2021-05-23 MED ORDER — FLUCONAZOLE 150 MG PO TABS: 150.0000 mg | ORAL_TABLET | ORAL | 0 refills | Status: DC

## 2021-05-23 NOTE — Progress Notes (Signed)
° °  Subjective:   Patient ID: Jade Sullivan, female    DOB: 04-Mar-1946, 76 y.o.   MRN: [id number]  HPI The patient is a 76 YO female coming in for follow up.  Review of Systems  Constitutional: Negative.   HENT: Negative.    Eyes: Negative.   Respiratory:  Negative for cough, chest tightness and shortness of breath.   Cardiovascular:  Negative for chest pain, palpitations and leg swelling.  Gastrointestinal:  Negative for abdominal distention, abdominal pain, constipation, diarrhea, nausea and vomiting.  Musculoskeletal: Negative.   Skin: Negative.   Neurological: Negative.   Psychiatric/Behavioral: Negative.     Objective:  Physical Exam Constitutional:      Appearance: She is well-developed.  HENT:     Head: Normocephalic and atraumatic.  Cardiovascular:     Rate and Rhythm: Normal rate and regular rhythm.  Pulmonary:     Effort: Pulmonary effort is normal. No respiratory distress.     Breath sounds: Normal breath sounds. No wheezing or rales.  Abdominal:     General: Bowel sounds are normal. There is no distension.     Palpations: Abdomen is soft.     Tenderness: There is no abdominal tenderness. There is no rebound.  Musculoskeletal:     Cervical back: Normal range of motion.  Skin:    General: Skin is warm and dry.  Neurological:     Mental Status: She is alert and oriented to person, place, and time.     Coordination: Coordination normal.    Vitals:   05/23/21 1042  BP: 130/74  Pulse: 84  Resp: 18  SpO2: 95%  Weight: 197 lb 9.6 oz (89.6 kg)  Height: 5\' 4"  (1.626 m)    This visit occurred during the SARS-CoV-2 public health emergency.  Safety protocols were in place, including screening questions prior to the visit, additional usage of staff PPE, and extensive cleaning of exam room while observing appropriate contact time as indicated for disinfecting solutions.   Assessment & Plan:

## 2021-05-23 NOTE — Patient Instructions (Signed)
We will check the labs today.  We have refilled the diflucan to take if needed.

## 2021-05-25 ENCOUNTER — Encounter: Payer: Self-pay | Admitting: Internal Medicine

## 2021-05-25 NOTE — Assessment & Plan Note (Signed)
BP at goal today on current regimen. Was elevated at recent cardiology visit. She is not having symptoms or high readings at home.

## 2021-05-25 NOTE — Assessment & Plan Note (Signed)
Cardiology recently increased lipitor to 40 mg daily and not due for repeat as this is too soon. Will need repeat lipid panel at next visit. She is having some fatigue from 40 mg lipitor but will try to continue.

## 2021-05-25 NOTE — Assessment & Plan Note (Signed)
Needs repeat renal function today checking CMP due to addition of jardiance.

## 2021-05-25 NOTE — Assessment & Plan Note (Signed)
We had stopped metformin due to good control. For some reason cardiology started jardiance (she does not have CHF). They did not recheck renal function so checking both HgA1c and CMP today. Adjust as needed. She has already had 1 yeast infection and may have another starting. Rx diflucan today.

## 2021-05-28 ENCOUNTER — Other Ambulatory Visit: Payer: Self-pay | Admitting: Internal Medicine

## 2021-05-28 DIAGNOSIS — U071 COVID-19: Secondary | ICD-10-CM | POA: Diagnosis not present

## 2021-05-31 ENCOUNTER — Telehealth: Payer: Self-pay | Admitting: Internal Medicine

## 2021-05-31 NOTE — Telephone Encounter (Signed)
Per Dr. Ali Lowe patient to discontinue Jardiance at this time and add to allergy list. Patient notified and verbalized understanding. ?

## 2021-05-31 NOTE — Telephone Encounter (Signed)
Returned patient's call. ? ?Patient states she has been having frequent yeast infections since starting on Jardiance. She also states she is concerned about the effects of this medication on her kidney function after seeing decreased GFR on most recent labwork (05/23/21). Per Dr. [name], recommendation was to not increase dose of Jardiance. ? ?Hgb A1c also increased from 6.1 to 6.9 in the past 3 months. Patient states she "tries to eat brown rice and greens sometimes." Reviewed diabetic diet and importance of controlling blood sugars. Advised patient that elevated blood sugars can also increase occurrence of yeast infections and effects kidney function. ? ?Patient verbalized understanding. Patient is asking if Dr. Ali Lowe has any recommendations regarding her taking the Jardiance. ? ?She would like to know if she can stop taking Jardiance due to frequent yeast infections. ? ?Will forward to Dr. Ali Lowe to review and advise. ?

## 2021-05-31 NOTE — Telephone Encounter (Signed)
Pt c/o medication issue: ? ?1. Name of Medication: empagliflozin (JARDIANCE) 10 MG TABS tablet ? ?2. How are you currently taking this medication (dosage and times per day)? Take 1 tablet (10 mg total) by mouth daily before breakfast. ? ?3. Are you having a reaction (difficulty breathing--STAT)? no ? ?4. What is your medication issue? Patient wants to talk to the nurse bout this medication. She keeps getting a yeast infection from it. ? ?

## 2021-06-19 DIAGNOSIS — U071 COVID-19: Secondary | ICD-10-CM | POA: Diagnosis not present

## 2021-06-25 ENCOUNTER — Ambulatory Visit (INDEPENDENT_AMBULATORY_CARE_PROVIDER_SITE_OTHER): Payer: Medicare Other

## 2021-06-25 DIAGNOSIS — E1169 Type 2 diabetes mellitus with other specified complication: Secondary | ICD-10-CM

## 2021-06-25 DIAGNOSIS — E1122 Type 2 diabetes mellitus with diabetic chronic kidney disease: Secondary | ICD-10-CM

## 2021-06-25 DIAGNOSIS — I129 Hypertensive chronic kidney disease with stage 1 through stage 4 chronic kidney disease, or unspecified chronic kidney disease: Secondary | ICD-10-CM

## 2021-06-25 DIAGNOSIS — I700 Atherosclerosis of aorta: Secondary | ICD-10-CM

## 2021-06-25 DIAGNOSIS — N1831 Chronic kidney disease, stage 3a: Secondary | ICD-10-CM

## 2021-06-25 DIAGNOSIS — U071 COVID-19: Secondary | ICD-10-CM | POA: Diagnosis not present

## 2021-06-25 NOTE — Progress Notes (Signed)
? ?Chronic Care Management ?Pharmacy Note ? ?06/25/2021 ?Name:  Jade Sullivan MRN:  [id number] DOB:  [birth year] ? ?Summary: ?-Patient reports that she has stopped jardiance about 2 weeks ago per cardiology due to recurrent yeast infections - no issues since stopping  ?-Reports that blood pressure at home typically well controlled averaging 125-130's/80's  ?-Recent BG at home averaging 140-150's - depending on diet, can elevated into 180-200 range  ? ?Recommendations/Changes made from today's visit: ?-Recommending for patient to restart metformin - patient agreeable to restarting at 576m daily  ?-Patient to continue monitoring BG and BP daily, will reach out to either should they become uncontrolled / if she has any issues with medications  ? ?Plan: ?F/u in 4 months  ? ? ?Subjective: ?Jade ADDAIRis an 76y.o. year old female who is a primary patient of CHoyt Koch MD.  The CCM team was consulted for assistance with disease management and care coordination needs.   ? ?Engaged with patient by telephone for follow up visit in response to provider referral for pharmacy case management and/or care coordination services.  ? ?Consent to Services:  ?The patient was given information about Chronic Care Management services, agreed to services, and gave verbal consent prior to initiation of services.  Please see initial visit note for detailed documentation.  ? ?Patient Care Team: ?CHoyt Koch MD as PCP - General (Internal Medicine) ?TEarly Osmond MD as PCP - Cardiology (Cardiology) ?LPrincess Bruins MD as Consulting Physician (Obstetrics and Gynecology) ?Pyrtle, JLajuan Lines MD as Consulting Physician (Gastroenterology) ?GCorliss Parish MD as Consulting Physician (Nephrology) ?[facility]mology[place] ?MAwanda Mink MD as Consulting Physician (Ophthalmology) ?[facility]omas[facility] Blase [facility](Pharmacist) ? ?Recent office visits: ?05/23/2021 - Dr. CSharlet Salina- rx'd fluconazole for  yeast infection  ?02/14/2021- Dr. CSharlet Salina- metformin stopped due to BG under control  - referred to podiatry for foot care - f/u in 3 months  ? ?Recent consult visits: ?05/15/2020 - Dr. STamala Julian - sports Medicine - knee pain f/u  - steroid injection given - work for approval for gel injections - f/u in 6-8 weeks  ?04/20/2021 - Dr. MPrudence Davidson- Podiatry - no changes to medications - f/u in 10 weeks  ?04/12/2021 - Dr. LDellis Filbert- OB/GYN - no changes to medications  ?03/20/2021 - Dr. STamala Julian- Sports Medicine - rx'd gabapentin 100 mg hs - f/u in 6-8 weeks  ?03/12/2021 - Dr. TAli Lowe- Cardiology - increased atorvastatin to 460mdaily - started jardiance 1061maily  ? ?Hospital visits: ?None in previous 6 months ? ?Objective: ? ?Lab Results  ?Component Value Date  ? CREATININE 1.25 (H) 05/23/2021  ? BUN 22 05/23/2021  ? GFR 42.17 (L) 05/23/2021  ? GFRNONAA 44 (L) 05/03/2019  ? GFRAA 51 (L) 05/03/2019  ? NA 141 05/23/2021  ? K 4.0 05/23/2021  ? CALCIUM 10.1 05/23/2021  ? CO2 28 05/23/2021  ? GLUCOSE 135 (H) 05/23/2021  ? ? ?Lab Results  ?Component Value Date/Time  ? HGBA1C 6.9 (H) 05/23/2021 10:58 AM  ? HGBA1C 6.1 (A) 02/14/2021 11:09 AM  ? HGBA1C 6.5 05/17/2020 10:50 AM  ? FRUCTOSAMINE 283 06/11/2019 10:12 AM  ? GFR 42.17 (L) 05/23/2021 10:58 AM  ? GFR 56.57 (L) 02/14/2021 11:36 AM  ?  ?Last diabetic Eye exam: No results found for: HMDIABEYEEXA  ?Last diabetic Foot exam: No results found for: HMDIABFOOTEX  ? ?Lab Results  ?Component Value Date  ? CHOL 146 05/17/2020  ? HDL 38.00 (L)  05/17/2020  ? [employer] 87 05/17/2020  ? TRIG 105.0 05/17/2020  ? CHOLHDL 4 05/17/2020  ? ? ? ?  Latest Ref Rng & Units 05/23/2021  ? 10:58 AM 02/14/2021  ? 11:36 AM 05/17/2020  ? 10:50 AM  ?Hepatic Function  ?Total Protein 6.0 - 8.3 g/dL 7.5   7.0   6.9    ?Albumin 3.5 - 5.2 g/dL 4.4   4.1   4.1    ?AST 0 - 37 U/L _0 ?ALT 0 - 35 U/L _1 ?Alk Phosphatase 39 - 117 U/L 77   72   69    ?Total Bilirubin 0.2 - 1.2 mg/dL 0.7   0.7   0.6     ? ? ?Lab Results  ?Component Value Date/Time  ? TSH 4.23 06/11/2019 10:12 AM  ? TSH 6.55 (H) 05/12/2018 11:34 AM  ? FREET4 0.78 06/11/2019 10:12 AM  ? FREET4 0.80 05/12/2018 11:34 AM  ? ? ? ?  Latest Ref Rng & Units 02/14/2021  ? 11:36 AM 05/17/2020  ? 10:50 AM 08/25/2019  ? 11:26 AM  ?CBC  ?WBC 4.0 - 10.5 K/uL 5.2   6.1   4.5    ?Hemoglobin 12.0 - 15.0 g/dL 12.8   13.3   12.6    ?Hematocrit 36.0 - 46.0 % 38.6   39.2   37.3    ?Platelets 150.0 - 400.0 K/uL 186.0   178.0   168.0    ? ? ?Lab Results  ?Component Value Date/Time  ? VD25OH 41.4 11/11/2017 12:00 AM  ? VD25OH 37.99 03/17/2017 11:30 AM  ? ? ?Clinical ASCVD: No  ?The 10-year ASCVD risk score (Arnett DK, et al., 2019) is: 23% ?  Values used to calculate the score: ?    Age: 16 years ?    Sex: Female ?    Is Non-Hispanic African American: Yes ?    Diabetic: Yes ?    Tobacco smoker: No ?    Systolic Blood Pressure: 832 mmHg ?    Is BP treated: Yes ?    HDL Cholesterol: 38 mg/dL ?    Total Cholesterol: 146 mg/dL   ? ? ?  10/03/2020  ?  4:24 PM 08/14/2020  ? 10:38 AM 06/25/2019  ? 12:09 PM  ?Depression screen PHQ 2/9  ?Decreased Interest 0 0 0  ?Down, Depressed, Hopeless 0 0 0  ?PHQ - 2 Score 0 0 0  ?  ? ?Social History  ? ?Tobacco Use  ?Smoking Status Former  ?Smokeless Tobacco Never  ? ?BP Readings from Last 3 Encounters:  ?05/23/21 130/74  ?05/15/21 132/86  ?04/12/21 128/84  ? ?Pulse Readings from Last 3 Encounters:  ?05/23/21 84  ?05/15/21 75  ?04/12/21 78  ? ?Wt Readings from Last 3 Encounters:  ?05/23/21 197 lb 9.6 oz (89.6 kg)  ?05/15/21 199 lb (90.3 kg)  ?04/12/21 200 lb 3.2 oz (90.8 kg)  ? ?BMI Readings from Last 3 Encounters:  ?05/23/21 33.92 kg/m?  ?05/15/21 34.16 kg/m?  ?04/12/21 33.56 kg/m?  ? ? ?Assessment/Interventions: Review of patient past medical history, allergies, medications, health status, including review of consultants reports, laboratory and other test data, was performed as part of comprehensive evaluation and provision of chronic care  management services.  ? ?SDOH:  (Social Determinants of Health) assessments and interventions performed: Yes ? ?SDOH Screenings  ? ?Alcohol Screen: Low Risk   ? Last Alcohol Screening  Score (AUDIT): 0  ?Depression (PHQ2-9): Low Risk   ? PHQ-2 Score: 0  ?Financial Resource Strain: Low Risk   ? Difficulty of Paying Living Expenses: Not hard at all  ?Food Insecurity: No Food Insecurity  ? Worried About Charity fundraiser in the Last Year: Never true  ? Ran Out of Food in the Last Year: Never true  ?Housing: Low Risk   ? Last Housing Risk Score: 0  ?Physical Activity: Insufficiently Active  ? Days of Exercise per Week: 7 days  ? Minutes of Exercise per Session: 20 min  ?Social Connections: Moderately Integrated  ? Frequency of Communication with Friends and Family: More than three times a week  ? Frequency of Social Gatherings with Friends and Family: Twice a week  ? Attends Religious Services: More than 4 times per year  ? Active Member of Clubs or Organizations: Yes  ? Attends Archivist Meetings: More than 4 times per year  ? Marital Status: Divorced  ?Stress: No Stress Concern Present  ? Feeling of Stress : Not at all  ?Tobacco Use: Medium Risk  ? Smoking Tobacco Use: Former  ? Smokeless Tobacco Use: Never  ? Passive Exposure: Not on file  ?Transportation Needs: No Transportation Needs  ? Lack of Transportation (Medical): No  ? Lack of Transportation (Non-Medical): No  ? ? ?CCM Care Plan ? ?Allergies  ?Allergen Reactions  ? Amlodipine   ?  Gingival hyperplasia  ? Hctz [Hydrochlorothiazide] Other (See Comments)  ?  gout  ? Jardiance [Empagliflozin] Other (See Comments)  ?  Yeast Infections  ? ? ?Medications Reviewed Today   ? ? Reviewed by Tomasa Blase, [facility] (Pharmacist) on 06/25/21 at [place] List Status: <None>  ? ?Medication Order Taking? Sig Documenting Provider Last Dose Status Informant  ?ALPRAZolam (XANAX) 0.25 MG tablet [id number] Yes Take 1 tablet (0.25 mg total) by mouth 2 (two) times daily  as needed for anxiety (prior to dentist). Hoyt Koch, MD Taking Active   ?aspirin EC 81 MG tablet [id number] Yes Take 81 mg by mouth every evening. Swallow whole. [provider] Taking Act

## 2021-06-25 NOTE — Progress Notes (Signed)
?Charlann Boxer D.O. ?[facility] ?[place] ?Phone: [phone] ?Subjective:   ?I, Jade Sullivan, am serving as a Education administrator for Dr. Hulan Saas. ?This visit occurred during the SARS-CoV-2 public health emergency.  Safety protocols were in place, including screening questions prior to the visit, additional usage of staff PPE, and extensive cleaning of exam room while observing appropriate contact time as indicated for disinfecting solutions.  ? ?I'm seeing this patient by the request  of:  Hoyt Koch, MD ? ?CC: knee pain  ? ?AVW:[id number]  ?05/15/2021 ?Chronic problem with mild worsening symptoms mostly in the right knee today.  Patient does have chronic kidney disease which does make it somewhat difficult to treat in different ways.  Discussed icing regimen and home exercises, discussed which activities to do which ones to avoid.  Increase activity slowly.  Follow-up with me again 6 to 8 weeks. ? ?Update 06/26/2021 ?Jade Sullivan is a 76 y.o. female coming in with complaint of B knee pain. Patient states here for knee injections. No new complaints. ? ? ?  ? ?Past Medical History:  ?Diagnosis Date  ? Allergy   ? seasonal allergies  ? 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 Cataract   ? not a surgical candidate at this time (05/29/2020)  ? Chronic kidney disease   ? COVID-19 04/2019  ? Diabetes mellitus without complication ([place])   ? on meds  ? Diverticulosis of colon (without mention of hemorrhage)   ? Dysmetabolic syndrome X   ? Glaucoma   ? on meds  ? Gout   ? H/O hyperthyroidism   ? By patient h/o hyperthyroidism with protosis, s/p subtotal thyroidectomy. On no replacement therapy. Last thyroid functions 2013 in [place]  Plan Will need thyroid functions Feb '15 with recommendations to follow.    ? Heart murmur   ? hx of  ? Hyperlipidemia   ? on meds  ? Hypertension   ? on multiple meds  ? Hypothyroidism   ? on meds  ? Kidney lesion   ? On supplemental oxygen by nasal  cannula   ? at bedtime  ? Pancreatic cyst   ? Tubular adenoma of colon   ? ?Past Surgical History:  ?Procedure Laterality Date  ? BIOPSY  06/27/2020  ? Procedure: BIOPSY;  Surgeon: Jerene Bears, MD;  Location: [facility];  Service: Gastroenterology;;  ? [place]  ? COLONOSCOPY  2016  ? JMP-MAC-2 day suprep (good)-TICS/TA  ? COLONOSCOPY WITH PROPOFOL N/A 06/27/2020  ? Procedure: COLONOSCOPY WITH PROPOFOL;  Surgeon: Jerene Bears, MD;  Location: [facility];  Service: Gastroenterology;  Laterality: N/A;  ? EYE SURGERY    ? LIPOMA EXCISION Right 11/24/2014  ? Procedure: EXCISION RIGHT SHOULDER LIPOMA;  Surgeon: Erroll Luna, MD;  Location: [facility];  Service: General;  Laterality: Right;  ? POLYPECTOMY  2016  ? TA  ? POLYPECTOMY  06/27/2020  ? Procedure: POLYPECTOMY;  Surgeon: Jerene Bears, MD;  Location: [facility];  Service: Gastroenterology;;  ? THYROIDECTOMY, PARTIAL  1981  ? TONSILLECTOMY    ? UMBILICAL HERNIA REPAIR  2000  ? WISDOM TOOTH EXTRACTION    ? ?Social History  ? ?Socioeconomic History  ? Marital status: Divorced  ?  Spouse name: Not on file  ? Number of children: 1  ? Years of education: Not on file  ? Highest education level: Not on file  ?Occupational History  ? Occupation: Retired  ?  Tobacco Use  ? Smoking status: Former  ? Smokeless tobacco: Never  ?Vaping Use  ? Vaping Use: Never used  ?Substance and Sexual Activity  ? Alcohol use: No  ?  Alcohol/week: 0.0 standard drinks  ? Drug use: No  ? Sexual activity: Not Currently  ?  Comment: 1st intercourse- 23, partners- 44, divorced  ?Other Topics Concern  ? Not on file  ?Social History Narrative  ? Divorced  ? Retired - Engineer, maintenance (IT)  ? ?Social Determinants of Health  ? ?Financial Resource Strain: Low Risk   ? Difficulty of Paying Living Expenses: Not hard at all  ?Food Insecurity: No Food Insecurity  ? Worried About Charity fundraiser in the Last Year: Never true  ? Ran Out of Food in the Last Year: Never true   ?Transportation Needs: No Transportation Needs  ? Lack of Transportation (Medical): No  ? Lack of Transportation (Non-Medical): No  ?Physical Activity: Insufficiently Active  ? Days of Exercise per Week: 7 days  ? Minutes of Exercise per Session: 20 min  ?Stress: No Stress Concern Present  ? Feeling of Stress : Not at all  ?Social Connections: Moderately Integrated  ? Frequency of Communication with Friends and Family: More than three times a week  ? Frequency of Social Gatherings with Friends and Family: Twice a week  ? Attends Religious Services: More than 4 times per year  ? Active Member of Clubs or Organizations: Yes  ? Attends Archivist Meetings: More than 4 times per year  ? Marital Status: Divorced  ? ?Allergies  ?Allergen Reactions  ? Amlodipine   ?  Gingival hyperplasia  ? Hctz [Hydrochlorothiazide] Other (See Comments)  ?  gout  ? Jardiance [Empagliflozin] Other (See Comments)  ?  Yeast Infections  ? ?Family History  ?Problem Relation Age of Onset  ? Diabetes Mother   ?     DM2  ? Hypertension Mother   ? Pneumonia Mother   ?     died from this condition  ? Colon polyps Mother   ? Heart disease Father   ?     died from this condition  ? Colon polyps Father   ? Kidney disease Sister   ?     HBP and DM2; relative died from this condition  ? Colon polyps Sister   ? Heart disease Brother   ?     MI - HBP and DM2  ? Colon polyps Brother   ? Colon polyps Brother   ? Colon cancer Neg Hx   ? Esophageal cancer Neg Hx   ? Rectal cancer Neg Hx   ? Stomach cancer Neg Hx   ? ? ?Current Outpatient Medications (Endocrine & Metabolic):  ?  levothyroxine (SYNTHROID) 50 MCG tablet, Take 1 tablet (50 mcg total) by mouth daily before breakfast. ? ?Current Outpatient Medications (Cardiovascular):  ?  atorvastatin (LIPITOR) 40 MG tablet, Take 1 tablet (40 mg total) by mouth daily. ?  cloNIDine (CATAPRES - DOSED IN MG/24 HR) 0.3 mg/24hr patch, Place 0.3 mg onto the skin every Wednesday. ?  cloNIDine (CATAPRES - DOSED  IN MG/24 HR) 0.3 mg/24hr patch, 0.3 mg once a week. ?  cloNIDine (CATAPRES) 0.2 MG tablet, Take 0.2 mg by mouth 3 (three) times daily. ?  irbesartan (AVAPRO) 300 MG tablet, TAKE 1 TABLET(300 MG) BY MOUTH IN THE MORNING ?  minoxidil (LONITEN) 2.5 MG tablet, Take 5 mg by mouth in the morning, at noon, and at bedtime. ?  nebivolol (BYSTOLIC) 5 MG tablet, Take 5 mg by mouth in the morning. ?  spironolactone (ALDACTONE) 100 MG tablet, Take 100 mg by mouth every evening. ? ? ?Current Outpatient Medications (Analgesics):  ?  aspirin EC 81 MG tablet, Take 81 mg by mouth every evening. Swallow whole. ?  aspirin-acetaminophen-caffeine (EXCEDRIN MIGRAINE) 250-250-65 MG tablet, Take 1-2 tablets by mouth every 6 (six) hours as needed for headache (sinus headaches/pain). ? ?Current Outpatient Medications (Hematological):  ?  vitamin B-12 (CYANOCOBALAMIN) 500 MCG tablet, Take 1,000 mcg by mouth every evening. ? ?Current Outpatient Medications (Other):  ?  ALPRAZolam (XANAX) 0.25 MG tablet, Take 1 tablet (0.25 mg total) by mouth 2 (two) times daily as needed for anxiety (prior to dentist). ?  cholecalciferol (VITAMIN D3) 25 MCG (1000 UT) tablet, Take 1,000 Units by mouth daily. ?  fish oil-omega-3 fatty acids 1000 MG capsule, Take 2 g by mouth daily. ?  gabapentin (NEURONTIN) 100 MG capsule, Take 1 capsule (100 mg total) by mouth at bedtime. (Patient taking differently: Take 100 mg by mouth daily as needed.) ?  latanoprost (XALATAN) 0.005 % ophthalmic solution, Place 1 drop into both eyes at bedtime.  ?  Multiple Vitamin (MULTIVITAMIN WITH MINERALS) TABS tablet, Take 1 tablet by mouth every evening. ?  timolol (TIMOPTIC) 0.5 % ophthalmic solution, Place 1 drop into both eyes 2 (two) times daily. ? ? ?Reviewed prior external information including notes and imaging from  ?primary care provider ?As well as notes that were available from care everywhere and other healthcare systems. ? ?Past medical history, social, surgical and family  history all reviewed in electronic medical record.  No pertanent information unless stated regarding to the chief complaint.  ? ?Review of Systems: ? No headache, visual changes, nausea, vomiting, diarrhea, c

## 2021-06-25 NOTE — Patient Instructions (Signed)
Visit Information ? ?Following are the goals we discussed today:  ? ?Manage My Medicine  ? ?Timeframe:  Long-Range Goal ?Priority:  Medium ?Start Date:   04/25/20                          ?Expected End Date:   06/26/2022                  ?  ?- call for medicine refill 2 or 3 days before it runs out ?- call if I am sick and can't take my medicine ?- keep a list of all the medicines I take; vitamins and herbals too ?- use a pillbox to sort medicine  ?  ?Why is this important?   ?These steps will help you keep on track with your medicines. ? ?Plan: Telephone follow up appointment with care management team member scheduled for:  4 months ?The patient has been provided with contact information for the care management team and has been advised to call with any health related questions or concerns.  ? ?Tomasa Blase, PharmD ?Clinical Pharmacist, [place]  ? ?Please call the care guide team at [phone] if you need to cancel or reschedule your appointment.  ? ?Patient verbalizes understanding of instructions and care plan provided today and agrees to view in [place]. Active MyChart status confirmed with patient.   ? ?

## 2021-06-26 ENCOUNTER — Ambulatory Visit: Payer: Medicare Other | Admitting: Family Medicine

## 2021-06-26 ENCOUNTER — Other Ambulatory Visit: Payer: Self-pay

## 2021-06-26 DIAGNOSIS — M170 Bilateral primary osteoarthritis of knee: Secondary | ICD-10-CM | POA: Diagnosis not present

## 2021-06-26 NOTE — Patient Instructions (Signed)
Gel injections today ?See you again in 3 months ?

## 2021-06-26 NOTE — Assessment & Plan Note (Signed)
Patient given viscosupplementation today and tolerated the procedure well, discussed icing regimen and home exercises.  Continue to monitor weight.  Patient still wants to hold off for any type of surgical intervention.  Follow-up with me again in 3 months ?

## 2021-06-29 DIAGNOSIS — N1831 Chronic kidney disease, stage 3a: Secondary | ICD-10-CM

## 2021-06-29 DIAGNOSIS — E1169 Type 2 diabetes mellitus with other specified complication: Secondary | ICD-10-CM

## 2021-06-29 DIAGNOSIS — E785 Hyperlipidemia, unspecified: Secondary | ICD-10-CM

## 2021-06-29 DIAGNOSIS — E1122 Type 2 diabetes mellitus with diabetic chronic kidney disease: Secondary | ICD-10-CM

## 2021-06-29 DIAGNOSIS — I129 Hypertensive chronic kidney disease with stage 1 through stage 4 chronic kidney disease, or unspecified chronic kidney disease: Secondary | ICD-10-CM

## 2021-07-02 DIAGNOSIS — H25811 Combined forms of age-related cataract, right eye: Secondary | ICD-10-CM | POA: Diagnosis not present

## 2021-07-02 DIAGNOSIS — H25812 Combined forms of age-related cataract, left eye: Secondary | ICD-10-CM | POA: Diagnosis not present

## 2021-07-02 DIAGNOSIS — H401131 Primary open-angle glaucoma, bilateral, mild stage: Secondary | ICD-10-CM | POA: Diagnosis not present

## 2021-07-02 DIAGNOSIS — Z01818 Encounter for other preprocedural examination: Secondary | ICD-10-CM | POA: Diagnosis not present

## 2021-07-03 ENCOUNTER — Ambulatory Visit (INDEPENDENT_AMBULATORY_CARE_PROVIDER_SITE_OTHER): Payer: Medicare Other

## 2021-07-03 DIAGNOSIS — Z13820 Encounter for screening for osteoporosis: Secondary | ICD-10-CM

## 2021-07-03 DIAGNOSIS — Z780 Asymptomatic menopausal state: Secondary | ICD-10-CM

## 2021-07-04 ENCOUNTER — Telehealth: Payer: Self-pay

## 2021-07-04 NOTE — Progress Notes (Signed)
? ? ?Chronic Care Management ?Pharmacy Assistant  ? ?Name: Jade Sullivan  MRN: [id number] DOB: Dec 27, 1945 ? ?Jade Sullivan is an 76 y.o. year old female who was called for her follow-up assessment call. ? ?Reason for Encounter: Disease State ?  ?Conditions to be addressed/monitored: ?DMII ? ? ?Recent office visits:  ?None ID ? ?Recent consult visits:  ?06/26/21 Lyndal Pulley, DO-Sports Medicine (osteoarthritis of both knees) No orders or med changes ? ?Hospital visits:  ?None in previous 6 months ? ?Medications: ?Outpatient Encounter Medications as of 07/04/2021  ?Medication Sig  ? ALPRAZolam (XANAX) 0.25 MG tablet Take 1 tablet (0.25 mg total) by mouth 2 (two) times daily as needed for anxiety (prior to dentist).  ? aspirin EC 81 MG tablet Take 81 mg by mouth every evening. Swallow whole.  ? aspirin-acetaminophen-caffeine (EXCEDRIN MIGRAINE) 250-250-65 MG tablet Take 1-2 tablets by mouth every 6 (six) hours as needed for headache (sinus headaches/pain).  ? atorvastatin (LIPITOR) 40 MG tablet Take 1 tablet (40 mg total) by mouth daily.  ? cholecalciferol (VITAMIN D3) 25 MCG (1000 UT) tablet Take 1,000 Units by mouth daily.  ? cloNIDine (CATAPRES - DOSED IN MG/24 HR) 0.3 mg/24hr patch Place 0.3 mg onto the skin every Wednesday.  ? cloNIDine (CATAPRES - DOSED IN MG/24 HR) 0.3 mg/24hr patch 0.3 mg once a week.  ? cloNIDine (CATAPRES) 0.2 MG tablet Take 0.2 mg by mouth 3 (three) times daily.  ? fish oil-omega-3 fatty acids 1000 MG capsule Take 2 g by mouth daily.  ? gabapentin (NEURONTIN) 100 MG capsule Take 1 capsule (100 mg total) by mouth at bedtime. (Patient taking differently: Take 100 mg by mouth daily as needed.)  ? irbesartan (AVAPRO) 300 MG tablet TAKE 1 TABLET(300 MG) BY MOUTH IN THE MORNING  ? latanoprost (XALATAN) 0.005 % ophthalmic solution Place 1 drop into both eyes at bedtime.   ? levothyroxine (SYNTHROID) 50 MCG tablet Take 1 tablet (50 mcg total) by mouth daily before breakfast.  ? minoxidil  (LONITEN) 2.5 MG tablet Take 5 mg by mouth in the morning, at noon, and at bedtime.  ? Multiple Vitamin (MULTIVITAMIN WITH MINERALS) TABS tablet Take 1 tablet by mouth every evening.  ? nebivolol (BYSTOLIC) 5 MG tablet Take 5 mg by mouth in the morning.  ? spironolactone (ALDACTONE) 100 MG tablet Take 100 mg by mouth every evening.  ? timolol (TIMOPTIC) 0.5 % ophthalmic solution Place 1 drop into both eyes 2 (two) times daily.  ? vitamin B-12 (CYANOCOBALAMIN) 500 MCG tablet Take 1,000 mcg by mouth every evening.  ? ?No facility-administered encounter medications on file as of 07/04/2021.  ? ?Recent Relevant Labs: ?Lab Results  ?Component Value Date/Time  ? HGBA1C 6.9 (H) 05/23/2021 10:58 AM  ? HGBA1C 6.1 (A) 02/14/2021 11:09 AM  ? HGBA1C 6.5 05/17/2020 10:50 AM  ?  ?Kidney Function ?Lab Results  ?Component Value Date/Time  ? CREATININE 1.25 (H) 05/23/2021 10:58 AM  ? CREATININE 0.98 02/14/2021 11:36 AM  ? GFR 42.17 (L) 05/23/2021 10:58 AM  ? GFRNONAA 44 (L) 05/03/2019 12:55 PM  ? GFRAA 51 (L) 05/03/2019 12:55 PM  ? ? ?Current antihyperglycemic regimen:  ?Jardiance 10 mg (Patient states she no longer take because of side effect) ? ?What recent interventions/DTPs have been made to improve glycemic control:  ?None ID ? ?Have there been any recent hospitalizations or ED visits since last visit with CPP? No ? ?Patient denies hypoglycemic symptoms, including None ? ?Patient denies hyperglycemic symptoms, including none ? ?  How often are you checking your blood sugar? once daily ? ?What are your blood sugars ranging?123-164  ?Fasting: 164 this am ? ?During the week, how often does your blood glucose drop below 70? Never ? ?Are you checking your feet daily/regularly? Patient states that she has had some swelling in feet since she has been off the Jardiance ? ?Adherence Review: ?Is the patient currently on a STATIN medication? Yes ?Is the patient currently on ACE/ARB medication? Yes ?Does the patient have >5 day gap between  last estimated fill dates? No ? ?Care Gaps: ?Colonoscopy-06/27/20 ?Diabetic Foot Exam-NA ?Mammogram-NA ?Ophthalmology-05/08/20 ?Dexa Scan -NA  ?Annual Well Visit -NA ?Micro albumin-NA ?Hemoglobin A1c-05/23/21 ?  ?Star Rating Drugs: ?Atorvastatin 40 mg-last fill 07/02/21 90 ds  ?Irbesartan 300 mg-last fill 05/02/21 90 ds ?  ?Ethelene Hal ?Clinical Pharmacist Assistant ?[phone]  ?

## 2021-07-09 NOTE — Progress Notes (Signed)
Patient stated that she would like for Dr. [name] to call [facility] so that they can come in and get their equipment because she has to pay for it each month and she does not use it. ? ? ?Ethelene Hal ?Clinical Pharmacist Assistant ?[phone]  ?

## 2021-07-17 ENCOUNTER — Telehealth: Payer: Medicare Other

## 2021-07-20 DIAGNOSIS — U071 COVID-19: Secondary | ICD-10-CM | POA: Diagnosis not present

## 2021-07-25 ENCOUNTER — Encounter: Payer: Self-pay | Admitting: Podiatry

## 2021-07-25 ENCOUNTER — Ambulatory Visit (INDEPENDENT_AMBULATORY_CARE_PROVIDER_SITE_OTHER): Payer: Medicare Other | Admitting: Podiatry

## 2021-07-25 ENCOUNTER — Telehealth: Payer: Self-pay | Admitting: Internal Medicine

## 2021-07-25 DIAGNOSIS — B351 Tinea unguium: Secondary | ICD-10-CM

## 2021-07-25 DIAGNOSIS — M79675 Pain in left toe(s): Secondary | ICD-10-CM | POA: Diagnosis not present

## 2021-07-25 DIAGNOSIS — E1122 Type 2 diabetes mellitus with diabetic chronic kidney disease: Secondary | ICD-10-CM

## 2021-07-25 DIAGNOSIS — N1831 Chronic kidney disease, stage 3a: Secondary | ICD-10-CM | POA: Diagnosis not present

## 2021-07-25 DIAGNOSIS — Q828 Other specified congenital malformations of skin: Secondary | ICD-10-CM

## 2021-07-25 DIAGNOSIS — M79674 Pain in right toe(s): Secondary | ICD-10-CM | POA: Diagnosis not present

## 2021-07-25 MED ORDER — IRBESARTAN 300 MG PO TABS: ORAL_TABLET | ORAL | 0 refills | Status: DC

## 2021-07-25 NOTE — Telephone Encounter (Signed)
1.Medication Requested: irbesartan (AVAPRO) 300 MG tablet ? ?2. Pharmacy (Name, [address], [place]): [facility] [phone] - [place], [address] ? ?3. On Med List: Y ? ?4. Last Visit with PCP: 05-23-2021 ? ?5. Next visit date with PCP: n/a ? ? ?Agent: Please be advised that RX refills may take up to 3 business days. We ask that you follow-up with your pharmacy.  ?

## 2021-07-25 NOTE — Progress Notes (Signed)
This patient returns to my office for at risk foot care.  This patient requires this care by a professional since this patient will be at risk due to having diabetes and CKD.  This patient is unable to cut nails herself since the patient cannot reach hernails.These nails are painful walking and wearing shoes.  She also has a painful callus each forefoot.This patient presents for at risk foot care today.  General Appearance  Alert, conversant and in no acute stress.  Vascular  Dorsalis pedis and posterior tibial  pulses are palpable  bilaterally.  Capillary return is within normal limits  bilaterally. Temperature is within normal limits  bilaterally.  Neurologic  Senn-Weinstein monofilament wire test within normal limits  bilaterally. Muscle power within normal limits bilaterally.  Nails Thick disfigured discolored nails with subungual debris  from hallux to fifth toes bilaterally. No evidence of bacterial infection or drainage bilaterally.  Orthopedic  No limitations of motion  feet .  No crepitus or effusions noted.  No bony pathology or digital deformities noted.  Skin  normotropic skin noted bilaterally.  No signs of infections or ulcers noted.   Porokeratosis sub 3 left foot and sub 4 right foot.  Onychomycosis  Pain in right toes  Pain in left toes  Consent was obtained for treatment procedures.   Mechanical debridement of nails 1-5  bilaterally performed with a nail nipper.  Filed with dremel without incident.    Return office visit   3 months                   Told patient to return for periodic foot care and evaluation due to potential at risk complications.   Sabina Beavers DPM   

## 2021-07-25 NOTE — Telephone Encounter (Signed)
Refill has been sent to the pt's pharmacy  

## 2021-07-26 DIAGNOSIS — U071 COVID-19: Secondary | ICD-10-CM | POA: Diagnosis not present

## 2021-07-30 ENCOUNTER — Telehealth: Payer: Self-pay | Admitting: Family Medicine

## 2021-07-30 NOTE — Telephone Encounter (Signed)
Patient called stating that she received a bill for the past gel injections she received. She contacted her insurance because last time, she did not have to pay anything. They told her that it could potentially be due to the coding? Is this something you can help me with? ? ? ?

## 2021-07-31 NOTE — Telephone Encounter (Signed)
Spoke to pt, she stated that she was able to speak with someone in billing yesterday & they explained to her that the charges from 2022 was still pending. The OV in 05/2021, her insurance paid all but $163. So pt is waiting to hear back from insurance on the 2022 OV for Durolane.  ?

## 2021-08-19 DIAGNOSIS — U071 COVID-19: Secondary | ICD-10-CM | POA: Diagnosis not present

## 2021-08-25 DIAGNOSIS — U071 COVID-19: Secondary | ICD-10-CM | POA: Diagnosis not present

## 2021-08-30 DIAGNOSIS — H269 Unspecified cataract: Secondary | ICD-10-CM | POA: Diagnosis not present

## 2021-08-30 DIAGNOSIS — H409 Unspecified glaucoma: Secondary | ICD-10-CM | POA: Diagnosis not present

## 2021-08-30 DIAGNOSIS — H401111 Primary open-angle glaucoma, right eye, mild stage: Secondary | ICD-10-CM | POA: Diagnosis not present

## 2021-08-30 DIAGNOSIS — H25811 Combined forms of age-related cataract, right eye: Secondary | ICD-10-CM | POA: Diagnosis not present

## 2021-08-30 DIAGNOSIS — H401121 Primary open-angle glaucoma, left eye, mild stage: Secondary | ICD-10-CM | POA: Diagnosis not present

## 2021-09-11 ENCOUNTER — Other Ambulatory Visit: Payer: Self-pay

## 2021-09-11 DIAGNOSIS — K862 Cyst of pancreas: Secondary | ICD-10-CM

## 2021-09-18 ENCOUNTER — Telehealth: Payer: Self-pay | Admitting: Internal Medicine

## 2021-09-18 ENCOUNTER — Other Ambulatory Visit: Payer: Self-pay

## 2021-09-18 MED ORDER — DIAZEPAM 5 MG PO TABS: ORAL_TABLET | ORAL | 0 refills | Status: DC

## 2021-09-18 NOTE — Telephone Encounter (Signed)
Prescription called to [facility], pt aware.

## 2021-09-18 NOTE — Telephone Encounter (Signed)
Patient called stating she is scheduled to have a MRI on Friday 6/23 at 5:00. Patient is requesting a call back to discuss if she can get a medication to help her relax. Please advise.

## 2021-09-18 NOTE — Telephone Encounter (Signed)
Pyrtle pt calling, scheduled for MRI on Friday. She is requesting something to help  her relax prior to having the scan. As DOD please advise.

## 2021-09-21 ENCOUNTER — Ambulatory Visit (HOSPITAL_COMMUNITY)
Admission: RE | Admit: 2021-09-21 | Discharge: 2021-09-21 | Disposition: A | Discharge status: Home / Self Care | Payer: Medicare Other | Source: Ambulatory Visit | Attending: Internal Medicine | Admitting: Internal Medicine

## 2021-09-21 ENCOUNTER — Other Ambulatory Visit: Payer: Self-pay | Admitting: Internal Medicine

## 2021-09-21 DIAGNOSIS — N281 Cyst of kidney, acquired: Secondary | ICD-10-CM | POA: Diagnosis not present

## 2021-09-21 DIAGNOSIS — K862 Cyst of pancreas: Secondary | ICD-10-CM | POA: Diagnosis not present

## 2021-09-21 DIAGNOSIS — K769 Liver disease, unspecified: Secondary | ICD-10-CM | POA: Diagnosis not present

## 2021-09-21 DIAGNOSIS — K5730 Diverticulosis of large intestine without perforation or abscess without bleeding: Secondary | ICD-10-CM | POA: Diagnosis not present

## 2021-09-21 MED ORDER — GADOBUTROL 1 MMOL/ML IV SOLN
9.0000 mL | Freq: Once | INTRAVENOUS | Status: AC | PRN
  Administered 2021-09-21: 9 mL via INTRAVENOUS

## 2021-09-24 ENCOUNTER — Encounter: Payer: Self-pay | Admitting: Internal Medicine

## 2021-09-24 ENCOUNTER — Ambulatory Visit: Payer: Medicare Other | Admitting: Internal Medicine

## 2021-09-24 VITALS — BP 136/78 | HR 63 | Ht 64.0 in | Wt 201.8 lb

## 2021-09-24 DIAGNOSIS — E1169 Type 2 diabetes mellitus with other specified complication: Secondary | ICD-10-CM

## 2021-09-24 DIAGNOSIS — E1159 Type 2 diabetes mellitus with other circulatory complications: Secondary | ICD-10-CM | POA: Diagnosis not present

## 2021-09-24 DIAGNOSIS — I152 Hypertension secondary to endocrine disorders: Secondary | ICD-10-CM | POA: Diagnosis not present

## 2021-09-24 DIAGNOSIS — E785 Hyperlipidemia, unspecified: Secondary | ICD-10-CM | POA: Diagnosis not present

## 2021-09-24 DIAGNOSIS — E119 Type 2 diabetes mellitus without complications: Secondary | ICD-10-CM

## 2021-09-24 DIAGNOSIS — N1831 Chronic kidney disease, stage 3a: Secondary | ICD-10-CM | POA: Diagnosis not present

## 2021-09-24 DIAGNOSIS — I700 Atherosclerosis of aorta: Secondary | ICD-10-CM | POA: Diagnosis not present

## 2021-09-24 MED ORDER — ATORVASTATIN CALCIUM 80 MG PO TABS: 80.0000 mg | ORAL_TABLET | Freq: Every day | ORAL | 3 refills | Status: DC

## 2021-09-25 NOTE — Progress Notes (Signed)
[place] Phone: [phone] Subjective:   [name], am serving as a scribe for Dr. Hulan Saas.   I'm seeing this patient by the request  of:  Hoyt Koch, MD  CC: Bilateral knee pain  [id number]  06/26/2021 Patient given viscosupplementation today and tolerated the procedure well, discussed icing regimen and home exercises.  Continue to monitor weight.  Patient still wants to hold off for any type of surgical intervention.  Follow-up with me again in 3 months  Update 09/26/2021 Jade Sullivan is a 76 y.o. female coming in with complaint of B knee pain. Patient states that she has been doing well. A few days ago she started to feel pain in lateral hamstring. Pain has subsided today. Feels gel is working well.     Past Medical History:  Diagnosis Date   Allergy    seasonal allergies   Arthritis    bilateral knees   Cataract    not a surgical candidate at this time (05/29/2020)   Chronic kidney disease    COVID-19 04/2019   Diabetes mellitus without complication ([place])    on meds   Diverticulosis of colon (without mention of hemorrhage)    Dysmetabolic syndrome X    Glaucoma    on meds   Gout    H/O hyperthyroidism    By patient h/o hyperthyroidism with protosis, s/p subtotal thyroidectomy. On no replacement therapy. Last thyroid functions 2013 in [place]  Plan Will need thyroid functions Feb '15 with recommendations to follow.     Heart murmur    hx of   Hyperlipidemia    on meds   Hypertension    on multiple meds   Hypothyroidism    on meds   Kidney lesion    On supplemental oxygen by nasal cannula    at bedtime   Pancreatic cyst    Tubular adenoma of colon    Past Surgical History:  Procedure Laterality Date   BIOPSY  06/27/2020   Procedure: BIOPSY;  Surgeon: Jerene Bears, MD;  Location: [facility];  Service: Gastroenterology;;   [place]  2016   JMP-MAC-2 day suprep (good)-TICS/TA   COLONOSCOPY WITH PROPOFOL N/A 06/27/2020   Procedure: COLONOSCOPY WITH PROPOFOL;  Surgeon: Jerene Bears, MD;  Location: [facility];  Service: Gastroenterology;  Laterality: N/A;   EYE SURGERY     LIPOMA EXCISION Right 11/24/2014   Procedure: EXCISION RIGHT SHOULDER LIPOMA;  Surgeon: Erroll Luna, MD;  Location: [facility];  Service: General;  Laterality: Right;   POLYPECTOMY  2016   TA   POLYPECTOMY  06/27/2020   Procedure: POLYPECTOMY;  Surgeon: Jerene Bears, MD;  Location: [facility];  Service: Gastroenterology;;   THYROIDECTOMY, PARTIAL  1981   TONSILLECTOMY     UMBILICAL HERNIA REPAIR  2000   WISDOM TOOTH EXTRACTION     Social History   Socioeconomic History   Marital status: Divorced    Spouse name: Not on file   Number of children: 1   Years of education: Not on file   Highest education level: Not on file  Occupational History   Occupation: Retired  Tobacco Use   Smoking status: Former   Smokeless tobacco: Never  Scientific laboratory technician Use: Never used  Substance and Sexual Activity   Alcohol use: No    Alcohol/week: 0.0 standard drinks of alcohol  Drug use: No   Sexual activity: Not Currently    Comment: 1st intercourse- 16, partners- 5, divorced  Other Topics Concern   Not on file  Social History Narrative   Divorced   Retired - Engineer, maintenance (IT)   Social Determinants of Health   Financial Resource Strain: [place]  (10/03/2020)   Overall Financial Resource Strain (CARDIA)    Difficulty of Paying Living Expenses: Not hard at all  Food Insecurity: No Food Insecurity (10/03/2020)   Hunger Vital Sign    Worried About Running Out of Food in the Last Year: Never true    [place] in the Last Year: Never true  Transportation Needs: No Transportation Needs (10/03/2020)   PRAPARE - Hydrologist (Medical): No    Lack of Transportation (Non-Medical): No  Physical  Activity: Insufficiently Active (10/03/2020)   Exercise Vital Sign    Days of Exercise per Week: 7 days    Minutes of Exercise per Session: 20 min  Stress: No Stress Concern Present (10/03/2020)   [place]    Feeling of Stress : Not at all  Social Connections: Moderately Integrated (10/03/2020)   Social Connection and Isolation Panel [NHANES]    Frequency of Communication with Friends and Family: More than three times a week    Frequency of Social Gatherings with Friends and Family: Twice a week    Attends Religious Services: More than 4 times per year    Active Member of [employer] or Organizations: Yes    Attends Music therapist: More than 4 times per year    Marital Status: Divorced   Allergies  Allergen Reactions   Amlodipine     Gingival hyperplasia   Hctz [Hydrochlorothiazide] Other (See Comments)    gout   Jardiance [Empagliflozin] Other (See Comments)    Yeast Infections   Family History  Problem Relation Age of Onset   Diabetes Mother        DM2   Hypertension Mother    Pneumonia Mother        died from this condition   Colon polyps Mother    Heart disease Father        died from this condition   Colon polyps Father    Kidney disease Sister        HBP and DM2; relative died from this condition   Colon polyps Sister    Heart disease Brother        MI - HBP and DM2   Colon polyps Brother    Colon polyps Brother    Colon cancer Neg Hx    Esophageal cancer Neg Hx    Rectal cancer Neg Hx    Stomach cancer Neg Hx     Current Outpatient Medications (Endocrine & Metabolic):    levothyroxine (SYNTHROID) 50 MCG tablet, Take 1 tablet (50 mcg total) by mouth daily before breakfast.  Current Outpatient Medications (Cardiovascular):    atorvastatin (LIPITOR) 80 MG tablet, Take 1 tablet (80 mg total) by mouth daily.   cloNIDine (CATAPRES - DOSED IN MG/24 HR) 0.3 mg/24hr patch, Place 0.3 mg onto the  skin every Wednesday.   cloNIDine (CATAPRES - DOSED IN MG/24 HR) 0.3 mg/24hr patch, 0.3 mg once a week.   cloNIDine (CATAPRES) 0.2 MG tablet, Take 0.2 mg by mouth 3 (three) times daily.   irbesartan (AVAPRO) 300 MG tablet, TAKE 1 TABLET(300 MG) BY MOUTH IN THE  MORNING   minoxidil (LONITEN) 2.5 MG tablet, Take 5 mg by mouth in the morning, at noon, and at bedtime.   nebivolol (BYSTOLIC) 5 MG tablet, Take 5 mg by mouth in the morning.   spironolactone (ALDACTONE) 100 MG tablet, Take 100 mg by mouth every evening.   Current Outpatient Medications (Analgesics):    aspirin EC 81 MG tablet, Take 81 mg by mouth every evening. Swallow whole.   aspirin-acetaminophen-caffeine (EXCEDRIN MIGRAINE) 250-250-65 MG tablet, Take 1-2 tablets by mouth every 6 (six) hours as needed for headache (sinus headaches/pain).  Current Outpatient Medications (Hematological):    vitamin B-12 (CYANOCOBALAMIN) 500 MCG tablet, Take 1,000 mcg by mouth every evening.  Current Outpatient Medications (Other):    ALPRAZolam (XANAX) 0.25 MG tablet, Take 1 tablet (0.25 mg total) by mouth 2 (two) times daily as needed for anxiety (prior to dentist).   cholecalciferol (VITAMIN D3) 25 MCG (1000 UT) tablet, Take 1,000 Units by mouth daily.   diazepam (VALIUM) 5 MG tablet, Take 1 tablet an hour prior to MRI and may repeat if needed   fish oil-omega-3 fatty acids 1000 MG capsule, Take 2 g by mouth daily.   gabapentin (NEURONTIN) 100 MG capsule, Take 1 capsule (100 mg total) by mouth at bedtime. (Patient taking differently: Take 100 mg by mouth daily as needed.)   latanoprost (XALATAN) 0.005 % ophthalmic solution, Place 1 drop into both eyes at bedtime.    Multiple Vitamin (MULTIVITAMIN WITH MINERALS) TABS tablet, Take 1 tablet by mouth every evening.   timolol (TIMOPTIC) 0.5 % ophthalmic solution, Place 1 drop into both eyes 2 (two) times daily.   Objective  Blood pressure (!) 148/84, pulse 67, height _0  (1.626 m), weight 204 lb  (92.5 kg), SpO2 98 %.   General: No apparent distress alert and oriented x3 mood and affect normal, dressed appropriately.  HEENT: Pupils equal, extraocular movements intact  Respiratory: Patient's speak in full sentences and does not appear short of breath  Cardiovascular: No lower extremity edema, non tender, no erythema  Knee exam seems to have some mild tenderness to palpation mostly over the medial joint space.  No significant instability noted noted at the moment.  Patient does have crepitus.    Impression and Recommendations:     The above documentation has been reviewed and is accurate and complete Lyndal Pulley, DO

## 2021-09-26 ENCOUNTER — Ambulatory Visit: Payer: Medicare Other | Admitting: Family Medicine

## 2021-09-26 ENCOUNTER — Encounter: Payer: Self-pay | Admitting: Family Medicine

## 2021-09-26 DIAGNOSIS — M170 Bilateral primary osteoarthritis of knee: Secondary | ICD-10-CM | POA: Diagnosis not present

## 2021-09-26 NOTE — Assessment & Plan Note (Signed)
Patient is doing much better with the viscosupplementation.  Discussed with patient that if needed we will repeat we can always do it again every 6 months.  Patient is going to make no other changes and follow-up with me again in 3 months.

## 2021-09-26 NOTE — Patient Instructions (Signed)
Good to see you Glad you are doing well See me in 3 months

## 2021-10-04 DIAGNOSIS — H269 Unspecified cataract: Secondary | ICD-10-CM | POA: Diagnosis not present

## 2021-10-04 DIAGNOSIS — H25812 Combined forms of age-related cataract, left eye: Secondary | ICD-10-CM | POA: Diagnosis not present

## 2021-10-04 DIAGNOSIS — H409 Unspecified glaucoma: Secondary | ICD-10-CM | POA: Diagnosis not present

## 2021-10-04 DIAGNOSIS — H401121 Primary open-angle glaucoma, left eye, mild stage: Secondary | ICD-10-CM | POA: Diagnosis not present

## 2021-10-05 ENCOUNTER — Telehealth: Payer: Self-pay | Admitting: Internal Medicine

## 2021-10-05 NOTE — Telephone Encounter (Signed)
PT's son visits today with a form to be filled out by Dr.Crawford. Form has been left in provider's mailbox. PT would like to be notified once the form is ready   CB: [phone]

## 2021-10-05 NOTE — Telephone Encounter (Signed)
Scratch that. PT's son came back up and let me know it was actually Dr.[name] that needed to fill this form out. From was given back to PT's son and he delivered it downstairs.

## 2021-10-08 NOTE — Telephone Encounter (Signed)
Noted  

## 2021-10-10 ENCOUNTER — Ambulatory Visit (INDEPENDENT_AMBULATORY_CARE_PROVIDER_SITE_OTHER): Payer: Medicare Other

## 2021-10-10 DIAGNOSIS — Z0000 Encounter for general adult medical examination without abnormal findings: Secondary | ICD-10-CM | POA: Diagnosis not present

## 2021-10-10 NOTE — Progress Notes (Addendum)
I connected with Tyrone Sage today by telephone and verified that I am speaking with the correct person using two identifiers. Location patient: home Location provider: work Persons participating in the virtual visit: patient, provider.   I discussed the limitations, risks, security and privacy concerns of performing an evaluation and management service by telephone and the availability of in person appointments. I also discussed with the patient that there may be a patient responsible charge related to this service. The patient expressed understanding and verbally consented to this telephonic visit.    Interactive audio and video telecommunications were attempted between this provider and patient, however failed, due to patient having technical difficulties OR patient did not have access to video capability.  We continued and completed visit with audio only.  Some vital signs may be absent or patient reported.   Time Spent with patient on telephone encounter: 30 minutes  Subjective:   Jade Sullivan is a 76 y.o. female who presents for Medicare Annual (Subsequent) preventive examination.  Review of Systems     Cardiac Risk Factors include: advanced age (>14mn, >>63women);diabetes mellitus;dyslipidemia;family history of premature cardiovascular disease;hypertension;obesity (BMI >30kg/m2)     Objective:    There were no vitals filed for this visit. There is no height or weight on file to calculate BMI.     10/10/2021    2:06 PM 10/03/2020    3:46 PM 06/27/2020   10:31 AM 06/25/2019   12:07 PM 04/14/2019   12:00 PM 04/13/2019    9:28 PM 05/12/2018   11:13 AM  Advanced Directives  Does Patient Have a Medical Advance Directive? _0  No No  Does patient want to make changes to medical advance directive?  No - Patient declined       Would patient like information on creating a medical advance directive? No - Patient declined  No - Patient declined No - Patient declined No -  Patient declined No - Patient declined No - Patient declined    Current Medications (verified) Outpatient Encounter Medications as of 10/10/2021  Medication Sig   ALPRAZolam (XANAX) 0.25 MG tablet Take 1 tablet (0.25 mg total) by mouth 2 (two) times daily as needed for anxiety (prior to dentist).   aspirin EC 81 MG tablet Take 81 mg by mouth every evening. Swallow whole.   aspirin-acetaminophen-caffeine (EXCEDRIN MIGRAINE) 250-250-65 MG tablet Take 1-2 tablets by mouth every 6 (six) hours as needed for headache (sinus headaches/pain).   atorvastatin (LIPITOR) 80 MG tablet Take 1 tablet (80 mg total) by mouth daily.   cholecalciferol (VITAMIN D3) 25 MCG (1000 UT) tablet Take 1,000 Units by mouth daily.   cloNIDine (CATAPRES - DOSED IN MG/24 HR) 0.3 mg/24hr patch Place 0.3 mg onto the skin every Wednesday.   cloNIDine (CATAPRES - DOSED IN MG/24 HR) 0.3 mg/24hr patch 0.3 mg once a week.   cloNIDine (CATAPRES) 0.2 MG tablet Take 0.2 mg by mouth 3 (three) times daily.   diazepam (VALIUM) 5 MG tablet Take 1 tablet an hour prior to MRI and may repeat if needed   fish oil-omega-3 fatty acids 1000 MG capsule Take 2 g by mouth daily.   gabapentin (NEURONTIN) 100 MG capsule Take 1 capsule (100 mg total) by mouth at bedtime. (Patient taking differently: Take 100 mg by mouth daily as needed.)   irbesartan (AVAPRO) 300 MG tablet TAKE 1 TABLET(300 MG) BY MOUTH IN THE MORNING   latanoprost (XALATAN) 0.005 % ophthalmic solution Place 1 drop into both  eyes at bedtime.    levothyroxine (SYNTHROID) 50 MCG tablet Take 1 tablet (50 mcg total) by mouth daily before breakfast.   minoxidil (LONITEN) 2.5 MG tablet Take 5 mg by mouth in the morning, at noon, and at bedtime.   Multiple Vitamin (MULTIVITAMIN WITH MINERALS) TABS tablet Take 1 tablet by mouth every evening.   nebivolol (BYSTOLIC) 5 MG tablet Take 5 mg by mouth in the morning.   spironolactone (ALDACTONE) 100 MG tablet Take 100 mg by mouth every evening.    timolol (TIMOPTIC) 0.5 % ophthalmic solution Place 1 drop into both eyes 2 (two) times daily.   vitamin B-12 (CYANOCOBALAMIN) 500 MCG tablet Take 1,000 mcg by mouth every evening.   No facility-administered encounter medications on file as of 10/10/2021.    Allergies (verified) Amlodipine, Hctz [hydrochlorothiazide], and Jardiance [empagliflozin]   History: Past Medical History:  Diagnosis Date   Allergy    seasonal allergies   Arthritis    bilateral knees   Cataract    not a surgical candidate at this time (05/29/2020)   Chronic kidney disease    COVID-19 04/2019   Diabetes mellitus without complication ([place])    on meds   Diverticulosis of colon (without mention of hemorrhage)    Dysmetabolic syndrome X    Glaucoma    on meds   Gout    H/O hyperthyroidism    By patient h/o hyperthyroidism with protosis, s/p subtotal thyroidectomy. On no replacement therapy. Last thyroid functions 2013 in [place]  Plan Will need thyroid functions Feb '15 with recommendations to follow.     Heart murmur    hx of   Hyperlipidemia    on meds   Hypertension    on multiple meds   Hypothyroidism    on meds   Kidney lesion    On supplemental oxygen by nasal cannula    at bedtime   Pancreatic cyst    Tubular adenoma of colon    Past Surgical History:  Procedure Laterality Date   BIOPSY  06/27/2020   Procedure: BIOPSY;  Surgeon: Jerene Bears, MD;  Location: [facility];  Service: Gastroenterology;;   [place]  2016   JMP-MAC-2 day suprep (good)-TICS/TA   COLONOSCOPY WITH PROPOFOL N/A 06/27/2020   Procedure: COLONOSCOPY WITH PROPOFOL;  Surgeon: Jerene Bears, MD;  Location: [facility];  Service: Gastroenterology;  Laterality: N/A;   EYE SURGERY     LIPOMA EXCISION Right 11/24/2014   Procedure: EXCISION RIGHT SHOULDER LIPOMA;  Surgeon: Erroll Luna, MD;  Location: [facility];  Service: General;  Laterality: Right;   POLYPECTOMY  2016   TA    POLYPECTOMY  06/27/2020   Procedure: POLYPECTOMY;  Surgeon: Jerene Bears, MD;  Location: [facility];  Service: Gastroenterology;;   THYROIDECTOMY, PARTIAL  1981   TONSILLECTOMY     UMBILICAL HERNIA REPAIR  2000   WISDOM TOOTH EXTRACTION     Family History  Problem Relation Age of Onset   Diabetes Mother        DM2   Hypertension Mother    Pneumonia Mother        died from this condition   Colon polyps Mother    Heart disease Father        died from this condition   Colon polyps Father    Kidney disease Sister        HBP and DM2; relative died from this condition   Colon polyps Sister  Heart disease Brother        MI - HBP and DM2   Colon polyps Brother    Colon polyps Brother    Colon cancer Neg Hx    Esophageal cancer Neg Hx    Rectal cancer Neg Hx    Stomach cancer Neg Hx    Social History   Socioeconomic History   Marital status: Divorced    Spouse name: Not on file   Number of children: 1   Years of education: Not on file   Highest education level: Not on file  Occupational History   Occupation: Retired  Tobacco Use   Smoking status: Former   Smokeless tobacco: Never  Scientific laboratory technician Use: Never used  Substance and Sexual Activity   Alcohol use: No    Alcohol/week: 0.0 standard drinks of alcohol   Drug use: No   Sexual activity: Not Currently    Comment: 1st intercourse- 16, partners- 3, divorced  Other Topics Concern   Not on file  Social History Narrative   Divorced   Retired - Engineer, maintenance (IT)   Social Determinants of Health   Financial Resource Strain: Low Risk  (10/10/2021)   Overall Financial Resource Strain (CARDIA)    Difficulty of Paying Living Expenses: Not hard at all  Food Insecurity: No Food Insecurity (10/10/2021)   Hunger Vital Sign    Worried About Running Out of Food in the Last Year: Never true    Ran Out of Food in the Last Year: Never true  Transportation Needs: No Transportation Needs (10/10/2021)   PRAPARE -  Hydrologist (Medical): No    Lack of Transportation (Non-Medical): No  Physical Activity: Sufficiently Active (10/10/2021)   Exercise Vital Sign    Days of Exercise per Week: 5 days    Minutes of Exercise per Session: 30 min  Stress: No Stress Concern Present (10/10/2021)   [place]    Feeling of Stress : Not at all  Social Connections: Moderately Integrated (10/10/2021)   Social Connection and Isolation Panel [NHANES]    Frequency of Communication with Friends and Family: More than three times a week    Frequency of Social Gatherings with Friends and Family: Twice a week    Attends Religious Services: More than 4 times per year    Active Member of [employer] or Organizations: Yes    Attends Music therapist: More than 4 times per year    Marital Status: Divorced    Tobacco Counseling Counseling given: Not Answered   Clinical Intake:  Pre-visit preparation completed: Yes  Pain : No/denies pain     BMI - recorded: 35.02 Nutritional Status: BMI > 30  Obese Nutritional Risks: None  How often do you need to have someone help you when you read instructions, pamphlets, or other written materials from your doctor or pharmacy?: 1 - Never What is the last grade level you completed in school?: HS[employer]; [employer]  Diabetic? yes  Interpreter Needed?: No  Information entered by :: Lisette Abu, LPN   Activities of Daily Living    10/10/2021    2:17 PM  In your present state of health, do you have any difficulty performing the following activities:  Hearing? 0  Vision? 0  Difficulty concentrating or making decisions? 0  Walking or climbing stairs? 0  Dressing or bathing? 0  Doing errands, shopping? 0  Preparing Food and eating ?  N  Using the Toilet? N  In the past six months, have you accidently leaked urine? N  Do you have problems with loss of bowel control? N   Managing your Medications? N  Managing your Finances? N  Housekeeping or managing your Housekeeping? N    Patient Care Team: Hoyt Koch, MD as PCP - General (Internal Medicine) Early Osmond, MD as PCP - Cardiology (Cardiology) Princess Bruins, MD as Consulting Physician (Obstetrics and Gynecology) Hilarie Fredrickson, Lajuan Lines, MD as Consulting Physician (Gastroenterology) Corliss Parish, MD as Consulting Physician (Nephrology) [facility] (Ophthalmology) Alanda Slim, Neena Rhymes, MD as Consulting Physician (Ophthalmology) Szabat, Darnelle Maffucci, [facility] (Inactive) (Pharmacist)  Indicate any recent Medical Services you may have received from other than [facility] providers in the past year (date may be approximate).     Assessment:   This is a routine wellness examination for Jade Sullivan.  Hearing/Vision screen Hearing Screening - Comments:: Patient denied any hearing difficulty.   No hearing aids.  Vision Screening - Comments:: Patient does not wear any corrective lenses/contacts.   Eye exam done by: Julian Reil, MD.   Dietary issues and exercise activities discussed: Current Exercise Habits: Home exercise routine, Type of exercise: walking, Time (Minutes): 30, Frequency (Times/Week): 5, Weekly Exercise (Minutes/Week): 150, Intensity: Mild, Exercise limited by: orthopedic condition(s)   Goals Addressed             This Visit's Progress    To lose 20 pounds.        Depression Screen    10/10/2021    2:14 PM 10/03/2020    4:24 PM 08/14/2020   10:38 AM 06/25/2019   12:09 PM 06/11/2019    9:50 AM 05/25/2019    8:23 AM 05/12/2018   11:13 AM  PHQ 2/9 Scores  PHQ - 2 Score 0 0 0 0 0 0 0    Fall Risk    10/10/2021    2:07 PM 08/14/2020   10:38 AM 06/25/2019   12:08 PM 06/11/2019    9:49 AM 05/25/2019    8:23 AM  Fall Risk   Falls in the past year? 0 0 0 0 0  Number falls in past yr: 0 0 0 0   Injury with Fall? 0  0 0   Risk for fall due to : No Fall Risks No Fall  Risks No Fall Risks  Impaired mobility  Follow up Falls evaluation completed Falls evaluation completed Falls evaluation completed;Falls prevention discussed  Falls evaluation completed    FALL RISK PREVENTION PERTAINING TO THE HOME:  Any stairs in or around the home? No  If so, are there any without handrails? No  Home free of loose throw rugs in walkways, pet beds, electrical cords, etc? Yes  Adequate lighting in your home to reduce risk of falls? Yes   ASSISTIVE DEVICES UTILIZED TO PREVENT FALLS:  Life alert? No  Use of a cane, walker or w/c? No  Grab bars in the bathroom? Yes  Shower chair or bench in shower? Yes  Elevated toilet seat or a handicapped toilet? Yes   TIMED UP AND GO:  Was the test performed? No .  Length of time to ambulate 10 feet: n/a sec.   Appearance of gait: Gait not evaluated during this visit.  Cognitive Function:        10/10/2021    2:18 PM 06/25/2019   12:15 PM  6CIT Screen  What Year? 0 points 0 points  What month? 0 points 0 points  What time? 0 points 0 points  Count back from 20 0 points 0 points  Months in reverse 0 points 0 points  Repeat phrase 0 points 0 points  Total Score 0 points 0 points    Immunizations Immunization History  Administered Date(s) Administered   Fluad Quad(high Dose 65+) 03/07/2020, 12/13/2020   Hepb-cpg 09/06/2019, 10/08/2019   Influenza Split 01/22/2012   Influenza, High Dose Seasonal PF 03/17/2017   Influenza,inj,Quad PF,6+ Mos 01/21/2016, 01/25/2018, 01/15/2019   Influenza-Unspecified 12/30/2012   PFIZER(Purple Top)SARS-COV-2 Vaccination 08/25/2019, 09/15/2019, 03/26/2020   Pfizer Covid-19 Vaccine Bivalent Booster 1yr & up 03/26/2021   Pneumococcal Conjugate-13 08/29/2015   Pneumococcal Polysaccharide-23 01/22/2012   Td 09/06/2013    TDAP status: Up to date  Flu Vaccine status: Up to date  Pneumococcal vaccine status: Up to date  Covid-19 vaccine status: Completed vaccines  Qualifies for  Shingles Vaccine? Yes   Zostavax completed No   Shingrix Completed?: No.    Education has been provided regarding the importance of this vaccine. Patient has been advised to call insurance company to determine out of pocket expense if they have not yet received this vaccine. Advised may also receive vaccine at local pharmacy or Health Dept. Verbalized acceptance and understanding.  Screening Tests Health Maintenance  Topic Date Due   Diabetic kidney evaluation - Urine ACR  Never done   Zoster Vaccines- Shingrix (1 of 2) Never done   OPHTHALMOLOGY EXAM  05/08/2021   COVID-19 Vaccine (5 - Pfizer series) 07/25/2021   INFLUENZA VACCINE  10/30/2021   HEMOGLOBIN A1C  11/20/2021   Diabetic kidney evaluation - GFR measurement  05/23/2022   FOOT EXAM  07/26/2022   TETANUS/TDAP  09/07/2023   COLONOSCOPY (Pts 45-476yrInsurance coverage will need to be confirmed)  06/27/2025   Pneumonia Vaccine 6558Years old  Completed   DEXA SCAN  Completed   Hepatitis C Screening  Completed   HPV VACCINES  Aged Out    Health Maintenance  Health Maintenance Due  Topic Date Due   Diabetic kidney evaluation - Urine ACR  Never done   Zoster Vaccines- Shingrix (1 of 2) Never done   OPHTHALMOLOGY EXAM  05/08/2021   COVID-19 Vaccine (5 - Pfizer series) 07/25/2021    Colorectal cancer screening: Type of screening: Colonoscopy. Completed 06/27/2020. Repeat every 5 years  Mammogram status: Completed 01/12/2021. Repeat every year  Bone Density status: Completed 07/03/2021. Results reflect: Bone density results: NORMAL. Repeat every 5 years.  Lung Cancer Screening: (Low Dose CT Chest recommended if Age 76-80ears, 30 pack-year currently smoking OR have quit w/in 15years.) does not qualify.   Lung Cancer Screening Referral: no  Additional Screening:  Hepatitis C Screening: does qualify; Completed 08/25/2019  Vision Screening: Recommended annual ophthalmology exams for early detection of glaucoma and other  disorders of the eye. Is the patient up to date with their annual eye exam?  Yes  Who is the provider or what is the name of the office in which the patient attends annual eye exams? [facility]il[facility]. If pt is not established with a provider, would they like to be referred to a provider to establish care? No .   Dental Screening: Recommended annual dental exams for proper oral hygiene  Community Resource Referral / Chronic Care Management: CRR required this visit?  No   CCM required this visit?  No      Plan:     I have personally reviewed and noted the following in the patient's chart:  Medical and social history Use of alcohol, tobacco or illicit drugs  Current medications and supplements including opioid prescriptions.  Functional ability and status Nutritional status Physical activity Advanced directives List of other physicians Hospitalizations, surgeries, and ER visits in previous 12 months Vitals Screenings to include cognitive, depression, and falls Referrals and appointments  In addition, I have reviewed and discussed with patient certain preventive protocols, quality metrics, and best practice recommendations. A written personalized care plan for preventive services as well as general preventive health recommendations were provided to patient.     Sheral Flow, LPN   [id number]   Nurse Notes:  Patient is cogitatively intact. There were no vitals filed for this visit. There is no height or weight on file to calculate BMI. Patient stated that she has no issues with gait or balance; does not use any assistive devices. Medications reviewed with patient; no opioid use noted.     Medical screening examination/treatment/procedure(s) were performed by non-physician practitioner and as supervising physician I was immediately available for consultation/collaboration.  I agree with above. Lew Dawes, MD

## 2021-10-10 NOTE — Patient Instructions (Signed)
[name] , Thank you for taking time to come for your Medicare Wellness Visit. I appreciate your ongoing commitment to your health goals. Please review the following plan we discussed and let me know if I can assist you in the future.   Screening recommendations/referrals: Colonoscopy: 06/27/2020; due every 5 years (due: 06/27/2025) Mammogram: 01/12/2021; due every year (due: 01/12/2022) Bone Density: 07/03/2021; due every 5 years (due: 07/04/2026) Recommended yearly ophthalmology/optometry visit for glaucoma screening and checkup Recommended yearly dental visit for hygiene and checkup  Vaccinations: Influenza vaccine: 12/13/2020 Pneumococcal vaccine: 01/22/2012, 08/29/2015 Tdap vaccine: 09/06/2013; due every 10 years Shingles vaccine: never done   Covid-19: 08/25/2019, 09/15/2019, 03/26/2020, 03/26/2021  Advanced directives: Yes; son is aware of wishes.  Conditions/risks identified: Yes  Next appointment: Please schedule your next Medicare Wellness Visit with your Nurse Health Advisor in 1 year by calling [phone].   Preventive Care 76 Years and Older, Female Preventive care refers to lifestyle choices and visits with your health care provider that can promote health and wellness. What does preventive care include? A yearly physical exam. This is also called an annual well check. Dental exams once or twice a year. Routine eye exams. Ask your health care provider how often you should have your eyes checked. Personal lifestyle choices, including: Daily care of your teeth and gums. Regular physical activity. Eating a healthy diet. Avoiding tobacco and drug use. Limiting alcohol use. Practicing safe sex. Taking low-dose aspirin every day. Taking vitamin and mineral supplements as recommended by your health care provider. What happens during an annual well check? The services and screenings done by your health care provider during your annual well check will depend on your age, overall  health, lifestyle risk factors, and family history of disease. Counseling  Your health care provider may ask you questions about your: Alcohol use. Tobacco use. Drug use. Emotional well-being. Home and relationship well-being. Sexual activity. Eating habits. History of falls. Memory and ability to understand (cognition). Work and work Statistician. Reproductive health. Screening  You may have the following tests or measurements: Height, weight, and BMI. Blood pressure. Lipid and cholesterol levels. These may be checked every 5 years, or more frequently if you are over 62 years old. Skin check. Lung cancer screening. You may have this screening every year starting at age 67 if you have a 30-pack-year history of smoking and currently smoke or have quit within the past 15 years. Fecal occult blood test (FOBT) of the stool. You may have this test every year starting at age 77. Flexible sigmoidoscopy or colonoscopy. You may have a sigmoidoscopy every 5 years or a colonoscopy every 10 years starting at age 39. Hepatitis C blood test. Hepatitis B blood test. Sexually transmitted disease (STD) testing. Diabetes screening. This is done by checking your blood sugar (glucose) after you have not eaten for a while (fasting). You may have this done every 1-3 years. Bone density scan. This is done to screen for osteoporosis. You may have this done starting at age 84. Mammogram. This may be done every 1-2 years. Talk to your health care provider about how often you should have regular mammograms. Talk with your health care provider about your test results, treatment options, and if necessary, the need for more tests. Vaccines  Your health care provider may recommend certain vaccines, such as: Influenza vaccine. This is recommended every year. Tetanus, diphtheria, and acellular pertussis (Tdap, Td) vaccine. You may need a Td booster every 10 years. Zoster vaccine. You may need  this after age  83. Pneumococcal 13-valent conjugate (PCV13) vaccine. One dose is recommended after age 52. Pneumococcal polysaccharide (PPSV23) vaccine. One dose is recommended after age 50. Talk to your health care provider about which screenings and vaccines you need and how often you need them. This information is not intended to replace advice given to you by your health care provider. Make sure you discuss any questions you have with your health care provider. Document Released: 04/14/2015 Document Revised: 12/06/2015 Document Reviewed: 01/17/2015 Elsevier Interactive Patient Education  2017 [place] Prevention in the Home Falls can cause injuries. They can happen to people of all ages. There are many things you can do to make your home safe and to help prevent falls. What can I do on the outside of my home? Regularly fix the edges of walkways and driveways and fix any cracks. Remove anything that might make you trip as you walk through a door, such as a raised step or threshold. Trim any bushes or trees on the path to your home. Use bright outdoor lighting. Clear any walking paths of anything that might make someone trip, such as rocks or tools. Regularly check to see if handrails are loose or broken. Make sure that both sides of any steps have handrails. Any raised decks and porches should have guardrails on the edges. Have any leaves, snow, or ice cleared regularly. Use sand or salt on walking paths during winter. Clean up any spills in your garage right away. This includes oil or grease spills. What can I do in the bathroom? Use night lights. Install grab bars by the toilet and in the tub and shower. Do not use towel bars as grab bars. Use non-skid mats or decals in the tub or shower. If you need to sit down in the shower, use a plastic, non-slip stool. Keep the floor dry. Clean up any water that spills on the floor as soon as it happens. Remove soap buildup in the tub or shower  regularly. Attach bath mats securely with double-sided non-slip rug tape. Do not have throw rugs and other things on the floor that can make you trip. What can I do in the bedroom? Use night lights. Make sure that you have a light by your bed that is easy to reach. Do not use any sheets or blankets that are too big for your bed. They should not hang down onto the floor. Have a firm chair that has side arms. You can use this for support while you get dressed. Do not have throw rugs and other things on the floor that can make you trip. What can I do in the kitchen? Clean up any spills right away. Avoid walking on wet floors. Keep items that you use a lot in easy-to-reach places. If you need to reach something above you, use a strong step stool that has a grab bar. Keep electrical cords out of the way. Do not use floor polish or wax that makes floors slippery. If you must use wax, use non-skid floor wax. Do not have throw rugs and other things on the floor that can make you trip. What can I do with my stairs? Do not leave any items on the stairs. Make sure that there are handrails on both sides of the stairs and use them. Fix handrails that are broken or loose. Make sure that handrails are as long as the stairways. Check any carpeting to make sure that it is firmly attached to  the stairs. Fix any carpet that is loose or worn. Avoid having throw rugs at the top or bottom of the stairs. If you do have throw rugs, attach them to the floor with carpet tape. Make sure that you have a light switch at the top of the stairs and the bottom of the stairs. If you do not have them, ask someone to add them for you. What else can I do to help prevent falls? Wear shoes that: Do not have high heels. Have rubber bottoms. Are comfortable and fit you well. Are closed at the toe. Do not wear sandals. If you use a stepladder: Make sure that it is fully opened. Do not climb a closed stepladder. Make sure that  both sides of the stepladder are locked into place. Ask someone to hold it for you, if possible. Clearly mark and make sure that you can see: Any grab bars or handrails. First and last steps. Where the edge of each step is. Use tools that help you move around (mobility aids) if they are needed. These include: Canes. Walkers. Scooters. Crutches. Turn on the lights when you go into a dark area. Replace any light bulbs as soon as they burn out. Set up your furniture so you have a clear path. Avoid moving your furniture around. If any of your floors are uneven, fix them. If there are any pets around you, be aware of where they are. Review your medicines with your doctor. Some medicines can make you feel dizzy. This can increase your chance of falling. Ask your doctor what other things that you can do to help prevent falls. This information is not intended to replace advice given to you by your health care provider. Make sure you discuss any questions you have with your health care provider. Document Released: 01/12/2009 Document Revised: 08/24/2015 Document Reviewed: 04/22/2014 Elsevier Interactive Patient Education  2017 [employer].

## 2021-10-19 ENCOUNTER — Other Ambulatory Visit: Payer: Self-pay | Admitting: Internal Medicine

## 2021-10-19 ENCOUNTER — Encounter: Payer: Self-pay | Admitting: Podiatry

## 2021-10-19 ENCOUNTER — Ambulatory Visit: Payer: Medicare Other | Admitting: Podiatry

## 2021-10-19 DIAGNOSIS — M79675 Pain in left toe(s): Secondary | ICD-10-CM

## 2021-10-19 DIAGNOSIS — N1831 Chronic kidney disease, stage 3a: Secondary | ICD-10-CM | POA: Diagnosis not present

## 2021-10-19 DIAGNOSIS — B351 Tinea unguium: Secondary | ICD-10-CM

## 2021-10-19 DIAGNOSIS — E1122 Type 2 diabetes mellitus with diabetic chronic kidney disease: Secondary | ICD-10-CM | POA: Diagnosis not present

## 2021-10-19 DIAGNOSIS — M79674 Pain in right toe(s): Secondary | ICD-10-CM

## 2021-10-19 DIAGNOSIS — Q828 Other specified congenital malformations of skin: Secondary | ICD-10-CM

## 2021-10-19 NOTE — Progress Notes (Signed)
This patient returns to my office for at risk foot care.  This patient requires this care by a professional since this patient will be at risk due to having diabetes and CKD.  This patient is unable to cut nails herself since the patient cannot reach hernails.These nails are painful walking and wearing shoes.  She also has a painful callus each forefoot.This patient presents for at risk foot care today.  General Appearance  Alert, conversant and in no acute stress.  Vascular  Dorsalis pedis and posterior tibial  pulses are palpable  bilaterally.  Capillary return is within normal limits  bilaterally. Temperature is within normal limits  bilaterally.  Neurologic  Senn-Weinstein monofilament wire test within normal limits  bilaterally. Muscle power within normal limits bilaterally.  Nails Thick disfigured discolored nails with subungual debris  from hallux to fifth toes bilaterally. No evidence of bacterial infection or drainage bilaterally.  Orthopedic  No limitations of motion  feet .  No crepitus or effusions noted.  No bony pathology or digital deformities noted.  Skin  normotropic skin noted bilaterally.  No signs of infections or ulcers noted.   Porokeratosis sub 3 left foot and sub 4 right foot.  Onychomycosis  Pain in right toes  Pain in left toes  Consent was obtained for treatment procedures.   Mechanical debridement of nails 1-5  bilaterally performed with a nail nipper.  Filed with dremel without incident.    Return office visit   3 months                   Told patient to return for periodic foot care and evaluation due to potential at risk complications.   Shoshanna Mcquitty DPM   

## 2021-10-23 ENCOUNTER — Telehealth: Payer: Medicare Other

## 2021-11-21 ENCOUNTER — Other Ambulatory Visit: Payer: Medicare Other

## 2021-11-21 DIAGNOSIS — E785 Hyperlipidemia, unspecified: Secondary | ICD-10-CM | POA: Diagnosis not present

## 2021-11-21 DIAGNOSIS — E1169 Type 2 diabetes mellitus with other specified complication: Secondary | ICD-10-CM | POA: Diagnosis not present

## 2021-11-22 LAB — HEPATIC FUNCTION PANEL
ALT: 10 IU/L (ref 0–32)
AST: 16 IU/L (ref 0–40)
Albumin: 4.5 g/dL (ref 3.8–4.8)
Alkaline Phosphatase: 81 IU/L (ref 44–121)
Bilirubin Total: 0.8 mg/dL (ref 0.0–1.2)
Bilirubin, Direct: 0.18 mg/dL (ref 0.00–0.40)
Total Protein: 6.9 g/dL (ref 6.0–8.5)

## 2021-11-22 LAB — LIPID PANEL
Chol/HDL Ratio: 3.6 ratio (ref 0.0–4.4)
Cholesterol, Total: 121 mg/dL (ref 100–199)
HDL: 34 mg/dL — ABNORMAL LOW (ref >39)
LDL Chol Calc (NIH): 69 mg/dL (ref 0–99)
Triglycerides: 91 mg/dL (ref 0–149)
VLDL Cholesterol Cal: 18 mg/dL (ref 5–40)

## 2021-11-22 LAB — LIPOPROTEIN A (LPA): Lipoprotein (a): 123.1 nmol/L — ABNORMAL HIGH (ref <75.0)

## 2021-11-27 ENCOUNTER — Telehealth: Payer: Self-pay | Admitting: *Deleted

## 2021-11-27 DIAGNOSIS — E7841 Elevated Lipoprotein(a): Secondary | ICD-10-CM

## 2021-11-27 NOTE — Telephone Encounter (Unsigned)
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  Pt aware she will be contacted to schedule.

## 2021-11-27 NOTE — Telephone Encounter (Signed)
-----   Message from Early Osmond, MD sent at 11/22/2021 10:58 AM EDT ----- Please refer to Dr. Debara Pickett for elevated LP(a).

## 2021-12-20 ENCOUNTER — Telehealth: Payer: Self-pay

## 2021-12-20 NOTE — Patient Outreach (Signed)
  Care Coordination   Initial Visit Note   12/20/2021 Name: Jade Sullivan MRN: [id number] DOB: Aug 14, 1945  Jade Sullivan is a 76 y.o. year old female who sees Hoyt Koch, MD for primary care. I spoke with  Jade Sullivan by phone today.  What matters to the patients health and wellness today?  She reports she is in the process of changing providers. Patient denies any care coordination, disease management or resource needs at this time   Goals Addressed             This Visit's Progress    COMPLETED: Care Coordination Actvities-no follow up required       Care Coordination Interventions: Discussed care coordination program Encouraged to contact primary care provider if care coordination, disease management or resource needs in the future.         SDOH assessments and interventions completed:  Yes  SDOH Interventions Today    Flowsheet Row Most Recent Value  SDOH Interventions   Food Insecurity Interventions Intervention Not Indicated  Housing Interventions Intervention Not Indicated  Transportation Interventions Intervention Not Indicated  Utilities Interventions Intervention Not Indicated        Care Coordination Interventions Activated:  Yes  Care Coordination Interventions:  Yes, provided   Follow up plan: No further intervention required.   Encounter Outcome:  Pt. Visit Completed   Thea Silversmith, RN, MSN, BSN, [place] Coordinator [phone]

## 2021-12-24 ENCOUNTER — Other Ambulatory Visit: Payer: Self-pay | Admitting: Family Medicine

## 2021-12-24 DIAGNOSIS — Z1231 Encounter for screening mammogram for malignant neoplasm of breast: Secondary | ICD-10-CM

## 2021-12-26 NOTE — Progress Notes (Unsigned)
[place] Phone: [phone] Subjective:   [name], am serving as a scribe for Dr. Hulan Saas.   I'm seeing this patient by the request  of:  Hoyt Koch, MD  CC: Right knee pain  GNO:[id number]  09/26/2021 Patient is doing much better with the viscosupplementation.  Discussed with patient that if needed we will repeat we can always do it again every 6 months.  Patient is going to make no other changes and follow-up with me again in 3 months.  Update 12/27/2021 YARELIS AMBROSINO is a 76 y.o. female coming in with complaint of R knee pain. Patient states that pain has increased in back of R knee. Pain with squatting. Swelling has increased in R knee.  Increasing instability.  Feels like she is having worsening pain.     Past Medical History:  Diagnosis Date   Allergy    seasonal allergies   Arthritis    bilateral knees   Cataract    not a surgical candidate at this time (05/29/2020)   Chronic kidney disease    COVID-19 04/2019   Diabetes mellitus without complication ([place])    on meds   Diverticulosis of colon (without mention of hemorrhage)    Dysmetabolic syndrome X    Glaucoma    on meds   Gout    H/O hyperthyroidism    By patient h/o hyperthyroidism with protosis, s/p subtotal thyroidectomy. On no replacement therapy. Last thyroid functions 2013 in [place]  Plan Will need thyroid functions Feb '15 with recommendations to follow.     Heart murmur    hx of   Hyperlipidemia    on meds   Hypertension    on multiple meds   Hypothyroidism    on meds   Kidney lesion    On supplemental oxygen by nasal cannula    at bedtime   Pancreatic cyst    Tubular adenoma of colon    Past Surgical History:  Procedure Laterality Date   BIOPSY  06/27/2020   Procedure: BIOPSY;  Surgeon: Jerene Bears, MD;  Location: [facility];  Service: Gastroenterology;;   [place]  2016   JMP-MAC-2 day suprep (good)-TICS/TA   COLONOSCOPY WITH PROPOFOL N/A 06/27/2020   Procedure: COLONOSCOPY WITH PROPOFOL;  Surgeon: Jerene Bears, MD;  Location: [facility];  Service: Gastroenterology;  Laterality: N/A;   EYE SURGERY     LIPOMA EXCISION Right 11/24/2014   Procedure: EXCISION RIGHT SHOULDER LIPOMA;  Surgeon: Erroll Luna, MD;  Location: [facility];  Service: General;  Laterality: Right;   POLYPECTOMY  2016   TA   POLYPECTOMY  06/27/2020   Procedure: POLYPECTOMY;  Surgeon: Jerene Bears, MD;  Location: [facility];  Service: Gastroenterology;;   THYROIDECTOMY, PARTIAL  1981   TONSILLECTOMY     UMBILICAL HERNIA REPAIR  2000   WISDOM TOOTH EXTRACTION     Social History   Socioeconomic History   Marital status: Divorced    Spouse name: Not on file   Number of children: 1   Years of education: Not on file   Highest education level: Not on file  Occupational History   Occupation: Retired  Tobacco Use   Smoking status: Former   Smokeless tobacco: Never  Scientific laboratory technician Use: Never used  Substance and Sexual Activity   Alcohol use: No    Alcohol/week: 0.0 standard  drinks of alcohol   Drug use: No   Sexual activity: Not Currently    Comment: 1st intercourse- 16, partners- 5, divorced  Other Topics Concern   Not on file  Social History Narrative   Divorced   Retired - Engineer, maintenance (IT)   Social Determinants of Health   Financial Resource Strain: [place]  (10/10/2021)   Overall Financial Resource Strain (CARDIA)    Difficulty of Paying Living Expenses: Not hard at all  Food Insecurity: No Food Insecurity (12/20/2021)   Hunger Vital Sign    Worried About Running Out of Food in the Last Year: Never true    Ran Out of Food in the Last Year: Never true  Transportation Needs: No Transportation Needs (12/20/2021)   PRAPARE - Hydrologist (Medical): No    Lack of Transportation (Non-Medical): No   Physical Activity: Sufficiently Active (10/10/2021)   Exercise Vital Sign    Days of Exercise per Week: 5 days    Minutes of Exercise per Session: 30 min  Stress: No Stress Concern Present (10/10/2021)   [place]    Feeling of Stress : Not at all  Social Connections: Moderately Integrated (10/10/2021)   Social Connection and Isolation Panel [NHANES]    Frequency of Communication with Friends and Family: More than three times a week    Frequency of Social Gatherings with Friends and Family: Twice a week    Attends Religious Services: More than 4 times per year    Active Member of [employer] or Organizations: Yes    Attends Music therapist: More than 4 times per year    Marital Status: Divorced   Allergies  Allergen Reactions   Amlodipine     Gingival hyperplasia   Hctz [Hydrochlorothiazide] Other (See Comments)    gout   Jardiance [Empagliflozin] Other (See Comments)    Yeast Infections   Family History  Problem Relation Age of Onset   Diabetes Mother        DM2   Hypertension Mother    Pneumonia Mother        died from this condition   Colon polyps Mother    Heart disease Father        died from this condition   Colon polyps Father    Kidney disease Sister        HBP and DM2; relative died from this condition   Colon polyps Sister    Heart disease Brother        MI - HBP and DM2   Colon polyps Brother    Colon polyps Brother    Colon cancer Neg Hx    Esophageal cancer Neg Hx    Rectal cancer Neg Hx    Stomach cancer Neg Hx     Current Outpatient Medications (Endocrine & Metabolic):    levothyroxine (SYNTHROID) 50 MCG tablet, Take 1 tablet (50 mcg total) by mouth daily before breakfast.  Current Outpatient Medications (Cardiovascular):    atorvastatin (LIPITOR) 80 MG tablet, Take 1 tablet (80 mg total) by mouth daily.   cloNIDine (CATAPRES - DOSED IN MG/24 HR) 0.3 mg/24hr patch, Place 0.3  mg onto the skin every Wednesday.   cloNIDine (CATAPRES - DOSED IN MG/24 HR) 0.3 mg/24hr patch, 0.3 mg once a week.   cloNIDine (CATAPRES) 0.2 MG tablet, Take 0.2 mg by mouth 3 (three) times daily.   irbesartan (AVAPRO) 300 MG tablet, TAKE 1 TABLET(300  MG) BY MOUTH IN THE MORNING   minoxidil (LONITEN) 2.5 MG tablet, Take 5 mg by mouth in the morning, at noon, and at bedtime.   nebivolol (BYSTOLIC) 5 MG tablet, Take 5 mg by mouth in the morning.   spironolactone (ALDACTONE) 100 MG tablet, Take 100 mg by mouth every evening.   Current Outpatient Medications (Analgesics):    aspirin EC 81 MG tablet, Take 81 mg by mouth every evening. Swallow whole.   aspirin-acetaminophen-caffeine (EXCEDRIN MIGRAINE) 250-250-65 MG tablet, Take 1-2 tablets by mouth every 6 (six) hours as needed for headache (sinus headaches/pain).  Current Outpatient Medications (Hematological):    vitamin B-12 (CYANOCOBALAMIN) 500 MCG tablet, Take 1,000 mcg by mouth every evening.  Current Outpatient Medications (Other):    ALPRAZolam (XANAX) 0.25 MG tablet, Take 1 tablet (0.25 mg total) by mouth 2 (two) times daily as needed for anxiety (prior to dentist).   cholecalciferol (VITAMIN D3) 25 MCG (1000 UT) tablet, Take 1,000 Units by mouth daily.   diazepam (VALIUM) 5 MG tablet, Take 1 tablet an hour prior to MRI and may repeat if needed   fish oil-omega-3 fatty acids 1000 MG capsule, Take 2 g by mouth daily.   gabapentin (NEURONTIN) 100 MG capsule, Take 1 capsule (100 mg total) by mouth at bedtime. (Patient taking differently: Take 100 mg by mouth daily as needed.)   latanoprost (XALATAN) 0.005 % ophthalmic solution, Place 1 drop into both eyes at bedtime.    Multiple Vitamin (MULTIVITAMIN WITH MINERALS) TABS tablet, Take 1 tablet by mouth every evening.   timolol (TIMOPTIC) 0.5 % ophthalmic solution, Place 1 drop into both eyes 2 (two) times daily.   Reviewed prior external information including notes and imaging from   primary care provider As well as notes that were available from care everywhere and other healthcare systems.  Past medical history, social, surgical and family history all reviewed in electronic medical record.  No pertanent information unless stated regarding to the chief complaint.   Review of Systems:  No headache, visual changes, nausea, vomiting, diarrhea, constipation, dizziness, abdominal pain, skin rash, fevers, chills, night sweats, weight loss, swollen lymph nodes, body aches, joint swelling, chest pain, shortness of breath, mood changes. POSITIVE muscle aches  Objective  Blood pressure 112/86, pulse (!) 57, height _0  (1.626 m), weight 199 lb (90.3 kg), SpO2 98 %.   General: No apparent distress alert and oriented x3 mood and affect normal, dressed appropriately.  HEENT: Pupils equal, extraocular movements intact  Respiratory: Patient's speak in full sentences and does not appear short of breath  Cardiovascular: No lower extremity edema, non tender, no erythema  Right knee does have effusion noted.  Lacks last 10 degrees of flexion.  Instability with valgus and varus force.  Procedure: Real-time Ultrasound Guided Injection of right knee Device: GE Logiq Q7 Ultrasound guided injection is preferred based studies that show increased duration, increased effect, greater accuracy, decreased procedural pain, increased response rate, and decreased cost with ultrasound guided versus blind injection.  Verbal informed consent obtained.  Time-out conducted.  Noted no overlying erythema, induration, or other signs of local infection.  Skin prepped in a sterile fashion.  Local anesthesia: Topical Ethyl chloride.  With sterile technique and under real time ultrasound guidance: With 21-gauge 2 inch needle patient was injected with 1 cc of 0.5% Marcaine.  Attempted aspiration but on ultrasound shows that there is a significant synovitis causing it to be difficult for her to allow for  aspiration.  Patient then  was injected with 1 cc of Kenalog 40 mg/mL Completed without difficulty  Pain immediately resolved suggesting accurate placement of the medication.  Advised to call if fevers/chills, erythema, induration, drainage, or persistent bleeding.  Impression: Technically successful ultrasound guided injection.    Impression and Recommendations:

## 2021-12-27 ENCOUNTER — Ambulatory Visit: Payer: Self-pay

## 2021-12-27 ENCOUNTER — Encounter: Payer: Self-pay | Admitting: Family Medicine

## 2021-12-27 ENCOUNTER — Ambulatory Visit: Payer: Medicare Other | Admitting: Family Medicine

## 2021-12-27 VITALS — BP 112/86 | HR 57 | Ht 64.0 in | Wt 199.0 lb

## 2021-12-27 DIAGNOSIS — M25561 Pain in right knee: Secondary | ICD-10-CM

## 2021-12-27 DIAGNOSIS — G8929 Other chronic pain: Secondary | ICD-10-CM | POA: Diagnosis not present

## 2021-12-27 DIAGNOSIS — M170 Bilateral primary osteoarthritis of knee: Secondary | ICD-10-CM

## 2021-12-27 NOTE — Patient Instructions (Addendum)
Attempted to aspirate knee today Injected steroid Looks like synovitis Have a good conference Call me if you need me otherwise see me in 2 months If calf pain over weekend please go to ED

## 2021-12-27 NOTE — Assessment & Plan Note (Signed)
Right knee attempted aspiration but secondary to severe synovitis unable to do any significant aspiration.  Patient was given a steroid injection so we will see how patient responds.  Discussed which activities to do and which ones to avoid.  Follow-up again in 6 to 8 weeks.

## 2022-01-18 ENCOUNTER — Ambulatory Visit
Admission: RE | Admit: 2022-01-18 | Discharge: 2022-01-18 | Disposition: A | Discharge status: Home / Self Care | Payer: Medicare Other | Source: Ambulatory Visit | Attending: Family Medicine | Admitting: Family Medicine

## 2022-01-18 DIAGNOSIS — Z1231 Encounter for screening mammogram for malignant neoplasm of breast: Secondary | ICD-10-CM

## 2022-01-23 DIAGNOSIS — E1122 Type 2 diabetes mellitus with diabetic chronic kidney disease: Secondary | ICD-10-CM | POA: Diagnosis not present

## 2022-01-23 DIAGNOSIS — I700 Atherosclerosis of aorta: Secondary | ICD-10-CM | POA: Diagnosis not present

## 2022-01-23 DIAGNOSIS — N183 Chronic kidney disease, stage 3 unspecified: Secondary | ICD-10-CM | POA: Diagnosis not present

## 2022-01-23 DIAGNOSIS — E7800 Pure hypercholesterolemia, unspecified: Secondary | ICD-10-CM | POA: Diagnosis not present

## 2022-01-23 DIAGNOSIS — E890 Postprocedural hypothyroidism: Secondary | ICD-10-CM | POA: Diagnosis not present

## 2022-01-23 DIAGNOSIS — I10 Essential (primary) hypertension: Secondary | ICD-10-CM | POA: Diagnosis not present

## 2022-01-24 DIAGNOSIS — I129 Hypertensive chronic kidney disease with stage 1 through stage 4 chronic kidney disease, or unspecified chronic kidney disease: Secondary | ICD-10-CM | POA: Diagnosis not present

## 2022-01-24 DIAGNOSIS — E782 Mixed hyperlipidemia: Secondary | ICD-10-CM | POA: Diagnosis not present

## 2022-01-24 DIAGNOSIS — N182 Chronic kidney disease, stage 2 (mild): Secondary | ICD-10-CM | POA: Diagnosis not present

## 2022-02-01 ENCOUNTER — Ambulatory Visit: Payer: Medicare Other | Admitting: Podiatry

## 2022-02-01 ENCOUNTER — Encounter: Payer: Self-pay | Admitting: Podiatry

## 2022-02-01 DIAGNOSIS — M79675 Pain in left toe(s): Secondary | ICD-10-CM

## 2022-02-01 DIAGNOSIS — N1831 Chronic kidney disease, stage 3a: Secondary | ICD-10-CM | POA: Diagnosis not present

## 2022-02-01 DIAGNOSIS — B351 Tinea unguium: Secondary | ICD-10-CM

## 2022-02-01 DIAGNOSIS — Q828 Other specified congenital malformations of skin: Secondary | ICD-10-CM | POA: Diagnosis not present

## 2022-02-01 DIAGNOSIS — M79674 Pain in right toe(s): Secondary | ICD-10-CM | POA: Diagnosis not present

## 2022-02-01 DIAGNOSIS — E1122 Type 2 diabetes mellitus with diabetic chronic kidney disease: Secondary | ICD-10-CM | POA: Diagnosis not present

## 2022-02-01 NOTE — Progress Notes (Signed)
This patient returns to my office for at risk foot care.  This patient requires this care by a professional since this patient will be at risk due to having diabetes and CKD.  This patient is unable to cut nails herself since the patient cannot reach hernails.These nails are painful walking and wearing shoes.  She also has a painful callus each forefoot.This patient presents for at risk foot care today.  General Appearance  Alert, conversant and in no acute stress.  Vascular  Dorsalis pedis and posterior tibial  pulses are palpable  bilaterally.  Capillary return is within normal limits  bilaterally. Temperature is within normal limits  bilaterally.  Neurologic  Senn-Weinstein monofilament wire test within normal limits  bilaterally. Muscle power within normal limits bilaterally.  Nails Thick disfigured discolored nails with subungual debris  from hallux to fifth toes bilaterally. No evidence of bacterial infection or drainage bilaterally.  Orthopedic  No limitations of motion  feet .  No crepitus or effusions noted.  No bony pathology or digital deformities noted.  Skin  normotropic skin noted bilaterally.  No signs of infections or ulcers noted.   Porokeratosis sub 3 left foot and sub 4 right foot.  Onychomycosis  Pain in right toes  Pain in left toes  Consent was obtained for treatment procedures.   Mechanical debridement of nails 1-5  bilaterally performed with a nail nipper.  Filed with dremel without incident.    Return office visit   3 months                   Told patient to return for periodic foot care and evaluation due to potential at risk complications.   Horace Wishon DPM   

## 2022-02-26 NOTE — Progress Notes (Unsigned)
[place] Jade Sullivan Phone: [phone] Subjective:   [name], am serving as a scribe for Dr. Hulan Saas.  I'm seeing this patient by the request  of:  Hoyt Koch, MD  CC:   [id number]  12/27/2021 Right knee attempted aspiration but secondary to severe synovitis unable to do any significant aspiration.  Patient was given a steroid injection so we will see how patient responds.  Discussed which activities to do and which ones to avoid.  Follow-up again in 6 to 8 weeks.   Update 11/30 Jade Sullivan is a 76 y.o. female coming in with complaint of B knee pain. R>L. Patient states that her R knee continues to bother her. Pain back of knee. Traveled to [facility] and this caused an increase in her knee pain. Feels      Past Medical History:  Diagnosis Date   Allergy    seasonal allergies   Arthritis    bilateral knees   Cataract    not a surgical candidate at this time (05/29/2020)   Chronic kidney disease    COVID-19 04/2019   Diabetes mellitus without complication ([place])    on meds   Diverticulosis of colon (without mention of hemorrhage)    Dysmetabolic syndrome X    Glaucoma    on meds   Gout    H/O hyperthyroidism    By patient h/o hyperthyroidism with protosis, s/p subtotal thyroidectomy. On no replacement therapy. Last thyroid functions 2013 in [place]  Plan Will need thyroid functions Feb '15 with recommendations to follow.     Heart murmur    hx of   Hyperlipidemia    on meds   Hypertension    on multiple meds   Hypothyroidism    on meds   Kidney lesion    On supplemental oxygen by nasal cannula    at bedtime   Pancreatic cyst    Tubular adenoma of colon    Past Surgical History:  Procedure Laterality Date   BIOPSY  06/27/2020   Procedure: BIOPSY;  Surgeon: Jerene Bears, MD;  Location: [facility];  Service: Gastroenterology;;   [place]  2016    JMP-MAC-2 day suprep (good)-TICS/TA   COLONOSCOPY WITH PROPOFOL N/A 06/27/2020   Procedure: COLONOSCOPY WITH PROPOFOL;  Surgeon: Jerene Bears, MD;  Location: [facility];  Service: Gastroenterology;  Laterality: N/A;   EYE SURGERY     LIPOMA EXCISION Right 11/24/2014   Procedure: EXCISION RIGHT SHOULDER LIPOMA;  Surgeon: Erroll Luna, MD;  Location: [place];  Service: General;  Laterality: Right;   POLYPECTOMY  2016   TA   POLYPECTOMY  06/27/2020   Procedure: POLYPECTOMY;  Surgeon: Jerene Bears, MD;  Location: [facility];  Service: Gastroenterology;;   THYROIDECTOMY, PARTIAL  1981   TONSILLECTOMY     UMBILICAL HERNIA REPAIR  2000   WISDOM TOOTH EXTRACTION     Social History   Socioeconomic History   Marital status: Divorced    Spouse name: Not on file   Number of children: 1   Years of education: Not on file   Highest education level: Not on file  Occupational History   Occupation: Retired  Tobacco Use   Smoking status: Former   Smokeless tobacco: Never  Scientific laboratory technician Use: Never used  Substance and Sexual Activity   Alcohol use: No    Alcohol/week: 0.0 standard  drinks of alcohol   Drug use: No   Sexual activity: Not Currently    Comment: 1st intercourse- 16, partners- 5, divorced  Other Topics Concern   Not on file  Social History Narrative   Divorced   Retired - Engineer, maintenance (IT)   Social Determinants of Health   Financial Resource Strain: [place]  (10/10/2021)   Overall Financial Resource Strain (CARDIA)    Difficulty of Paying Living Expenses: Not hard at all  Food Insecurity: No Food Insecurity (12/20/2021)   Hunger Vital Sign    Worried About Running Out of Food in the Last Year: Never true    Ran Out of Food in the Last Year: Never true  Transportation Needs: No Transportation Needs (12/20/2021)   PRAPARE - Hydrologist (Medical): No    Lack of Transportation (Non-Medical): No  Physical Activity:  Sufficiently Active (10/10/2021)   Exercise Vital Sign    Days of Exercise per Week: 5 days    Minutes of Exercise per Session: 30 min  Stress: No Stress Concern Present (10/10/2021)   [place]    Feeling of Stress : Not at all  Social Connections: Moderately Integrated (10/10/2021)   Social Connection and Isolation Panel [NHANES]    Frequency of Communication with Friends and Family: More than three times a week    Frequency of Social Gatherings with Friends and Family: Twice a week    Attends Religious Services: More than 4 times per year    Active Member of [employer] or Organizations: Yes    Attends Music therapist: More than 4 times per year    Marital Status: Divorced   Allergies  Allergen Reactions   Amlodipine     Gingival hyperplasia   Hctz [Hydrochlorothiazide] Other (See Comments)    gout   Jardiance [Empagliflozin] Other (See Comments)    Yeast Infections   Family History  Problem Relation Age of Onset   Diabetes Mother        DM2   Hypertension Mother    Pneumonia Mother        died from this condition   Colon polyps Mother    Heart disease Father        died from this condition   Colon polyps Father    Kidney disease Sister        HBP and DM2; relative died from this condition   Colon polyps Sister    Heart disease Brother        MI - HBP and DM2   Colon polyps Brother    Colon polyps Brother    Colon cancer Neg Hx    Esophageal cancer Neg Hx    Rectal cancer Neg Hx    Stomach cancer Neg Hx     Current Outpatient Medications (Endocrine & Metabolic):    levothyroxine (SYNTHROID) 50 MCG tablet, Take 1 tablet (50 mcg total) by mouth daily before breakfast.  Current Outpatient Medications (Cardiovascular):    atorvastatin (LIPITOR) 80 MG tablet, Take 1 tablet (80 mg total) by mouth daily.   cloNIDine (CATAPRES - DOSED IN MG/24 HR) 0.3 mg/24hr patch, Place 0.3 mg onto the skin  every Wednesday.   cloNIDine (CATAPRES - DOSED IN MG/24 HR) 0.3 mg/24hr patch, 0.3 mg once a week.   cloNIDine (CATAPRES) 0.2 MG tablet, Take 0.2 mg by mouth 3 (three) times daily.   irbesartan (AVAPRO) 300 MG tablet, TAKE 1 TABLET(300  MG) BY MOUTH IN THE MORNING   minoxidil (LONITEN) 2.5 MG tablet, Take 5 mg by mouth in the morning, at noon, and at bedtime.   nebivolol (BYSTOLIC) 5 MG tablet, Take 5 mg by mouth in the morning.   spironolactone (ALDACTONE) 100 MG tablet, Take 100 mg by mouth every evening.   Current Outpatient Medications (Analgesics):    aspirin EC 81 MG tablet, Take 81 mg by mouth every evening. Swallow whole.   aspirin-acetaminophen-caffeine (EXCEDRIN MIGRAINE) 250-250-65 MG tablet, Take 1-2 tablets by mouth every 6 (six) hours as needed for headache (sinus headaches/pain).  Current Outpatient Medications (Hematological):    vitamin B-12 (CYANOCOBALAMIN) 500 MCG tablet, Take 1,000 mcg by mouth every evening.  Current Outpatient Medications (Other):    ALPRAZolam (XANAX) 0.25 MG tablet, Take 1 tablet (0.25 mg total) by mouth 2 (two) times daily as needed for anxiety (prior to dentist).   cholecalciferol (VITAMIN D3) 25 MCG (1000 UT) tablet, Take 1,000 Units by mouth daily.   diazepam (VALIUM) 5 MG tablet, Take 1 tablet an hour prior to MRI and may repeat if needed   fish oil-omega-3 fatty acids 1000 MG capsule, Take 2 g by mouth daily.   gabapentin (NEURONTIN) 100 MG capsule, Take 2 capsules (200 mg total) by mouth at bedtime.   latanoprost (XALATAN) 0.005 % ophthalmic solution, Place 1 drop into both eyes at bedtime.    Multiple Vitamin (MULTIVITAMIN WITH MINERALS) TABS tablet, Take 1 tablet by mouth every evening.   timolol (TIMOPTIC) 0.5 % ophthalmic solution, Place 1 drop into both eyes 2 (two) times daily.   Reviewed prior external information including notes and imaging from  primary care provider As well as notes that were available from care everywhere and  other healthcare systems.  Past medical history, social, surgical and family history all reviewed in electronic medical record.  No pertanent information unless stated regarding to the chief complaint.   Review of Systems:  No headache, visual changes, nausea, vomiting, diarrhea, constipation, dizziness, abdominal pain, skin rash, fevers, chills, night sweats, weight loss, swollen lymph nodes, body acheschest pain, shortness of breath, mood changes. POSITIVE muscle aches, joint swelling  Objective  Blood pressure 124/74, pulse (!) 58, height _0  (1.626 m), weight 198 lb (89.8 kg), SpO2 96 %.   General: No apparent distress alert and oriented x3 mood and affect normal, dressed appropriately.  HEENT: Pupils equal, extraocular movements intact  Respiratory: Patient's speak in full sentences and does not appear short of breath  Cardiovascular: No lower extremity edema, non tender, no erythema  Effusion of the knees bilaterally.  Patient does have instability of the knees with valgus and varus force.  Patient does have difficulty with ambulation.  Lacks last 15 degrees of flexion on the right in the last 10 degrees on the left.  After informed written and verbal consent, patient was seated on exam table. Right knee was prepped with alcohol swab and utilizing anterolateral approach, patient's right knee space was injected with 4:1  marcaine 0.5%: Kenalog 60m/dL. Patient tolerated the procedure well without immediate complications.  After informed written and verbal consent, patient was seated on exam table. Left knee was prepped with alcohol swab and utilizing anterolateral approach, patient's left knee space was injected with 4:1  marcaine 0.5%: Kenalog 467mdL. Patient tolerated the procedure well without immediate complications.    Impression and Recommendations:    The above documentation has been reviewed and is accurate and complete ZaLyndal PulleyDO

## 2022-02-28 ENCOUNTER — Ambulatory Visit: Payer: Medicare Other | Admitting: Family Medicine

## 2022-02-28 VITALS — BP 124/74 | HR 58 | Ht 64.0 in | Wt 198.0 lb

## 2022-02-28 DIAGNOSIS — M170 Bilateral primary osteoarthritis of knee: Secondary | ICD-10-CM | POA: Diagnosis not present

## 2022-02-28 MED ORDER — GABAPENTIN 100 MG PO CAPS: 200.0000 mg | ORAL_CAPSULE | Freq: Every day | ORAL | 0 refills | Status: DC

## 2022-02-28 NOTE — Assessment & Plan Note (Addendum)
Patient did not respond extremely well to the viscosupplementation but does seem to get better improvement with the knee injection.  Bilateral injections given today.  We have been doing this intermittently over the course of last 6 years.  We discussed with patient that we can repeat every 8 weeks if necessary at this time.  Patient still declines any type of surgical intervention.  Discussed the possibility of other things such as Toradol injections intra-articular could be another option if necessary.  Follow-up with me again in 8 to 10 weeks social determinants of health is also does not have the support system for patient having surgery at this time

## 2022-02-28 NOTE — Patient Instructions (Signed)
Happy Holidays Gabapentin '200mg'$  at night See me again in 8 weeks but can call if you want to try toradal injection in knee

## 2022-03-06 ENCOUNTER — Other Ambulatory Visit: Payer: Self-pay | Admitting: Family Medicine

## 2022-03-06 DIAGNOSIS — E890 Postprocedural hypothyroidism: Secondary | ICD-10-CM | POA: Diagnosis not present

## 2022-03-11 DIAGNOSIS — H401131 Primary open-angle glaucoma, bilateral, mild stage: Secondary | ICD-10-CM | POA: Diagnosis not present

## 2022-03-16 ENCOUNTER — Other Ambulatory Visit: Payer: Self-pay | Admitting: Internal Medicine

## 2022-03-28 IMAGING — MR MR ABDOMEN WO/W CM
17 series · 48 of 48 positions shown · IV contrast (gadavist)
Comparison: Abdominal ultrasound of 06/02/2019. Chest CT
04/19/2019.

CLINICAL DATA: Possible cirrhosis on abdominal ultrasound.

EXAM:
MRI ABDOMEN WITHOUT AND WITH CONTRAST
TECHNIQUE: Multiplanar multisequence MR imaging of the abdomen was performed
both before and after the administration of intravenous contrast.
CONTRAST:  9mL GADAVIST GADOBUTROL 1 MMOL/ML IV SOLN

[Series 3: T2 · coronal · 6.0mm · 1.56mm/px · 2 of 34 slices shown (1 of 2)]
[im 1/34]
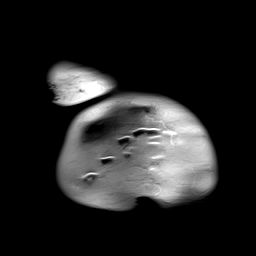
[im 34/34]
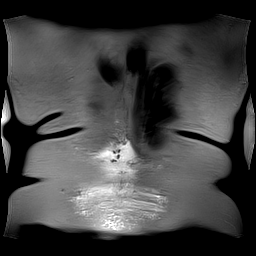

[Series 4: T2 fat-sat · axial · 6.0mm · 1.25mm/px · z∈[-135,+160]mm · 3 of 42 slices shown]
[im 1/42]
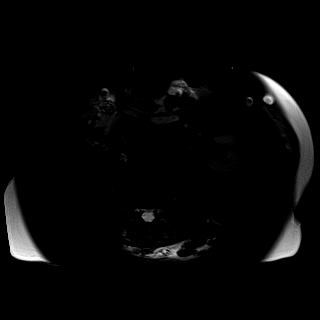
[im 21/42]
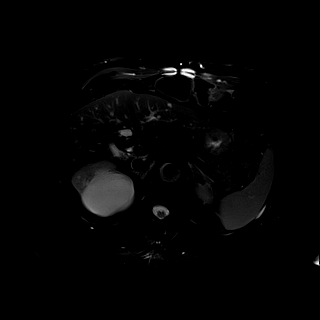
[im 42/42]
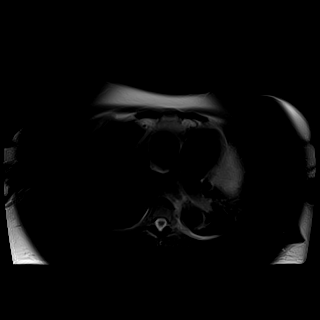

[Series 6: T1 · axial · 3.0mm · 1.25mm/px · z∈[-118,+143]mm · 4 of 88 slices shown (1 of 2)]
[im 1/88]
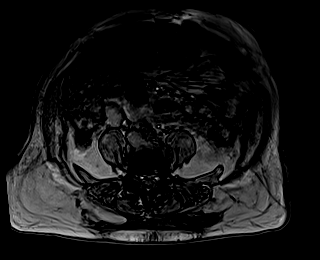
[im 30/88]
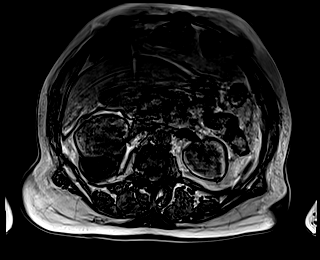
[im 59/88]
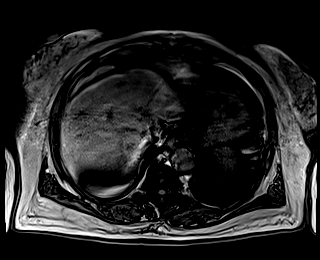
[im 88/88]
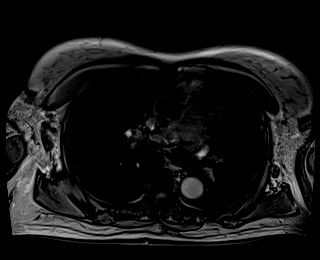

[Series 7: T1 · axial · 3.0mm · 1.25mm/px · z∈[-118,+143]mm · 3 of 88 slices shown (2 of 2)]
[im 1/88]
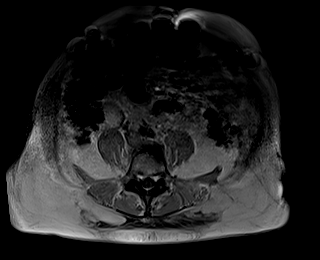
[im 44/88]
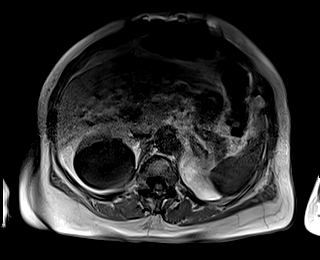
[im 88/88]
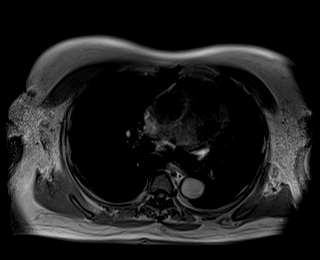

[Series 8: DWI · axial · 6.0mm · 1.49mm/px · z∈[-135,+160]mm · 3 of 84 slices shown (1 of 2)]
[im 1/84]
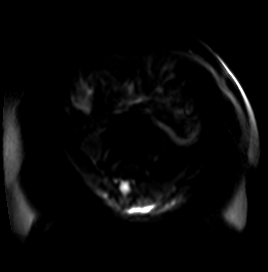
[im 42/84]
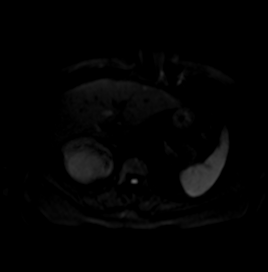
[im 84/84]
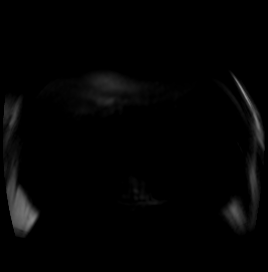

[Series 9: DWI · axial · 6.0mm · 1.49mm/px · z∈[-135,+160]mm · 2 of 42 slices shown (2 of 2)]
[im 1/42]
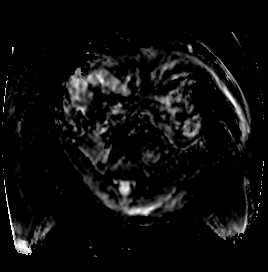
[im 42/42]
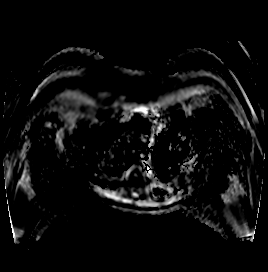

[Series 14: T1 dynamic · axial · 3.0mm · 1.25mm/px · z∈[-130,+155]mm · 3 of 96 slices shown (1 of 6)]
[im 1/96]
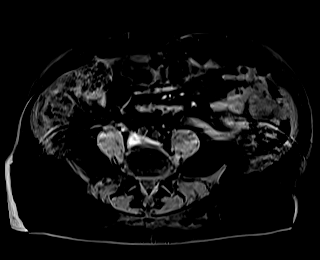
[im 48/96]
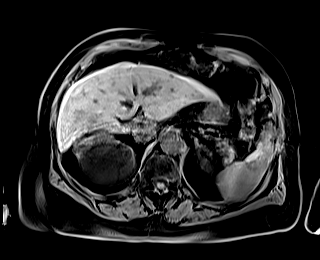
[im 96/96]
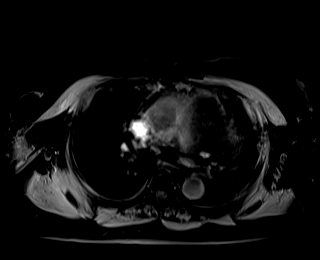

[Series 24: T1 dynamic · axial · 3.0mm · 1.25mm/px · z∈[-130,+155]mm · 3 of 96 slices shown (2 of 6)]
[im 1/96]
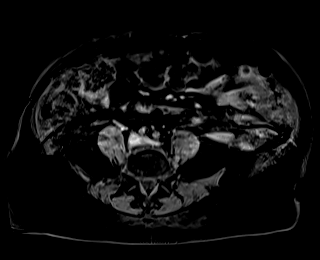
[im 48/96]
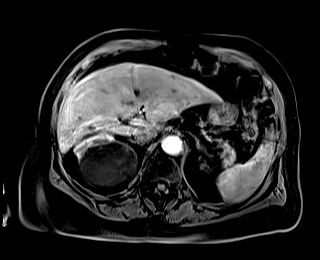
[im 96/96]
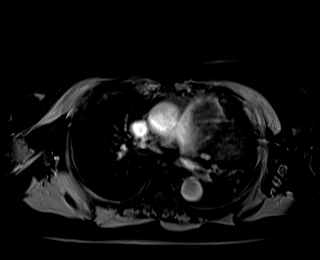

[Series 28: T1 dynamic · axial · 3.0mm · 1.25mm/px · z∈[-130,+155]mm · 3 of 96 slices shown (3 of 6)]
[im 1/96]
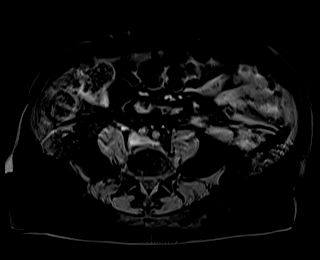
[im 48/96]
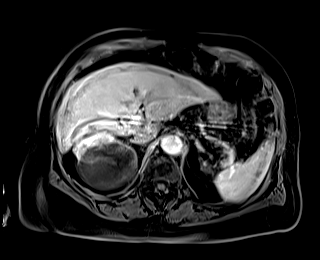
[im 96/96]
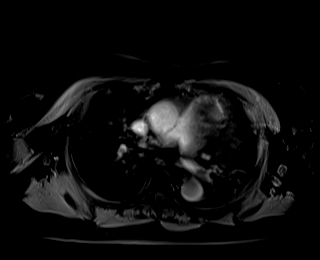

[Series 32: T1 dynamic · axial · 3.0mm · 1.25mm/px · z∈[-130,+155]mm · 3 of 96 slices shown (4 of 6)]
[im 1/96]
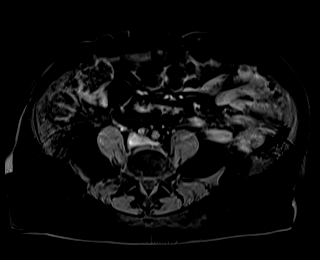
[im 48/96]
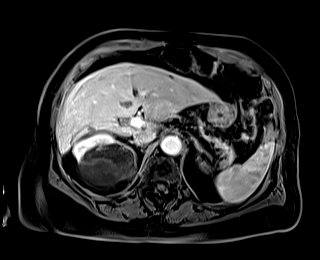
[im 96/96]
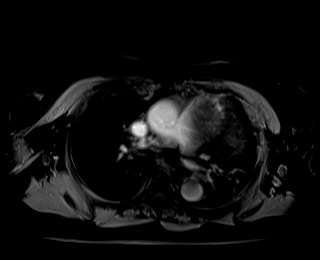

[Series 34: T1 dynamic · coronal · 5.0mm · 1.41mm/px · 2 of 52 slices shown (5 of 6)]
[im 1/52]
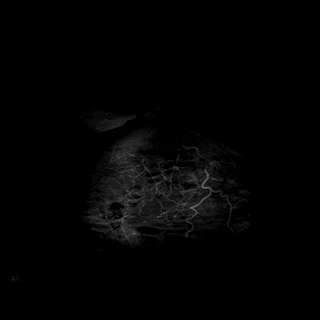
[im 52/52]
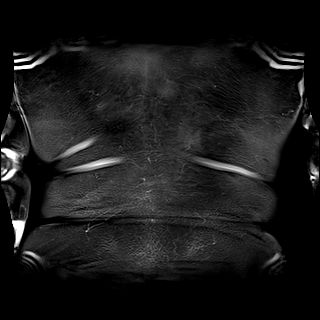

[Series 35: T2 · axial · 6.0mm · 1.56mm/px · z∈[-135,+160]mm · 2 of 42 slices shown (2 of 2)]
[im 1/42]
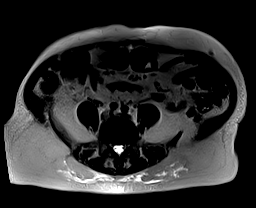
[im 42/42]
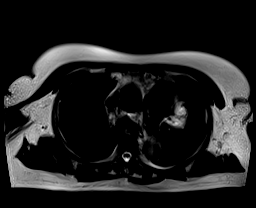

[Series 39: T1 dynamic · axial · 3.0mm · 1.25mm/px · z∈[-130,+155]mm · 3 of 96 slices shown (6 of 6)]
[im 1/96]
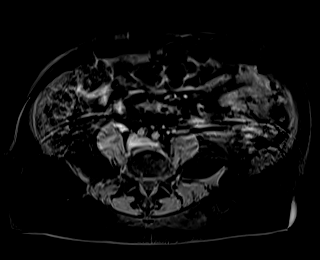
[im 48/96]
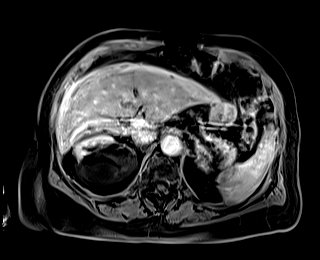
[im 96/96]
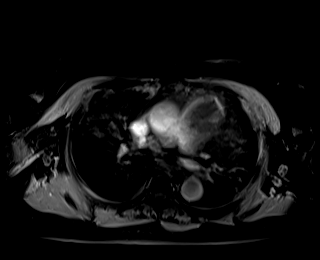

[Series 100: (id) · axial · 3.0mm · 1.25mm/px · z∈[-130,+155]mm · 3 of 96 slices shown (1 of 3)]
[im 1/96]
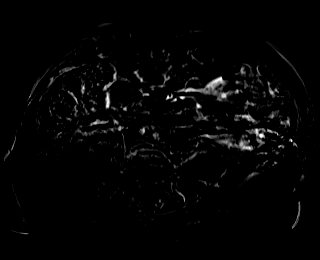
[im 48/96]
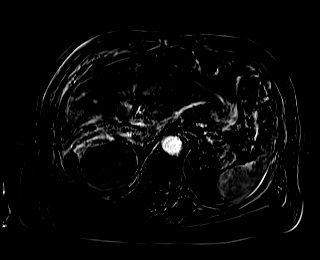
[im 96/96]
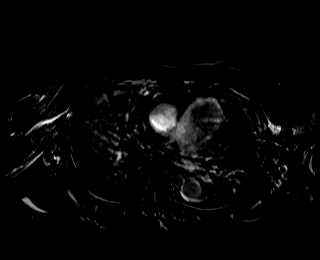

[Series 101: (id) · axial · 3.0mm · 1.25mm/px · z∈[-130,+155]mm · 3 of 96 slices shown (2 of 3)]
[im 1/96]
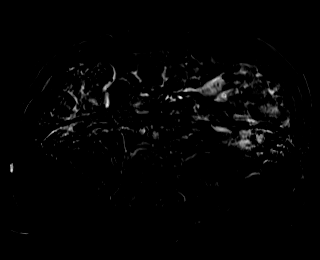
[im 48/96]
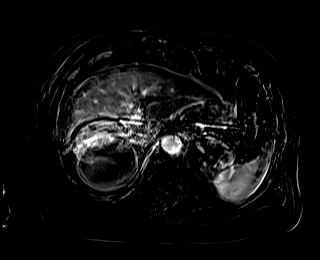
[im 96/96]
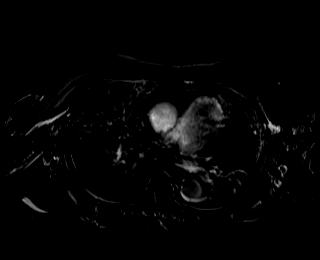

[Series 102: (id) · axial · 3.0mm · 1.25mm/px · z∈[-130,+155]mm · 3 of 96 slices shown (3 of 3)]
[im 1/96]
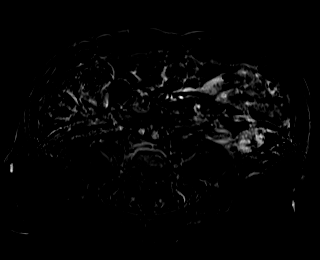
[im 48/96]
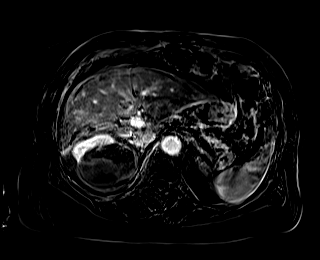
[im 96/96]
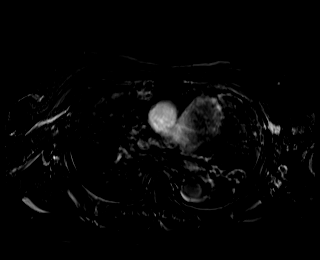

[Series 103: 3min · axial · 3.0mm · 1.25mm/px · z∈[-130,+155]mm · 3 of 96 slices shown]
[im 1/96]
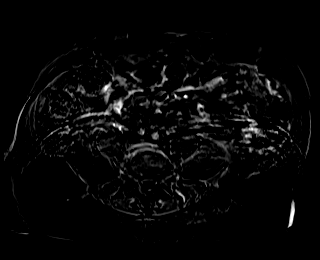
[im 48/96]
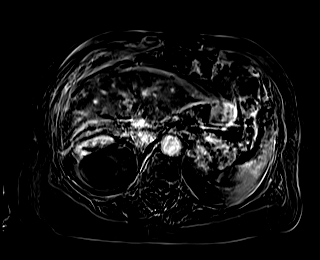
[im 96/96]
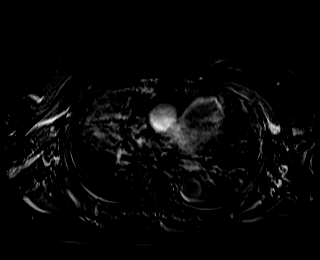

[48 of 48 positions shown; findings below may reference images not displayed]

FINDINGS: Portions of exam are moderately motion degraded.

Lower chest: Pulmonary artery enlargement, as on CT. Moderate
cardiomegaly with small pericardial effusion.

Hepatobiliary: No evidence of cirrhosis. Scattered liver lesion
hepatic cysts. No suspicious. Normal gallbladder, without biliary
ductal dilatation.

Pancreas: A cystic lesion within the pancreatic tail measures 8 mm
on [DATE]. No duct dilatation or acute inflammation.

Spleen:  Normal in size, without focal abnormality.

Adrenals/Urinary Tract: Normal adrenal glands. Bilateral T2
hyperintense renal lesions which are likely cysts. These are larger
on the right than left. Example lower pole right renal 4.0 cm cyst.
A dominant interpolar right renal minimally complex, multi septated
lesion versus adjacent lesions. Example at 6.8 cm on [DATE] and 9.6 cm
craniocaudal on [DATE]. After contrast, apparent anterior
hyperenhancement on 51/32, possibly artifactual. No hydronephrosis.

Stomach/Bowel: Proximal gastric underdistention. Large colonic stool
burden. Normal small bowel caliber.

Vascular/Lymphatic: Advanced aortic and branch vessel
atherosclerosis. No abdominal adenopathy.

Other:  No ascites.  Susceptibility artifact from prior laparotomy.

Musculoskeletal: Mild S shaped thoracolumbar spine curvature.
IMPRESSION: 1. Moderately motion degraded exam.
2. No evidence of cirrhosis or suspicious liver lesion.
3. Cardiomegaly with small pericardial effusion.
4. Pulmonary artery enlargement suggests pulmonary arterial
hypertension.
5. Right larger than left renal lesions, primarily felt to represent
cysts. An interpolar right renal multi septated lesion or adjacent
more simple lesions is/are indeterminate. Consider follow-up with
pre and post contrast abdominal MRI at 6 months. If the patient is
not felt able to breath hold and follow directions, renal protocol
CT could be performed.
6. A tiny pancreatic cystic lesion is most likely a pseudocyst. Per
consensus criteria, this warrants follow-up with pre and post
contrast abdominal MRI/MRCP at 2 years. Alternatively, this could be
re-evaluated on above follow-up CT. This recommendation follows ACR
consensus guidelines: Management of Incidental Pancreatic Cysts: A
White Paper of the ACR Incidental Findings Committee. [HOSPITAL] 2694;[DATE].
7.  Aortic Atherosclerosis ([id number]).

## 2022-04-13 ENCOUNTER — Other Ambulatory Visit: Payer: Self-pay | Admitting: Internal Medicine

## 2022-04-23 DIAGNOSIS — E890 Postprocedural hypothyroidism: Secondary | ICD-10-CM | POA: Diagnosis not present

## 2022-04-24 NOTE — Progress Notes (Unsigned)
Jade Sullivan [address] [place] Phone: [phone] Subjective:   IVilma Sullivan, am serving as a scribe for Dr. Hulan Saas.  I'm seeing this patient by the request  of:  Hoyt Koch, MD  CC: Bilateral knee pain  GEX:[id number]  02/28/2022 Patient did not respond extremely well to the viscosupplementation but does seem to get better improvement with the knee injection.  Bilateral injections given today.  We have been doing this intermittently over the course of last 6 years.  We discussed with patient that we can repeat every 8 weeks if necessary at this time.  Patient still declines any type of surgical intervention.  Discussed the possibility of other things such as Toradol injections intra-articular could be another option if necessary.  Follow-up with me again in 8 to 10 weeks social determinants of health is also does not have the support system for patient having surgery at this time      Update 04/25/2022 Jade Sullivan is a 77 y.o. female coming in with complaint of B knee pain. Patient states both knees hurt. Right is worse than left. Left knee is sore, but right swells and is tight.       Past Medical History:  Diagnosis Date   Allergy    seasonal allergies   Arthritis    bilateral knees   Cataract    not a surgical candidate at this time (05/29/2020)   Chronic kidney disease    COVID-19 04/2019   Diabetes mellitus without complication ([place])    on meds   Diverticulosis of colon (without mention of hemorrhage)    Dysmetabolic syndrome X    Glaucoma    on meds   Gout    H/O hyperthyroidism    By patient h/o hyperthyroidism with protosis, s/p subtotal thyroidectomy. On no replacement therapy. Last thyroid functions 2013 in [place]  Plan Will need thyroid functions Feb '15 with recommendations to follow.     Heart murmur    hx of   Hyperlipidemia    on meds   Hypertension    on multiple meds    Hypothyroidism    on meds   Kidney lesion    On supplemental oxygen by nasal cannula    at bedtime   Pancreatic cyst    Tubular adenoma of colon    Past Surgical History:  Procedure Laterality Date   BIOPSY  06/27/2020   Procedure: BIOPSY;  Surgeon: Jerene Bears, MD;  [facility]: [facility];  Service: Gastroenterology;;   [place]  2016   JMP-MAC-2 day suprep (good)-TICS/TA   COLONOSCOPY WITH PROPOFOL N/A 06/27/2020   Procedure: COLONOSCOPY WITH PROPOFOL;  Surgeon: Jerene Bears, MD;  [facility]: [facility];  Service: Gastroenterology;  Laterality: N/A;   EYE SURGERY     LIPOMA EXCISION Right 11/24/2014   Procedure: EXCISION RIGHT SHOULDER LIPOMA;  Surgeon: Erroll Luna, MD;  Location: [facility];  Service: General;  Laterality: Right;   POLYPECTOMY  2016   TA   POLYPECTOMY  06/27/2020   Procedure: POLYPECTOMY;  Surgeon: Jerene Bears, MD;  [facility];  Service: Gastroenterology;;   THYROIDECTOMY, PARTIAL  1981   TONSILLECTOMY     UMBILICAL HERNIA REPAIR  2000   WISDOM TOOTH EXTRACTION     Social History   Socioeconomic History   Marital status: Divorced    Spouse name: Not on file   Number of children:  1   Years of education: Not on file   Highest education level: Not on file  Occupational History   Occupation: Retired  Tobacco Use   Smoking status: Former   Smokeless tobacco: Never  Scientific laboratory technician Use: Never used  Substance and Sexual Activity   Alcohol use: No    Alcohol/week: 0.0 standard drinks of alcohol   Drug use: No   Sexual activity: Not Currently    Comment: 1st intercourse- 16, partners- 48, divorced  Other Topics Concern   Not on file  Social History Narrative   Divorced   Retired - Engineer, maintenance (IT)   Social Determinants of [place] Strain: [place]  (10/10/2021)   Overall Financial Resource Strain (CARDIA)    Difficulty of Paying Living Expenses: Not hard at all  Food  Insecurity: No [place] (12/20/2021)   Hunger Vital Sign    Worried About Running Out of Food in the Last Year: Never true    [place] in the Last Year: Never true  Transportation Needs: No Transportation Needs (12/20/2021)   PRAPARE - Hydrologist (Medical): No    Lack of Transportation (Non-Medical): No  Physical Activity: Sufficiently Active (10/10/2021)   Exercise Vital Sign    Days of Exercise per Week: 5 days    Minutes of Exercise per Session: 30 min  Stress: No Stress Concern Present (10/10/2021)   [place]    Feeling of Stress : Not at all  Social Connections: Moderately Integrated (10/10/2021)   Social Connection and Isolation Panel [NHANES]    Frequency of Communication with Friends and Family: More than three times a week    Frequency of Social Gatherings with Friends and Family: Twice a week    Attends Religious Services: More than 4 times per year    Active Member of [employer] or Organizations: Yes    Attends Music therapist: More than 4 times per year    Marital Status: Divorced   Allergies  Allergen Reactions   Amlodipine     Gingival hyperplasia   Hctz [Hydrochlorothiazide] Other (See Comments)    gout   Jardiance [Empagliflozin] Other (See Comments)    Yeast Infections   Family History  Problem Relation Age of Onset   Diabetes Mother        DM2   Hypertension Mother    Pneumonia Mother        died from this condition   Colon polyps Mother    Heart disease Father        died from this condition   Colon polyps Father    Kidney disease Sister        HBP and DM2; relative died from this condition   Colon polyps Sister    Heart disease Brother        MI - HBP and DM2   Colon polyps Brother    Colon polyps Brother    Colon cancer Neg Hx    Esophageal cancer Neg Hx    Rectal cancer Neg Hx    Stomach cancer Neg Hx     Current Outpatient  Medications (Endocrine & Metabolic):    levothyroxine (SYNTHROID) 50 MCG tablet, Take 1 tablet (50 mcg total) by mouth daily before breakfast.  Current Outpatient Medications (Cardiovascular):    atorvastatin (LIPITOR) 80 MG tablet, Take 1 tablet (80 mg total) by mouth daily.  cloNIDine (CATAPRES - DOSED IN MG/24 HR) 0.3 mg/24hr patch, Place 0.3 mg onto the skin every Wednesday.   cloNIDine (CATAPRES - DOSED IN MG/24 HR) 0.3 mg/24hr patch, 0.3 mg once a week.   cloNIDine (CATAPRES) 0.2 MG tablet, Take 0.2 mg by mouth 3 (three) times daily.   irbesartan (AVAPRO) 300 MG tablet, TAKE 1 TABLET(300 MG) BY MOUTH IN THE MORNING   minoxidil (LONITEN) 2.5 MG tablet, Take 5 mg by mouth in the morning, at noon, and at bedtime.   nebivolol (BYSTOLIC) 5 MG tablet, Take 5 mg by mouth in the morning.   spironolactone (ALDACTONE) 100 MG tablet, Take 100 mg by mouth every evening.   Current Outpatient Medications (Analgesics):    aspirin EC 81 MG tablet, Take 81 mg by mouth every evening. Swallow whole.   aspirin-acetaminophen-caffeine (EXCEDRIN MIGRAINE) 250-250-65 MG tablet, Take 1-2 tablets by mouth every 6 (six) hours as needed for headache (sinus headaches/pain).  Current Outpatient Medications (Hematological):    vitamin B-12 (CYANOCOBALAMIN) 500 MCG tablet, Take 1,000 mcg by mouth every evening.  Current Outpatient Medications (Other):    ALPRAZolam (XANAX) 0.25 MG tablet, Take 1 tablet (0.25 mg total) by mouth 2 (two) times daily as needed for anxiety (prior to dentist).   cholecalciferol (VITAMIN D3) 25 MCG (1000 UT) tablet, Take 1,000 Units by mouth daily.   diazepam (VALIUM) 5 MG tablet, Take 1 tablet an hour prior to MRI and may repeat if needed   fish oil-omega-3 fatty acids 1000 MG capsule, Take 2 g by mouth daily.   gabapentin (NEURONTIN) 100 MG capsule, Take 2 capsules (200 mg total) by mouth at bedtime.   latanoprost (XALATAN) 0.005 % ophthalmic solution, Place 1 drop into both eyes at  bedtime.    Multiple Vitamin (MULTIVITAMIN WITH MINERALS) TABS tablet, Take 1 tablet by mouth every evening.   timolol (TIMOPTIC) 0.5 % ophthalmic solution, Place 1 drop into both eyes 2 (two) times daily.   Reviewed prior external information including notes and imaging from  primary care provider As well as notes that were available from care everywhere and other healthcare systems.  Past medical history, social, surgical and family history all reviewed in electronic medical record.  No pertanent information unless stated regarding to the chief complaint.   Review of Systems:  No headache, visual changes, nausea, vomiting, diarrhea, constipation, dizziness, abdominal pain, skin rash, fevers, chills, night sweats, weight loss, swollen lymph nodes, body aches, joint swelling, chest pain, shortness of breath, mood changes. POSITIVE muscle aches  Objective  Blood pressure 128/74, pulse 72, height _0  (1.626 m), weight 199 lb (90.3 kg), SpO2 90 %.   General: No apparent distress alert and oriented x3 mood and affect normal, dressed appropriately.  HEENT: Pupils equal, extraocular movements intact  Respiratory: Patient's speak in full sentences and does not appear short of breath  Cardiovascular: No lower extremity edema, non tender, no erythema  Severely antalgic gait noted bilaterally.  Both knees do have significant arthritic changes noted.  Instability noted with valgus and varus force.  No crepitus noted.   After informed written and verbal consent, patient was seated on exam table. Right knee was prepped with alcohol swab and utilizing anterolateral approach, patient's right knee space was injected with 4:1  marcaine 0.5%: Kenalog 9m/dL. Patient tolerated the procedure well without immediate complications.  After informed written and verbal consent, patient was seated on exam table. Left knee was prepped with alcohol swab and utilizing anterolateral approach, patient's left knee  space  was injected with 4:1  marcaine 0.5%: Kenalog 72m/dL. Patient tolerated the procedure well without immediate complications. Impression and Recommendations:     The above documentation has been reviewed and is accurate and complete ZLyndal Pulley DO

## 2022-04-25 ENCOUNTER — Ambulatory Visit: Payer: Medicare Other | Admitting: Family Medicine

## 2022-04-25 VITALS — BP 128/74 | HR 72 | Ht 64.0 in | Wt 199.0 lb

## 2022-04-25 DIAGNOSIS — M170 Bilateral primary osteoarthritis of knee: Secondary | ICD-10-CM | POA: Diagnosis not present

## 2022-04-25 NOTE — Patient Instructions (Addendum)
Injections in knees today See you again in 9-10 weeks

## 2022-04-25 NOTE — Assessment & Plan Note (Signed)
Bilateral injections given April 25, 2022.  Chronic problem with worsening symptoms.  Still wants to avoid any type of surgical intervention.  Social determinants of health include patient is still a smoker at this point.  Patient is also not very active secondary to patient's discussed again with patient making some progress with viscosupplementation previously and we will see if we can get that approved.  Will follow-up again in 6 to 8 weeks

## 2022-04-26 ENCOUNTER — Encounter: Payer: Self-pay | Admitting: Podiatry

## 2022-05-07 DIAGNOSIS — E890 Postprocedural hypothyroidism: Secondary | ICD-10-CM | POA: Diagnosis not present

## 2022-05-07 DIAGNOSIS — E1122 Type 2 diabetes mellitus with diabetic chronic kidney disease: Secondary | ICD-10-CM | POA: Diagnosis not present

## 2022-05-07 DIAGNOSIS — N183 Chronic kidney disease, stage 3 unspecified: Secondary | ICD-10-CM | POA: Diagnosis not present

## 2022-05-07 DIAGNOSIS — Z0000 Encounter for general adult medical examination without abnormal findings: Secondary | ICD-10-CM | POA: Diagnosis not present

## 2022-05-07 DIAGNOSIS — Z23 Encounter for immunization: Secondary | ICD-10-CM | POA: Diagnosis not present

## 2022-05-07 DIAGNOSIS — I10 Essential (primary) hypertension: Secondary | ICD-10-CM | POA: Diagnosis not present

## 2022-05-07 DIAGNOSIS — I700 Atherosclerosis of aorta: Secondary | ICD-10-CM | POA: Diagnosis not present

## 2022-05-10 ENCOUNTER — Ambulatory Visit: Payer: Medicare Other | Admitting: Podiatry

## 2022-05-10 ENCOUNTER — Encounter: Payer: Self-pay | Admitting: Podiatry

## 2022-05-10 DIAGNOSIS — E1122 Type 2 diabetes mellitus with diabetic chronic kidney disease: Secondary | ICD-10-CM | POA: Diagnosis not present

## 2022-05-10 DIAGNOSIS — B351 Tinea unguium: Secondary | ICD-10-CM | POA: Diagnosis not present

## 2022-05-10 DIAGNOSIS — M79675 Pain in left toe(s): Secondary | ICD-10-CM | POA: Diagnosis not present

## 2022-05-10 DIAGNOSIS — Q828 Other specified congenital malformations of skin: Secondary | ICD-10-CM | POA: Diagnosis not present

## 2022-05-10 DIAGNOSIS — N1831 Chronic kidney disease, stage 3a: Secondary | ICD-10-CM

## 2022-05-10 DIAGNOSIS — M79674 Pain in right toe(s): Secondary | ICD-10-CM | POA: Diagnosis not present

## 2022-05-10 NOTE — Progress Notes (Signed)
This patient returns to my office for at risk foot care.  This patient requires this care by a professional since this patient will be at risk due to having diabetes and CKD.  This patient is unable to cut nails herself since the patient cannot reach hernails.These nails are painful walking and wearing shoes.  She also has a painful callus each forefoot.This patient presents for at risk foot care today.  General Appearance  Alert, conversant and in no acute stress.  Vascular  Dorsalis pedis and posterior tibial  pulses are palpable  bilaterally.  Capillary return is within normal limits  bilaterally. Temperature is within normal limits  bilaterally.  Neurologic  Senn-Weinstein monofilament wire test within normal limits  bilaterally. Muscle power within normal limits bilaterally.  Nails Thick disfigured discolored nails with subungual debris  from hallux to fifth toes bilaterally. No evidence of bacterial infection or drainage bilaterally.  Orthopedic  No limitations of motion  feet .  No crepitus or effusions noted.  No bony pathology or digital deformities noted.  Skin  normotropic skin noted bilaterally.  No signs of infections or ulcers noted.   Porokeratosis sub 3 left foot and sub 4 right foot.  Onychomycosis  Pain in right toes  Pain in left toes  Consent was obtained for treatment procedures.   Mechanical debridement of nails 1-5  bilaterally performed with a nail nipper.  Filed with dremel without incident.    Return office visit   3 months                   Told patient to return for periodic foot care and evaluation due to potential at risk complications.   Gardiner Barefoot DPM

## 2022-05-28 ENCOUNTER — Ambulatory Visit (HOSPITAL_BASED_OUTPATIENT_CLINIC_OR_DEPARTMENT_OTHER): Payer: Medicare Other | Admitting: Internal Medicine

## 2022-05-28 ENCOUNTER — Encounter (HOSPITAL_BASED_OUTPATIENT_CLINIC_OR_DEPARTMENT_OTHER): Payer: Self-pay | Admitting: Internal Medicine

## 2022-05-28 VITALS — BP 177/79 | HR 76 | Ht 64.0 in | Wt 200.5 lb

## 2022-05-28 DIAGNOSIS — E7841 Elevated Lipoprotein(a): Secondary | ICD-10-CM | POA: Diagnosis not present

## 2022-05-28 DIAGNOSIS — N1831 Chronic kidney disease, stage 3a: Secondary | ICD-10-CM

## 2022-05-28 DIAGNOSIS — E1169 Type 2 diabetes mellitus with other specified complication: Secondary | ICD-10-CM

## 2022-05-28 DIAGNOSIS — I152 Hypertension secondary to endocrine disorders: Secondary | ICD-10-CM | POA: Diagnosis not present

## 2022-05-28 DIAGNOSIS — E785 Hyperlipidemia, unspecified: Secondary | ICD-10-CM | POA: Diagnosis not present

## 2022-05-28 DIAGNOSIS — E1159 Type 2 diabetes mellitus with other circulatory complications: Secondary | ICD-10-CM

## 2022-05-28 NOTE — Progress Notes (Signed)
LIPID CLINIC CONSULT NOTE  Chief Complaint:  Manage dyslipidemia  Primary Care Physician: Marda Stalker, PA-C  Primary Cardiologist:  Early Osmond, MD  HPI:  Jade Sullivan is a 77 y.o. female who is being seen today for the evaluation of dyslipidemia at the request of Early Osmond, MD. Jade Sullivan is seen today for evaluation management of dyslipidemia.  She has a history of high cholesterol, type 2 diabetes, chronic kidney disease (stage IIIa) and hypertension but no known coronary disease or prior history of heart attack or stroke.  Lab work in August which showed total cholesterol 121, triglycerides 91, HDL 34 and LDL 69 on the background of atorvastatin 80 mg daily therapy.  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  She was referred specifically for evaluation and management of this.  She has been noted to have some aortic atherosclerosis.  PMHx:  Past Medical History:  Diagnosis Date   Allergy    seasonal allergies   Arthritis    bilateral knees   Cataract    not a surgical candidate at this time (05/29/2020)   Chronic kidney disease    COVID-19 04/2019   Diabetes mellitus without complication ([place])    on meds   Diverticulosis of colon (without mention of hemorrhage)    Dysmetabolic syndrome X    Glaucoma    on meds   Gout    H/O hyperthyroidism    By patient h/o hyperthyroidism with protosis, s/p subtotal thyroidectomy. On no replacement therapy. Last thyroid functions 2013 in [place]  Plan Will need thyroid functions Feb '15 with recommendations to follow.     Heart murmur    hx of   Hyperlipidemia    on meds   Hypertension    on multiple meds   Hypothyroidism    on meds   Kidney lesion    On supplemental oxygen by nasal cannula    at bedtime   Pancreatic cyst    Tubular adenoma of colon     Past Surgical History:  Procedure Laterality Date   BIOPSY  06/27/2020   Procedure: BIOPSY;  Surgeon: Jerene Bears, MD;  [facility];  Service: Gastroenterology;;   [place]  2016   JMP-MAC-2 day suprep (good)-TICS/TA   COLONOSCOPY WITH PROPOFOL N/A 06/27/2020   Procedure: COLONOSCOPY WITH PROPOFOL;  Surgeon: Jerene Bears, MD;  [facility];  Service: Gastroenterology;  Laterality: N/A;   EYE SURGERY     LIPOMA EXCISION Right 11/24/2014   Procedure: EXCISION RIGHT SHOULDER LIPOMA;  Surgeon: Erroll Luna, MD;  Location: [facility];  Service: General;  Laterality: Right;   POLYPECTOMY  2016   TA   POLYPECTOMY  06/27/2020   Procedure: POLYPECTOMY;  Surgeon: Jerene Bears, MD;  [facility];  Service: Gastroenterology;;   THYROIDECTOMY, PARTIAL  1981   TONSILLECTOMY     UMBILICAL HERNIA REPAIR  2000   WISDOM TOOTH EXTRACTION      FAMHx:  Family History  Problem Relation Age of Onset   Diabetes Mother        DM2   Hypertension Mother    Pneumonia Mother        died from this condition   Colon polyps Mother    Heart disease Father        died from this condition   Colon polyps Father    Kidney disease Sister  HBP and DM2; relative died from this condition   Colon polyps Sister    Heart disease Brother        MI - HBP and DM2   Colon polyps Brother    Colon polyps Brother    Colon cancer Neg Hx    Esophageal cancer Neg Hx    Rectal cancer Neg Hx    Stomach cancer Neg Hx     SOCHx:   reports that she has quit smoking. She has never used smokeless tobacco. She reports that she does not drink alcohol and does not use drugs.  ALLERGIES:  Allergies  Allergen Reactions   Amlodipine     Gingival hyperplasia   Hctz [Hydrochlorothiazide] Other (See Comments)    gout   Jardiance [Empagliflozin] Other (See Comments)    Yeast Infections    ROS: Pertinent items noted in HPI and remainder of comprehensive ROS otherwise negative.  HOME MEDS: Current Outpatient Medications on File Prior to Visit  Medication Sig Dispense Refill    ALPRAZolam (XANAX) 0.25 MG tablet Take 1 tablet (0.25 mg total) by mouth 2 (two) times daily as needed for anxiety (prior to dentist). 20 tablet 0   aspirin EC 81 MG tablet Take 81 mg by mouth every evening. Swallow whole.     aspirin-acetaminophen-caffeine (EXCEDRIN MIGRAINE) 250-250-65 MG tablet Take 1-2 tablets by mouth every 6 (six) hours as needed for headache (sinus headaches/pain).     atorvastatin (LIPITOR) 80 MG tablet Take 1 tablet (80 mg total) by mouth daily. 90 tablet 3   cholecalciferol (VITAMIN D3) 25 MCG (1000 UT) tablet Take 1,000 Units by mouth daily.     cloNIDine (CATAPRES - DOSED IN MG/24 HR) 0.3 mg/24hr patch Place 0.3 mg onto the skin every Wednesday.     cloNIDine (CATAPRES) 0.2 MG tablet Take 0.2 mg by mouth 3 (three) times daily.     diazepam (VALIUM) 5 MG tablet Take 1 tablet an hour prior to MRI and may repeat if needed 2 tablet 0   fish oil-omega-3 fatty acids 1000 MG capsule Take 2 g by mouth daily.     gabapentin (NEURONTIN) 100 MG capsule Take 2 capsules (200 mg total) by mouth at bedtime. 180 capsule 0   irbesartan (AVAPRO) 300 MG tablet TAKE 1 TABLET(300 MG) BY MOUTH IN THE MORNING 90 tablet 0   latanoprost (XALATAN) 0.005 % ophthalmic solution Place 1 drop into both eyes at bedtime.      levothyroxine (SYNTHROID) 75 MCG tablet Take 75 mcg by mouth daily before breakfast.     metformin (FORTAMET) 500 MG (OSM) 24 hr tablet Take 500 mg by mouth daily with breakfast.     minoxidil (LONITEN) 2.5 MG tablet Take 5 mg by mouth in the morning, at noon, and at bedtime.     Multiple Vitamin (MULTIVITAMIN WITH MINERALS) TABS tablet Take 1 tablet by mouth every evening.     nebivolol (BYSTOLIC) 5 MG tablet Take 5 mg by mouth in the morning.     spironolactone (ALDACTONE) 100 MG tablet Take 100 mg by mouth every evening.     timolol (TIMOPTIC) 0.5 % ophthalmic solution Place 1 drop into both eyes 2 (two) times daily.     vitamin B-12 (CYANOCOBALAMIN) 500 MCG tablet Take 1,000  mcg by mouth every evening.     No current facility-administered medications on file prior to visit.    LABS/IMAGING: No results found for this or any previous visit (from the past 48 hour(s)). No results  found.  LIPID PANEL:    Component Value Date/Time   CHOL 121 11/21/2021 0909   TRIG 91 11/21/2021 0909   HDL 34 (L) 11/21/2021 0909   CHOLHDL 3.6 11/21/2021 0909   CHOLHDL 4 05/17/2020 1050   VLDL 21.0 05/17/2020 1050   LDLCALC 69 11/21/2021 0909    WEIGHTS: Wt Readings from Last 3 Encounters:  05/28/22 200 lb 8 oz (90.9 kg)  04/25/22 199 lb (90.3 kg)  02/28/22 198 lb (89.8 kg)    VITALS: BP (!) 177/79 (BP Location: Right Arm, Patient Position: Sitting, Cuff Size: Large)   Pulse 76   Ht '5\' 4"'$  (1.626 m)   Wt 200 lb 8 oz (90.9 kg)   SpO2 100%   BMI 34.42 kg/m   EXAM: Deferred  EKG: Deferred  ASSESSMENT: Mixed dyslipidemia, 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   Ms. Feehan has a mixed dyslipidemia but is at target LDL less than 70 on 80 mg atorvastatin.  Her last lipids were in August and I like to repeat those to make sure she has been stable.  If her cholesterol is significantly elevated on the background of an elevated LP(a) therapy with cardiovascular risk factors, I would consider adding a PCSK9 inhibitor to her therapy.  If she continues to be well-controlled then I would defer additional therapies.  Ultimately if she has the unfortunate development of clinical coronary artery disease, MI or stroke, would strongly consider adding a PCSK9 inhibitor.  Thanks again for the kind referral.  Follow-up likely as needed unless additional therapies are added.  Pixie Casino, MD, [facility], [place] Director of the Advanced Lipid Disorders &  Cardiovascular Risk Reduction Clinic Diplomate of the American Board of Clinical Lipidology Attending  Cardiologist  Direct Dial: [phone]  Fax: [phone]  Website:  [email or web address] [email or web address] Shakaya Bhullar 05/28/2022, 10:42 AM

## 2022-05-28 NOTE — Patient Instructions (Signed)
Medication Instructions:  NO CHANGES today   *If you need a refill on your cardiac medications before your next appointment, please call your pharmacy*   Lab Work: NMR lipoprofile to check cholesterol   If you have labs (blood work) drawn today and your tests are completely normal, you will receive your results only by: [place] (if you have MyChart) OR A paper copy in the mail If you have any lab test that is abnormal or we need to change your treatment, we will call you to review the results.  Follow-Up: At [facility], you and your health needs are our priority.  As part of our continuing mission to provide you with exceptional heart care, we have created designated Provider Care Teams.  These Care Teams include your primary Cardiologist (physician) and Advanced Practice Providers (APPs -  Physician Assistants and Nurse Practitioners) who all work together to provide you with the care you need, when you need it.  We recommend signing up for the patient portal called "MyChart".  Sign up information is provided on this After Visit Summary.  MyChart is used to connect with patients for Virtual Visits (Telemedicine).  Patients are able to view lab/test results, encounter notes, upcoming appointments, etc.  Non-urgent messages can be sent to your provider as well.   To learn more about what you can do with MyChart, go to [email or web address].    Your next appointment:    AS NEEDED with Dr. Debara Pickett

## 2022-05-30 LAB — NMR, LIPOPROFILE
Cholesterol, Total: 132 mg/dL (ref 100–199)
HDL Particle Number: 25.1 umol/L — ABNORMAL LOW (ref >=30.5)
HDL-C: 39 mg/dL — ABNORMAL LOW (ref >39)
LDL Particle Number: 1016 nmol/L — ABNORMAL HIGH (ref <1000)
LDL Size: 20.9 nm (ref >20.5)
LDL-C (NIH Calc): 77 mg/dL (ref 0–99)
LP-IR Score: 61 — ABNORMAL HIGH (ref <=45)
Small LDL Particle Number: 516 nmol/L (ref <=527)
Triglycerides: 81 mg/dL (ref 0–149)

## 2022-06-05 ENCOUNTER — Telehealth: Payer: Self-pay | Admitting: Internal Medicine

## 2022-06-05 DIAGNOSIS — E7841 Elevated Lipoprotein(a): Secondary | ICD-10-CM

## 2022-06-05 DIAGNOSIS — E785 Hyperlipidemia, unspecified: Secondary | ICD-10-CM

## 2022-06-05 NOTE — Telephone Encounter (Signed)
Pt called in requesting to speak to RN about lab results

## 2022-06-05 NOTE — Telephone Encounter (Signed)
Called and spoke with pt. Explained the need to try injectables for some patients. She verbalized understanding and is willing to try it. She is aware she will have to have labs drawn in 3-+4 months and requested a reminder when it's time to get the labs drawn. Will get message to nurse to move forward with this process.

## 2022-06-05 NOTE — Telephone Encounter (Signed)
Prior auth for Repatha Sureclick '140mg'$ /mL OR Praluent '150mg'$ /mL needed for patient.   Dx: dyslipidemia, diabetes, elevated LPa

## 2022-06-06 ENCOUNTER — Other Ambulatory Visit (HOSPITAL_COMMUNITY): Payer: Self-pay

## 2022-06-06 ENCOUNTER — Telehealth: Payer: Self-pay

## 2022-06-06 NOTE — Telephone Encounter (Signed)
Pharmacy Patient Advocate Encounter   Received notification from RN that prior authorization for Repatha '140mg'$ /ml is required/requested.     PA submitted on 3.7.24 to (ins) [employer] via CoverMyMeds  Key # [id number]  Status is pending

## 2022-06-07 NOTE — Telephone Encounter (Signed)
Pharmacy Patient Advocate Encounter  Prior Authorization for Repatha '140mg'$ /ml has been approved by [employer] (ins).    PA # [id number]   Effective dates: 3.7.24 through 9.7.24

## 2022-06-07 NOTE — Telephone Encounter (Signed)
Pharmacy Patient Advocate Encounter  Prior Authorization for Repatha '140MG'$ /ML has been approved by [employer] (ins).    PA # [id number] Effective dates: 3.7.24 through 9.7.24

## 2022-06-10 MED ORDER — REPATHA SURECLICK 140 MG/ML ~~LOC~~ SOAJ: 1.0000 mL | SUBCUTANEOUS | 11 refills | Status: DC

## 2022-06-10 NOTE — Telephone Encounter (Signed)
PA submitted, approved. Rx sent to pharmacy. LM for patient and MyChart message sent

## 2022-06-10 NOTE — Telephone Encounter (Signed)
Left message to call back about med approval MyChart message sent.

## 2022-06-13 ENCOUNTER — Encounter: Payer: Self-pay | Admitting: Obstetrics & Gynecology

## 2022-06-13 ENCOUNTER — Ambulatory Visit (INDEPENDENT_AMBULATORY_CARE_PROVIDER_SITE_OTHER): Payer: Medicare Other | Admitting: Obstetrics & Gynecology

## 2022-06-13 VITALS — BP 112/64 | HR 63 | Ht 64.5 in | Wt 196.0 lb

## 2022-06-13 DIAGNOSIS — Z9189 Other specified personal risk factors, not elsewhere classified: Secondary | ICD-10-CM | POA: Diagnosis not present

## 2022-06-13 DIAGNOSIS — R87610 Atypical squamous cells of undetermined significance on cytologic smear of cervix (ASC-US): Secondary | ICD-10-CM

## 2022-06-13 DIAGNOSIS — Z01419 Encounter for gynecological examination (general) (routine) without abnormal findings: Secondary | ICD-10-CM

## 2022-06-13 DIAGNOSIS — Z780 Asymptomatic menopausal state: Secondary | ICD-10-CM

## 2022-06-13 NOTE — Progress Notes (Signed)
TRESS MOLTER June 15, 1945 [id number]   History:    77 y.o.  G1P1L1 Divorced.  Son moved out of state for a job.   RP:  Established patient presenting for annual gyn exam    HPI: Postmenopause, well on no hormone replacement therapy.  No postmenopausal bleeding.  No pelvic pain.  Abstinent. Pap 03/2020 Neg. No indication to repeat Pap at this time.  Urine and bowel movements normal.  Breasts normal.  Mammo 12/2021 Neg. Body mass index 33.12.  Good muscle mass and bone mass.  Exercising regularly with water aerobics and weightlifting. Healthy nutrition.  Health labs with nephrologist. Stage II renal disease. Colonoscopy in 05/2020.  BD normal in 06/2021. Sleep apnea using the machine.   Past medical history,surgical history, family history and social history were all reviewed and documented in the EPIC chart.  Gynecologic History No LMP recorded. Patient is postmenopausal.  Obstetric History OB History  Gravida Para Term Preterm AB Living  '1 1 1     1  '$ SAB IAB Ectopic Multiple Live Births               # Outcome Date GA Lbr Len/2nd Weight Sex Delivery Anes PTL Lv  1 Term              ROS: A ROS was performed and pertinent positives and negatives are included in the history. GENERAL: No fevers or chills. HEENT: No change in vision, no earache, sore throat or sinus congestion. NECK: No pain or stiffness. CARDIOVASCULAR: No chest pain or pressure. No palpitations. PULMONARY: No shortness of breath, cough or wheeze. GASTROINTESTINAL: No abdominal pain, nausea, vomiting or diarrhea, melena or bright red blood per rectum. GENITOURINARY: No urinary frequency, urgency, hesitancy or dysuria. MUSCULOSKELETAL: No joint or muscle pain, no back pain, no recent trauma. DERMATOLOGIC: No rash, no itching, no lesions. ENDOCRINE: No polyuria, polydipsia, no heat or cold intolerance. No recent change in weight. HEMATOLOGICAL: No anemia or easy bruising or bleeding. NEUROLOGIC: No headache, seizures,  numbness, tingling or weakness. PSYCHIATRIC: No depression, no loss of interest in normal activity or change in sleep pattern.     Exam:   BP 112/64   Pulse 63   Ht 5' 4.5" (1.638 m)   Wt 196 lb (88.9 kg)   SpO2 99%   BMI 33.12 kg/m   Body mass index is 33.12 kg/m.  General appearance : Well developed well nourished female. No acute distress HEENT: Eyes: no retinal hemorrhage or exudates,  Neck supple, trachea midline, no carotid bruits, no thyroidmegaly Lungs: Clear to auscultation, no rhonchi or wheezes, or rib retractions  Heart: Regular rate and rhythm, no murmurs or gallops Breast:Examined in sitting and supine position were symmetrical in appearance, no palpable masses or tenderness,  no skin retraction, no nipple inversion, no nipple discharge, no skin discoloration, no axillary or supraclavicular lymphadenopathy Abdomen: no palpable masses or tenderness, no rebound or guarding Extremities: no edema or skin discoloration or tenderness  Pelvic: Vulva: Normal             Vagina: No gross lesions or discharge  Cervix: No gross lesions or discharge  Uterus  AV, normal size, shape and consistency, non-tender and mobile  Adnexa  Without masses or tenderness  Anus: Normal   Assessment/Plan:  77 y.o. female for annual exam   1. Well female exam with routine gynecological exam Postmenopause, well on no hormone replacement therapy.  No postmenopausal bleeding.  No pelvic pain.  Abstinent. Pap 03/2020  Neg. No indication to repeat Pap at this time.  Urine and bowel movements normal.  Breasts normal.  Mammo 12/2021 Neg. Body mass index 33.12.  Good muscle mass and bone mass.  Exercising regularly with water aerobics and weightlifting. Healthy nutrition.  Health labs with nephrologist. Stage II renal disease. Colonoscopy in 05/2020.  BD normal in 06/2021. Sleep apnea using the machine.  2. Postmenopausal Postmenopause, well on no hormone replacement therapy.  No postmenopausal bleeding.   No pelvic pain.  Abstinent.  3. Other specified personal risk factors, not elsewhere classified   Princess Bruins MD, 10:52 AM

## 2022-06-19 NOTE — Progress Notes (Unsigned)
[facility] [place] Phone: [phone] Subjective:   [name], am serving as a scribe for Dr. Hulan Saas.  I'm seeing this patient by the request  of:  Marda Stalker, PA-C  CC: Right knee pain  [id number]  04/25/2022 Bilateral injections given April 25, 2022.  Chronic problem with worsening symptoms.  Still wants to avoid any type of surgical intervention.  Social determinants of health include patient is still a smoker at this point.  Patient is also not very active secondary to patient's discussed again with patient making some progress with viscosupplementation previously and we will see if we can get that approved.  Will follow-up again in 6 to 8 weeks      Update 06/20/2022 ESTELLAR GRAFF is a 77 y.o. female coming in with complaint of R knee pain. Patient states that some days are better than others. Did have relief from injections last visit but the relief is short lived. Pain is worse in back of R knee but improves when she moves around.       Past Medical History:  Diagnosis Date   Allergy    seasonal allergies   Arthritis    bilateral knees   Cataract    not a surgical candidate at this time (05/29/2020)   Chronic kidney disease    COVID-19 04/2019   Diabetes mellitus without complication ([place])    on meds   Diverticulosis of colon (without mention of hemorrhage)    Dysmetabolic syndrome X    Glaucoma    on meds   Gout    H/O hyperthyroidism    By patient h/o hyperthyroidism with protosis, s/p subtotal thyroidectomy. On no replacement therapy. Last thyroid functions 2013 in [place]  Plan Will need thyroid functions Feb '15 with recommendations to follow.     Heart murmur    hx of   Hyperlipidemia    on meds   Hypertension    on multiple meds   Hypothyroidism    on meds   Kidney lesion    On supplemental oxygen by nasal cannula    at bedtime   Pancreatic cyst    Tubular  adenoma of colon    Past Surgical History:  Procedure Laterality Date   BIOPSY  06/27/2020   Procedure: BIOPSY;  Surgeon: Jerene Bears, MD;  Location: [facility];  Service: Gastroenterology;;   [place]  2016   JMP-MAC-2 day suprep (good)-TICS/TA   COLONOSCOPY WITH PROPOFOL N/A 06/27/2020   Procedure: COLONOSCOPY WITH PROPOFOL;  Surgeon: Jerene Bears, MD;  Location: [facility];  Service: Gastroenterology;  Laterality: N/A;   EYE SURGERY     LIPOMA EXCISION Right 11/24/2014   Procedure: EXCISION RIGHT SHOULDER LIPOMA;  Surgeon: Erroll Luna, MD;  Location: [facility];  Service: General;  Laterality: Right;   POLYPECTOMY  2016   TA   POLYPECTOMY  06/27/2020   Procedure: POLYPECTOMY;  Surgeon: Jerene Bears, MD;  Location: [facility];  Service: Gastroenterology;;   THYROIDECTOMY, PARTIAL  1981   TONSILLECTOMY     UMBILICAL HERNIA REPAIR  2000   WISDOM TOOTH EXTRACTION     Social History   Socioeconomic History   Marital status: Divorced    Spouse name: Not on file   Number of children: 1   Years of education: Not on file   Highest education level: Not on file  Occupational History  Occupation: Retired  Tobacco Use   Smoking status: Former   Smokeless tobacco: Never  Scientific laboratory technician Use: Never used  Substance and Sexual Activity   Alcohol use: No    Alcohol/week: 0.0 standard drinks of alcohol   Drug use: No   Sexual activity: Not Currently    Partners: Male    Birth control/protection: Post-menopausal    Comment: 1st intercourse- 16, partners- 17, divorced  Other Topics Concern   Not on file  Social History Narrative   Divorced   Retired - Engineer, maintenance (IT)   Social Determinants of Health   Financial Resource Strain: [place]  (10/10/2021)   Overall Financial Resource Strain (CARDIA)    Difficulty of Paying Living Expenses: Not hard at all  Food Insecurity: No Food Insecurity (12/20/2021)   Hunger Vital Sign     Worried About Running Out of Food in the Last Year: Never true    [place] in the Last Year: Never true  Transportation Needs: No Transportation Needs (12/20/2021)   PRAPARE - Hydrologist (Medical): No    Lack of Transportation (Non-Medical): No  Physical Activity: Sufficiently Active (10/10/2021)   Exercise Vital Sign    Days of Exercise per Week: 5 days    Minutes of Exercise per Session: 30 min  Stress: No Stress Concern Present (10/10/2021)   [place]    Feeling of Stress : Not at all  Social Connections: Moderately Integrated (10/10/2021)   Social Connection and Isolation Panel [NHANES]    Frequency of Communication with Friends and Family: More than three times a week    Frequency of Social Gatherings with Friends and Family: Twice a week    Attends Religious Services: More than 4 times per year    Active Member of [employer] or Organizations: Yes    Attends Music therapist: More than 4 times per year    Marital Status: Divorced   Allergies  Allergen Reactions   Amlodipine     Gingival hyperplasia   Hctz [Hydrochlorothiazide] Other (See Comments)    gout   Jardiance [Empagliflozin] Other (See Comments)    Yeast Infections   Family History  Problem Relation Age of Onset   Diabetes Mother        DM2   Hypertension Mother    Pneumonia Mother        died from this condition   Colon polyps Mother    Heart disease Father        died from this condition   Colon polyps Father    Kidney disease Sister        HBP and DM2; relative died from this condition   Colon polyps Sister    Heart disease Brother        MI - HBP and DM2   Colon polyps Brother    Colon polyps Brother     Current Outpatient Medications (Endocrine & Metabolic):    levothyroxine (SYNTHROID) 75 MCG tablet, Take 75 mcg by mouth daily before breakfast.   metformin (FORTAMET) 500 MG (OSM) 24 hr  tablet, Take 500 mg by mouth daily with breakfast.  Current Outpatient Medications (Cardiovascular):    atorvastatin (LIPITOR) 80 MG tablet, Take 1 tablet (80 mg total) by mouth daily.   cloNIDine (CATAPRES - DOSED IN MG/24 HR) 0.3 mg/24hr patch, Place 0.3 mg onto the skin every Wednesday.   cloNIDine (CATAPRES) 0.2  MG tablet, Take 0.2 mg by mouth 3 (three) times daily.   Evolocumab (REPATHA SURECLICK) XX123456 MG/ML SOAJ, Inject 140 mg into the skin every 14 (fourteen) days.   irbesartan (AVAPRO) 300 MG tablet, TAKE 1 TABLET(300 MG) BY MOUTH IN THE MORNING   minoxidil (LONITEN) 2.5 MG tablet, Take 5 mg by mouth in the morning, at noon, and at bedtime.   nebivolol (BYSTOLIC) 5 MG tablet, Take 5 mg by mouth in the morning.   spironolactone (ALDACTONE) 100 MG tablet, Take 100 mg by mouth every evening.   Current Outpatient Medications (Analgesics):    aspirin EC 81 MG tablet, Take 81 mg by mouth every evening. Swallow whole.   aspirin-acetaminophen-caffeine (EXCEDRIN MIGRAINE) 250-250-65 MG tablet, Take 1-2 tablets by mouth every 6 (six) hours as needed for headache (sinus headaches/pain).  Current Outpatient Medications (Hematological):    vitamin B-12 (CYANOCOBALAMIN) 500 MCG tablet, Take 1,000 mcg by mouth every evening.  Current Outpatient Medications (Other):    ALPRAZolam (XANAX) 0.25 MG tablet, Take 1 tablet (0.25 mg total) by mouth 2 (two) times daily as needed for anxiety (prior to dentist).   cholecalciferol (VITAMIN D3) 25 MCG (1000 UT) tablet, Take 1,000 Units by mouth daily.   diazepam (VALIUM) 5 MG tablet, Take 1 tablet an hour prior to MRI and may repeat if needed   fish oil-omega-3 fatty acids 1000 MG capsule, Take 2 g by mouth daily.   gabapentin (NEURONTIN) 100 MG capsule, Take 2 capsules (200 mg total) by mouth at bedtime.   latanoprost (XALATAN) 0.005 % ophthalmic solution, Place 1 drop into both eyes at bedtime.    Multiple Vitamin (MULTIVITAMIN WITH MINERALS) TABS tablet,  Take 1 tablet by mouth every evening.   timolol (TIMOPTIC) 0.5 % ophthalmic solution, Place 1 drop into both eyes 2 (two) times daily.   Reviewed prior external information including notes and imaging from  primary care provider As well as notes that were available from care everywhere and other healthcare systems.  Past medical history, social, surgical and family history all reviewed in electronic medical record.  No pertanent information unless stated regarding to the chief complaint.   Review of Systems:  No headache, visual changes, nausea, vomiting, diarrhea, constipation, dizziness, abdominal pain, skin rash, fevers, chills, night sweats, weight loss, swollen lymph nodes, body aches, joint swelling, chest pain, shortness of breath, mood changes. POSITIVE muscle aches  Objective  Blood pressure 122/84, pulse 62, height 5' 4.5" (1.638 m), weight 197 lb (89.4 kg), SpO2 97 %.   General: No apparent distress alert and oriented x3 mood and affect normal, dressed appropriately.  HEENT: Pupils equal, extraocular movements intact  Respiratory: Patient's speak in full sentences and does not appear short of breath  Cardiovascular: No lower extremity edema, non tender, no erythema  Antalgic gait noted.  Does have crepitus of the knees bilaterally.  Does have some instability with valgus and varus force.   After informed written and verbal consent, patient was seated on exam table. Right knee was prepped with alcohol swab and utilizing anterolateral approach, patient's right knee space was injected with 60 mg per 3 mL of Durolane (sodium hyaluronate) in a prefilled syringe was injected easily into the knee through a 22-gauge needle..Patient tolerated the procedure well without immediate complications.  After informed written and verbal consent, patient was seated on exam table. Left knee was prepped with alcohol swab and utilizing anterolateral approach, patient's left knee space was injected with  60 mg per 3 mL of  Durolane (sodium hyaluronate) in a prefilled syringe was injected easily into the knee through a 22-gauge needle..Patient tolerated the procedure well without immediate complications.   Impression and Recommendations:    The above documentation has been reviewed and is accurate and complete Lyndal Pulley, DO

## 2022-06-20 ENCOUNTER — Ambulatory Visit: Payer: Medicare Other | Admitting: Family Medicine

## 2022-06-20 VITALS — BP 122/84 | HR 62 | Ht 64.5 in | Wt 197.0 lb

## 2022-06-20 DIAGNOSIS — M170 Bilateral primary osteoarthritis of knee: Secondary | ICD-10-CM | POA: Diagnosis not present

## 2022-06-20 MED ORDER — SODIUM HYALURONATE 60 MG/3ML IX PRSY
120.0000 mg | PREFILLED_SYRINGE | Freq: Once | INTRA_ARTICULAR | Status: AC
  Administered 2022-06-20: 120 mg via INTRA_ARTICULAR

## 2022-06-20 NOTE — Patient Instructions (Signed)
B gel injection in knees See you again in 3 months

## 2022-06-20 NOTE — Assessment & Plan Note (Signed)
Patient is doing very well with this previously but this chronic problem with exacerbation and worsening symptoms.  Hopefully that this will make improvement again.  Discussed icing regimen and home exercises.  Discussed which activities to do and which ones to avoid.  Follow-up again in 3 months

## 2022-07-07 ENCOUNTER — Other Ambulatory Visit: Payer: Self-pay | Admitting: Internal Medicine

## 2022-07-08 DIAGNOSIS — H401131 Primary open-angle glaucoma, bilateral, mild stage: Secondary | ICD-10-CM | POA: Diagnosis not present

## 2022-08-08 ENCOUNTER — Encounter: Payer: Self-pay | Admitting: Podiatry

## 2022-08-08 ENCOUNTER — Ambulatory Visit: Payer: Medicare Other | Admitting: Podiatry

## 2022-08-08 DIAGNOSIS — N1831 Chronic kidney disease, stage 3a: Secondary | ICD-10-CM | POA: Diagnosis not present

## 2022-08-08 DIAGNOSIS — B351 Tinea unguium: Secondary | ICD-10-CM | POA: Diagnosis not present

## 2022-08-08 DIAGNOSIS — M79674 Pain in right toe(s): Secondary | ICD-10-CM

## 2022-08-08 DIAGNOSIS — Q828 Other specified congenital malformations of skin: Secondary | ICD-10-CM | POA: Diagnosis not present

## 2022-08-08 DIAGNOSIS — E1122 Type 2 diabetes mellitus with diabetic chronic kidney disease: Secondary | ICD-10-CM

## 2022-08-08 DIAGNOSIS — M79675 Pain in left toe(s): Secondary | ICD-10-CM

## 2022-08-08 NOTE — Progress Notes (Signed)
This patient returns to my office for at risk foot care.  This patient requires this care by a professional since this patient will be at risk due to having diabetes and CKD.  This patient is unable to cut nails herself since the patient cannot reach hernails.These nails are painful walking and wearing shoes.  She also has a painful callus each forefoot.This patient presents for at risk foot care today. ? ?General Appearance  Alert, conversant and in no acute stress. ? ?Vascular  Dorsalis pedis and posterior tibial  pulses are palpable  bilaterally.  Capillary return is within normal limits  bilaterally. Temperature is within normal limits  bilaterally. ? ?Neurologic  Senn-Weinstein monofilament wire test within normal limits  bilaterally. Muscle power within normal limits bilaterally. ? ?Nails Thick disfigured discolored nails with subungual debris  from hallux to fifth toes bilaterally. No evidence of bacterial infection or drainage bilaterally. ? ?Orthopedic  No limitations of motion  feet .  No crepitus or effusions noted.  No bony pathology or digital deformities noted. ? ?Skin  normotropic skin noted bilaterally.  No signs of infections or ulcers noted.   Porokeratosis sub 3 left foot and sub 4 right foot. ? ?Onychomycosis  Pain in right toes  Pain in left toes ? ?Consent was obtained for treatment procedures.   Mechanical debridement of nails 1-5  bilaterally performed with a nail nipper.  Filed with dremel without incident.  ? ? ?Return office visit   3 months                   Told patient to return for periodic foot care and evaluation due to potential at risk complications. ? ? ?Fenton Candee DPM   ?

## 2022-09-11 NOTE — Progress Notes (Signed)
[name] [address] [place] [address] Phone: [phone] Subjective:   [name], am serving as a scribe for Dr. Antoine Primas.    I'm seeing this patient by the request  of:  Jarrett Soho, PA-C  CC: Bilateral knee pain  [id number]  06/20/2022 Patient is doing very well with this previously but this chronic problem with exacerbation and worsening symptoms.  Hopefully that this will make improvement again.  Discussed icing regimen and home exercises.  Discussed which activities to do and which ones to avoid.  Follow-up again in 3 months     Update 09/12/2022 Jade Sullivan is a 77 y.o. female coming in with complaint of B knee pain. Patient states knees doing so so. Here for injections. Gets stiff in the morning.    Past Medical History:  Diagnosis Date   Allergy    seasonal allergies   Arthritis    bilateral knees   Cataract    not a surgical candidate at this time (05/29/2020)   Chronic kidney disease    COVID-19 04/2019   Diabetes mellitus without complication (HCC)    on meds   Diverticulosis of colon (without mention of hemorrhage)    Dysmetabolic syndrome X    Glaucoma    on meds   Gout    H/O hyperthyroidism    By patient h/o hyperthyroidism with protosis, s/p subtotal thyroidectomy. On no replacement therapy. Last thyroid functions 2013 in [place]  Plan Will need thyroid functions Feb '15 with recommendations to follow.     Heart murmur    hx of   Hyperlipidemia    on meds   Hypertension    on multiple meds   Hypothyroidism    on meds   Kidney lesion    On supplemental oxygen by nasal cannula    at bedtime   Pancreatic cyst    Tubular adenoma of colon    Past Surgical History:  Procedure Laterality Date   BIOPSY  06/27/2020   Procedure: BIOPSY;  Surgeon: Beverley Fiedler, MD;  Location: [facility];  Service: Gastroenterology;;   CESAREAN SECTION  1969   COLONOSCOPY  2016   JMP-MAC-2 day suprep  (good)-TICS/TA   COLONOSCOPY WITH PROPOFOL N/A 06/27/2020   Procedure: COLONOSCOPY WITH PROPOFOL;  Surgeon: Beverley Fiedler, MD;  Location: [facility];  Service: Gastroenterology;  Laterality: N/A;   EYE SURGERY     LIPOMA EXCISION Right 11/24/2014   Procedure: EXCISION RIGHT SHOULDER LIPOMA;  Surgeon: Harriette Bouillon, MD;  Location: [facility];  Service: General;  Laterality: Right;   POLYPECTOMY  2016   TA   POLYPECTOMY  06/27/2020   Procedure: POLYPECTOMY;  Surgeon: Beverley Fiedler, MD;  Location: [facility];  Service: Gastroenterology;;   THYROIDECTOMY, PARTIAL  1981   TONSILLECTOMY     UMBILICAL HERNIA REPAIR  2000   WISDOM TOOTH EXTRACTION     Social History   Socioeconomic History   Marital status: Divorced    Spouse name: Not on file   Number of children: 1   Years of education: Not on file   Highest education level: Not on file  Occupational History   Occupation: Retired  Tobacco Use   Smoking status: Former   Smokeless tobacco: Never  Building services engineer Use: Never used  Substance and Sexual Activity   Alcohol use: No    Alcohol/week: 0.0 standard drinks of alcohol   Drug use: No  Sexual activity: Not Currently    Partners: Male    Birth control/protection: Post-menopausal    Comment: 1st intercourse- 31, partners- 5, divorced  Other Topics Concern   Not on file  Social History Narrative   Divorced   Retired - Engineer, petroleum   Social Determinants of Health   Financial Resource Strain: Low Risk  (10/10/2021)   Overall Financial Resource Strain (CARDIA)    Difficulty of Paying Living Expenses: Not hard at all  Food Insecurity: No Food Insecurity (12/20/2021)   Hunger Vital Sign    Worried About Running Out of Food in the Last Year: Never true    Ran Out of Food in the Last Year: Never true  Transportation Needs: No Transportation Needs (12/20/2021)   PRAPARE - Administrator, Civil Service (Medical): No    Lack of Transportation  (Non-Medical): No  Physical Activity: Sufficiently Active (10/10/2021)   Exercise Vital Sign    Days of Exercise per Week: 5 days    Minutes of Exercise per Session: 30 min  Stress: No Stress Concern Present (10/10/2021)   [employer] of Occupational Health - Occupational Stress Questionnaire    Feeling of Stress : Not at all  Social Connections: Moderately Integrated (10/10/2021)   Social Connection and Isolation Panel [NHANES]    Frequency of Communication with Friends and Family: More than three times a week    Frequency of Social Gatherings with Friends and Family: Twice a week    Attends Religious Services: More than 4 times per year    Active Member of [employer] or Organizations: Yes    Attends Engineer, structural: More than 4 times per year    Marital Status: Divorced   Allergies  Allergen Reactions   Amlodipine     Gingival hyperplasia   Hctz [Hydrochlorothiazide] Other (See Comments)    gout   Jardiance [Empagliflozin] Other (See Comments)    Yeast Infections   Family History  Problem Relation Age of Onset   Diabetes Mother        DM2   Hypertension Mother    Pneumonia Mother        died from this condition   Colon polyps Mother    Heart disease Father        died from this condition   Colon polyps Father    Kidney disease Sister        HBP and DM2; relative died from this condition   Colon polyps Sister    Heart disease Brother        MI - HBP and DM2   Colon polyps Brother    Colon polyps Brother     Current Outpatient Medications (Endocrine & Metabolic):    levothyroxine (SYNTHROID) 75 MCG tablet, Take 75 mcg by mouth daily before breakfast.   metformin (FORTAMET) 500 MG (OSM) 24 hr tablet, Take 500 mg by mouth daily with breakfast.  Current Outpatient Medications (Cardiovascular):    atorvastatin (LIPITOR) 80 MG tablet, TAKE 1 TABLET(80 MG) BY MOUTH DAILY   cloNIDine (CATAPRES - DOSED IN MG/24 HR) 0.3 mg/24hr patch, Place 0.3 mg onto the skin  every Wednesday.   cloNIDine (CATAPRES) 0.2 MG tablet, Take 0.2 mg by mouth 3 (three) times daily.   Evolocumab (REPATHA SURECLICK) 140 MG/ML SOAJ, Inject 140 mg into the skin every 14 (fourteen) days.   irbesartan (AVAPRO) 300 MG tablet, TAKE 1 TABLET(300 MG) BY MOUTH IN THE MORNING   minoxidil (LONITEN) 2.5 MG tablet,  Take 5 mg by mouth in the morning, at noon, and at bedtime.   nebivolol (BYSTOLIC) 5 MG tablet, Take 5 mg by mouth in the morning.   spironolactone (ALDACTONE) 100 MG tablet, Take 100 mg by mouth every evening.   Current Outpatient Medications (Analgesics):    aspirin EC 81 MG tablet, Take 81 mg by mouth every evening. Swallow whole.   aspirin-acetaminophen-caffeine (EXCEDRIN MIGRAINE) 250-250-65 MG tablet, Take 1-2 tablets by mouth every 6 (six) hours as needed for headache (sinus headaches/pain).  Current Outpatient Medications (Hematological):    vitamin B-12 (CYANOCOBALAMIN) 500 MCG tablet, Take 1,000 mcg by mouth every evening.  Current Outpatient Medications (Other):    ALPRAZolam (XANAX) 0.25 MG tablet, Take 1 tablet (0.25 mg total) by mouth 2 (two) times daily as needed for anxiety (prior to dentist).   cholecalciferol (VITAMIN D3) 25 MCG (1000 UT) tablet, Take 1,000 Units by mouth daily.   diazepam (VALIUM) 5 MG tablet, Take 1 tablet an hour prior to MRI and may repeat if needed   fish oil-omega-3 fatty acids 1000 MG capsule, Take 2 g by mouth daily.   gabapentin (NEURONTIN) 100 MG capsule, Take 2 capsules (200 mg total) by mouth at bedtime.   latanoprost (XALATAN) 0.005 % ophthalmic solution, Place 1 drop into both eyes at bedtime.    Multiple Vitamin (MULTIVITAMIN WITH MINERALS) TABS tablet, Take 1 tablet by mouth every evening.   timolol (TIMOPTIC) 0.5 % ophthalmic solution, Place 1 drop into both eyes 2 (two) times daily.   Reviewed prior external information including notes and imaging from  primary care provider As well as notes that were available from  care everywhere and other healthcare systems.  Past medical history, social, surgical and family history all reviewed in electronic medical record.  No pertanent information unless stated regarding to the chief complaint.   Review of Systems:  No headache, visual changes, nausea, vomiting, diarrhea, constipation, dizziness, abdominal pain, skin rash, fevers, chills, night sweats, weight loss, swollen lymph nodes, body aches, joint swelling, chest pain, shortness of breath, mood changes. POSITIVE muscle aches  Objective  Blood pressure (!) 146/84, pulse 68, height 5\' 4"  (1.626 m), weight 194 lb (88 kg), SpO2 96 %.   General: No apparent distress alert and oriented x3 mood and affect normal, dressed appropriately.  HEENT: Pupils equal, extraocular movements intact  Respiratory: Patient's speak in full sentences and does not appear short of breath  Cardiovascular: No lower extremity edema, non tender, no erythema  Antalgic gait noted.  Patient does have severe tenderness to palpation over the medial aspect of the knees bilaterally.  The patient does have crepitus noted with each range of motion and instability with valgus and varus force  After informed written and verbal consent, patient was seated on exam table. Right knee was prepped with alcohol swab and utilizing anterolateral approach, patient's right knee space was injected with 4:1  marcaine 0.5%: Kenalog 40mg /dL. Patient tolerated the procedure well without immediate complications.  After informed written and verbal consent, patient was seated on exam table. Left knee was prepped with alcohol swab and utilizing anterolateral approach, patient's left knee space was injected with 4:1  marcaine 0.5%: Kenalog 40mg /dL. Patient tolerated the procedure well without immediate complications.    Impression and Recommendations:

## 2022-09-12 ENCOUNTER — Ambulatory Visit: Payer: Medicare Other | Admitting: Family Medicine

## 2022-09-12 VITALS — BP 146/84 | HR 68 | Ht 64.0 in | Wt 194.0 lb

## 2022-09-12 DIAGNOSIS — M170 Bilateral primary osteoarthritis of knee: Secondary | ICD-10-CM

## 2022-09-12 NOTE — Assessment & Plan Note (Signed)
Chronic problem with worsening symptoms at this time.  Patient does have some tenderness to palpation.  Does have some instability.  Will continue to monitor.  Patient still wants to avoid any type of surgical intervention.  Discussed icing regimen and home exercises.  Follow-up again in 10 weeks and we can consider other injections if necessary.

## 2022-09-12 NOTE — Patient Instructions (Addendum)
Injections in knees today See you again in 10-12 weeks to get you wedding ready

## 2022-09-24 NOTE — Progress Notes (Unsigned)
Cardiology Office Note:  .   Date:  09/25/2022  ID:  Jade Sullivan, DOB [birth year], MRN [id number] PCP: Jarrett Soho, PA-C  [facility] Providers Cardiologist:  Orbie Pyo, MD { History of Present Illness: .   Jade Sullivan is a 77 y.o. female with past medical history of aortic atherosclerosis, type 2 diabetes mellitus, hypertension associated with diabetes, hyperlipidemia, stage III kidney disease who presents today for follow-up appointment.  Patient was seen December 2022 initially for noted aortic atherosclerosis on noncontrast CT scan done 7 years ago.  At that appointment, patient was doing well however her blood pressure was quite elevated.  Started on atorvastatin 40 mg, Jardiance 10 mg and given a blood pressure of 180/98 referred for blood pressure check in 1 month.  Patient was seen by Dr. Lynnette Caffey 09/24/2021 and at that time blood pressure was checked multiple times by multiple providers and had been 130s over 80s.  She was seen by internal medicine in February and her blood pressure was 130/74.  It was recommended that she continue her current medication regimen.  LDL was 87 and given her history of diabetes and atherosclerosis LDL goal should be less than 70.  Increase atorvastatin to 80 mg and checked a lipid panel at that time.  Also referred to Dr. Rennis Golden for further evaluation.  Today, she tells me that her blood pressure usually runs 120-130 systolic and 85 diastolic.  Her kidney doctor manages her blood pressure medications.  She also takes Repatha for her cholesterol.  No chest pain or shortness of breath.  No dizziness/lightheadedness.  She does not sleep that well at night but also is not using the bathroom frequently at night.  Echocardiogram from 2021 was reviewed.  Patient shares that she was in the hospital with COVID-19 at that time.  Had mild AI and MR.  No recent echo.  Repeat blood pressure was 136/72.  Reports no shortness of breath nor dyspnea on  exertion. Reports no chest pain, pressure, or tightness. No edema, orthopnea, PND. Reports no palpitations.    ROS: Pertinent ROS is located in HPI  Studies Reviewed: Marland Kitchen   EKG Interpretation  Date/Time:  Wednesday September 25 2022 08:18:00 EDT Ventricular Rate:  60 PR Interval:  214 QRS Duration: 80 QT Interval:  408 QTC Calculation: 408 R Axis:   -50 Text Interpretation: Sinus rhythm with 1st degree A-V block Left anterior fascicular block Possible Anterior infarct , age undetermined When compared with ECG of 16-Apr-2019 07:35, Left anterior fascicular block is now Present Confirmed by Jari Favre [phone]) on 09/25/2022 1:03:46 PM   Echocardiogram 05/13/2019 IMPRESSIONS     1. Normal LV systolic function; moderate LVH; grade 1 diastolic  dysfunction; mildly dilated ascending aorta; mild AI and MR; small  pericardial effusion; compared to 04/16/19, pericardial effusion slightly  smaller.   2. Left ventricular ejection fraction, by estimation, is 60 to 65%. The  left ventricle has normal function. The left ventrical has no regional  wall motion abnormalities. There is moderately increased left ventricular  hypertrophy. Left ventricular  diastolic parameters are consistent with Grade I diastolic dysfunction  (impaired relaxation). Elevated left atrial pressure.   3. Right ventricular systolic function is normal. The right ventricular  size is normal. There is mildly elevated pulmonary artery systolic  pressure.   4. The mitral valve is normal in structure and function. Mild mitral  valve regurgitation. No evidence of mitral stenosis.   5. The aortic valve is tricuspid. Aortic  valve regurgitation is mild.  Mild aortic valve sclerosis is present, with no evidence of aortic valve  stenosis.   6. Aortic dilatation noted. There is mild dilatation of the ascending  aorta measuring 41 mm.   7. The inferior vena cava is normal in size with greater than 50%  respiratory variability, suggesting  right atrial pressure of 3 mmHg.   Comparison(s): 04/16/19 EF 55-60%. Moderate pericardial effusion.   FINDINGS   Left Ventricle: Left ventricular ejection fraction, by estimation, is 60  to 65%. The left ventricle has normal function. The left ventricle has no  regional wall motion abnormalities. The left ventricular internal cavity  size was normal in size. There is   moderately increased left ventricular hypertrophy. Left ventricular  diastolic parameters are consistent with Grade I diastolic dysfunction  (impaired relaxation).   Right Ventricle: The right ventricular size is normal. Right ventricular  systolic function is normal. There is mildly elevated pulmonary artery  systolic pressure. The tricuspid regurgitant velocity is 2.76 m/s, and  with an assumed right atrial pressure  of 3 mmHg, the estimated right ventricular systolic pressure is 33.5 mmHg.   Left Atrium: Left atrial size was normal in size.   Right Atrium: Right atrial size was normal in size.   Pericardium: A small pericardial effusion is present.   Mitral Valve: The mitral valve is normal in structure and function. Normal  mobility of the mitral valve leaflets. Mild mitral valve regurgitation. No  evidence of mitral valve stenosis.   Tricuspid Valve: The tricuspid valve is normal in structure. Tricuspid  valve regurgitation is trivial. No evidence of tricuspid stenosis.   Aortic Valve: The aortic valve is tricuspid. Aortic valve regurgitation is  mild. Aortic regurgitation PHT measures 403 msec. Mild aortic valve  sclerosis is present, with no evidence of aortic valve stenosis.   Pulmonic Valve: The pulmonic valve was normal in structure. Pulmonic valve  regurgitation is mild. No evidence of pulmonic stenosis.   Aorta: Aortic dilatation noted. There is mild dilatation of the ascending  aorta measuring 41 mm.   Venous: The inferior vena cava is normal in size with greater than 50%  respiratory  variability, suggesting right atrial pressure of 3 mmHg. The  inferior vena cava and the hepatic vein show a normal flow pattern.   IAS/Shunts: No atrial level shunt detected by color flow Doppler.   Risk Assessment/Calculations:    Physical Exam:   VS:  BP (!) 160/82   Pulse 60   Ht 5\' 4"  (1.626 m)   Wt 193 lb (87.5 kg)   SpO2 97%   BMI 33.13 kg/m    Wt Readings from Last 3 Encounters:  09/25/22 193 lb (87.5 kg)  09/12/22 194 lb (88 kg)  06/20/22 197 lb (89.4 kg)    GEN: Well nourished, well developed in no acute distress NECK: No JVD; No carotid bruits CARDIAC: RRR, + systolic murmurs, rubs, gallops RESPIRATORY:  Clear to auscultation without rales, wheezing or rhonchi  ABDOMEN: Soft, non-tender, non-distended EXTREMITIES:  No edema; No deformity   ASSESSMENT AND PLAN: .   1.  Aortic atherosclerosis/hyperlipidemia -Continue Repatha 140 mg/mL, Lipitor 80 mg daily -Most recent LDL was 77 back in February -Continue current medications  2.  Type 2 diabetes mellitus -she has an upcoming appointment, check A1C every 6 months -Last A1c was 6.8 -Continue management per primary  3.  Hypertension -its running good at home -On recheck 136/72 -Continue clonidine 0.3 mg per 24-hour patch every Wednesday,  clonidine 0.2 mg 3 times a day, Avapro 300 mg daily, Bystolic 5 mg daily, spironolactone 100 mg in the evening -Blood pressure is managed by her nephrologist  4.  Stage III chronic kidney disease -she sees [facility], Dr. Layla Barter  -Most recent creatinine was 1.1 back in October      Dispo: She can follow-up in 6 months with Dr. Lynnette Caffey  Signed, Sharlene Dory, PA-C

## 2022-09-25 ENCOUNTER — Encounter: Payer: Self-pay | Admitting: Physician Assistant

## 2022-09-25 ENCOUNTER — Ambulatory Visit: Payer: Medicare Other | Attending: Physician Assistant | Admitting: Physician Assistant

## 2022-09-25 VITALS — BP 160/82 | HR 60 | Ht 64.0 in | Wt 193.0 lb

## 2022-09-25 DIAGNOSIS — Z7984 Long term (current) use of oral hypoglycemic drugs: Secondary | ICD-10-CM | POA: Diagnosis not present

## 2022-09-25 DIAGNOSIS — N1831 Chronic kidney disease, stage 3a: Secondary | ICD-10-CM

## 2022-09-25 DIAGNOSIS — I152 Hypertension secondary to endocrine disorders: Secondary | ICD-10-CM

## 2022-09-25 DIAGNOSIS — E1159 Type 2 diabetes mellitus with other circulatory complications: Secondary | ICD-10-CM

## 2022-09-25 DIAGNOSIS — E119 Type 2 diabetes mellitus without complications: Secondary | ICD-10-CM | POA: Diagnosis not present

## 2022-09-25 DIAGNOSIS — R011 Cardiac murmur, unspecified: Secondary | ICD-10-CM | POA: Diagnosis not present

## 2022-09-25 DIAGNOSIS — I700 Atherosclerosis of aorta: Secondary | ICD-10-CM | POA: Diagnosis not present

## 2022-09-25 NOTE — Patient Instructions (Addendum)
Medication Instructions:   Your physician recommends that you continue on your current medications as directed. Please refer to the Current Medication list given to you today.   *If you need a refill on your cardiac medications before your next appointment, please call your pharmacy*   Lab Work: NONE ORDERED  TODAY    If you have labs (blood work) drawn today and your tests are completely normal, you will receive your results only by: MyChart Message (if you have MyChart) OR A paper copy in the mail If you have any lab test that is abnormal or we need to change your treatment, we will call you to review the results.   Testing/Procedures: Your physician has requested that you have an echocardiogram. Echocardiography is a painless test that uses sound waves to create images of your heart. It provides your doctor with information about the size and shape of your heart and how well your heart's chambers and valves are working. This procedure takes approximately one hour. There are no restrictions for this procedure. Please do NOT wear cologne, perfume, aftershave, or lotions (deodorant is allowed). Please arrive 15 minutes prior to your appointment time.    Follow-Up: At [facility], you and your health needs are our priority.  As part of our continuing mission to provide you with exceptional heart care, we have created designated Provider Care Teams.  These Care Teams include your primary Cardiologist (physician) and Advanced Practice Providers (APPs -  Physician Assistants and Nurse Practitioners) who all work together to provide you with the care you need, when you need it.  We recommend signing up for the patient portal called "MyChart".  Sign up information is provided on this After Visit Summary.  MyChart is used to connect with patients for Virtual Visits (Telemedicine).  Patients are able to view lab/test results, encounter notes, upcoming appointments, etc.  Non-urgent  messages can be sent to your provider as well.   To learn more about what you can do with MyChart, go to [email or web address].    Your next appointment:   6 month(s)  Provider:   Orbie Pyo, MD     Other Instructions    Low-Sodium Eating Plan Salt (sodium) helps you keep a healthy balance of fluids in your body. Too much sodium can raise your blood pressure. It can also cause fluid and waste to be held in your body. Your health care provider or dietitian may recommend a low-sodium eating plan if you have high blood pressure (hypertension), kidney disease, liver disease, or heart failure. Eating less sodium can help lower your blood pressure and reduce swelling. It can also protect your heart, liver, and kidneys. What are tips for following this plan? Reading food labels  Check food labels for the amount of sodium per serving. If you eat more than one serving, you must multiply the listed amount by the number of servings. Choose foods with less than 140 milligrams (mg) of sodium per serving. Avoid foods with 300 mg of sodium or more per serving. Always check how much sodium is in a product, even if the label says "unsalted" or "no salt added." Shopping  Buy products labeled as "low-sodium" or "no salt added." Buy fresh foods. Avoid canned foods and pre-made or frozen meals. Avoid canned, cured, or processed meats. Buy breads that have less than 80 mg of sodium per slice. Cooking  Eat more home-cooked food. Try to eat less restaurant, buffet, and fast food. Try not to add salt  when you cook. Use salt-free seasonings or herbs instead of table salt or sea salt. Check with your provider or pharmacist before using salt substitutes. Cook with plant-based oils, such as canola, sunflower, or olive oil. Meal planning When eating at a restaurant, ask if your food can be made with less salt or no salt. Avoid dishes labeled as brined, pickled, cured, or smoked. Avoid dishes made with  soy sauce, miso, or teriyaki sauce. Avoid foods that have monosodium glutamate (MSG) in them. MSG may be added to some restaurant food, sauces, soups, bouillon, and canned foods. Make meals that can be grilled, baked, poached, roasted, or steamed. These are often made with less sodium. General information Try to limit your sodium intake to 1,500-2,300 mg each day, or the amount told by your provider. What foods should I eat? Fruits Fresh, frozen, or canned fruit. Fruit juice. Vegetables Fresh or frozen vegetables. "No salt added" canned vegetables. "No salt added" tomato sauce and paste. Low-sodium or reduced-sodium tomato and vegetable juice. Grains Low-sodium cereals, such as oats, puffed wheat and rice, and shredded wheat. Low-sodium crackers. Unsalted rice. Unsalted pasta. Low-sodium bread. Whole grain breads and whole grain pasta. Meats and other proteins Fresh or frozen meat, poultry, seafood, and fish. These should have no added salt. Low-sodium canned tuna and salmon. Unsalted nuts. Dried peas, beans, and lentils without added salt. Unsalted canned beans. Eggs. Unsalted nut butters. Dairy Milk. Soy milk. Cheese that is naturally low in sodium, such as ricotta cheese, fresh mozzarella, or Swiss cheese. Low-sodium or reduced-sodium cheese. Cream cheese. Yogurt. Seasonings and condiments Fresh and dried herbs and spices. Salt-free seasonings. Low-sodium mustard and ketchup. Sodium-free salad dressing. Sodium-free light mayonnaise. Fresh or refrigerated horseradish. Lemon juice. Vinegar. Other foods Homemade, reduced-sodium, or low-sodium soups. Unsalted popcorn and pretzels. Low-salt or salt-free chips. The items listed above may not be all the foods and drinks you can have. Talk to a dietitian to learn more. What foods should I avoid? Vegetables Sauerkraut, pickled vegetables, and relishes. Olives. [place] fries. Onion rings. Regular canned vegetables, except low-sodium or reduced-sodium  items. Regular canned tomato sauce and paste. Regular tomato and vegetable juice. Frozen vegetables in sauces. Grains Instant hot cereals. Bread stuffing, pancake, and biscuit mixes. Croutons. Seasoned rice or pasta mixes. Noodle soup cups. Boxed or frozen macaroni and cheese. Regular salted crackers. Self-rising flour. Meats and other proteins Meat or fish that is salted, canned, smoked, spiced, or pickled. Precooked or cured meat, such as sausages or meat loaves. Tomasa Blase. Ham. Pepperoni. Hot dogs. Corned beef. Chipped beef. Salt pork. Jerky. Pickled herring, anchovies, and sardines. Regular canned tuna. Salted nuts. Dairy Processed cheese and cheese spreads. Hard cheeses. Cheese curds. Blue cheese. Feta cheese. String cheese. Regular cottage cheese. Buttermilk. Canned milk. Fats and oils Salted butter. Regular margarine. Ghee. Bacon fat. Seasonings and condiments Onion salt, garlic salt, seasoned salt, table salt, and sea salt. Canned and packaged gravies. Worcestershire sauce. Tartar sauce. Barbecue sauce. Teriyaki sauce. Soy sauce, including reduced-sodium soy sauce. Steak sauce. Fish sauce. Oyster sauce. Cocktail sauce. Horseradish that you find on the shelf. Regular ketchup and mustard. Meat flavorings and tenderizers. Bouillon cubes. Hot sauce. Pre-made or packaged marinades. Pre-made or packaged taco seasonings. Relishes. Regular salad dressings. Salsa. Other foods Salted popcorn and pretzels. Corn chips and puffs. Potato and tortilla chips. Canned or dried soups. Pizza. Frozen entrees and pot pies. The items listed above may not be all the foods and drinks you should avoid. Talk to a dietitian to  learn more. This information is not intended to replace advice given to you by your health care provider. Make sure you discuss any questions you have with your health care provider. Document Revised: 04/04/2022 Document Reviewed: 04/04/2022 Elsevier Patient Education  2024 [employer]  Heart-Healthy Eating Plan Eating a healthy diet is important for the health of your heart. A heart-healthy eating plan includes: Eating less unhealthy fats. Eating more healthy fats. Eating less salt in your food. Salt is also called sodium. Making other changes in your diet. Talk with your doctor or a diet specialist (dietitian) to create an eating plan that is right for you. What is my plan? Your doctor may recommend an eating plan that includes: Total fat: ______% or less of total calories a day. Saturated fat: ______% or less of total calories a day. Cholesterol: less than _________mg a day. Sodium: less than _________mg a day. What are tips for following this plan? Cooking Avoid frying your food. Try to bake, boil, grill, or broil it instead. You can also reduce fat by: Removing the skin from poultry. Removing all visible fats from meats. Steaming vegetables in water or broth. Meal planning  At meals, divide your plate into four equal parts: Fill one-half of your plate with vegetables and green salads. Fill one-fourth of your plate with whole grains. Fill one-fourth of your plate with lean protein foods. Eat 2-4 cups of vegetables per day. One cup of vegetables is: 1 cup (91 g) broccoli or cauliflower florets. 2 medium carrots. 1 large bell pepper. 1 large sweet potato. 1 large tomato. 1 medium white potato. 2 cups (150 g) raw leafy greens. Eat 1-2 cups of fruit per day. One cup of fruit is: 1 small apple 1 large banana 1 cup (237 g) mixed fruit, 1 large orange,  cup (82 g) dried fruit, 1 cup (240 mL) 100% fruit juice. Eat more foods that have soluble fiber. These are apples, broccoli, carrots, beans, peas, and barley. Try to get 20-30 g of fiber per day. Eat 4-5 servings of nuts, legumes, and seeds per week: 1 serving of dried beans or legumes equals  cup (90 g) cooked. 1 serving of nuts is  oz (12 almonds, 24 pistachios, or 7 walnut halves). 1 serving  of seeds equals  oz (8 g). General information Eat more home-cooked food. Eat less restaurant, buffet, and fast food. Limit or avoid alcohol. Limit foods that are high in starch and sugar. Avoid fried foods. Lose weight if you are overweight. Keep track of how much salt (sodium) you eat. This is important if you have high blood pressure. Ask your doctor to tell you more about this. Try to add vegetarian meals each week. Fats Choose healthy fats. These include olive oil and canola oil, flaxseeds, walnuts, almonds, and seeds. Eat more omega-3 fats. These include salmon, mackerel, sardines, tuna, flaxseed oil, and ground flaxseeds. Try to eat fish at least 2 times each week. Check food labels. Avoid foods with trans fats or high amounts of saturated fat. Limit saturated fats. These are often found in animal products, such as meats, butter, and cream. These are also found in plant foods, such as palm oil, palm kernel oil, and coconut oil. Avoid foods with partially hydrogenated oils in them. These have trans fats. Examples are stick margarine, some tub margarines, cookies, crackers, and other baked goods. What foods should I eat? Fruits All fresh, canned (in natural juice), or frozen fruits. Vegetables Fresh or frozen vegetables (raw, steamed,  roasted, or grilled). Green salads. Grains Most grains. Choose whole wheat and whole grains most of the time. Rice and pasta, including brown rice and pastas made with whole wheat. Meats and other proteins Lean, well-trimmed beef, veal, pork, and lamb. Chicken and [place] without skin. All fish and shellfish. Wild duck, rabbit, pheasant, and venison. Egg whites or low-cholesterol egg substitutes. Dried beans, peas, lentils, and tofu. Seeds and most nuts. Dairy Low-fat or nonfat cheeses, including ricotta and mozzarella. Skim or 1% milk that is liquid, powdered, or evaporated. Buttermilk that is made with low-fat milk. Nonfat or low-fat yogurt. Fats and  oils Non-hydrogenated (trans-free) margarines. Vegetable oils, including soybean, sesame, sunflower, olive, peanut, safflower, corn, canola, and cottonseed. Salad dressings or mayonnaise made with a vegetable oil. Beverages Mineral water. Coffee and tea. Diet carbonated beverages. Sweets and desserts Sherbet, gelatin, and fruit ice. Small amounts of dark chocolate. Limit all sweets and desserts. Seasonings and condiments All seasonings and condiments. The items listed above may not be a complete list of foods and drinks you can eat. Contact a dietitian for more options. What foods should I avoid? Fruits Canned fruit in heavy syrup. Fruit in cream or butter sauce. Fried fruit. Limit coconut. Vegetables Vegetables cooked in cheese, cream, or butter sauce. Fried vegetables. Grains Breads that are made with saturated or trans fats, oils, or whole milk. Croissants. Sweet rolls. Donuts. High-fat crackers, such as cheese crackers. Meats and other proteins Fatty meats, such as hot dogs, ribs, sausage, bacon, rib-eye roast or steak. High-fat deli meats, such as salami and bologna. Caviar. Domestic duck and goose. Organ meats, such as liver. Dairy Cream, sour cream, cream cheese, and creamed cottage cheese. Whole-milk cheeses. Whole or 2% milk that is liquid, evaporated, or condensed. Whole buttermilk. Cream sauce or high-fat cheese sauce. Yogurt that is made from whole milk. Fats and oils Meat fat, or shortening. Cocoa butter, hydrogenated oils, palm oil, coconut oil, palm kernel oil. Solid fats and shortenings, including bacon fat, salt pork, lard, and butter. Nondairy cream substitutes. Salad dressings with cheese or sour cream. Beverages Regular sodas and juice drinks with added sugar. Sweets and desserts Frosting. Pudding. Cookies. Cakes. Pies. Milk chocolate or white chocolate. Buttered syrups. Full-fat ice cream or ice cream drinks. The items listed above may not be a complete list of foods  and drinks to avoid. Contact a dietitian for more information. Summary Heart-healthy meal planning includes eating less unhealthy fats, eating more healthy fats, and making other changes in your diet. Eat a balanced diet. This includes fruits and vegetables, low-fat or nonfat dairy, lean protein, nuts and legumes, whole grains, and heart-healthy oils and fats. This information is not intended to replace advice given to you by your health care provider. Make sure you discuss any questions you have with your health care provider. Document Revised: 04/23/2021 Document Reviewed: 04/23/2021 Elsevier Patient Education  2024 [employer].

## 2022-09-27 ENCOUNTER — Telehealth: Payer: Self-pay | Admitting: *Deleted

## 2022-09-27 ENCOUNTER — Other Ambulatory Visit: Payer: Self-pay | Admitting: *Deleted

## 2022-09-27 DIAGNOSIS — K862 Cyst of pancreas: Secondary | ICD-10-CM

## 2022-09-27 NOTE — Telephone Encounter (Signed)
Inbound call from patient returning call. Please advise. 

## 2022-09-27 NOTE — Telephone Encounter (Signed)
Patient has already been given a schedule for the MRCP procedure and she is requesting something to "calm my nerves when I have the procedure done." Informed the patient the provider will be notified.

## 2022-09-27 NOTE — Telephone Encounter (Signed)
Lorazepam 1 mg PO 1 hr before She needs a ride and should not drive for at least 12 hours after this med [employer]

## 2022-09-27 NOTE — Telephone Encounter (Signed)
Patient called to inform about medication or sedative prior to MRI ABD, that she inquired. Dr. Rhea Belton ordered Lorazepam 1mg  po, 1 hour prior to procedure. [facility] also called to order the medication as prescribed, spoke with Rudell Cobb, RPH.

## 2022-09-27 NOTE — Telephone Encounter (Signed)
-----   Message from Chrystie Nose, RN sent at 09/27/2022  8:56 AM EDT ----- Regarding: FW: MR/MRCP  ----- Message ----- From: Chrystie Nose, RN Sent: 09/27/2022  12:00 AM EDT To: Chrystie Nose, RN Subject: MR/MRCP                                        Pt needs repeat MR/MRCP with gadolinium

## 2022-09-27 NOTE — Telephone Encounter (Signed)
-----   Message from Linda R Hunt, RN sent at 09/27/2022  8:56 AM EDT ----- Regarding: FW: MR/MRCP  ----- Message ----- From: Hunt, Linda R, RN Sent: 09/27/2022  12:00 AM EDT To: Linda R Hunt, RN Subject: MR/MRCP                                        Pt needs repeat MR/MRCP with gadolinium   

## 2022-09-27 NOTE — Telephone Encounter (Signed)
Patient called with no answer, LMTCB. Scheduling for MRCP repeat to be discussed.

## 2022-09-27 NOTE — Telephone Encounter (Signed)
Patient called and messge left to call back; information concerning repeat of MRI ABD for 2024.

## 2022-10-08 DIAGNOSIS — E7841 Elevated Lipoprotein(a): Secondary | ICD-10-CM | POA: Diagnosis not present

## 2022-10-08 DIAGNOSIS — E785 Hyperlipidemia, unspecified: Secondary | ICD-10-CM | POA: Diagnosis not present

## 2022-10-09 LAB — NMR, LIPOPROFILE
Cholesterol, Total: 82 mg/dL — ABNORMAL LOW (ref 100–199)
HDL Particle Number: 30.1 umol/L — ABNORMAL LOW (ref >=30.5)
HDL-C: 42 mg/dL (ref >39)
LDL Particle Number: 458 nmol/L (ref <1000)
LDL Size: 20 nm — ABNORMAL LOW (ref >20.5)
LDL-C (NIH Calc): 26 mg/dL (ref 0–99)
LP-IR Score: 50 — ABNORMAL HIGH (ref <=45)
Small LDL Particle Number: 265 nmol/L (ref <=527)
Triglycerides: 60 mg/dL (ref 0–149)

## 2022-10-09 LAB — LIPOPROTEIN A (LPA): Lipoprotein (a): 55.6 nmol/L (ref <75.0)

## 2022-10-11 ENCOUNTER — Other Ambulatory Visit: Payer: Self-pay | Admitting: Internal Medicine

## 2022-10-11 DIAGNOSIS — K862 Cyst of pancreas: Secondary | ICD-10-CM

## 2022-10-12 ENCOUNTER — Ambulatory Visit (HOSPITAL_COMMUNITY)
Admission: RE | Admit: 2022-10-12 | Discharge: 2022-10-12 | Disposition: A | Discharge status: Home / Self Care | Payer: Medicare Other | Source: Ambulatory Visit | Attending: Internal Medicine | Admitting: Internal Medicine

## 2022-10-12 DIAGNOSIS — N281 Cyst of kidney, acquired: Secondary | ICD-10-CM | POA: Diagnosis not present

## 2022-10-12 DIAGNOSIS — M47816 Spondylosis without myelopathy or radiculopathy, lumbar region: Secondary | ICD-10-CM | POA: Diagnosis not present

## 2022-10-12 DIAGNOSIS — I700 Atherosclerosis of aorta: Secondary | ICD-10-CM | POA: Diagnosis not present

## 2022-10-12 DIAGNOSIS — K862 Cyst of pancreas: Secondary | ICD-10-CM | POA: Insufficient documentation

## 2022-10-12 DIAGNOSIS — K5730 Diverticulosis of large intestine without perforation or abscess without bleeding: Secondary | ICD-10-CM | POA: Diagnosis not present

## 2022-10-12 MED ORDER — GADOPICLENOL 0.5 MMOL/ML IV SOLN
9.0000 mL | Freq: Once | INTRAVENOUS | Status: AC | PRN
Start: 2022-10-12 — End: 2022-10-12
  Administered 2022-10-12: 9 mL via INTRAVENOUS

## 2022-10-15 ENCOUNTER — Encounter (HOSPITAL_BASED_OUTPATIENT_CLINIC_OR_DEPARTMENT_OTHER): Payer: Self-pay | Admitting: Internal Medicine

## 2022-10-15 ENCOUNTER — Ambulatory Visit (HOSPITAL_BASED_OUTPATIENT_CLINIC_OR_DEPARTMENT_OTHER): Payer: Medicare Other | Admitting: Internal Medicine

## 2022-10-15 VITALS — BP 168/84 | HR 69 | Ht 64.0 in | Wt 195.1 lb

## 2022-10-15 DIAGNOSIS — E785 Hyperlipidemia, unspecified: Secondary | ICD-10-CM

## 2022-10-15 DIAGNOSIS — E119 Type 2 diabetes mellitus without complications: Secondary | ICD-10-CM

## 2022-10-15 DIAGNOSIS — E7841 Elevated Lipoprotein(a): Secondary | ICD-10-CM

## 2022-10-15 DIAGNOSIS — I700 Atherosclerosis of aorta: Secondary | ICD-10-CM

## 2022-10-15 NOTE — Patient Instructions (Signed)
Medication Instructions:  NO CHANGES  *If you need a refill on your cardiac medications before your next appointment, please call your pharmacy*   Lab Work: FASTING lab work to check cholesterol in 1 year   If you have labs (blood work) drawn today and your tests are completely normal, you will receive your results only by: MyChart Message (if you have MyChart) OR A paper copy in the mail If you have any lab test that is abnormal or we need to change your treatment, we will call you to review the results.    Follow-Up: At [facility], you and your health needs are our priority.  As part of our continuing mission to provide you with exceptional heart care, we have created designated Provider Care Teams.  These Care Teams include your primary Cardiologist (physician) and Advanced Practice Providers (APPs -  Physician Assistants and Nurse Practitioners) who all work together to provide you with the care you need, when you need it.  We recommend signing up for the patient portal called "MyChart".  Sign up information is provided on this After Visit Summary.  MyChart is used to connect with patients for Virtual Visits (Telemedicine).  Patients are able to view lab/test results, encounter notes, upcoming appointments, etc.  Non-urgent messages can be sent to your provider as well.   To learn more about what you can do with MyChart, go to [email or web address].    Your next appointment:    12 months with Dr. Hilty  

## 2022-10-15 NOTE — Progress Notes (Signed)
LIPID CLINIC CONSULT NOTE  Chief Complaint:  Manage dyslipidemia  Primary Care Physician: Jarrett Soho, PA-C  Primary Cardiologist:  Orbie Pyo, MD  HPI:  Jade Sullivan is a 77 y.o. female who is being seen today for the evaluation of dyslipidemia at the request of Jarrett Soho, [place]. Jade Sullivan is seen today for evaluation management of dyslipidemia.  She has a history of high cholesterol, type 2 diabetes, chronic kidney disease (stage IIIa) and hypertension but no known coronary disease or prior history of heart attack or stroke.  Lab work in August which showed total cholesterol 121, triglycerides 91, HDL 34 and LDL 69 on the background of atorvastatin 80 mg daily therapy.  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  She was referred specifically for evaluation and management of this.  She has been noted to have some aortic atherosclerosis.  10/15/2022  Jade Sullivan is seen today in follow-up.  She has "gotten the hang of" her PCSK9 inhibitor.  That being said her cholesterol numbers are now much lower.  Her lipid particle numbers went from 1016 down to 458, LDL-C now 26 (down from 77) and more importantly her LP(a) is come down from 123 down to 55.6 nmol/L.  She seems to be tolerating the medicine well without any significant side effects.  PMHx:  Past Medical History:  Diagnosis Date   Allergy    seasonal allergies   Arthritis    bilateral knees   Cataract    not a surgical candidate at this time (05/29/2020)   Chronic kidney disease    COVID-19 04/2019   Diabetes mellitus without complication (HCC)    on meds   Diverticulosis of colon (without mention of hemorrhage)    Dysmetabolic syndrome X    Glaucoma    on meds   Gout    H/O hyperthyroidism    By patient h/o hyperthyroidism with protosis, s/p subtotal thyroidectomy. On no replacement therapy. Last thyroid functions 2013 in [place]  Plan Will need thyroid functions Feb '15 with  recommendations to follow.     Heart murmur    hx of   Hyperlipidemia    on meds   Hypertension    on multiple meds   Hypothyroidism    on meds   Kidney lesion    On supplemental oxygen by nasal cannula    at bedtime   Pancreatic cyst    Tubular adenoma of colon     Past Surgical History:  Procedure Laterality Date   BIOPSY  06/27/2020   Procedure: BIOPSY;  Surgeon: Beverley Fiedler, MD;  Location: [facility];  Service: Gastroenterology;;   CESAREAN SECTION  1969   COLONOSCOPY  2016   JMP-MAC-2 day suprep (good)-TICS/TA   COLONOSCOPY WITH PROPOFOL N/A 06/27/2020   Procedure: COLONOSCOPY WITH PROPOFOL;  Surgeon: Beverley Fiedler, MD;  Location: [facility];  Service: Gastroenterology;  Laterality: N/A;   EYE SURGERY     LIPOMA EXCISION Right 11/24/2014   Procedure: EXCISION RIGHT SHOULDER LIPOMA;  Surgeon: Harriette Bouillon, MD;  Location: [facility];  Service: General;  Laterality: Right;   POLYPECTOMY  2016   TA   POLYPECTOMY  06/27/2020   Procedure: POLYPECTOMY;  Surgeon: Beverley Fiedler, MD;  Location: [facility];  Service: Gastroenterology;;   THYROIDECTOMY, PARTIAL  1981   TONSILLECTOMY     UMBILICAL HERNIA REPAIR  2000   WISDOM TOOTH EXTRACTION      FAMHx:  Family History  Problem Relation Age of Onset   Diabetes Mother        DM2   Hypertension Mother    Pneumonia Mother        died from this condition   Colon polyps Mother    Heart disease Father        died from this condition   Colon polyps Father    Kidney disease Sister        HBP and DM2; relative died from this condition   Colon polyps Sister    Heart disease Brother        MI - HBP and DM2   Colon polyps Brother    Colon polyps Brother     SOCHx:   reports that she has quit smoking. She has never used smokeless tobacco. She reports that she does not drink alcohol and does not use drugs.  ALLERGIES:  Allergies  Allergen Reactions   Amlodipine     Gingival hyperplasia   Hctz  [Hydrochlorothiazide] Other (See Comments)    gout   Jardiance [Empagliflozin] Other (See Comments)    Yeast Infections    ROS: Pertinent items noted in HPI and remainder of comprehensive ROS otherwise negative.  HOME MEDS: Current Outpatient Medications on File Prior to Visit  Medication Sig Dispense Refill   ALPRAZolam (XANAX) 0.25 MG tablet Take 1 tablet (0.25 mg total) by mouth 2 (two) times daily as needed for anxiety (prior to dentist). 20 tablet 0   aspirin EC 81 MG tablet Take 81 mg by mouth every evening. Swallow whole.     aspirin-acetaminophen-caffeine (EXCEDRIN MIGRAINE) 250-250-65 MG tablet Take 1-2 tablets by mouth every 6 (six) hours as needed for headache (sinus headaches/pain).     atorvastatin (LIPITOR) 80 MG tablet TAKE 1 TABLET(80 MG) BY MOUTH DAILY 90 tablet 3   cholecalciferol (VITAMIN D3) 25 MCG (1000 UT) tablet Take 1,000 Units by mouth daily.     cloNIDine (CATAPRES - DOSED IN MG/24 HR) 0.3 mg/24hr patch Place 0.3 mg onto the skin every Wednesday.     cloNIDine (CATAPRES) 0.2 MG tablet Take 0.2 mg by mouth 3 (three) times daily.     diazepam (VALIUM) 5 MG tablet Take 1 tablet an hour prior to MRI and may repeat if needed 2 tablet 0   Evolocumab (REPATHA SURECLICK) 140 MG/ML SOAJ Inject 140 mg into the skin every 14 (fourteen) days. 2 mL 11   fish oil-omega-3 fatty acids 1000 MG capsule Take 2 g by mouth daily.     gabapentin (NEURONTIN) 100 MG capsule Take 2 capsules (200 mg total) by mouth at bedtime. 180 capsule 0   irbesartan (AVAPRO) 300 MG tablet TAKE 1 TABLET(300 MG) BY MOUTH IN THE MORNING 90 tablet 0   latanoprost (XALATAN) 0.005 % ophthalmic solution Place 1 drop into both eyes at bedtime.      levothyroxine (SYNTHROID) 75 MCG tablet Take 75 mcg by mouth daily before breakfast.     metformin (FORTAMET) 500 MG (OSM) 24 hr tablet Take 500 mg by mouth daily with breakfast.     minoxidil (LONITEN) 2.5 MG tablet Take 5 mg by mouth in the morning, at noon, and  at bedtime.     Multiple Vitamin (MULTIVITAMIN WITH MINERALS) TABS tablet Take 1 tablet by mouth every evening.     nebivolol (BYSTOLIC) 5 MG tablet Take 5 mg by mouth in the morning.     spironolactone (ALDACTONE) 100 MG tablet Take 100 mg by mouth every evening.  timolol (TIMOPTIC) 0.5 % ophthalmic solution Place 1 drop into both eyes 2 (two) times daily.     vitamin B-12 (CYANOCOBALAMIN) 500 MCG tablet Take 1,000 mcg by mouth every evening.     No current facility-administered medications on file prior to visit.    LABS/IMAGING: No results found for this or any previous visit (from the past 48 hour(s)). No results found.  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   Jade Sullivan has had an excellent response to Repatha in addition to her atorvastatin with a marked reduction in her cholesterol which is now at target with LDL 26 and a reduction in LP(a) from 123 down to 55.6 nmol/L.  She seems to be tolerating it without any significant side effects.  Will plan to continue this combination therapy indefinitely.  Follow-up with me annually or sooner as necessary.  Chrystie Nose, MD, [facility], FACP  [place]  [facility]  Medical Director of the Advanced Lipid Disorders &  Cardiovascular Risk Reduction Clinic Diplomate of the  American Board of Clinical Lipidology Attending Cardiologist  Direct Dial: [phone]  Fax: [phone]  Website:  [email or web address] Nicely Nicolet Griffy 10/15/2022, 4:14 PM

## 2022-10-21 ENCOUNTER — Ambulatory Visit (HOSPITAL_COMMUNITY): Payer: Medicare Other | Attending: Physician Assistant

## 2022-10-21 DIAGNOSIS — R011 Cardiac murmur, unspecified: Secondary | ICD-10-CM

## 2022-10-21 LAB — ECHOCARDIOGRAM COMPLETE
Area-P 1/2: 3.74 cm2
P 1/2 time: 388 msec
S' Lateral: 3.3 cm

## 2022-11-06 ENCOUNTER — Telehealth: Payer: Self-pay | Admitting: Internal Medicine

## 2022-11-06 NOTE — Telephone Encounter (Signed)
Inbound call from patient stating that she got a letter in the mail from [employer] about the testing she had done with Dr. Rhea Belton. Patient stated she is unsure how to go about it and is requesting a call back to discuss. Please advise.

## 2022-11-06 NOTE — Telephone Encounter (Signed)
Please call pt. She states she got a denial from [facility] regarding MR 3D Imaging she had done in July, it was ordered by Dr. Rhea Belton.

## 2022-11-08 ENCOUNTER — Encounter: Payer: Self-pay | Admitting: Podiatry

## 2022-11-08 ENCOUNTER — Ambulatory Visit (INDEPENDENT_AMBULATORY_CARE_PROVIDER_SITE_OTHER): Payer: Medicare Other | Admitting: Podiatry

## 2022-11-08 DIAGNOSIS — M79675 Pain in left toe(s): Secondary | ICD-10-CM

## 2022-11-08 DIAGNOSIS — M79674 Pain in right toe(s): Secondary | ICD-10-CM

## 2022-11-08 DIAGNOSIS — E1122 Type 2 diabetes mellitus with diabetic chronic kidney disease: Secondary | ICD-10-CM

## 2022-11-08 DIAGNOSIS — N1831 Chronic kidney disease, stage 3a: Secondary | ICD-10-CM

## 2022-11-08 DIAGNOSIS — Q828 Other specified congenital malformations of skin: Secondary | ICD-10-CM | POA: Diagnosis not present

## 2022-11-08 DIAGNOSIS — B351 Tinea unguium: Secondary | ICD-10-CM | POA: Diagnosis not present

## 2022-11-08 NOTE — Progress Notes (Signed)
This patient returns to my office for at risk foot care.  This patient requires this care by a professional since this patient will be at risk due to having diabetes and CKD.  This patient is unable to cut nails herself since the patient cannot reach hernails.These nails are painful walking and wearing shoes.  She also has a painful callus left forefoot.This patient presents for at risk foot care today.  General Appearance  Alert, conversant and in no acute stress.  Vascular  Dorsalis pedis and posterior tibial  pulses are palpable  bilaterally.  Capillary return is within normal limits  bilaterally. Temperature is within normal limits  bilaterally.  Neurologic  Senn-Weinstein monofilament wire test within normal limits  bilaterally. Muscle power within normal limits bilaterally.  Nails Thick disfigured discolored nails with subungual debris  from hallux to fifth toes bilaterally. No evidence of bacterial infection or drainage bilaterally.  Orthopedic  No limitations of motion  feet .  No crepitus or effusions noted.  No bony pathology or digital deformities noted.  Skin  normotropic skin noted bilaterally.  No signs of infections or ulcers noted.   Porokeratosis sub 3 left foot   Onychomycosis  Pain in right toes  Pain in left toes  Consent was obtained for treatment procedures.   Mechanical debridement of nails 1-5  bilaterally performed with a nail nipper.  Filed with dremel without incident.  Debride porokeratosis with # 15 blade.   Return office visit   3 months                   Told patient to return for periodic foot care and evaluation due to potential at risk complications.   Helane Gunther DPM

## 2022-11-12 ENCOUNTER — Telehealth: Payer: Self-pay | Admitting: Pharmacy Technician

## 2022-11-12 ENCOUNTER — Other Ambulatory Visit (HOSPITAL_COMMUNITY): Payer: Self-pay

## 2022-11-12 NOTE — Telephone Encounter (Signed)
Pharmacy Patient Advocate Encounter   Received notification from CoverMyMeds that prior authorization for Repatha SureClick 140MG /ML auto-injectors is required/requested.   Insurance verification completed.   The patient is insured through [facility] .   Per test claim: Refill too soon. PA is not needed at this time. Medication was filled .Marland Kitchen Next eligible fill date is 11/14/2022.

## 2022-11-13 DIAGNOSIS — E890 Postprocedural hypothyroidism: Secondary | ICD-10-CM | POA: Diagnosis not present

## 2022-11-13 DIAGNOSIS — I700 Atherosclerosis of aorta: Secondary | ICD-10-CM | POA: Diagnosis not present

## 2022-11-13 DIAGNOSIS — E1122 Type 2 diabetes mellitus with diabetic chronic kidney disease: Secondary | ICD-10-CM | POA: Diagnosis not present

## 2022-11-13 DIAGNOSIS — N183 Chronic kidney disease, stage 3 unspecified: Secondary | ICD-10-CM | POA: Diagnosis not present

## 2022-11-13 DIAGNOSIS — I10 Essential (primary) hypertension: Secondary | ICD-10-CM | POA: Diagnosis not present

## 2022-11-13 DIAGNOSIS — H401131 Primary open-angle glaucoma, bilateral, mild stage: Secondary | ICD-10-CM | POA: Diagnosis not present

## 2022-11-14 NOTE — Progress Notes (Signed)
[name] [address] [place] [address] Phone: [phone] Subjective:   [name], am serving as a scribe for Dr. Antoine Primas.  I'm seeing this patient by the request  of:  Jarrett Soho, PA-C  CC: Arthritis in the knees bilaterally  [id number]  09/12/2022 Chronic problem with worsening symptoms at this time.  Patient does have some tenderness to palpation.  Does have some instability.  Will continue to monitor.  Patient still wants to avoid any type of surgical intervention.  Discussed icing regimen and home exercises.  Follow-up again in 10 weeks and we can consider other injections if necessary.      Update 11/19/2022 Jade Sullivan is a 77 y.o. female coming in with complaint of DJD of B knee. Patient states doing well. Here for injections. Would like a refill on gabapentin.  Patient feels that the gabapentin is significantly helpful for pain at night and even some during the day.      Past Medical History:  Diagnosis Date   Allergy    seasonal allergies   Arthritis    bilateral knees   Cataract    not a surgical candidate at this time (05/29/2020)   Chronic kidney disease    COVID-19 04/2019   Diabetes mellitus without complication (HCC)    on meds   Diverticulosis of colon (without mention of hemorrhage)    Dysmetabolic syndrome X    Glaucoma    on meds   Gout    H/O hyperthyroidism    By patient h/o hyperthyroidism with protosis, s/p subtotal thyroidectomy. On no replacement therapy. Last thyroid functions 2013 in [place]  Plan Will need thyroid functions Feb '15 with recommendations to follow.     Heart murmur    hx of   Hyperlipidemia    on meds   Hypertension    on multiple meds   Hypothyroidism    on meds   Kidney lesion    On supplemental oxygen by nasal cannula    at bedtime   Pancreatic cyst    Tubular adenoma of colon    Past Surgical History:  Procedure Laterality Date   BIOPSY   06/27/2020   Procedure: BIOPSY;  Surgeon: Beverley Fiedler, MD;  Location: [facility];  Service: Gastroenterology;;   CESAREAN SECTION  1969   COLONOSCOPY  2016   JMP-MAC-2 day suprep (good)-TICS/TA   COLONOSCOPY WITH PROPOFOL N/A 06/27/2020   Procedure: COLONOSCOPY WITH PROPOFOL;  Surgeon: Beverley Fiedler, MD;  Location: [facility];  Service: Gastroenterology;  Laterality: N/A;   EYE SURGERY     LIPOMA EXCISION Right 11/24/2014   Procedure: EXCISION RIGHT SHOULDER LIPOMA;  Surgeon: Harriette Bouillon, MD;  Location: [facility];  Service: General;  Laterality: Right;   POLYPECTOMY  2016   TA   POLYPECTOMY  06/27/2020   Procedure: POLYPECTOMY;  Surgeon: Beverley Fiedler, MD;  Location: [facility];  Service: Gastroenterology;;   THYROIDECTOMY, PARTIAL  1981   TONSILLECTOMY     UMBILICAL HERNIA REPAIR  2000   WISDOM TOOTH EXTRACTION     Social History   Socioeconomic History   Marital status: Divorced    Spouse name: Not on file   Number of children: 1   Years of education: Not on file   Highest education level: Not on file  Occupational History   Occupation: Retired  Tobacco Use   Smoking status: Former   Smokeless tobacco: Never  Advertising account planner  Vaping status: Never Used  Substance and Sexual Activity   Alcohol use: No    Alcohol/week: 0.0 standard drinks of alcohol   Drug use: No   Sexual activity: Not Currently    Partners: Male    Birth control/protection: Post-menopausal    Comment: 1st intercourse- 59, partners- 5, divorced  Other Topics Concern   Not on file  Social History Narrative   Divorced   Retired - Engineer, petroleum   Social Determinants of Health   Financial Resource Strain: Low Risk  (10/10/2021)   Overall Financial Resource Strain (CARDIA)    Difficulty of Paying Living Expenses: Not hard at all  Food Insecurity: No Food Insecurity (12/20/2021)   Hunger Vital Sign    Worried About Running Out of Food in the Last Year: Never true    Ran Out of  Food in the Last Year: Never true  Transportation Needs: No Transportation Needs (12/20/2021)   PRAPARE - Administrator, Civil Service (Medical): No    Lack of Transportation (Non-Medical): No  Physical Activity: Sufficiently Active (10/10/2021)   Exercise Vital Sign    Days of Exercise per Week: 5 days    Minutes of Exercise per Session: 30 min  Stress: No Stress Concern Present (10/10/2021)   [employer] of Occupational Health - Occupational Stress Questionnaire    Feeling of Stress : Not at all  Social Connections: Moderately Integrated (10/10/2021)   Social Connection and Isolation Panel [NHANES]    Frequency of Communication with Friends and Family: More than three times a week    Frequency of Social Gatherings with Friends and Family: Twice a week    Attends Religious Services: More than 4 times per year    Active Member of [employer] or Organizations: Yes    Attends Engineer, structural: More than 4 times per year    Marital Status: Divorced   Allergies  Allergen Reactions   Amlodipine     Gingival hyperplasia   Hctz [Hydrochlorothiazide] Other (See Comments)    gout   Jardiance [Empagliflozin] Other (See Comments)    Yeast Infections   Family History  Problem Relation Age of Onset   Diabetes Mother        DM2   Hypertension Mother    Pneumonia Mother        died from this condition   Colon polyps Mother    Heart disease Father        died from this condition   Colon polyps Father    Kidney disease Sister        HBP and DM2; relative died from this condition   Colon polyps Sister    Heart disease Brother        MI - HBP and DM2   Colon polyps Brother    Colon polyps Brother     Current Outpatient Medications (Endocrine & Metabolic):    levothyroxine (SYNTHROID) 75 MCG tablet, Take 75 mcg by mouth daily before breakfast.   metformin (FORTAMET) 500 MG (OSM) 24 hr tablet, Take 500 mg by mouth daily with breakfast.  Current Outpatient  Medications (Cardiovascular):    atorvastatin (LIPITOR) 80 MG tablet, TAKE 1 TABLET(80 MG) BY MOUTH DAILY   cloNIDine (CATAPRES - DOSED IN MG/24 HR) 0.3 mg/24hr patch, Place 0.3 mg onto the skin every Wednesday.   cloNIDine (CATAPRES) 0.2 MG tablet, Take 0.2 mg by mouth 3 (three) times daily.   Evolocumab (REPATHA SURECLICK) 140 MG/ML SOAJ, Inject 140 mg  into the skin every 14 (fourteen) days.   irbesartan (AVAPRO) 300 MG tablet, TAKE 1 TABLET(300 MG) BY MOUTH IN THE MORNING   minoxidil (LONITEN) 2.5 MG tablet, Take 5 mg by mouth in the morning, at noon, and at bedtime.   nebivolol (BYSTOLIC) 5 MG tablet, Take 5 mg by mouth in the morning.   spironolactone (ALDACTONE) 100 MG tablet, Take 100 mg by mouth every evening.   Current Outpatient Medications (Analgesics):    aspirin EC 81 MG tablet, Take 81 mg by mouth every evening. Swallow whole.   aspirin-acetaminophen-caffeine (EXCEDRIN MIGRAINE) 250-250-65 MG tablet, Take 1-2 tablets by mouth every 6 (six) hours as needed for headache (sinus headaches/pain).  Current Outpatient Medications (Hematological):    vitamin B-12 (CYANOCOBALAMIN) 500 MCG tablet, Take 1,000 mcg by mouth every evening.  Current Outpatient Medications (Other):    ALPRAZolam (XANAX) 0.25 MG tablet, Take 1 tablet (0.25 mg total) by mouth 2 (two) times daily as needed for anxiety (prior to dentist).   cholecalciferol (VITAMIN D3) 25 MCG (1000 UT) tablet, Take 1,000 Units by mouth daily.   diazepam (VALIUM) 5 MG tablet, Take 1 tablet an hour prior to MRI and may repeat if needed   fish oil-omega-3 fatty acids 1000 MG capsule, Take 2 g by mouth daily.   gabapentin (NEURONTIN) 100 MG capsule, Take 2 capsules (200 mg total) by mouth at bedtime.   latanoprost (XALATAN) 0.005 % ophthalmic solution, Place 1 drop into both eyes at bedtime.    Multiple Vitamin (MULTIVITAMIN WITH MINERALS) TABS tablet, Take 1 tablet by mouth every evening.   timolol (TIMOPTIC) 0.5 % ophthalmic  solution, Place 1 drop into both eyes 2 (two) times daily.   Reviewed prior external information including notes and imaging from  primary care provider As well as notes that were available from care everywhere and other healthcare systems.  Past medical history, social, surgical and family history all reviewed in electronic medical record.  No pertanent information unless stated regarding to the chief complaint.   Review of Systems:  No headache, visual changes, nausea, vomiting, diarrhea, constipation, dizziness, abdominal pain, skin rash, fevers, chills, night sweats, weight loss, swollen lymph nodes, body aches, joint swelling, chest pain, shortness of breath, mood changes. POSITIVE muscle aches  Objective  Blood pressure 126/82, pulse 71, height 5\' 4"  (1.626 m), weight 195 lb (88.5 kg), SpO2 96%.   General: No apparent distress alert and oriented x3 mood and affect normal, dressed appropriately.  HEENT: Pupils equal, extraocular movements intact  Respiratory: Patient's speak in full sentences and does not appear short of breath  Cardiovascular: No lower extremity edema, non tender, no erythema  Mild antalgic gait noted.  Right knee does have some trace effusion noted.  Mild crepitus noted.   After informed written and verbal consent, patient was seated on exam table. Right knee was prepped with alcohol swab and utilizing anterolateral approach, patient's right knee space was injected with 4:1  marcaine 0.5%: Kenalog 40mg /dL. Patient tolerated the procedure well without immediate complications.  After informed written and verbal consent, patient was seated on exam table. Left knee was prepped with alcohol swab and utilizing anterolateral approach, patient's left knee space was injected with 4:1  marcaine 0.5%: Kenalog 40mg /dL. Patient tolerated the procedure well without immediate complications.   Impression and Recommendations:    The above documentation has been reviewed and is  accurate and complete Judi Saa, DO

## 2022-11-19 ENCOUNTER — Encounter: Payer: Self-pay | Admitting: Family Medicine

## 2022-11-19 ENCOUNTER — Ambulatory Visit: Payer: Medicare Other | Admitting: Family Medicine

## 2022-11-19 VITALS — BP 126/82 | HR 71 | Ht 64.0 in | Wt 195.0 lb

## 2022-11-19 DIAGNOSIS — M170 Bilateral primary osteoarthritis of knee: Secondary | ICD-10-CM | POA: Diagnosis not present

## 2022-11-19 MED ORDER — GABAPENTIN 100 MG PO CAPS: 200.0000 mg | ORAL_CAPSULE | Freq: Every day | ORAL | 0 refills | Status: DC

## 2022-11-19 NOTE — Patient Instructions (Addendum)
Injection in knees today Refilled gabapentin See you again in 10-12 weeks Have fun at the wedding

## 2022-11-19 NOTE — Assessment & Plan Note (Signed)
Call pulm with worsening symptoms.  Patient is continuing to try to be active when possible.  Has been having injections for over 7 years at this time.  Still wants to avoid any type of surgical intervention if possible.  Hopefully this will continue to help and we can review and further evaluate patient again in 10 to 12 weeks

## 2022-11-20 NOTE — Telephone Encounter (Signed)
[employer] is calling to have someone get an update on denial of MR 3D imaging. She is upset that no one has even called to give an update. Please advise.

## 2022-12-16 ENCOUNTER — Other Ambulatory Visit: Payer: Self-pay | Admitting: Family Medicine

## 2022-12-16 DIAGNOSIS — Z1231 Encounter for screening mammogram for malignant neoplasm of breast: Secondary | ICD-10-CM

## 2022-12-23 ENCOUNTER — Telehealth: Payer: Self-pay | Admitting: Pharmacy Technician

## 2022-12-23 ENCOUNTER — Other Ambulatory Visit (HOSPITAL_COMMUNITY): Payer: Self-pay

## 2022-12-23 NOTE — Telephone Encounter (Signed)
Pharmacy Patient Advocate Encounter  Received notification from [facility] that Prior Authorization for repatha has been APPROVED from 12/23/22 to 04/01/23. Ran test claim, Copay is $11.20. This test claim was processed through [facility]- copay amounts may vary at other pharmacies due to pharmacy/plan contracts, or as the patient moves through the different stages of their insurance plan.   PA #/Case ID/Reference #: [id number]

## 2022-12-23 NOTE — Telephone Encounter (Signed)
Pharmacy Patient Advocate Encounter   Received notification from CoverMyMeds that prior authorization for repatha is required/requested.   Insurance verification completed.   The patient is insured through [facility] .   Per test claim: PA required; PA submitted to [facility] via CoverMyMeds Key/confirmation #/EOC [facility] Status is pending

## 2023-01-06 DIAGNOSIS — H401131 Primary open-angle glaucoma, bilateral, mild stage: Secondary | ICD-10-CM | POA: Diagnosis not present

## 2023-01-20 ENCOUNTER — Ambulatory Visit
Admission: RE | Admit: 2023-01-20 | Discharge: 2023-01-20 | Disposition: A | Discharge status: Home / Self Care | Payer: Medicare Other | Source: Ambulatory Visit | Attending: Family Medicine | Admitting: Family Medicine

## 2023-01-20 DIAGNOSIS — Z1231 Encounter for screening mammogram for malignant neoplasm of breast: Secondary | ICD-10-CM | POA: Diagnosis not present

## 2023-01-28 ENCOUNTER — Ambulatory Visit: Payer: Medicare Other | Admitting: Family Medicine

## 2023-01-28 DIAGNOSIS — I129 Hypertensive chronic kidney disease with stage 1 through stage 4 chronic kidney disease, or unspecified chronic kidney disease: Secondary | ICD-10-CM | POA: Diagnosis not present

## 2023-01-28 DIAGNOSIS — E782 Mixed hyperlipidemia: Secondary | ICD-10-CM | POA: Diagnosis not present

## 2023-01-28 DIAGNOSIS — E559 Vitamin D deficiency, unspecified: Secondary | ICD-10-CM | POA: Diagnosis not present

## 2023-01-28 DIAGNOSIS — N182 Chronic kidney disease, stage 2 (mild): Secondary | ICD-10-CM | POA: Diagnosis not present

## 2023-01-29 ENCOUNTER — Ambulatory Visit (INDEPENDENT_AMBULATORY_CARE_PROVIDER_SITE_OTHER): Payer: Medicare Other | Admitting: Obstetrics and Gynecology

## 2023-01-29 ENCOUNTER — Encounter: Payer: Self-pay | Admitting: Obstetrics and Gynecology

## 2023-01-29 VITALS — BP 132/80 | HR 84 | Ht 65.0 in | Wt 193.0 lb

## 2023-01-29 DIAGNOSIS — N950 Postmenopausal bleeding: Secondary | ICD-10-CM | POA: Insufficient documentation

## 2023-01-29 DIAGNOSIS — B9689 Other specified bacterial agents as the cause of diseases classified elsewhere: Secondary | ICD-10-CM

## 2023-01-29 DIAGNOSIS — N760 Acute vaginitis: Secondary | ICD-10-CM | POA: Diagnosis not present

## 2023-01-29 DIAGNOSIS — N898 Other specified noninflammatory disorders of vagina: Secondary | ICD-10-CM

## 2023-01-29 LAB — WET PREP FOR TRICH, YEAST, CLUE

## 2023-01-29 MED ORDER — METRONIDAZOLE 500 MG PO TABS: 500.0000 mg | ORAL_TABLET | Freq: Two times a day (BID) | ORAL | 0 refills | Status: AC

## 2023-01-29 NOTE — Progress Notes (Signed)
77 y.o. [id number] female here for vaginitis symptoms. Hx notable for DM. Divorced.  No LMP recorded. Patient is postmenopausal.   Reports light pink discharge on panty liner over past week. No itching or irritation. Sexually active: no   GYN HISTORY: No significant history  OB History  Gravida Para Term Preterm AB Living  1 1 1     1   SAB IAB Ectopic Multiple Live Births               # Outcome Date GA Lbr Len/2nd Weight Sex Type Anes PTL Lv  1 Term             Past Medical History:  Diagnosis Date   Allergy    seasonal allergies   Arthritis    bilateral knees   Cataract    not a surgical candidate at this time (05/29/2020)   Chronic kidney disease    COVID-19 04/2019   Diabetes mellitus without complication (HCC)    on meds   Diverticulosis of colon (without mention of hemorrhage)    Dysmetabolic syndrome X    Glaucoma    on meds   Gout    H/O hyperthyroidism    By patient h/o hyperthyroidism with protosis, s/p subtotal thyroidectomy. On no replacement therapy. Last thyroid functions 2013 in [place]  Plan Will need thyroid functions Feb '15 with recommendations to follow.     Heart murmur    hx of   Hyperlipidemia    on meds   Hypertension    on multiple meds   Hypothyroidism    on meds   Kidney lesion    On supplemental oxygen by nasal cannula    at bedtime   Pancreatic cyst    Tubular adenoma of colon     Past Surgical History:  Procedure Laterality Date   BIOPSY  06/27/2020   Procedure: BIOPSY;  Surgeon: Beverley Fiedler, MD;  Location: [facility];  Service: Gastroenterology;;   CESAREAN SECTION  1969   COLONOSCOPY  2016   JMP-MAC-2 day suprep (good)-TICS/TA   COLONOSCOPY WITH PROPOFOL N/A 06/27/2020   Procedure: COLONOSCOPY WITH PROPOFOL;  Surgeon: Beverley Fiedler, MD;  Location: [facility];  Service: Gastroenterology;  Laterality: N/A;   EYE SURGERY     LIPOMA EXCISION Right 11/24/2014   Procedure: EXCISION RIGHT SHOULDER LIPOMA;  Surgeon: Harriette Bouillon, MD;  Location: [facility];  Service: General;  Laterality: Right;   POLYPECTOMY  2016   TA   POLYPECTOMY  06/27/2020   Procedure: POLYPECTOMY;  Surgeon: Beverley Fiedler, MD;  Location: [facility];  Service: Gastroenterology;;   THYROIDECTOMY, PARTIAL  1981   TONSILLECTOMY     UMBILICAL HERNIA REPAIR  2000   WISDOM TOOTH EXTRACTION      Current Outpatient Medications on File Prior to Visit  Medication Sig Dispense Refill   ALPRAZolam (XANAX) 0.25 MG tablet Take 1 tablet (0.25 mg total) by mouth 2 (two) times daily as needed for anxiety (prior to dentist). 20 tablet 0   aspirin EC 81 MG tablet Take 81 mg by mouth every evening. Swallow whole.     aspirin-acetaminophen-caffeine (EXCEDRIN MIGRAINE) 250-250-65 MG tablet Take 1-2 tablets by mouth every 6 (six) hours as needed for headache (sinus headaches/pain).     cholecalciferol (VITAMIN D3) 25 MCG (1000 UT) tablet Take 1,000 Units by mouth daily.     cloNIDine (CATAPRES - DOSED IN MG/24 HR) 0.3 mg/24hr patch Place 0.3 mg onto the skin every  Wednesday.     cloNIDine (CATAPRES) 0.2 MG tablet Take 0.2 mg by mouth 3 (three) times daily.     diazepam (VALIUM) 5 MG tablet Take 1 tablet an hour prior to MRI and may repeat if needed 2 tablet 0   Evolocumab (REPATHA SURECLICK) 140 MG/ML SOAJ Inject 140 mg into the skin every 14 (fourteen) days. 2 mL 11   fish oil-omega-3 fatty acids 1000 MG capsule Take 2 g by mouth daily.     gabapentin (NEURONTIN) 100 MG capsule Take 2 capsules (200 mg total) by mouth at bedtime. 180 capsule 0   irbesartan (AVAPRO) 300 MG tablet TAKE 1 TABLET(300 MG) BY MOUTH IN THE MORNING 90 tablet 0   latanoprost (XALATAN) 0.005 % ophthalmic solution Place 1 drop into both eyes at bedtime.      levothyroxine (SYNTHROID) 75 MCG tablet Take 75 mcg by mouth daily before breakfast.     metformin (FORTAMET) 500 MG (OSM) 24 hr tablet Take 500 mg by mouth daily with breakfast.     minoxidil (LONITEN) 2.5 MG  tablet Take 5 mg by mouth in the morning, at noon, and at bedtime.     Multiple Vitamin (MULTIVITAMIN WITH MINERALS) TABS tablet Take 1 tablet by mouth every evening.     nebivolol (BYSTOLIC) 5 MG tablet Take 5 mg by mouth in the morning.     spironolactone (ALDACTONE) 100 MG tablet Take 100 mg by mouth every evening.     timolol (TIMOPTIC) 0.5 % ophthalmic solution Place 1 drop into both eyes 2 (two) times daily.     vitamin B-12 (CYANOCOBALAMIN) 500 MCG tablet Take 1,000 mcg by mouth every evening.     atorvastatin (LIPITOR) 80 MG tablet TAKE 1 TABLET(80 MG) BY MOUTH DAILY 90 tablet 3   No current facility-administered medications on file prior to visit.    Allergies  Allergen Reactions   Amlodipine     Gingival hyperplasia   Hctz [Hydrochlorothiazide] Other (See Comments)    gout   Jardiance [Empagliflozin] Other (See Comments)    Yeast Infections      PE Today's Vitals   01/29/23 1028  BP: 132/80  Pulse: 84  SpO2: 100%  Weight: 193 lb (87.5 kg)  Height: 5\' 5"  (1.651 m)   Body mass index is 32.12 kg/m.  Physical Exam Vitals reviewed. Exam conducted with a chaperone present.  Constitutional:      General: She is not in acute distress.    Appearance: Normal appearance.  HENT:     Head: Normocephalic and atraumatic.     Nose: Nose normal.  Eyes:     Extraocular Movements: Extraocular movements intact.     Conjunctiva/sclera: Conjunctivae normal.  Pulmonary:     Effort: Pulmonary effort is normal.  Genitourinary:    General: Normal vulva.     Exam position: Lithotomy position.     Vagina: Vaginal discharge present. No bleeding.     Cervix: Normal. No cervical motion tenderness, discharge or lesion.     Uterus: Normal. Not enlarged and not tender.      Adnexa: Right adnexa normal and left adnexa normal.  Musculoskeletal:        General: Normal range of motion.     Cervical back: Normal range of motion.  Neurological:     General: No focal deficit present.      Mental Status: She is alert.  Psychiatric:        Mood and Affect: Mood normal.  Behavior: Behavior normal.       Assessment and Plan:        Vaginal discharge -     WET PREP FOR TRICH, YEAST, CLUE  PMB (postmenopausal bleeding) Assessment & Plan: Reviewed causes of PMB, including genitourinary syndrome of menopause, infection, trauma, polyps, urinary and GI etiologies, and malignancy. Recommend endometrial assessment with transvaginal ultrasound. Patient to RTO ion 2 wk.   Orders: -     US PELVIS TRANSVAGINAL NON-OB (TV ONLY); Future  BV (bacterial vaginosis) -     metroNIDAZOLE; Take 1 tablet (500 mg total) by mouth 2 (two) times daily for 7 days.  Dispense: 14 tablet; Refill: 0     Rosalyn Gess, MD

## 2023-01-29 NOTE — Progress Notes (Unsigned)
[facility] [address] [place] [address] Phone: [phone] Subjective:   [facility], am serving as a scribe for Dr. Antoine Primas.  I'm seeing this patient by the request  of:  Jarrett Soho, PA-C  CC: Bilateral knee pain follow-up  [id number]  11/19/2022 Call pulm with worsening symptoms. Patient is continuing to try to be active when possible. Has been having injections for over 7 years at this time. Still wants to avoid any type of surgical intervention if possible. Hopefully this will continue to help and we can review and further evaluate patient again in 10 to 12 weeks   Updated 01/30/2023 Jade [facility] is a 77 y.o. female coming in with complaint of B knee pain. Patient states she is doing well. Would like injections.       Past Medical History:  Diagnosis Date   Allergy    seasonal allergies   Arthritis    bilateral knees   Cataract    not a surgical candidate at this time (05/29/2020)   Chronic kidney disease    COVID-19 04/2019   Diabetes mellitus without complication (HCC)    on meds   Diverticulosis of colon (without mention of hemorrhage)    Dysmetabolic syndrome X    Glaucoma    on meds   Gout    H/O hyperthyroidism    By patient h/o hyperthyroidism with protosis, s/p subtotal thyroidectomy. On no replacement therapy. Last thyroid functions 2013 in [place]  Plan Will need thyroid functions Feb '15 with recommendations to follow.     Heart murmur    hx of   Hyperlipidemia    on meds   Hypertension    on multiple meds   Hypothyroidism    on meds   Kidney lesion    On supplemental oxygen by nasal cannula    at bedtime   Pancreatic cyst    Tubular adenoma of colon    Past Surgical History:  Procedure Laterality Date   BIOPSY  06/27/2020   Procedure: BIOPSY;  Surgeon: Beverley Fiedler, MD;  Location: [facility];  Service: Gastroenterology;;   CESAREAN SECTION  1969   COLONOSCOPY  2016    JMP-MAC-2 day suprep (good)-TICS/TA   COLONOSCOPY WITH PROPOFOL N/A 06/27/2020   Procedure: COLONOSCOPY WITH PROPOFOL;  Surgeon: Beverley Fiedler, MD;  Location: [facility];  Service: Gastroenterology;  Laterality: N/A;   EYE SURGERY     LIPOMA EXCISION Right 11/24/2014   Procedure: EXCISION RIGHT SHOULDER LIPOMA;  Surgeon: Harriette Bouillon, MD;  Location: [facility];  Service: General;  Laterality: Right;   POLYPECTOMY  2016   TA   POLYPECTOMY  06/27/2020   Procedure: POLYPECTOMY;  Surgeon: Beverley Fiedler, MD;  Location: [facility];  Service: Gastroenterology;;   THYROIDECTOMY, PARTIAL  1981   TONSILLECTOMY     UMBILICAL HERNIA REPAIR  2000   WISDOM TOOTH EXTRACTION     Social History   Socioeconomic History   Marital status: Divorced    Spouse name: Not on file   Number of children: 1   Years of education: Not on file   Highest education level: Not on file  Occupational History   Occupation: Retired  Tobacco Use   Smoking status: Former   Smokeless tobacco: Never  Vaping Use   Vaping status: Never Used  Substance and Sexual Activity   Alcohol use: No    Alcohol/week: 0.0 standard drinks of alcohol   Drug use:  No   Sexual activity: Not Currently    Partners: Male    Birth control/protection: Post-menopausal    Comment: 1st intercourse- 65, partners- 5, divorced  Other Topics Concern   Not on file  Social History Narrative   Divorced   Retired - Engineer, petroleum   Social Determinants of Health   Financial Resource Strain: Low Risk  (10/10/2021)   Overall Financial Resource Strain (CARDIA)    Difficulty of Paying Living Expenses: Not hard at all  Food Insecurity: No Food Insecurity (12/20/2021)   Hunger Vital Sign    Worried About Running Out of Food in the Last Year: Never true    Ran Out of Food in the Last Year: Never true  Transportation Needs: No Transportation Needs (12/20/2021)   PRAPARE - Administrator, Civil Service (Medical): No     Lack of Transportation (Non-Medical): No  Physical Activity: Sufficiently Active (10/10/2021)   Exercise Vital Sign    Days of Exercise per Week: 5 days    Minutes of Exercise per Session: 30 min  Stress: No Stress Concern Present (10/10/2021)   [employer] of Occupational Health - Occupational Stress Questionnaire    Feeling of Stress : Not at all  Social Connections: Moderately Integrated (10/10/2021)   Social Connection and Isolation Panel [NHANES]    Frequency of Communication with Friends and Family: More than three times a week    Frequency of Social Gatherings with Friends and Family: Twice a week    Attends Religious Services: More than 4 times per year    Active Member of [employer] or Organizations: Yes    Attends Engineer, structural: More than 4 times per year    Marital Status: Divorced   Allergies  Allergen Reactions   Amlodipine     Gingival hyperplasia   Hctz [Hydrochlorothiazide] Other (See Comments)    gout   Jardiance [Empagliflozin] Other (See Comments)    Yeast Infections   Family History  Problem Relation Age of Onset   Diabetes Mother        DM2   Hypertension Mother    Pneumonia Mother        died from this condition   Colon polyps Mother    Heart disease Father        died from this condition   Colon polyps Father    Kidney disease Sister        HBP and DM2; relative died from this condition   Colon polyps Sister    Heart disease Brother        MI - HBP and DM2   Colon polyps Brother    Colon polyps Brother     Current Outpatient Medications (Endocrine & Metabolic):    levothyroxine (SYNTHROID) 75 MCG tablet, Take 75 mcg by mouth daily before breakfast.   metformin (FORTAMET) 500 MG (OSM) 24 hr tablet, Take 500 mg by mouth daily with breakfast.  Current Outpatient Medications (Cardiovascular):    atorvastatin (LIPITOR) 80 MG tablet, TAKE 1 TABLET(80 MG) BY MOUTH DAILY   cloNIDine (CATAPRES - DOSED IN MG/24 HR) 0.3 mg/24hr patch,  Place 0.3 mg onto the skin every Wednesday.   cloNIDine (CATAPRES) 0.2 MG tablet, Take 0.2 mg by mouth 3 (three) times daily.   Evolocumab (REPATHA SURECLICK) 140 MG/ML SOAJ, Inject 140 mg into the skin every 14 (fourteen) days.   irbesartan (AVAPRO) 300 MG tablet, TAKE 1 TABLET(300 MG) BY MOUTH IN THE MORNING   minoxidil (LONITEN)  2.5 MG tablet, Take 5 mg by mouth in the morning, at noon, and at bedtime.   nebivolol (BYSTOLIC) 5 MG tablet, Take 5 mg by mouth in the morning.   spironolactone (ALDACTONE) 100 MG tablet, Take 100 mg by mouth every evening.   Current Outpatient Medications (Analgesics):    aspirin EC 81 MG tablet, Take 81 mg by mouth every evening. Swallow whole.   aspirin-acetaminophen-caffeine (EXCEDRIN MIGRAINE) 250-250-65 MG tablet, Take 1-2 tablets by mouth every 6 (six) hours as needed for headache (sinus headaches/pain).  Current Outpatient Medications (Hematological):    vitamin B-12 (CYANOCOBALAMIN) 500 MCG tablet, Take 1,000 mcg by mouth every evening.  Current Outpatient Medications (Other):    ALPRAZolam (XANAX) 0.25 MG tablet, Take 1 tablet (0.25 mg total) by mouth 2 (two) times daily as needed for anxiety (prior to dentist).   cholecalciferol (VITAMIN D3) 25 MCG (1000 UT) tablet, Take 1,000 Units by mouth daily.   diazepam (VALIUM) 5 MG tablet, Take 1 tablet an hour prior to MRI and may repeat if needed   fish oil-omega-3 fatty acids 1000 MG capsule, Take 2 g by mouth daily.   gabapentin (NEURONTIN) 100 MG capsule, Take 2 capsules (200 mg total) by mouth at bedtime.   latanoprost (XALATAN) 0.005 % ophthalmic solution, Place 1 drop into both eyes at bedtime.    metroNIDAZOLE (FLAGYL) 500 MG tablet, Take 1 tablet (500 mg total) by mouth 2 (two) times daily for 7 days.   Multiple Vitamin (MULTIVITAMIN WITH MINERALS) TABS tablet, Take 1 tablet by mouth every evening.   timolol (TIMOPTIC) 0.5 % ophthalmic solution, Place 1 drop into both eyes 2 (two) times  daily.   Reviewed prior external information including notes and imaging from  primary care provider As well as notes that were available from care everywhere and other healthcare systems.  Past medical history, social, surgical and family history all reviewed in electronic medical record.  No pertanent information unless stated regarding to the chief complaint.   Review of Systems:  No headache, visual changes, nausea, vomiting, diarrhea, constipation, dizziness, abdominal pain, skin rash, fevers, chills, night sweats, weight loss, swollen lymph nodes, body aches, joint swelling, chest pain, shortness of breath, mood changes. POSITIVE muscle aches  Objective  Blood pressure 120/88, pulse 62, height 5\' 5"  (1.651 m), weight 193 lb (87.5 kg), SpO2 98%.   General: No apparent distress alert and oriented x3 mood and affect normal, dressed appropriately.  HEENT: Pupils equal, extraocular movements intact  Respiratory: Patient's speak in full sentences and does not appear short of breath  Cardiovascular: No lower extremity edema, non tender, no erythema  Patient does have some crepitus of the knees noted, lateral tracking of the patella noted right greater than left.  Does have instability noted as well.  After informed written and verbal consent, patient was seated on exam table. Right knee was prepped with alcohol swab and utilizing anterolateral approach, patient's right knee space was injected with 4:1  marcaine 0.5%: Kenalog 40mg /dL. Patient tolerated the procedure well without immediate complications.  After informed written and verbal consent, patient was seated on exam table. Left knee was prepped with alcohol swab and utilizing anterolateral approach, patient's left knee space was injected with 4:1  marcaine 0.5%: Kenalog 40mg /dL. Patient tolerated the procedure well without immediate complications.   Impression and Recommendations:     The above documentation has been reviewed and is  accurate and complete Judi Saa, DO

## 2023-01-29 NOTE — Assessment & Plan Note (Signed)
Reviewed causes of PMB, including genitourinary syndrome of menopause, infection, trauma, polyps, urinary and GI etiologies, and malignancy. Recommend endometrial assessment with transvaginal ultrasound. Patient to RTO ion 2 wk.

## 2023-01-30 ENCOUNTER — Ambulatory Visit: Payer: Medicare Other | Admitting: Family Medicine

## 2023-01-30 ENCOUNTER — Encounter: Payer: Self-pay | Admitting: Family Medicine

## 2023-01-30 VITALS — BP 120/88 | HR 62 | Ht 65.0 in | Wt 193.0 lb

## 2023-01-30 DIAGNOSIS — M170 Bilateral primary osteoarthritis of knee: Secondary | ICD-10-CM

## 2023-01-30 NOTE — Patient Instructions (Addendum)
Are you injected both knees today See me again in 2-3 months

## 2023-01-30 NOTE — Assessment & Plan Note (Signed)
Chronic, worsening symptoms.  Discussed icing regimen and home exercises.  Still trying to stay active.  Patient will follow-up again in 2 to 3 months.

## 2023-01-31 ENCOUNTER — Ambulatory Visit: Payer: Medicare Other | Admitting: Family Medicine

## 2023-02-04 ENCOUNTER — Ambulatory Visit: Payer: Medicare Other | Admitting: Family Medicine

## 2023-02-06 ENCOUNTER — Encounter: Payer: Self-pay | Admitting: Podiatry

## 2023-02-06 ENCOUNTER — Ambulatory Visit: Payer: Medicare Other | Admitting: Podiatry

## 2023-02-06 DIAGNOSIS — N1831 Chronic kidney disease, stage 3a: Secondary | ICD-10-CM | POA: Diagnosis not present

## 2023-02-06 DIAGNOSIS — B351 Tinea unguium: Secondary | ICD-10-CM | POA: Diagnosis not present

## 2023-02-06 DIAGNOSIS — Q828 Other specified congenital malformations of skin: Secondary | ICD-10-CM | POA: Diagnosis not present

## 2023-02-06 DIAGNOSIS — M79674 Pain in right toe(s): Secondary | ICD-10-CM | POA: Diagnosis not present

## 2023-02-06 DIAGNOSIS — M79675 Pain in left toe(s): Secondary | ICD-10-CM

## 2023-02-06 DIAGNOSIS — E1122 Type 2 diabetes mellitus with diabetic chronic kidney disease: Secondary | ICD-10-CM

## 2023-02-06 NOTE — Progress Notes (Signed)
This patient returns to my office for at risk foot care.  This patient requires this care by a professional since this patient will be at risk due to having diabetes and CKD.  This patient is unable to cut nails herself since the patient cannot reach hernails.These nails are painful walking and wearing shoes.  She also has a painful callus left forefoot.This patient presents for at risk foot care today.  General Appearance  Alert, conversant and in no acute stress.  Vascular  Dorsalis pedis and posterior tibial  pulses are palpable  bilaterally.  Capillary return is within normal limits  bilaterally. Temperature is within normal limits  bilaterally.  Neurologic  Senn-Weinstein monofilament wire test within normal limits  bilaterally. Muscle power within normal limits bilaterally.  Nails Thick disfigured discolored nails with subungual debris  from hallux to fifth toes bilaterally. No evidence of bacterial infection or drainage bilaterally.  Orthopedic  No limitations of motion  feet .  No crepitus or effusions noted.  No bony pathology or digital deformities noted.  Skin  normotropic skin noted bilaterally.  No signs of infections or ulcers noted.   Porokeratosis sub 3 left foot   Onychomycosis  Pain in right toes  Pain in left toes  Consent was obtained for treatment procedures.   Mechanical debridement of nails 1-5  bilaterally performed with a nail nipper.  Filed with dremel without incident.  Debride porokeratosis with # 15 blade.   Return office visit   3 months                   Told patient to return for periodic foot care and evaluation due to potential at risk complications.   Helane Gunther DPM

## 2023-02-11 ENCOUNTER — Ambulatory Visit: Payer: Medicare Other

## 2023-02-11 ENCOUNTER — Encounter: Payer: Self-pay | Admitting: Obstetrics and Gynecology

## 2023-02-11 ENCOUNTER — Ambulatory Visit: Payer: Medicare Other | Admitting: Obstetrics and Gynecology

## 2023-02-11 VITALS — BP 110/72 | HR 64 | Wt 193.0 lb

## 2023-02-11 DIAGNOSIS — N950 Postmenopausal bleeding: Secondary | ICD-10-CM | POA: Diagnosis not present

## 2023-02-11 NOTE — Assessment & Plan Note (Signed)
TVUS with endometrial mass concerning for endometrial hyperplasia or malignancy. 2.6cm submucosal fibroid also noted.  Recommend endometrial sampling via EMB or dx hysteroscopy. Patient desires dx hysteroscopy.   Plan for diagnostic hysteroscopy, myosure myomectomy and endometrial biopsy, D&C. Discussed outpatient procedure. Reviewed that  recovery is usually 1-2 days.  Will need medical clearance.  All questions answered.

## 2023-02-11 NOTE — Progress Notes (Unsigned)
77 y.o. G62P1001 female with T2DM, CKD, stage 3, HTN, nocturnal hypoxia, hypothyroidism, obesity here for TVUS in setting of possible PMB. Hx notable for DM. Divorced.  No LMP recorded. Patient is postmenopausal.   At last appt, she: "Reports light pink discharge on panty liner over past week. No itching or irritation. Sexually active: no"  She was diagnosed with BV and treated with PO flagyl.  GYN HISTORY: No significant history  OB History  Gravida Para Term Preterm AB Living  1 1 1     1   SAB IAB Ectopic Multiple Live Births               # Outcome Date GA Lbr Len/2nd Weight Sex Type Anes PTL Lv  1 Term             Past Medical History:  Diagnosis Date   Allergy    seasonal allergies   Arthritis    bilateral knees   Cataract    not a surgical candidate at this time (05/29/2020)   Chronic kidney disease    COVID-19 04/2019   Diabetes mellitus without complication (HCC)    on meds   Diverticulosis of colon (without mention of hemorrhage)    Dysmetabolic syndrome X    Glaucoma    on meds   Gout    H/O hyperthyroidism    By patient h/o hyperthyroidism with protosis, s/p subtotal thyroidectomy. On no replacement therapy. Last thyroid functions 2013 in [place]  Plan Will need thyroid functions Feb '15 with recommendations to follow.     Heart murmur    hx of   Hyperlipidemia    on meds   Hypertension    on multiple meds   Hypothyroidism    on meds   Kidney lesion    On supplemental oxygen by nasal cannula    at bedtime   Pancreatic cyst    Tubular adenoma of colon     Past Surgical History:  Procedure Laterality Date   BIOPSY  06/27/2020   Procedure: BIOPSY;  Surgeon: Beverley Fiedler, MD;  Location: [facility];  Service: Gastroenterology;;   CESAREAN SECTION  1969   COLONOSCOPY  2016   JMP-MAC-2 day suprep (good)-TICS/TA   COLONOSCOPY WITH PROPOFOL N/A 06/27/2020   Procedure: COLONOSCOPY WITH PROPOFOL;  Surgeon: Beverley Fiedler, MD;  Location: [facility];  Service: Gastroenterology;  Laterality: N/A;   EYE SURGERY     LIPOMA EXCISION Right 11/24/2014   Procedure: EXCISION RIGHT SHOULDER LIPOMA;  Surgeon: Harriette Bouillon, MD;  Location: [facility];  Service: General;  Laterality: Right;   POLYPECTOMY  2016   TA   POLYPECTOMY  06/27/2020   Procedure: POLYPECTOMY;  Surgeon: Beverley Fiedler, MD;  Location: [facility];  Service: Gastroenterology;;   THYROIDECTOMY, PARTIAL  1981   TONSILLECTOMY     UMBILICAL HERNIA REPAIR  2000   WISDOM TOOTH EXTRACTION      Current Outpatient Medications on File Prior to Visit  Medication Sig Dispense Refill   ALPRAZolam (XANAX) 0.25 MG tablet Take 1 tablet (0.25 mg total) by mouth 2 (two) times daily as needed for anxiety (prior to dentist). 20 tablet 0   aspirin EC 81 MG tablet Take 81 mg by mouth every evening. Swallow whole.     aspirin-acetaminophen-caffeine (EXCEDRIN MIGRAINE) 250-250-65 MG tablet Take 1-2 tablets by mouth every 6 (six) hours as needed for headache (sinus headaches/pain).     atorvastatin (LIPITOR) 80 MG tablet TAKE 1 TABLET(80  MG) BY MOUTH DAILY 90 tablet 3   cholecalciferol (VITAMIN D3) 25 MCG (1000 UT) tablet Take 1,000 Units by mouth daily.     cloNIDine (CATAPRES - DOSED IN MG/24 HR) 0.3 mg/24hr patch Place 0.3 mg onto the skin every Wednesday.     cloNIDine (CATAPRES) 0.2 MG tablet Take 0.2 mg by mouth 3 (three) times daily.     diazepam (VALIUM) 5 MG tablet Take 1 tablet an hour prior to MRI and may repeat if needed 2 tablet 0   Evolocumab (REPATHA SURECLICK) 140 MG/ML SOAJ Inject 140 mg into the skin every 14 (fourteen) days. 2 mL 11   fish oil-omega-3 fatty acids 1000 MG capsule Take 2 g by mouth daily.     gabapentin (NEURONTIN) 100 MG capsule Take 2 capsules (200 mg total) by mouth at bedtime. 180 capsule 0   irbesartan (AVAPRO) 300 MG tablet TAKE 1 TABLET(300 MG) BY MOUTH IN THE MORNING 90 tablet 0   latanoprost (XALATAN) 0.005 % ophthalmic solution  Place 1 drop into both eyes at bedtime.      levothyroxine (SYNTHROID) 75 MCG tablet Take 75 mcg by mouth daily before breakfast.     metformin (FORTAMET) 500 MG (OSM) 24 hr tablet Take 500 mg by mouth daily with breakfast.     minoxidil (LONITEN) 2.5 MG tablet Take 5 mg by mouth in the morning, at noon, and at bedtime.     Multiple Vitamin (MULTIVITAMIN WITH MINERALS) TABS tablet Take 1 tablet by mouth every evening.     nebivolol (BYSTOLIC) 5 MG tablet Take 5 mg by mouth in the morning.     spironolactone (ALDACTONE) 100 MG tablet Take 100 mg by mouth every evening.     timolol (TIMOPTIC) 0.5 % ophthalmic solution Place 1 drop into both eyes 2 (two) times daily.     vitamin B-12 (CYANOCOBALAMIN) 500 MCG tablet Take 1,000 mcg by mouth every evening.     No current facility-administered medications on file prior to visit.    Allergies  Allergen Reactions   Amlodipine     Gingival hyperplasia   Hctz [Hydrochlorothiazide] Other (See Comments)    gout   Jardiance [Empagliflozin] Other (See Comments)    Yeast Infections      PE Today's Vitals   02/11/23 1432  BP: 110/72  Pulse: 64  SpO2: 99%  Weight: 193 lb (87.5 kg)    Body mass index is 32.12 kg/m.  Physical Exam Vitals reviewed.  Constitutional:      General: She is not in acute distress.    Appearance: Normal appearance.  HENT:     Head: Normocephalic and atraumatic.     Nose: Nose normal.  Eyes:     Extraocular Movements: Extraocular movements intact.     Conjunctiva/sclera: Conjunctivae normal.  Pulmonary:     Effort: Pulmonary effort is normal.  Musculoskeletal:        General: Normal range of motion.     Cervical back: Normal range of motion.  Neurological:     General: No focal deficit present.     Mental Status: She is alert.  Psychiatric:        Mood and Affect: Mood normal.        Behavior: Behavior normal.     TVUS Indications: Postmenopausal bleeding   Findings:    Uterus: Retroverted  multifibroid uterus measuring 8.5 x 5.6 x 5.7 cm. Endometrial: Heterogeneous endometrium with vascular echogenic cystic mass measuring 26 x 15 mm.  Free fluid noted  in the endometrium.  Endometrial thickness 2.7 mm. Fibroids: 1-2.6 x 2.5 cm, type 2 2-2.7 x 2.5 cm, type 5 3-2.1 x 1.9 cm, type 3 4-1.8 x 1.4 cm, type 4 5-1.2 x 1.2 cm, type 4 6-1.0 x 1.1 cm, type 4 Left ovary: 1.8 x 1.9 x 1.5 cm Right ovary: 2.2 x 1.9 x 1.6 cm No free fluid.   Impression:  1.  Multifibroid uterus. 2.  Vascular endometrial mass measuring 26 mm with endometrial free fluid, concerning for endometrial hyperplasia or malignancy.   Rosalyn Gess, MD   Assessment and Plan:        PMB (postmenopausal bleeding) Assessment & Plan: TVUS with endometrial mass concerning for endometrial hyperplasia or malignancy. 2.6cm submucosal fibroid also noted.  Recommend endometrial sampling via EMB or dx hysteroscopy. Patient desires dx hysteroscopy.   Plan for diagnostic hysteroscopy, myosure myomectomy and endometrial biopsy, D&C. Discussed outpatient procedure. Reviewed that  recovery is usually 1-2 days.  Will need medical clearance.  All questions answered.    Orders: -     Ambulatory Referral For Surgery Scheduling   Rosalyn Gess, MD

## 2023-02-12 ENCOUNTER — Ambulatory Visit: Payer: Medicare Other | Admitting: Obstetrics and Gynecology

## 2023-02-13 ENCOUNTER — Encounter: Payer: Self-pay | Admitting: Obstetrics and Gynecology

## 2023-03-05 DIAGNOSIS — E261 Secondary hyperaldosteronism: Secondary | ICD-10-CM | POA: Diagnosis not present

## 2023-03-05 DIAGNOSIS — Z01818 Encounter for other preprocedural examination: Secondary | ICD-10-CM | POA: Diagnosis not present

## 2023-03-05 DIAGNOSIS — I700 Atherosclerosis of aorta: Secondary | ICD-10-CM | POA: Diagnosis not present

## 2023-03-05 DIAGNOSIS — I10 Essential (primary) hypertension: Secondary | ICD-10-CM | POA: Diagnosis not present

## 2023-03-05 DIAGNOSIS — E1122 Type 2 diabetes mellitus with diabetic chronic kidney disease: Secondary | ICD-10-CM | POA: Diagnosis not present

## 2023-03-05 DIAGNOSIS — E7800 Pure hypercholesterolemia, unspecified: Secondary | ICD-10-CM | POA: Diagnosis not present

## 2023-03-05 NOTE — Progress Notes (Signed)
Cardiology Office Note:   Date:  03/07/2023  ID:  Jade Sullivan, DOB 04-17-1945, MRN [id number] PCP:  Jarrett Soho, PA-C  [facility] Providers Cardiologist:  Alverda Skeans, MD Referring MD: Jarrett Soho, PA-C  Chief Complaint/Reason for Referral: Cardiology follow-up ASSESSMENT:    1. Aortic atherosclerosis (HCC)   2. Type 2 diabetes mellitus without complication, without long-term current use of insulin (HCC)   3. Hypertension associated with diabetes (HCC)   4. Hyperlipidemia associated with type 2 diabetes mellitus (HCC)   5. Elevated Lp(a)   6. Stage 3a chronic kidney disease (HCC)   7. BMI 33.0-33.9,adult   8. Bifascicular block   9. Left ventricular hypertrophy     PLAN:   In order of problems listed above: Aortic atherosclerosis: Continue aspirin, atorvastatin, and Repatha. Type 2 diabetes mellitus: Continue aspirin, atorvastatin, Avapro; defer SGLT2 inhibitor due to intolerance. Hypertension: Blood pressures are well-controlled at home.  We will monitor for now.  If blood pressure elevated the next time we see her then I think we will need to address this with the addition of spironolactone.   Hyperlipidemia: Check lipid panel and LFTs today.  Goal LDL is less than 70. Elevated LP(a): On Repatha; has had an excellent response.  Appreciate Dr. Blanchie Dessert management. CKD stage III: Continue Avapro; unable to tolerate SGLT2 inhibitor. Elevated BMI: Given history of diabetes will refer to pharmacy for recommendations regarding GLP-1 receptor agonist therapy to decrease future risk of myocardial infarction. Bifascicular block: Given presence of LVH and bifascicular block will refer for cardiac MRI to evaluate further.  Will prescribe as needed Ativan for procedure. LVH: See #8 above.            Dispo:  Return in about 6 months (around 09/05/2023).      Medication Adjustments/Labs and Tests Ordered: Current medicines are reviewed at length with the patient  today.  Concerns regarding medicines are outlined above.  The following changes have been made:  no change   Labs/tests ordered: Orders Placed This Encounter  Procedures   MR CARDIAC MORPHOLOGY W WO CONTRAST   Lipid panel   Hepatic function panel   AMB Referral to [facility] Pharm-D    Medication Changes: Meds ordered this encounter  Medications   LORazepam (ATIVAN) 1 MG tablet    Sig: Take 1 tablet (1 mg total) by mouth once for 1 dose. Take 1 tablet prior to your cardiac MRI    Dispense:  1 tablet    Refill:  0    Current medicines are reviewed at length with the patient today.  The patient does not have concerns regarding medicines.  I spent 33 minutes reviewing all clinical data during and prior to this visit including all relevant imaging studies, laboratories, clinical information from other health systems and prior notes from both Cardiology and other specialties, interviewing the patient, conducting a complete physical examination, and coordinating care in order to formulate a comprehensive and personalized evaluation and treatment plan.  History of Present Illness:      FOCUSED PROBLEM LIST:   Aortic atherosclerosis CT 2021 Type 2 diabetes mellitus; not on insulin Intolerant of SGLT2 inhibitors due to yeast infection Hypertension LVH TTE 2024 Hyperlipidemia LP(a) 123 >>> 55 on Repatha Hypothyroidism CKD stage IIIa BMI 33 First-degree AV block and LAFB  December 2022: Patient was seen for initial consultation regarding incidentally noted aortic atherosclerosis on a noncontrast CT scan done some years ago.  At that appointment the patient was doing very well however  her blood pressure was quite elevated.  She was started on atorvastatin 40 mg, Jardiance 10 mg and given a blood pressure of 180/98 referred for blood pressure check in 1 month.   June 2023: In the interim the patient's blood pressure has been checked multiple times by multiple providers and been in the  130s over 80s.  She was seen by internal medicine in February and her blood pressure was 130/74.  Plan: Increase atorvastatin to 80 mg and check lipid panel, LFTs, and LP(a) in 2 months.  December 2024: In the interim she was seen by general cardiology in June of this year and was doing well.  No medication changes were made.  She had also been seen by Dr. Rennis Golden in started on Repatha with significant decrease in her LP(a).  The patient is doing well.  She denies any chest pain or significant shortness of breath.  She is required no emergency room visits or hospitalizations.  She tells me her blood pressures at home are really very well-controlled usually in the 120s or 130s systolic.  This also is the case when she goes to other doctors.  She is otherwise well and tolerating her medical therapy without any issues.          Current Medications: Current Meds  Medication Sig   ALPRAZolam (XANAX) 0.25 MG tablet Take 1 tablet (0.25 mg total) by mouth 2 (two) times daily as needed for anxiety (prior to dentist).   aspirin EC 81 MG tablet Take 81 mg by mouth every evening. Swallow whole.   aspirin-acetaminophen-caffeine (EXCEDRIN MIGRAINE) 250-250-65 MG tablet Take 1-2 tablets by mouth every 6 (six) hours as needed for headache (sinus headaches/pain).   atorvastatin (LIPITOR) 80 MG tablet TAKE 1 TABLET(80 MG) BY MOUTH DAILY   cholecalciferol (VITAMIN D3) 25 MCG (1000 UT) tablet Take 1,000 Units by mouth daily.   cloNIDine (CATAPRES - DOSED IN MG/24 HR) 0.3 mg/24hr patch Place 0.3 mg onto the skin every Wednesday.   cloNIDine (CATAPRES - DOSED IN MG/24 HR) 0.3 mg/24hr patch 0.3 mg once a week.   cloNIDine (CATAPRES) 0.2 MG tablet Take 0.2 mg by mouth 3 (three) times daily.   Evolocumab (REPATHA SURECLICK) 140 MG/ML SOAJ Inject 140 mg into the skin every 14 (fourteen) days.   fish oil-omega-3 fatty acids 1000 MG capsule Take 2 g by mouth daily.   gabapentin (NEURONTIN) 100 MG capsule Take 2 capsules  (200 mg total) by mouth at bedtime.   irbesartan (AVAPRO) 300 MG tablet TAKE 1 TABLET(300 MG) BY MOUTH IN THE MORNING   latanoprost (XALATAN) 0.005 % ophthalmic solution Place 1 drop into both eyes at bedtime.    levothyroxine (SYNTHROID) 75 MCG tablet Take 75 mcg by mouth daily before breakfast.   LORazepam (ATIVAN) 1 MG tablet Take 1 tablet (1 mg total) by mouth once for 1 dose. Take 1 tablet prior to your cardiac MRI   metformin (FORTAMET) 500 MG (OSM) 24 hr tablet Take 500 mg by mouth daily with breakfast.   minoxidil (LONITEN) 2.5 MG tablet Take 5 mg by mouth in the morning, at noon, and at bedtime.   Multiple Vitamin (MULTIVITAMIN WITH MINERALS) TABS tablet Take 1 tablet by mouth every evening.   nebivolol (BYSTOLIC) 5 MG tablet Take 5 mg by mouth in the morning.   spironolactone (ALDACTONE) 100 MG tablet Take 100 mg by mouth every evening.   timolol (TIMOPTIC) 0.5 % ophthalmic solution Place 1 drop into both eyes 2 (two) times daily.  vitamin B-12 (CYANOCOBALAMIN) 500 MCG tablet Take 1,000 mcg by mouth every evening.     Review of Systems:   Please see the history of present illness.    All other systems reviewed and are negative.     EKGs/Labs/Other Test Reviewed:   EKG: EKG from June 2024 demonstrates a first-degree AV block and left anterior fascicular block  EKG Interpretation Date/Time:    Ventricular Rate:    PR Interval:    QRS Duration:    QT Interval:    QTC Calculation:   R Axis:      Text Interpretation:           Risk Assessment/Calculations:          Physical Exam:   VS:  BP (!) 140/90   Pulse 71   Ht 5\' 5"  (1.651 m)   Wt 192 lb (87.1 kg)   SpO2 99%   BMI 31.95 kg/m    HYPERTENSION CONTROL Vitals:   03/07/23 1038 03/07/23 1104  BP: (!) 154/90 (!) 140/90    The patient's blood pressure is elevated above target today.  In order to address the patient's elevated BP: The blood pressure is usually elevated in clinic.  Blood pressures monitored  at home have been optimal.      Wt Readings from Last 3 Encounters:  03/07/23 192 lb (87.1 kg)  02/11/23 193 lb (87.5 kg)  01/30/23 193 lb (87.5 kg)      GENERAL:  No apparent distress, AOx3 HEENT:  No carotid bruits, +2 carotid impulses, no scleral icterus CAR: RRR no murmurs, gallops, rubs, or thrills RES:  Clear to auscultation bilaterally ABD:  Soft, nontender, nondistended, positive bowel sounds x 4 VASC:  +2 radial pulses, +2 carotid pulses NEURO:  CN 2-12 grossly intact; motor and sensory grossly intact PSYCH:  No active depression or anxiety EXT:  No edema, ecchymosis, or cyanosis  Signed, Orbie Pyo, MD  03/07/2023 11:06 AM    [facility] [address] [place], [place], [place]  [address] Phone: [phone]; Fax: [phone]   Note:  This document was prepared using Dragon voice recognition software and may include unintentional dictation errors.

## 2023-03-07 ENCOUNTER — Ambulatory Visit: Payer: Medicare Other | Attending: Internal Medicine | Admitting: Internal Medicine

## 2023-03-07 ENCOUNTER — Encounter: Payer: Self-pay | Admitting: Internal Medicine

## 2023-03-07 VITALS — BP 140/90 | HR 71 | Ht 65.0 in | Wt 192.0 lb

## 2023-03-07 DIAGNOSIS — I452 Bifascicular block: Secondary | ICD-10-CM | POA: Diagnosis not present

## 2023-03-07 DIAGNOSIS — I700 Atherosclerosis of aorta: Secondary | ICD-10-CM | POA: Diagnosis not present

## 2023-03-07 DIAGNOSIS — N1831 Chronic kidney disease, stage 3a: Secondary | ICD-10-CM | POA: Diagnosis not present

## 2023-03-07 DIAGNOSIS — I152 Hypertension secondary to endocrine disorders: Secondary | ICD-10-CM

## 2023-03-07 DIAGNOSIS — E119 Type 2 diabetes mellitus without complications: Secondary | ICD-10-CM

## 2023-03-07 DIAGNOSIS — E1159 Type 2 diabetes mellitus with other circulatory complications: Secondary | ICD-10-CM

## 2023-03-07 DIAGNOSIS — I517 Cardiomegaly: Secondary | ICD-10-CM | POA: Diagnosis not present

## 2023-03-07 DIAGNOSIS — E7841 Elevated Lipoprotein(a): Secondary | ICD-10-CM | POA: Diagnosis not present

## 2023-03-07 DIAGNOSIS — E785 Hyperlipidemia, unspecified: Secondary | ICD-10-CM | POA: Diagnosis not present

## 2023-03-07 DIAGNOSIS — Z6833 Body mass index (BMI) 33.0-33.9, adult: Secondary | ICD-10-CM

## 2023-03-07 DIAGNOSIS — E1169 Type 2 diabetes mellitus with other specified complication: Secondary | ICD-10-CM | POA: Diagnosis not present

## 2023-03-07 MED ORDER — LORAZEPAM 1 MG PO TABS: 1.0000 mg | ORAL_TABLET | Freq: Once | ORAL | 0 refills | Status: AC

## 2023-03-07 NOTE — Patient Instructions (Signed)
Medication Instructions:  Your physician recommends that you continue on your current medications as directed. Please refer to the Current Medication list given to you today.  *If you need a refill on your cardiac medications before your next appointment, please call your pharmacy*  Lab Work: TODAY: Lipids and LFT's If you have labs (blood work) drawn today and your tests are completely normal, you will receive your results only by: MyChart Message (if you have MyChart) OR A paper copy in the mail If you have any lab test that is abnormal or we need to change your treatment, we will call you to review the results.  Testing/Procedures: Your physician has requested that you have a cardiac MRI. Cardiac MRI uses a computer to create images of your heart as its beating, producing both still and moving pictures of your heart and major blood vessels. For further information please visit [email or web address]. Please follow the instruction sheet given to you today for more information.  Follow-Up: At [facility], you and your health needs are our priority.  As part of our continuing mission to provide you with exceptional heart care, we have created designated Provider Care Teams.  These Care Teams include your primary Cardiologist (physician) and Advanced Practice Providers (APPs -  Physician Assistants and Nurse Practitioners) who all work together to provide you with the care you need, when you need it.  Your next appointment:   6 months  Provider:   Jari Favre, PA-C  Other Instructions   You are scheduled for Cardiac MRI at the location below.  Please arrive for your appointment at ______________ . ?  [facility] [address] [place], [place] [address] [phone] Please take advantage of the free valet parking available at the [facility] and [employer] (Entrance C).  Proceed to the [facility] Radiology Department (First Floor) for check-in.   Magnetic  resonance imaging (MRI) is a painless test that produces images of the inside of the body without using Xrays.  During an MRI, strong magnets and radio waves work together in a Data processing manager to form detailed images.   MRI images may provide more details about a medical condition than X-rays, CT scans, and ultrasounds can provide.  You may be given earphones to listen for instructions.  You may eat a light breakfast and take medications as ordered with the exception of furosemide, hydrochlorothiazide, chlorthalidone or spironolactone (or any other fluid pill). If you are undergoing a stress MRI, please avoid stimulants for 12 hr prior to test. (I.e. Caffeine, nicotine, chocolate, or antihistamine medications)  An IV will be inserted into one of your veins. Contrast material will be injected into your IV. It will leave your body through your urine within a day. You may be told to drink plenty of fluids to help flush the contrast material out of your system.  You will be asked to remove all metal, including: Watch, jewelry, and other metal objects including hearing aids, hair pieces and dentures. Also wearable glucose monitoring systems (ie. Freestyle Libre and Omnipods) (Braces and fillings normally are not a problem.)   TEST WILL TAKE APPROXIMATELY 1 HOUR  PLEASE NOTIFY SCHEDULING AT LEAST 24 HOURS IN ADVANCE IF YOU ARE UNABLE TO KEEP YOUR APPOINTMENT. [phone]  For more information and frequently asked questions, please visit our website : [email or web address]  Please call the Cardiac Imaging Nurse Navigators with any questions/concerns. [phone] Office

## 2023-03-08 LAB — HEPATIC FUNCTION PANEL
ALT: 12 [IU]/L (ref 0–32)
AST: 18 [IU]/L (ref 0–40)
Albumin: 4.1 g/dL (ref 3.8–4.8)
Alkaline Phosphatase: 82 [IU]/L (ref 44–121)
Bilirubin Total: 0.5 mg/dL (ref 0.0–1.2)
Bilirubin, Direct: 0.24 mg/dL (ref 0.00–0.40)
Total Protein: 6.2 g/dL (ref 6.0–8.5)

## 2023-03-08 LAB — LIPID PANEL
Chol/HDL Ratio: 1.5 {ratio} (ref 0.0–4.4)
Cholesterol, Total: 65 mg/dL — ABNORMAL LOW (ref 100–199)
HDL: 44 mg/dL (ref >39)
LDL Chol Calc (NIH): 8 mg/dL (ref 0–99)
Triglycerides: 44 mg/dL (ref 0–149)
VLDL Cholesterol Cal: 13 mg/dL (ref 5–40)

## 2023-03-11 ENCOUNTER — Other Ambulatory Visit: Payer: Self-pay | Admitting: *Deleted

## 2023-03-11 MED ORDER — ATORVASTATIN CALCIUM 40 MG PO TABS: 40.0000 mg | ORAL_TABLET | Freq: Every day | ORAL | 3 refills | Status: DC

## 2023-03-11 NOTE — Progress Notes (Signed)
Per lab results.  Pt is to decrease atorvastatin to 40 mg daily.

## 2023-03-13 ENCOUNTER — Telehealth: Payer: Self-pay

## 2023-03-13 NOTE — Telephone Encounter (Signed)
Dr. Lynnette Caffey   You saw this patient on 03/07/2023..You documented:  "Given presence of LVH and bifascicular block will refer for cardiac MRI to evaluate further".  Will you please comment on her  cardiac risk to proceed with  Hysteroscopy, myosure myomectomy and endometrial biopsy, and D&C, or  would you rather wait until the MRI is completed before allowing her to move forward with surgery.   Please send your comment to P CV Pre-Op Pool.  Thank you! Joni Reining DNP, ANP, AACC.

## 2023-03-13 NOTE — Telephone Encounter (Signed)
   Pre-operative Risk Assessment    Patient Name: Jade Sullivan  DOB: 1945-08-15 MRN: [id number]  Last ov: 03/07/23 Dr. Lynnette Caffey  Upcoming visit: Unknown      Request for Surgical Clearance    Procedure:   hysteroscopy, myosure myomectomy and endometrial biopsy, D&C   Date of Surgery:  Clearance TBD                                 Surgeon:  Dr. Vertell Novak Group or Practice Name:  [facility]  Phone number:  [phone] Fax number:  [phone]   Type of Clearance Requested:   - Medical  - Pharmacy:  Hold Aspirin Not indicated    Type of Anesthesia:  Local standby MAC   Additional requests/questions:    SignedMarvell Fuller   03/13/2023, 3:14 PM

## 2023-03-14 NOTE — Telephone Encounter (Signed)
   Patient Name: Jade Sullivan  DOB: 1945-08-02 MRN: [id number]  Primary Cardiologist: Orbie Pyo, MD  Chart reviewed as part of pre-operative protocol coverage. Given past medical history and time since last visit, based on ACC/AHA guidelines, Jade Sullivan is at acceptable risk for the planned procedure without further cardiovascular testing.   She is at low risk for periop CV complications given low risk surgical procedure and ability to perform   The patient was advised that if she develops new symptoms prior to surgery to contact our office to arrange for a follow-up visit, and she verbalized understanding.  I will route this recommendation to the requesting party via Epic fax function and remove from pre-op pool.  Please call with questions.  Joni Reining, NP 03/14/2023, 7:18 AM

## 2023-03-28 ENCOUNTER — Encounter: Payer: Self-pay | Admitting: *Deleted

## 2023-04-03 ENCOUNTER — Encounter: Payer: Self-pay | Admitting: Obstetrics and Gynecology

## 2023-04-03 ENCOUNTER — Ambulatory Visit: Payer: Medicare Other | Admitting: Obstetrics and Gynecology

## 2023-04-03 VITALS — BP 124/78 | HR 68 | Ht 65.0 in | Wt 196.0 lb

## 2023-04-03 DIAGNOSIS — N950 Postmenopausal bleeding: Secondary | ICD-10-CM | POA: Diagnosis not present

## 2023-04-03 DIAGNOSIS — N9489 Other specified conditions associated with female genital organs and menstrual cycle: Secondary | ICD-10-CM

## 2023-04-03 MED ORDER — CLONIDINE HCL 0.2 MG PO TABS: 0.2000 mg | ORAL_TABLET | Freq: Three times a day (TID) | ORAL | Status: DC

## 2023-04-03 NOTE — Patient Instructions (Addendum)
 Do not take metformin on the day of surgery. Do not take Avapro on the day of surgery unless instructed by anesthesia. Do not take any supplements and as needed anxiety medications on the day of surgery.

## 2023-04-03 NOTE — H&P (View-Only) (Signed)
 78 y.o. G32P1001 female with postmenopausal bleeding, bifascicular block, LVH, T2DM, CKD, stage 3, HTN, nocturnal hypoxia, hypothyroidism, obesity here for preoperative appointment. Divorced. Son lives in [place].   At last appt, she reported: Reports light pink discharge on panty liner over past week. No itching or irritation. Sexually active: no  She denies any bleeding since then.  Glucose: Fastings ~140, PPV (1-2h after meal): 140-170s  She has been medically cleared by her PCP and cardiologist on 03/05/23 and 03/14/23 respectively.  GYN HISTORY: No significant history  OB History  Gravida Para Term Preterm AB Living  1 1 1   1   SAB IAB Ectopic Multiple Live Births          # Outcome Date GA Lbr Len/2nd Weight Sex Type Anes PTL Lv  1 Term             Past Medical History:  Diagnosis Date   Allergy    seasonal allergies   Arthritis    bilateral knees   Cataract    not a surgical candidate at this time (05/29/2020)   Chronic kidney disease    COVID-19 04/2019   Diabetes mellitus without complication (HCC)    on meds   Diverticulosis of colon (without mention of hemorrhage)    Dysmetabolic syndrome X    Glaucoma    on meds   Gout    H/O hyperthyroidism    By patient h/o hyperthyroidism with protosis, s/p subtotal thyroidectomy. On no replacement therapy. Last thyroid  functions 2013 in [place]   Plan Will need thyroid  functions Feb '15 with recommendations to follow.     Heart murmur    hx of   Hyperlipidemia    on meds   Hypertension    on multiple meds   Hypothyroidism    on meds   Kidney lesion    On supplemental oxygen  by nasal cannula    at bedtime   Pancreatic cyst    Tubular adenoma of colon     Past Surgical History:  Procedure Laterality Date   BIOPSY  06/27/2020   Procedure: BIOPSY;  Surgeon: Albertus Gordy HERO, MD;  Location: [facility];  Service: Gastroenterology;;   CESAREAN SECTION  1969   COLONOSCOPY  2016   JMP-MAC-2 day suprep  (good)-TICS/TA   COLONOSCOPY WITH PROPOFOL  N/A 06/27/2020   Procedure: COLONOSCOPY WITH PROPOFOL ;  Surgeon: Albertus Gordy HERO, MD;  Location: [facility];  Service: Gastroenterology;  Laterality: N/A;   EYE SURGERY     LIPOMA EXCISION Right 11/24/2014   Procedure: EXCISION RIGHT SHOULDER LIPOMA;  Surgeon: Debby Shipper, MD;  Location:  [facility];  Service: General;  Laterality: Right;   POLYPECTOMY  2016   TA   POLYPECTOMY  06/27/2020   Procedure: POLYPECTOMY;  Surgeon: Albertus Gordy HERO, MD;  Location: [facility];  Service: Gastroenterology;;   THYROIDECTOMY, PARTIAL  1981   TONSILLECTOMY     UMBILICAL HERNIA REPAIR  2000   WISDOM TOOTH EXTRACTION      Current Outpatient Medications on File Prior to Visit  Medication Sig Dispense Refill   ALPRAZolam  (XANAX ) 0.25 MG tablet Take 1 tablet (0.25 mg total) by mouth 2 (two) times daily as needed for anxiety (prior to dentist). 20 tablet 0   aspirin EC 81 MG tablet Take 81 mg by mouth every evening. Swallow whole.     aspirin-acetaminophen -caffeine (EXCEDRIN MIGRAINE) 250-250-65 MG tablet Take 1-2 tablets by mouth every 6 (six) hours as needed for headache (sinus headaches/pain).  atorvastatin  (LIPITOR) 40 MG tablet Take 1 tablet (40 mg total) by mouth daily. 90 tablet 3   cholecalciferol (VITAMIN D3) 25 MCG (1000 UT) tablet Take 1,000 Units by mouth daily.     cloNIDine  (CATAPRES  - DOSED IN MG/24 HR) 0.3 mg/24hr patch 0.3 mg once a week.     Evolocumab  (REPATHA  SURECLICK) 140 MG/ML SOAJ Inject 140 mg into the skin every 14 (fourteen) days. 2 mL 11   fish oil-omega-3 fatty acids 1000 MG capsule Take 2 g by mouth daily.     gabapentin  (NEURONTIN ) 100 MG capsule Take 2 capsules (200 mg total) by mouth at bedtime. 180 capsule 0   irbesartan  (AVAPRO ) 300 MG tablet TAKE 1 TABLET(300 MG) BY MOUTH IN THE MORNING 90 tablet 0   latanoprost  (XALATAN ) 0.005 % ophthalmic solution Place 1 drop into both eyes at bedtime.      levothyroxine   (SYNTHROID ) 75 MCG tablet Take 75 mcg by mouth daily before breakfast.     metformin  (FORTAMET ) 500 MG (OSM) 24 hr tablet Take 500 mg by mouth daily with breakfast.     minoxidil  (LONITEN ) 2.5 MG tablet Take 5 mg by mouth in the morning, at noon, and at bedtime.     Multiple Vitamin (MULTIVITAMIN WITH MINERALS) TABS tablet Take 1 tablet by mouth every evening.     nebivolol  (BYSTOLIC ) 5 MG tablet Take 5 mg by mouth in the morning.     spironolactone  (ALDACTONE ) 100 MG tablet Take 100 mg by mouth every evening.     timolol  (TIMOPTIC ) 0.5 % ophthalmic solution Place 1 drop into both eyes 2 (two) times daily.     vitamin B-12 (CYANOCOBALAMIN) 500 MCG tablet Take 1,000 mcg by mouth every evening.     No current facility-administered medications on file prior to visit.    Allergies  Allergen Reactions   Amlodipine     Gingival hyperplasia   Hctz [Hydrochlorothiazide] Other (See Comments)    gout   Jardiance  [Empagliflozin ] Other (See Comments)    Yeast Infections      PE Today's Vitals   04/03/23 1430  BP: 124/78  Pulse: 68  SpO2: 98%  Weight: 196 lb (88.9 kg)  Height: 5' 5 (1.651 m)    Body mass index is 32.62 kg/m.  Physical Exam Vitals reviewed.  Constitutional:      General: She is not in acute distress.    Appearance: Normal appearance.  HENT:     Head: Normocephalic and atraumatic.     Nose: Nose normal.  Eyes:     Extraocular Movements: Extraocular movements intact.     Conjunctiva/sclera: Conjunctivae normal.  Cardiovascular:     Rate and Rhythm: Normal rate and regular rhythm.     Heart sounds: Murmur (grade 1 systolic murmur) heard.     No friction rub. No gallop.  Pulmonary:     Effort: Pulmonary effort is normal. No respiratory distress.     Breath sounds: Normal breath sounds. No stridor. No wheezing, rhonchi or rales.  Musculoskeletal:        General: Normal range of motion.     Cervical back: Normal range of motion.  Neurological:     General: No  focal deficit present.     Mental Status: She is alert.  Psychiatric:        Mood and Affect: Mood normal.        Behavior: Behavior normal.     Assessment and Plan:        Endometrial mass  Assessment & Plan: 02/11/2023 transvaginal ultrasound with 8 cm uterus with 26 mm vascular cystic mass and endometrial free fluid.  6 fibroids were measured, the largest being 2.7 cm.  There appeared to be a type II 2.6 cm fibroid present.  Planning for operative hysteroscopy, endometrial biopsy, dilation and curettage, possible myomectomy.  Discussed outpatient procedure. Reviewed that  recovery is usually 1-2 days. Risks including infections, bleeding, and damage to surrounding organs reviewed. Recommend NPO prior to midnight and reviewed medication to take on day of surgery. Dicussed use of NSAIDS as needed for pain postoperatively.  Preop checklist: Antibiotics: none DVT ppx: SCDs Postop visit: 1 week Additional clearance: completed medical and cardiology clearance, on file    PMB (postmenopausal bleeding) Assessment & Plan: See above   Other orders -     cloNIDine  HCl; Take 1 tablet (0.2 mg total) by mouth 3 (three) times daily.   Jade LULLA Pa, MD

## 2023-04-03 NOTE — Progress Notes (Signed)
 78 y.o. G32P1001 female with postmenopausal bleeding, bifascicular block, LVH, T2DM, CKD, stage 3, HTN, nocturnal hypoxia, hypothyroidism, obesity here for preoperative appointment. Divorced. Son lives in [place].   At last appt, she reported: Reports light pink discharge on panty liner over past week. No itching or irritation. Sexually active: no  She denies any bleeding since then.  Glucose: [place] ~140, PPV (1-2h after meal): 140-170s  She has been medically cleared by her PCP and cardiologist on 03/05/23 and 03/14/23 respectively.  GYN HISTORY: No significant history  OB History  Gravida Para Term Preterm AB Living  1 1 1   1   SAB IAB Ectopic Multiple Live Births          # Outcome Date GA Lbr Len/2nd Weight Sex Type Anes PTL Lv  1 Term             Past Medical History:  Diagnosis Date   Allergy    seasonal allergies   Arthritis    bilateral knees   Cataract    not a surgical candidate at this time (05/29/2020)   Chronic kidney disease    COVID-19 04/2019   Diabetes mellitus without complication (HCC)    on meds   Diverticulosis of colon (without mention of hemorrhage)    Dysmetabolic syndrome X    Glaucoma    on meds   Gout    H/O hyperthyroidism    By patient h/o hyperthyroidism with protosis, s/p subtotal thyroidectomy. On no replacement therapy. Last thyroid  functions 2013 in [place]   Plan Will need thyroid  functions Feb '15 with recommendations to follow.     Heart murmur    hx of   Hyperlipidemia    on meds   Hypertension    on multiple meds   Hypothyroidism    on meds   Kidney lesion    On supplemental oxygen  by nasal cannula    at bedtime   Pancreatic cyst    Tubular adenoma of colon     Past Surgical History:  Procedure Laterality Date   BIOPSY  06/27/2020   Procedure: BIOPSY;  Surgeon: Albertus Gordy HERO, MD;  Location: [facility];  Service: Gastroenterology;;   CESAREAN SECTION  1969   COLONOSCOPY  2016   JMP-MAC-2 day suprep  (good)-TICS/TA   COLONOSCOPY WITH PROPOFOL  N/A 06/27/2020   Procedure: COLONOSCOPY WITH PROPOFOL ;  Surgeon: Albertus Gordy HERO, MD;  Location: [facility];  Service: Gastroenterology;  Laterality: N/A;   EYE SURGERY     LIPOMA EXCISION Right 11/24/2014   Procedure: EXCISION RIGHT SHOULDER LIPOMA;  Surgeon: Debby Shipper, MD;  Location:  [facility];  Service: General;  Laterality: Right;   POLYPECTOMY  2016   TA   POLYPECTOMY  06/27/2020   Procedure: POLYPECTOMY;  Surgeon: Albertus Gordy HERO, MD;  Location: [facility];  Service: Gastroenterology;;   THYROIDECTOMY, PARTIAL  1981   TONSILLECTOMY     UMBILICAL HERNIA REPAIR  2000   WISDOM TOOTH EXTRACTION      Current Outpatient Medications on File Prior to Visit  Medication Sig Dispense Refill   ALPRAZolam  (XANAX ) 0.25 MG tablet Take 1 tablet (0.25 mg total) by mouth 2 (two) times daily as needed for anxiety (prior to dentist). 20 tablet 0   aspirin EC 81 MG tablet Take 81 mg by mouth every evening. Swallow whole.     aspirin-acetaminophen -caffeine (EXCEDRIN MIGRAINE) 250-250-65 MG tablet Take 1-2 tablets by mouth every 6 (six) hours as needed for headache (sinus headaches/pain).  atorvastatin  (LIPITOR) 40 MG tablet Take 1 tablet (40 mg total) by mouth daily. 90 tablet 3   cholecalciferol (VITAMIN D3) 25 MCG (1000 UT) tablet Take 1,000 Units by mouth daily.     cloNIDine  (CATAPRES  - DOSED IN MG/24 HR) 0.3 mg/24hr patch 0.3 mg once a week.     Evolocumab  (REPATHA  SURECLICK) 140 MG/ML SOAJ Inject 140 mg into the skin every 14 (fourteen) days. 2 mL 11   fish oil-omega-3 fatty acids 1000 MG capsule Take 2 g by mouth daily.     gabapentin  (NEURONTIN ) 100 MG capsule Take 2 capsules (200 mg total) by mouth at bedtime. 180 capsule 0   irbesartan  (AVAPRO ) 300 MG tablet TAKE 1 TABLET(300 MG) BY MOUTH IN THE MORNING 90 tablet 0   latanoprost  (XALATAN ) 0.005 % ophthalmic solution Place 1 drop into both eyes at bedtime.      levothyroxine   (SYNTHROID ) 75 MCG tablet Take 75 mcg by mouth daily before breakfast.     metformin  (FORTAMET ) 500 MG (OSM) 24 hr tablet Take 500 mg by mouth daily with breakfast.     minoxidil  (LONITEN ) 2.5 MG tablet Take 5 mg by mouth in the morning, at noon, and at bedtime.     Multiple Vitamin (MULTIVITAMIN WITH MINERALS) TABS tablet Take 1 tablet by mouth every evening.     nebivolol  (BYSTOLIC ) 5 MG tablet Take 5 mg by mouth in the morning.     spironolactone  (ALDACTONE ) 100 MG tablet Take 100 mg by mouth every evening.     timolol  (TIMOPTIC ) 0.5 % ophthalmic solution Place 1 drop into both eyes 2 (two) times daily.     vitamin B-12 (CYANOCOBALAMIN) 500 MCG tablet Take 1,000 mcg by mouth every evening.     No current facility-administered medications on file prior to visit.    Allergies  Allergen Reactions   Amlodipine     Gingival hyperplasia   Hctz [Hydrochlorothiazide] Other (See Comments)    gout   Jardiance  [Empagliflozin ] Other (See Comments)    Yeast Infections      PE Today's Vitals   04/03/23 1430  BP: 124/78  Pulse: 68  SpO2: 98%  Weight: 196 lb (88.9 kg)  Height: 5' 5 (1.651 m)    Body mass index is 32.62 kg/m.  Physical Exam Vitals reviewed.  Constitutional:      General: She is not in acute distress.    Appearance: Normal appearance.  HENT:     Head: Normocephalic and atraumatic.     Nose: Nose normal.  Eyes:     Extraocular Movements: Extraocular movements intact.     Conjunctiva/sclera: Conjunctivae normal.  Cardiovascular:     Rate and Rhythm: Normal rate and regular rhythm.     Heart sounds: Murmur (grade 1 systolic murmur) heard.     No friction rub. No gallop.  Pulmonary:     Effort: Pulmonary effort is normal. No respiratory distress.     Breath sounds: Normal breath sounds. No stridor. No wheezing, rhonchi or rales.  Musculoskeletal:        General: Normal range of motion.     Cervical back: Normal range of motion.  Neurological:     General: No  focal deficit present.     Mental Status: She is alert.  Psychiatric:        Mood and Affect: Mood normal.        Behavior: Behavior normal.     Assessment and Plan:        Endometrial mass  Assessment & Plan: 02/11/2023 transvaginal ultrasound with 8 cm uterus with 26 mm vascular cystic mass and endometrial free fluid.  6 fibroids were measured, the largest being 2.7 cm.  There appeared to be a type II 2.6 cm fibroid present.  Planning for operative hysteroscopy, endometrial biopsy, dilation and curettage, possible myomectomy.  Discussed outpatient procedure. Reviewed that  recovery is usually 1-2 days. Risks including infections, bleeding, and damage to surrounding organs reviewed. Recommend NPO prior to midnight and reviewed medication to take on day of surgery. Dicussed use of NSAIDS as needed for pain postoperatively.  Preop checklist: Antibiotics: none DVT ppx: SCDs Postop visit: 1 week Additional clearance: completed medical and cardiology clearance, on file    PMB (postmenopausal bleeding) Assessment & Plan: See above   Other orders -     cloNIDine  HCl; Take 1 tablet (0.2 mg total) by mouth 3 (three) times daily.   Jade LULLA Pa, MD

## 2023-04-03 NOTE — Assessment & Plan Note (Signed)
 02/11/2023 transvaginal ultrasound with 8 cm uterus with 26 mm vascular cystic mass and endometrial free fluid.  6 fibroids were measured, the largest being 2.7 cm.  There appeared to be a type II 2.6 cm fibroid present.  Planning for operative hysteroscopy, endometrial biopsy, dilation and curettage, possible myomectomy.  Discussed outpatient procedure. Reviewed that  recovery is usually 1-2 days. Risks including infections, bleeding, and damage to surrounding organs reviewed. Recommend NPO prior to midnight and reviewed medication to take on day of surgery. Dicussed use of NSAIDS as needed for pain postoperatively.  Preop checklist: Antibiotics: none DVT ppx: SCDs Postop visit: 1 week Additional clearance: completed medical and cardiology clearance, on file

## 2023-04-03 NOTE — Assessment & Plan Note (Addendum)
 See above

## 2023-04-04 NOTE — Progress Notes (Signed)
 [facility] [address] [place] [address] Phone: [phone] Subjective:   LILLETTE Berwyn Posey, am serving as a scribe for Dr. Arthea Claudene.  I'm seeing this patient by the request  of:  Katina Pfeiffer, PA-C  CC: bilateral knee pain f/u   YEP:Dlagzrupcz  01/30/2023 Chronic, worsening symptoms.  Discussed icing regimen and home exercises.  Still trying to stay active.  Patient will follow-up again in 2 to 3 months.   Updated 04/10/2023 SHERYL SAINTIL is a 78 y.o. female coming in with complaint of B knee pain. Patient states that her knees are sore and stiff. Pain is over lateral aspect of both knees.       Past Medical History:  Diagnosis Date   Allergy    seasonal allergies   Arthritis    bilateral knees   Cataract    not a surgical candidate at this time (05/29/2020)   Chronic kidney disease    COVID-19 04/2019   Diabetes mellitus without complication (HCC)    on meds   Diverticulosis of colon (without mention of hemorrhage)    Dysmetabolic syndrome X    Glaucoma    on meds   Gout    H/O hyperthyroidism    By patient h/o hyperthyroidism with protosis, s/p subtotal thyroidectomy. On no replacement therapy. Last thyroid  functions 2013 in [place]   Plan Will need thyroid  functions Feb '15 with recommendations to follow.     Heart murmur    hx of   Hyperlipidemia    on meds   Hypertension    on multiple meds   Hypothyroidism    on meds   Kidney lesion    On supplemental oxygen  by nasal cannula    at bedtime   Pancreatic cyst    Tubular adenoma of colon    Past Surgical History:  Procedure Laterality Date   BIOPSY  06/27/2020   Procedure: BIOPSY;  Surgeon: Albertus Gordy CHRISTELLA, MD;  Location: [facility];  Service: Gastroenterology;;   CESAREAN SECTION  1969   COLONOSCOPY  2016   JMP-MAC-2 day suprep (good)-TICS/TA   COLONOSCOPY WITH PROPOFOL  N/A 06/27/2020   Procedure: COLONOSCOPY WITH PROPOFOL ;  Surgeon: Albertus Gordy CHRISTELLA, MD;   Location: [facility];  Service: Gastroenterology;  Laterality: N/A;   EYE SURGERY     LIPOMA EXCISION Right 11/24/2014   Procedure: EXCISION RIGHT SHOULDER LIPOMA;  Surgeon: Debby Shipper, MD;  Location: [facility];  Service: General;  Laterality: Right;   POLYPECTOMY  2016   TA   POLYPECTOMY  06/27/2020   Procedure: POLYPECTOMY;  Surgeon: Albertus Gordy CHRISTELLA, MD;  Location: [facility];  Service: Gastroenterology;;   THYROIDECTOMY, PARTIAL  1981   TONSILLECTOMY     UMBILICAL HERNIA REPAIR  2000   WISDOM TOOTH EXTRACTION     Social History   Socioeconomic History   Marital status: Divorced    Spouse name: Not on file   Number of children: 1   Years of education: Not on file   Highest education level: Not on file  Occupational History   Occupation: Retired  Tobacco Use   Smoking status: Former   Smokeless tobacco: Never  Advertising Account Planner   Vaping status: Never Used  Substance and Sexual Activity   Alcohol use: No    Alcohol/week: 0.0 standard drinks of alcohol   Drug use: No   Sexual activity: Not Currently    Partners: Male    Birth control/protection: Post-menopausal    Comment:  1st intercourse- 16, partners- 5, divorced  Other Topics Concern   Not on file  Social History Narrative   Divorced   Retired - engineer, petroleum   Social Drivers of Corporate Investment Banker Strain: Low Risk  (10/10/2021)   Overall Financial Resource Strain (CARDIA)    Difficulty of Paying Living Expenses: Not hard at all  Food Insecurity: No Food Insecurity (12/20/2021)   Hunger Vital Sign    Worried About Running Out of Food in the Last Year: Never true    Ran Out of Food in the Last Year: Never true  Transportation Needs: No Transportation Needs (12/20/2021)   PRAPARE - Administrator, Civil Service (Medical): No    Lack of Transportation (Non-Medical): No  Physical Activity: Sufficiently Active (10/10/2021)   Exercise Vital Sign    Days of Exercise per Week: 5 days     Minutes of Exercise per Session: 30 min  Stress: No Stress Concern Present (10/10/2021)   [employer] of Occupational Health - Occupational Stress Questionnaire    Feeling of Stress : Not at all  Social Connections: Moderately Integrated (10/10/2021)   Social Connection and Isolation Panel [NHANES]    Frequency of Communication with Friends and Family: More than three times a week    Frequency of Social Gatherings with Friends and Family: Twice a week    Attends Religious Services: More than 4 times per year    Active Member of [employer] or Organizations: Yes    Attends Engineer, Structural: More than 4 times per year    Marital Status: Divorced   Allergies  Allergen Reactions   Amlodipine     Gingival hyperplasia   Hctz [Hydrochlorothiazide] Other (See Comments)    gout   Jardiance  [Empagliflozin ] Other (See Comments)    Yeast Infections   Family History  Problem Relation Age of Onset   Diabetes Mother        DM2   Hypertension Mother    Pneumonia Mother        died from this condition   Colon polyps Mother    Heart disease Father        died from this condition   Colon polyps Father    Kidney disease Sister        HBP and DM2; relative died from this condition   Colon polyps Sister    Heart disease Brother        MI - HBP and DM2   Colon polyps Brother    Colon polyps Brother     Current Outpatient Medications (Endocrine & Metabolic):    levothyroxine  (SYNTHROID ) 75 MCG tablet, Take 75 mcg by mouth daily before breakfast.   metformin  (FORTAMET ) 500 MG (OSM) 24 hr tablet, Take 500 mg by mouth daily with breakfast.  Current Outpatient Medications (Cardiovascular):    atorvastatin  (LIPITOR) 40 MG tablet, Take 1 tablet (40 mg total) by mouth daily.   cloNIDine  (CATAPRES  - DOSED IN MG/24 HR) 0.3 mg/24hr patch, 0.3 mg once a week.   cloNIDine  (CATAPRES ) 0.2 MG tablet, Take 1 tablet (0.2 mg total) by mouth 3 (three) times daily.   Evolocumab  (REPATHA   SURECLICK) 140 MG/ML SOAJ, Inject 140 mg into the skin every 14 (fourteen) days.   irbesartan  (AVAPRO ) 300 MG tablet, TAKE 1 TABLET(300 MG) BY MOUTH IN THE MORNING   minoxidil  (LONITEN ) 2.5 MG tablet, Take 5 mg by mouth in the morning, at noon, and at bedtime.   nebivolol  (BYSTOLIC )  5 MG tablet, Take 5 mg by mouth in the morning.   spironolactone  (ALDACTONE ) 100 MG tablet, Take 100 mg by mouth every evening.   Current Outpatient Medications (Analgesics):    aspirin EC 81 MG tablet, Take 81 mg by mouth every evening. Swallow whole.   aspirin-acetaminophen -caffeine (EXCEDRIN MIGRAINE) 250-250-65 MG tablet, Take 1-2 tablets by mouth every 6 (six) hours as needed for headache (sinus headaches/pain).  Current Outpatient Medications (Hematological):    vitamin B-12 (CYANOCOBALAMIN) 500 MCG tablet, Take 1,000 mcg by mouth every evening.  Current Outpatient Medications (Other):    ALPRAZolam  (XANAX ) 0.25 MG tablet, Take 1 tablet (0.25 mg total) by mouth 2 (two) times daily as needed for anxiety (prior to dentist).   cholecalciferol (VITAMIN D3) 25 MCG (1000 UT) tablet, Take 1,000 Units by mouth daily.   fish oil-omega-3 fatty acids 1000 MG capsule, Take 2 g by mouth daily.   gabapentin  (NEURONTIN ) 100 MG capsule, Take 2 capsules (200 mg total) by mouth at bedtime.   latanoprost  (XALATAN ) 0.005 % ophthalmic solution, Place 1 drop into both eyes at bedtime.    Multiple Vitamin (MULTIVITAMIN WITH MINERALS) TABS tablet, Take 1 tablet by mouth every evening.   timolol  (TIMOPTIC ) 0.5 % ophthalmic solution, Place 1 drop into both eyes 2 (two) times daily.   Reviewed prior external information including notes and imaging from  primary care provider As well as notes that were available from care everywhere and other healthcare systems.  Past medical history, social, surgical and family history all reviewed in electronic medical record.  No pertanent information unless stated regarding to the chief  complaint.   Review of Systems:  No headache, visual changes, nausea, vomiting, diarrhea, constipation, dizziness, abdominal pain, skin rash, fevers, chills, night sweats, weight loss, swollen lymph nodes, body aches, joint swelling, chest pain, shortness of breath, mood changes. POSITIVE muscle aches  Objective  Blood pressure 128/82, height 5' 5 (1.651 m), weight 193 lb (87.5 kg).   General: No apparent distress alert and oriented x3 mood and affect normal, dressed appropriately.  HEENT: Pupils equal, extraocular movements intact  Respiratory: Patient's speak in full sentences and does not appear short of breath  Cardiovascular: No lower extremity edema, non tender, no erythema  Bilateral knees do have some crepitus noted.  Instability of the knees bilaterally.  Prepatellar mild inflammation noted as well.  After informed written and verbal consent, patient was seated on exam table. Right knee was prepped with alcohol swab and utilizing anterolateral approach, patient's right knee space was injected with 8-1 marcaine  0.5%: Kenalog  40mg /dL. Patient tolerated the procedure well without immediate complications.  After informed written and verbal consent, patient was seated on exam table. Left knee was prepped with alcohol swab and utilizing anterolateral approach, patient's left knee space was injected with 8-1 marcaine  0.5%: Kenalog  40mg /dL. Patient tolerated the procedure well without immediate complications.    Impression and Recommendations:     The above documentation has been reviewed and is accurate and complete Vanisha Whiten M Adilee Lemme, DO

## 2023-04-09 ENCOUNTER — Encounter (HOSPITAL_BASED_OUTPATIENT_CLINIC_OR_DEPARTMENT_OTHER): Payer: Self-pay | Admitting: Obstetrics and Gynecology

## 2023-04-09 NOTE — Progress Notes (Signed)
 Spoke w/ via phone for pre-op interview--- [facility] needs dos----  ISTAT and CBG per anesthesia.       Lab results------Current EKG in Epic dated 09/25/22. COVID test -----patient states asymptomatic no test needed Arrive at -------0530 NPO after MN NO Solid Food.   Med rec completed Medications to take morning of surgery -----Bystolic  and Clonidine  Diabetic medication -----NONE AM of surgery. Patient instructed no nail polish to be worn day of surgery Patient instructed to bring photo id and insurance card day of surgery Patient aware to have Driver (ride ) / caregiver    for 24 hours after surgery - Son Jade Sullivan Patient Special Instructions -----Stopped ASA 04/08/23. Pre-Op special Instructions ----- Patient verbalized understanding of instructions that were given at this phone interview. Patient denies chest pain, sob, fever, cough at the interview.   Medical dated 03/05/23 and Cardiac clearance dated 03/13/23 with Lamarr Satterfield, NP on chart and in Epic.

## 2023-04-10 ENCOUNTER — Encounter: Payer: Self-pay | Admitting: Family Medicine

## 2023-04-10 ENCOUNTER — Ambulatory Visit: Payer: Medicare Other | Admitting: Family Medicine

## 2023-04-10 VITALS — BP 128/82 | Ht 65.0 in | Wt 193.0 lb

## 2023-04-10 DIAGNOSIS — M170 Bilateral primary osteoarthritis of knee: Secondary | ICD-10-CM

## 2023-04-10 NOTE — Assessment & Plan Note (Signed)
 Repeat steroid injections given today, tolerated the procedure well.  Could be a candidate for the viscosupplementation again.  Due to patient having a procedure did not want to do the full dose.  Did 1/2 cc of Kenalog  in each knee.  Encourage patient to monitor her blood sugar closely before her procedure.  Follow-up with me again in 8 to 10 weeks.

## 2023-04-10 NOTE — Patient Instructions (Signed)
 See me again in 9 weeks

## 2023-04-14 ENCOUNTER — Other Ambulatory Visit (HOSPITAL_COMMUNITY): Payer: Self-pay

## 2023-04-14 ENCOUNTER — Encounter: Payer: Self-pay | Admitting: Student

## 2023-04-14 ENCOUNTER — Telehealth: Payer: Self-pay | Admitting: Pharmacist

## 2023-04-14 ENCOUNTER — Ambulatory Visit: Payer: Medicare Other | Attending: Cardiology | Admitting: Student

## 2023-04-14 ENCOUNTER — Telehealth: Payer: Self-pay | Admitting: Pharmacy Technician

## 2023-04-14 VITALS — Ht 65.0 in | Wt 191.6 lb

## 2023-04-14 DIAGNOSIS — R634 Abnormal weight loss: Secondary | ICD-10-CM

## 2023-04-14 DIAGNOSIS — Z6833 Body mass index (BMI) 33.0-33.9, adult: Secondary | ICD-10-CM

## 2023-04-14 DIAGNOSIS — E119 Type 2 diabetes mellitus without complications: Secondary | ICD-10-CM

## 2023-04-14 MED ORDER — SEMAGLUTIDE(0.25 OR 0.5MG/DOS) 2 MG/3ML ~~LOC~~ SOPN: 0.2500 mg | PEN_INJECTOR | SUBCUTANEOUS | 0 refills | Status: DC

## 2023-04-14 NOTE — Telephone Encounter (Signed)
 Pharmacy Patient Advocate Encounter   Received notification from Pt Calls Messages that prior authorization for ozempic  is required/requested.   Insurance verification completed.   The patient is insured through  [employer]  .   Per test claim: The current 04/14/23 day co-pay is, $12.15 one month.  No PA needed at this time. This test claim was processed through [facility]- copay amounts may vary at other pharmacies due to pharmacy/plan contracts, or as the patient moves through the different stages of their insurance plan.

## 2023-04-14 NOTE — Anesthesia Preprocedure Evaluation (Addendum)
 Anesthesia Evaluation  Patient identified by MRN, date of birth, ID band Patient awake    Reviewed: Allergy & Precautions, NPO status , Patient's Chart, lab work & pertinent test results, reviewed documented beta blocker date and time   Airway Mallampati: III  TM Distance: >3 FB Neck ROM: Full    Dental  (+) Missing, Dental Advisory Given, Poor Dentition, Partial Lower, Partial Upper   Pulmonary neg recent URI, former smoker   breath sounds clear to auscultation       Cardiovascular hypertension, Pt. on medications and Pt. on home beta blockers + Valvular Problems/Murmurs AI  Rhythm:Regular Rate:Normal + Systolic murmurs Echo 09/2022  1. Left ventricular ejection fraction, by estimation, is 50 to 55%. Left ventricular ejection fraction by 3D volume is 53%. The left ventricle has low normal function. The left ventricle has no regional wall motion abnormalities. The left ventricular internal cavity size was mildly dilated. There is mild concentric left ventricular hypertrophy. Left ventricular diastolic parameters are consistent with Grade I diastolic dysfunction (impaired relaxation). The average left ventricular global longitudinal strain is -18.0%. The global longitudinal strain is normal.   2. Right ventricular systolic function is normal. The right ventricular size is normal. There is normal pulmonary artery systolic pressure.   3. Left atrial size was mildly dilated.   4. There is no evidence of cardiac tamponade. Moderate pleural effusion in the left lateral region.   5. The mitral valve is degenerative. Trivial mitral valve regurgitation. No evidence of mitral stenosis.   6. The aortic valve is tricuspid. Aortic valve regurgitation is moderate. No aortic stenosis is present.   7. Aortic dilatation noted. There is mild dilatation of the ascending aorta, measuring 44 mm.   8. Severely dilated pulmonary artery.   9. The inferior vena cava  is normal in size with greater than 50% respiratory variability, suggesting right atrial pressure of 3 mmHg.   Comparison(s): Prior images reviewed side by side. Aortic dilation and regurgitation have increased. Stable pleural effusion.     Neuro/Psych negative neurological ROS     GI/Hepatic negative GI ROS, Neg liver ROS,,,  Endo/Other  diabetes, Oral Hypoglycemic AgentsHypothyroidism    Renal/GU Renal disease     Musculoskeletal  (+) Arthritis ,    Abdominal  (+) + obese  Peds  Hematology HLD   Anesthesia Other Findings History of colon polyps  Reproductive/Obstetrics                             Anesthesia Physical Anesthesia Plan  ASA: 3  Anesthesia Plan: General   Post-op Pain Management: Tylenol  PO (pre-op)*   Induction: Intravenous  PONV Risk Score and Plan: 4 or greater and Treatment may vary due to age or medical condition, Ondansetron  and Dexamethasone   Airway Management Planned: LMA  Additional Equipment:   Intra-op Plan:   Post-operative Plan: Extubation in OR  Informed Consent: I have reviewed the patients History and Physical, chart, labs and discussed the procedure including the risks, benefits and alternatives for the proposed anesthesia with the patient or authorized representative who has indicated his/her understanding and acceptance.     Dental advisory given  Plan Discussed with: CRNA  Anesthesia Plan Comments:         Anesthesia Quick Evaluation

## 2023-04-14 NOTE — Patient Instructions (Signed)
GLP1 Agonist Titration Plan:  Will plan to follow the titration plan as below, pending patient is tolerating each dose before increasing to the next. Can slow titration if needed for tolerability.    Ozempic:  -Month 1: Inject Ozempic 0.25 mg SQ once weekly x 4 weeks -Month 2: Inject Ozempic 0.5 mg  SQ once weekly x 4 weeks -Month 3: Inject Ozempic 1 mg SQ once weekly x 4 weeks -Month 4+: Inject Ozempic 2 mg SQ once weekly  

## 2023-04-14 NOTE — Progress Notes (Signed)
 Patient ID: Jade Sullivan                 DOB: [birth year]                    MRN: [id number]     HPI: Jade Sullivan is a 78 y.o. female patient referred to pharmacy clinic by Dr.Thukkani  to initiate GLP1-RA therapy. PMH is significant for T2DM, HTN, CAD,endometrial mass, CKD stage 3, and obesity. Most recent BMI 32.12.  Baseline weight and BMI: 193 lbs 32.12  Current weight and BMI: 193 lbs 32.12  Current meds that affect weight: none  Diet:  Breakfast: eggs, bacon - low sodium  Lunch: none  Dinner: chicken ( grilled) salads  Snacks : chips and cookies    Exercise: none willing to start using home exercise equipment   Family History:  Relation Problem Comments  Mother (Deceased) Colon polyps   Diabetes DM2  Hypertension   Pneumonia died from this condition    Father (Deceased) Colon polyps   Heart disease died from this condition    Sister (Deceased) Colon polyps   Kidney disease HBP and DM2; relative died from this condition    Brother (Deceased) Colon polyps   Heart disease MI - HBP and DM2    Brother (Deceased)     Social History:  Alcohol: none  Smoking: quit 40 years ago   Labs: Lab Results  Component Value Date   HGBA1C 6.9 (H) 05/23/2021    Wt Readings from Last 1 Encounters:  04/10/23 193 lb (87.5 kg)    BP Readings from Last 1 Encounters:  04/10/23 128/82   Pulse Readings from Last 1 Encounters:  04/03/23 68       Component Value Date/Time   CHOL 65 (L) 03/07/2023 1119   TRIG 44 03/07/2023 1119   HDL 44 03/07/2023 1119   CHOLHDL 1.5 03/07/2023 1119   CHOLHDL 4 05/17/2020 1050   VLDL 21.0 05/17/2020 1050   LDLCALC 8 03/07/2023 1119    Past Medical History:  Diagnosis Date   Allergy    seasonal allergies   Arthritis    bilateral knees   Cataract    not a surgical candidate at this time (05/29/2020)   Chronic kidney disease    COVID-19 04/2019   Diabetes mellitus without complication (HCC)    on meds   Diverticulosis of  colon (without mention of hemorrhage)    Dysmetabolic syndrome X    Glaucoma    on meds   Gout    H/O hyperthyroidism    By patient h/o hyperthyroidism with protosis, s/p subtotal thyroidectomy. On no replacement therapy. Last thyroid  functions 2013 in [place]   Plan Will need thyroid  functions Feb '15 with recommendations to follow.     Heart murmur    hx of   Hyperlipidemia    on meds   Hypertension    on multiple meds   Hypothyroidism    on meds   Kidney lesion    On supplemental oxygen  by nasal cannula    at bedtime   Pancreatic cyst    Tubular adenoma of colon     Current Outpatient Medications on File Prior to Visit  Medication Sig Dispense Refill   ALPRAZolam  (XANAX ) 0.25 MG tablet Take 1 tablet (0.25 mg total) by mouth 2 (two) times daily as needed for anxiety (prior to dentist). 20 tablet 0   aspirin EC 81 MG tablet Take 81 mg by mouth every evening. Swallow  whole.     aspirin-acetaminophen -caffeine (EXCEDRIN MIGRAINE) 250-250-65 MG tablet Take 1-2 tablets by mouth every 6 (six) hours as needed for headache (sinus headaches/pain).     atorvastatin  (LIPITOR) 40 MG tablet Take 1 tablet (40 mg total) by mouth daily. 90 tablet 3   cholecalciferol (VITAMIN D3) 25 MCG (1000 UT) tablet Take 1,000 Units by mouth daily.     cloNIDine  (CATAPRES  - DOSED IN MG/24 HR) 0.3 mg/24hr patch 0.3 mg once a week.     cloNIDine  (CATAPRES ) 0.2 MG tablet Take 1 tablet (0.2 mg total) by mouth 3 (three) times daily.     Evolocumab  (REPATHA  SURECLICK) 140 MG/ML SOAJ Inject 140 mg into the skin every 14 (fourteen) days. 2 mL 11   fish oil-omega-3 fatty acids 1000 MG capsule Take 2 g by mouth daily.     gabapentin  (NEURONTIN ) 100 MG capsule Take 2 capsules (200 mg total) by mouth at bedtime. 180 capsule 0   irbesartan  (AVAPRO ) 300 MG tablet TAKE 1 TABLET(300 MG) BY MOUTH IN THE MORNING 90 tablet 0   latanoprost  (XALATAN ) 0.005 % ophthalmic solution Place 1 drop into both eyes at bedtime.       levothyroxine  (SYNTHROID ) 75 MCG tablet Take 75 mcg by mouth daily before breakfast.     metformin  (FORTAMET ) 500 MG (OSM) 24 hr tablet Take 500 mg by mouth daily with breakfast.     minoxidil  (LONITEN ) 2.5 MG tablet Take 5 mg by mouth in the morning, at noon, and at bedtime.     Multiple Vitamin (MULTIVITAMIN WITH MINERALS) TABS tablet Take 1 tablet by mouth every evening.     nebivolol  (BYSTOLIC ) 5 MG tablet Take 5 mg by mouth in the morning.     spironolactone  (ALDACTONE ) 100 MG tablet Take 100 mg by mouth every evening.     timolol  (TIMOPTIC ) 0.5 % ophthalmic solution Place 1 drop into both eyes 2 (two) times daily.     vitamin B-12 (CYANOCOBALAMIN) 500 MCG tablet Take 1,000 mcg by mouth every evening.     No current facility-administered medications on file prior to visit.    Allergies  Allergen Reactions   Amlodipine     Gingival hyperplasia   Hctz [Hydrochlorothiazide] Other (See Comments)    gout   Jardiance  [Empagliflozin ] Other (See Comments)    Yeast Infections     Assessment/Plan:  1. Weight loss - Patient has not met goal of at least 5% of body weight loss with comprehensive lifestyle modifications alone in the past 3-6 months. Pharmacotherapy is appropriate to pursue as augmentation. Will start Ozempic  . Confirmed patient has no personal or family history of medullary thyroid  carcinoma (MTC) or Multiple Endocrine Neoplasia syndrome type 2 (MEN 2). Injection technique reviewed at today's visit.  Advised patient on common side effects including nausea, diarrhea, dyspepsia, decreased appetite, and fatigue. Counseled patient on reducing meal size and how to titrate medication to minimize side effects. Counseled patient to call if intolerable side effects or if experiencing dehydration, abdominal pain, or dizziness. Patient will adhere to dietary modifications and will target at least 150 minutes of moderate intensity exercise weekly.   Follow up in 1 month via telephone for  tolerability update and dose titration.

## 2023-04-14 NOTE — Telephone Encounter (Signed)
 Call pt to inform about Ozempic PA and Co-pay. Will be starting Ozempic from next week as she has procedure due tomorrow. May be in 4 weeks will stop taking metformin if BG improves on low dose Ozempic.    F/u due on Feb 10 for further dose titration.

## 2023-04-14 NOTE — Addendum Note (Signed)
 Addended by: Tylene Fantasia on: 04/14/2023 01:19 PM   Modules accepted: Orders

## 2023-04-15 ENCOUNTER — Ambulatory Visit (HOSPITAL_BASED_OUTPATIENT_CLINIC_OR_DEPARTMENT_OTHER): Payer: Medicare Other | Admitting: Anesthesiology

## 2023-04-15 ENCOUNTER — Other Ambulatory Visit: Payer: Self-pay

## 2023-04-15 ENCOUNTER — Encounter (HOSPITAL_BASED_OUTPATIENT_CLINIC_OR_DEPARTMENT_OTHER): Payer: Self-pay | Admitting: Obstetrics and Gynecology

## 2023-04-15 ENCOUNTER — Ambulatory Visit (HOSPITAL_BASED_OUTPATIENT_CLINIC_OR_DEPARTMENT_OTHER)
Admission: RE | Admit: 2023-04-15 | Discharge: 2023-04-15 | Disposition: A | Discharge status: Home / Self Care | Payer: Medicare Other | Attending: Obstetrics and Gynecology | Admitting: Obstetrics and Gynecology

## 2023-04-15 ENCOUNTER — Encounter (HOSPITAL_BASED_OUTPATIENT_CLINIC_OR_DEPARTMENT_OTHER)
Admission: RE | Disposition: A | Discharge status: Home / Self Care | Payer: Self-pay | Source: Home / Self Care | Attending: Obstetrics and Gynecology

## 2023-04-15 DIAGNOSIS — I129 Hypertensive chronic kidney disease with stage 1 through stage 4 chronic kidney disease, or unspecified chronic kidney disease: Secondary | ICD-10-CM

## 2023-04-15 DIAGNOSIS — I517 Cardiomegaly: Secondary | ICD-10-CM | POA: Insufficient documentation

## 2023-04-15 DIAGNOSIS — Z79899 Other long term (current) drug therapy: Secondary | ICD-10-CM | POA: Diagnosis not present

## 2023-04-15 DIAGNOSIS — E785 Hyperlipidemia, unspecified: Secondary | ICD-10-CM | POA: Diagnosis not present

## 2023-04-15 DIAGNOSIS — E039 Hypothyroidism, unspecified: Secondary | ICD-10-CM | POA: Insufficient documentation

## 2023-04-15 DIAGNOSIS — Z7982 Long term (current) use of aspirin: Secondary | ICD-10-CM | POA: Insufficient documentation

## 2023-04-15 DIAGNOSIS — Z7984 Long term (current) use of oral hypoglycemic drugs: Secondary | ICD-10-CM | POA: Insufficient documentation

## 2023-04-15 DIAGNOSIS — D250 Submucous leiomyoma of uterus: Secondary | ICD-10-CM | POA: Diagnosis not present

## 2023-04-15 DIAGNOSIS — M1990 Unspecified osteoarthritis, unspecified site: Secondary | ICD-10-CM | POA: Insufficient documentation

## 2023-04-15 DIAGNOSIS — Z79890 Hormone replacement therapy (postmenopausal): Secondary | ICD-10-CM | POA: Insufficient documentation

## 2023-04-15 DIAGNOSIS — E1122 Type 2 diabetes mellitus with diabetic chronic kidney disease: Secondary | ICD-10-CM | POA: Insufficient documentation

## 2023-04-15 DIAGNOSIS — N840 Polyp of corpus uteri: Secondary | ICD-10-CM | POA: Diagnosis not present

## 2023-04-15 DIAGNOSIS — Z6831 Body mass index (BMI) 31.0-31.9, adult: Secondary | ICD-10-CM | POA: Diagnosis not present

## 2023-04-15 DIAGNOSIS — N1831 Chronic kidney disease, stage 3a: Secondary | ICD-10-CM

## 2023-04-15 DIAGNOSIS — N183 Chronic kidney disease, stage 3 unspecified: Secondary | ICD-10-CM

## 2023-04-15 DIAGNOSIS — R0902 Hypoxemia: Secondary | ICD-10-CM | POA: Insufficient documentation

## 2023-04-15 DIAGNOSIS — D259 Leiomyoma of uterus, unspecified: Secondary | ICD-10-CM | POA: Diagnosis not present

## 2023-04-15 DIAGNOSIS — N9489 Other specified conditions associated with female genital organs and menstrual cycle: Secondary | ICD-10-CM | POA: Diagnosis present

## 2023-04-15 DIAGNOSIS — Z87891 Personal history of nicotine dependence: Secondary | ICD-10-CM | POA: Diagnosis not present

## 2023-04-15 DIAGNOSIS — N950 Postmenopausal bleeding: Secondary | ICD-10-CM | POA: Insufficient documentation

## 2023-04-15 DIAGNOSIS — E669 Obesity, unspecified: Secondary | ICD-10-CM | POA: Insufficient documentation

## 2023-04-15 DIAGNOSIS — I10 Essential (primary) hypertension: Secondary | ICD-10-CM | POA: Diagnosis not present

## 2023-04-15 HISTORY — PX: DILATATION & CURETTAGE/HYSTEROSCOPY WITH MYOSURE: SHX6511

## 2023-04-15 LAB — GLUCOSE, CAPILLARY: Glucose-Capillary: 125 mg/dL — ABNORMAL HIGH (ref 70–99)

## 2023-04-15 LAB — POCT I-STAT, CHEM 8
BUN: 17 mg/dL (ref 8–23)
Calcium, Ion: 1.26 mmol/L (ref 1.15–1.40)
Chloride: 106 mmol/L (ref 98–111)
Creatinine, Ser: 1 mg/dL (ref 0.44–1.00)
Glucose, Bld: 154 mg/dL — ABNORMAL HIGH (ref 70–99)
HCT: 39 % (ref 36.0–46.0)
Hemoglobin: 13.3 g/dL (ref 12.0–15.0)
Potassium: 4.5 mmol/L (ref 3.5–5.1)
Sodium: 142 mmol/L (ref 135–145)
TCO2: 25 mmol/L (ref 22–32)

## 2023-04-15 SURGERY — DILATATION & CURETTAGE/HYSTEROSCOPY WITH MYOSURE
Anesthesia: General

## 2023-04-15 MED ORDER — OXYCODONE HCL 5 MG PO TABS
5.0000 mg | ORAL_TABLET | Freq: Once | ORAL | Status: AC | PRN
  Administered 2023-04-15: 5 mg via ORAL

## 2023-04-15 MED ORDER — SODIUM CHLORIDE 0.9 % IR SOLN
Status: DC | PRN
  Administered 2023-04-15 (×2): 3000 mL

## 2023-04-15 MED ORDER — OXYCODONE HCL 5 MG PO TABS
ORAL_TABLET | ORAL | Status: AC
  Filled 2023-04-15: qty 1

## 2023-04-15 MED ORDER — SODIUM CHLORIDE 0.9 % IV SOLN: INTRAVENOUS | Status: DC

## 2023-04-15 MED ORDER — LIDOCAINE HCL (PF) 1 % IJ SOLN
INTRAMUSCULAR | Status: DC | PRN
  Administered 2023-04-15: 10 mL

## 2023-04-15 MED ORDER — ACETAMINOPHEN 500 MG PO TABS
ORAL_TABLET | ORAL | Status: AC
  Filled 2023-04-15: qty 2

## 2023-04-15 MED ORDER — SILVER NITRATE-POT NITRATE 75-25 % EX MISC
CUTANEOUS | Status: DC | PRN
  Administered 2023-04-15: 2

## 2023-04-15 MED ORDER — ONDANSETRON HCL 4 MG/2ML IJ SOLN
INTRAMUSCULAR | Status: DC | PRN
  Administered 2023-04-15: 4 mg via INTRAVENOUS

## 2023-04-15 MED ORDER — PROPOFOL 10 MG/ML IV BOLUS
INTRAVENOUS | Status: AC
  Filled 2023-04-15: qty 20

## 2023-04-15 MED ORDER — PROPOFOL 10 MG/ML IV BOLUS
INTRAVENOUS | Status: DC | PRN
  Administered 2023-04-15: 140 mg via INTRAVENOUS

## 2023-04-15 MED ORDER — PHENYLEPHRINE 80 MCG/ML (10ML) SYRINGE FOR IV PUSH (FOR BLOOD PRESSURE SUPPORT)
PREFILLED_SYRINGE | INTRAVENOUS | Status: DC | PRN
  Administered 2023-04-15: 160 ug via INTRAVENOUS
  Administered 2023-04-15 (×3): 80 ug via INTRAVENOUS

## 2023-04-15 MED ORDER — ACETAMINOPHEN 500 MG PO TABS
1000.0000 mg | ORAL_TABLET | ORAL | Status: AC
Start: 2023-04-15 — End: 2023-04-15
  Administered 2023-04-15: 1000 mg via ORAL

## 2023-04-15 MED ORDER — FENTANYL CITRATE (PF) 100 MCG/2ML IJ SOLN: 25.0000 ug | INTRAMUSCULAR | Status: DC | PRN

## 2023-04-15 MED ORDER — DROPERIDOL 2.5 MG/ML IJ SOLN: 0.6250 mg | Freq: Once | INTRAMUSCULAR | Status: DC | PRN

## 2023-04-15 MED ORDER — LIDOCAINE 2% (20 MG/ML) 5 ML SYRINGE
INTRAMUSCULAR | Status: DC | PRN
  Administered 2023-04-15: 80 mg via INTRAVENOUS

## 2023-04-15 MED ORDER — IBUPROFEN 600 MG PO TABS: 600.0000 mg | ORAL_TABLET | Freq: Three times a day (TID) | ORAL | 0 refills | Status: DC | PRN

## 2023-04-15 MED ORDER — FENTANYL CITRATE (PF) 100 MCG/2ML IJ SOLN
INTRAMUSCULAR | Status: AC
  Filled 2023-04-15: qty 2

## 2023-04-15 MED ORDER — OXYCODONE HCL 5 MG/5ML PO SOLN
5.0000 mg | Freq: Once | ORAL | Status: AC | PRN
Start: 2023-04-15 — End: 2023-04-15

## 2023-04-15 MED ORDER — KETOROLAC TROMETHAMINE 30 MG/ML IJ SOLN
INTRAMUSCULAR | Status: DC | PRN
  Administered 2023-04-15: 30 mg via INTRAVENOUS

## 2023-04-15 MED ORDER — FENTANYL CITRATE (PF) 250 MCG/5ML IJ SOLN
INTRAMUSCULAR | Status: DC | PRN
  Administered 2023-04-15: 50 ug via INTRAVENOUS

## 2023-04-15 SURGICAL SUPPLY — 18 items
DEVICE MYOSURE LITE (MISCELLANEOUS) IMPLANT
DEVICE MYOSURE REACH (MISCELLANEOUS) IMPLANT
DILATOR CANAL MILEX (MISCELLANEOUS) IMPLANT
GAUZE 4X4 16PLY ~~LOC~~+RFID DBL (SPONGE) IMPLANT
GLOVE BIO SURGEON STRL SZ7.5 (GLOVE) ×1 IMPLANT
GLOVE BIOGEL PI IND STRL 7.5 (GLOVE) ×1 IMPLANT
GOWN STRL REUS W/TWL XL LVL3 (GOWN DISPOSABLE) ×1 IMPLANT
HIBICLENS CHG 4% 4OZ BTL (MISCELLANEOUS) ×1 IMPLANT
IV NS IRRIG 3000ML ARTHROMATIC (IV SOLUTION) ×1 IMPLANT
KIT PROCEDURE FLUENT (KITS) ×1 IMPLANT
KIT TURNOVER CYSTO (KITS) ×1 IMPLANT
MYOSURE XL FIBROID (MISCELLANEOUS) ×1
PACK VAGINAL MINOR WOMEN LF (CUSTOM PROCEDURE TRAY) ×1 IMPLANT
PAD OB MATERNITY 11 LF (PERSONAL CARE ITEMS) ×1 IMPLANT
PAD PREP 24X48 CUFFED NSTRL (MISCELLANEOUS) ×1 IMPLANT
SEAL ROD LENS SCOPE MYOSURE (ABLATOR) ×1 IMPLANT
SYSTEM TISS REMOVAL MYOSURE XL (MISCELLANEOUS) IMPLANT
TOWEL OR 17X24 6PK STRL BLUE (TOWEL DISPOSABLE) ×1 IMPLANT

## 2023-04-15 NOTE — Anesthesia Procedure Notes (Signed)
 Procedure Name: LMA Insertion Date/Time: 04/15/2023 7:38 AM  Performed by: Cena Epps, CRNAPre-anesthesia Checklist: Patient identified, Emergency Drugs available, Suction available and Patient being monitored Patient Re-evaluated:Patient Re-evaluated prior to induction Oxygen  Delivery Method: Circle System Utilized Preoxygenation: Pre-oxygenation with 100% oxygen  Induction Type: IV induction Ventilation: Mask ventilation without difficulty LMA: LMA inserted LMA Size: 4.0 Number of attempts: 1 Airway Equipment and Method: Bite block Placement Confirmation: positive ETCO2 Tube secured with: Tape Dental Injury: Teeth and Oropharynx as per pre-operative assessment

## 2023-04-15 NOTE — Anesthesia Postprocedure Evaluation (Signed)
 Anesthesia Post Note  Patient: Jade Sullivan  Procedure(s) Performed: DILATATION & CURETTAGE/HYSTEROSCOPY WITH MYOSURE Myomectomy and endometrial biopsy     Patient location during evaluation: PACU Anesthesia Type: General Level of consciousness: sedated and patient cooperative Pain management: pain level controlled Vital Signs Assessment: post-procedure vital signs reviewed and stable Respiratory status: spontaneous breathing Cardiovascular status: stable Anesthetic complications: no   No notable events documented.  Last Vitals:  Vitals:   04/15/23 0915 04/15/23 0930  BP: 132/77 130/76  Pulse: 63 63  Resp: 16 14  Temp:    SpO2: 90% 91%    Last Pain:  Vitals:   04/15/23 0951  TempSrc:   PainSc: 7                  Norleen Pope

## 2023-04-15 NOTE — Op Note (Addendum)
 04/15/2023 Jade Sullivan Balloon [id number]  OPERATIVE REPORT  Preop Diagnosis: Postmenopausal bleeding, endometrial mass, uterine fibroids  Post operative diagnosis: same  Procedure: Diagnostic hysteroscopy, myomectomy, polypectomy, dilation and curettage  Surgeon: Vera LULLA Pa, MD Assistant: none  Anesthesia: MAC Fluids: please see anesthesia report Fluid deficit: 825cc  Complications: None  Findings: Normal cervix. Uterine cavity measuring 8cm with anterior submucosal fibroid and endometrial polyp. Both ostia were seen.  Estimated blood loss: Minimal  Specimens:  ID Type Source Tests Collected by Time Destination  1 : Endometrial fibroid and polyp Tissue PATH Soft tissue SURGICAL PATHOLOGY Pa Vera LULLA, MD 04/15/2023 0719   2 : [facility] Tissue PATH Soft tissue SURGICAL PATHOLOGY Pa Vera LULLA, MD 04/15/2023 [phone]     Disposition of specimen: Pathology    Procedure: Patient was taken to the OR where she was placed in dorsal lithotomy in stirrups. She voided prior to transport. SCDs were in place.  The patient was prepped and draped in the usual sterile fashion. An adequate timeout was obtained and everyone agreed. A bivalve speculum was placed inside the vagina and the cervix visualized. The cervix was grasped anteriorly with a single-tooth tenaculum. Paracervical block was performed with 1% lidocaine .  The uterus sounded to 8 cm. Sequential cervical dilation was done to 34fr, and the myosure hysteroscope was introduced into the uterine cavity. The cervix and endometrial lining appeared normal both ostia were seen. Findings as noted. The endometrial polyp was resected, and the submucosal fibroid was resected to its base using the myosure. A sharp curettage was then performed to ensure complete sampling of the cavity and was sent to pathology. All instrument, sponge and lap counts were correct x2. The patient was awakened taken to recovery room in stable condition.  Vera LULLA Pa  MD 04/15/2023 8:35 AM

## 2023-04-15 NOTE — Transfer of Care (Signed)
 Immediate Anesthesia Transfer of Care Note  Patient: Jade Sullivan  Procedure(s) Performed: DILATATION & CURETTAGE/HYSTEROSCOPY WITH MYOSURE Myomectomy and endometrial biopsy  Patient Location: PACU  Anesthesia Type:General  Level of Consciousness: awake, alert , and oriented  Airway & Oxygen  Therapy: Patient Spontanous Breathing  Post-op Assessment: Report given to RN and Post -op Vital signs reviewed and stable  Post vital signs: Reviewed and stable  Last Vitals:  Vitals Value Taken Time  BP 139/74 04/15/23 0845  Temp    Pulse 65 04/15/23 0848  Resp 16 04/15/23 0848  SpO2 93 % 04/15/23 0848  Vitals shown include unfiled device data.  Last Pain:  Vitals:   04/15/23 0835  TempSrc:   PainSc: 0-No pain         Complications: No notable events documented.

## 2023-04-15 NOTE — Discharge Instructions (Signed)
     No acetaminophen /Tylenol  until after 12:00 pm today if needed.  No ibuprofen , Advil , Aleve, Motrin , ketorolac , meloxicam, naproxen, or other NSAIDS until after 2:15 pm today if needed.     Post Anesthesia Home Care Instructions  Activity: Get plenty of rest for the remainder of the day. A responsible individual must stay with you for 24 hours following the procedure.  For the next 24 hours, DO NOT: -Drive a car -Advertising copywriter -Drink alcoholic beverages -Take any medication unless instructed by your physician -Make any legal decisions or sign important papers.  Meals: Start with liquid foods such as gelatin or soup. Progress to regular foods as tolerated. Avoid greasy, spicy, heavy foods. If nausea and/or vomiting occur, drink only clear liquids until the nausea and/or vomiting subsides. Call your physician if vomiting continues.  Special Instructions/Symptoms: Your throat may feel dry or sore from the anesthesia or the breathing tube placed in your throat during surgery. If this causes discomfort, gargle with warm salt water. The discomfort should disappear within 24 hours.

## 2023-04-15 NOTE — Interval H&P Note (Signed)
 History and Physical Interval Note:  04/15/2023 7:19 AM  Jade Sullivan  has presented today for surgery, with the diagnosis of Postmenopausal bleeding.  The various methods of treatment have been discussed with the patient and family. After consideration of risks, benefits and other options for treatment, the patient has consented to  Procedure(s): DILATATION & CURETTAGE/HYSTEROSCOPY WITH MYOSURE Myomectomy and endometrial biopsy (N/A) as a surgical intervention.  The patient's history has been reviewed, patient examined, no change in status, stable for surgery.  I have reviewed the patient's chart and labs.  Questions were answered to the patient's satisfaction.     Jade Varnum LULLA Pa

## 2023-04-16 ENCOUNTER — Encounter (HOSPITAL_BASED_OUTPATIENT_CLINIC_OR_DEPARTMENT_OTHER): Payer: Self-pay | Admitting: Obstetrics and Gynecology

## 2023-04-16 LAB — SURGICAL PATHOLOGY

## 2023-04-17 NOTE — Telephone Encounter (Signed)
Ozempic PA approved see other encounter

## 2023-04-20 ENCOUNTER — Other Ambulatory Visit: Payer: Self-pay | Admitting: Internal Medicine

## 2023-04-25 ENCOUNTER — Ambulatory Visit (INDEPENDENT_AMBULATORY_CARE_PROVIDER_SITE_OTHER): Payer: Medicare Other | Admitting: Obstetrics and Gynecology

## 2023-04-25 ENCOUNTER — Encounter: Payer: Self-pay | Admitting: Obstetrics and Gynecology

## 2023-04-25 VITALS — BP 116/76 | HR 70 | Temp 99.0°F | Resp 16

## 2023-04-25 DIAGNOSIS — Z09 Encounter for follow-up examination after completed treatment for conditions other than malignant neoplasm: Secondary | ICD-10-CM

## 2023-04-25 NOTE — Progress Notes (Signed)
78 y.o. G50P1001 female with postmenopausal bleeding, bifascicular block, LVH, T2DM, CKD, stage 3, HTN, nocturnal hypoxia, hypothyroidism, obesity on postop day 10 status post diagnostic hysteroscopy, D&C, myomectomy, polypectomyhere for postoperative exam. Divorced. Son lives in [place].   Pt states she is doing well. Having dark spotting.  Denies N/V, abdominal pain, dysuria. Normal BM and voids.  04/15/2023 pathology: A. ENDOMETRIAL FIBROID, POLYPECTOMY:  Fragments of endometrial polyp with cystic atrophy.  Negative for endometrial intraepithelial neoplasia or malignancy.  Fragments of myometrium with focal changes suggestive of leiomyoma.   B. ENDOMETRIAL, CURETTAGE:  Abundant blood and scant atrophic endometrial glands.  Negative for atypia or malignancy.   GYN HISTORY: No significant history  OB History  Gravida Para Term Preterm AB Living  1 1 1   1   SAB IAB Ectopic Multiple Live Births          # Outcome Date GA Lbr Len/2nd Weight Sex Type Anes PTL Lv  1 Term             Past Medical History:  Diagnosis Date   Allergy    seasonal allergies   Arthritis    bilateral knees   Cataract    not a surgical candidate at this time (05/29/2020)   Chronic kidney disease    COVID-19 04/2019   Diabetes mellitus without complication (HCC)    on meds   Diverticulosis of colon (without mention of hemorrhage)    Dysmetabolic syndrome X    Glaucoma    on meds   Gout    H/O hyperthyroidism    By patient h/o hyperthyroidism with protosis, s/p subtotal thyroidectomy. On no replacement therapy. Last thyroid functions 2013 in [place]  Plan Will need thyroid functions Feb '15 with recommendations to follow.     Heart murmur    hx of   Hyperlipidemia    on meds   Hypertension    on multiple meds   Hypothyroidism    on meds   Kidney lesion    On supplemental oxygen by nasal cannula    at bedtime   Pancreatic cyst    Tubular adenoma of colon     Past Surgical History:   Procedure Laterality Date   BIOPSY  06/27/2020   Procedure: BIOPSY;  Surgeon: Beverley Fiedler, MD;  Location: [facility];  Service: Gastroenterology;;   CESAREAN SECTION  1969   COLONOSCOPY  2016   JMP-MAC-2 day suprep (good)-TICS/TA   COLONOSCOPY WITH PROPOFOL N/A 06/27/2020   Procedure: COLONOSCOPY WITH PROPOFOL;  Surgeon: Beverley Fiedler, MD;  Location: [facility];  Service: Gastroenterology;  Laterality: N/A;   DILATATION & CURETTAGE/HYSTEROSCOPY WITH MYOSURE N/A 04/15/2023   Procedure: DILATATION & CURETTAGE/HYSTEROSCOPY WITH MYOSURE Myomectomy and endometrial biopsy;  Surgeon: Rosalyn Gess, MD;  Location: [facility];  Service: Gynecology;  Laterality: N/A;   EYE SURGERY     LIPOMA EXCISION Right 11/24/2014   Procedure: EXCISION RIGHT SHOULDER LIPOMA;  Surgeon: Harriette Bouillon, MD;  Location: [facility];  Service: General;  Laterality: Right;   POLYPECTOMY  2016   TA   POLYPECTOMY  06/27/2020   Procedure: POLYPECTOMY;  Surgeon: Beverley Fiedler, MD;  Location: [facility];  Service: Gastroenterology;;   THYROIDECTOMY, PARTIAL  1981   TONSILLECTOMY     UMBILICAL HERNIA REPAIR  2000   WISDOM TOOTH EXTRACTION      Current Outpatient Medications on File Prior to Visit  Medication Sig Dispense Refill   aspirin EC 81 MG  tablet Take 81 mg by mouth every evening. Swallow whole.     aspirin-acetaminophen-caffeine (EXCEDRIN MIGRAINE) 250-250-65 MG tablet Take 1-2 tablets by mouth every 6 (six) hours as needed for headache (sinus headaches/pain).     atorvastatin (LIPITOR) 40 MG tablet Take 1 tablet (40 mg total) by mouth daily. 90 tablet 3   cholecalciferol (VITAMIN D3) 25 MCG (1000 UT) tablet Take 1,000 Units by mouth daily.     cloNIDine (CATAPRES - DOSED IN MG/24 HR) 0.3 mg/24hr patch 0.3 mg once a week.     cloNIDine (CATAPRES) 0.2 MG tablet Take 1 tablet (0.2 mg total) by mouth 3 (three) times daily.     fish oil-omega-3 fatty acids 1000 MG capsule Take 2 g  by mouth daily.     gabapentin (NEURONTIN) 100 MG capsule Take 2 capsules (200 mg total) by mouth at bedtime. 180 capsule 0   ibuprofen (ADVIL) 600 MG tablet Take 1 tablet (600 mg total) by mouth every 8 (eight) hours as needed. 30 tablet 0   irbesartan (AVAPRO) 300 MG tablet TAKE 1 TABLET(300 MG) BY MOUTH IN THE MORNING 90 tablet 0   latanoprost (XALATAN) 0.005 % ophthalmic solution Place 1 drop into both eyes at bedtime.      levothyroxine (SYNTHROID) 75 MCG tablet Take 75 mcg by mouth daily before breakfast.     metformin (FORTAMET) 500 MG (OSM) 24 hr tablet Take 500 mg by mouth daily with breakfast.     minoxidil (LONITEN) 2.5 MG tablet Take 5 mg by mouth in the morning, at noon, and at bedtime.     Multiple Vitamin (MULTIVITAMIN WITH MINERALS) TABS tablet Take 1 tablet by mouth every evening.     nebivolol (BYSTOLIC) 5 MG tablet Take 5 mg by mouth in the morning.     REPATHA SURECLICK 140 MG/ML SOAJ ADMINISTER 1 ML UNDER THE SKIN EVERY 14 DAYS 2 mL 11   Semaglutide,0.25 or 0.5MG /DOS, 2 MG/3ML SOPN Inject 0.25 mg into the skin once a week. 3 mL 0   spironolactone (ALDACTONE) 100 MG tablet Take 100 mg by mouth every evening.     timolol (TIMOPTIC) 0.5 % ophthalmic solution Place 1 drop into both eyes 2 (two) times daily.     vitamin B-12 (CYANOCOBALAMIN) 500 MCG tablet Take 1,000 mcg by mouth every evening.     ALPRAZolam (XANAX) 0.25 MG tablet Take 1 tablet (0.25 mg total) by mouth 2 (two) times daily as needed for anxiety (prior to dentist). (Patient not taking: Reported on 04/25/2023) 20 tablet 0   LORazepam (ATIVAN) 1 MG tablet Take 1 mg by mouth once. (Patient not taking: Reported on 04/25/2023)     No current facility-administered medications on file prior to visit.    Allergies  Allergen Reactions   Amlodipine     Gingival hyperplasia   Hctz [Hydrochlorothiazide] Other (See Comments)    gout   Jardiance [Empagliflozin] Other (See Comments)    Yeast Infections      PE Today's  Vitals   04/25/23 1033  BP: 116/76  Pulse: 70  Resp: 16  Temp: 99 F (37.2 C)  TempSrc: Oral    There is no height or weight on file to calculate BMI.  Physical Exam Vitals reviewed.  Constitutional:      General: She is not in acute distress.    Appearance: Normal appearance.  HENT:     Head: Normocephalic and atraumatic.     Nose: Nose normal.  Eyes:     Extraocular  Movements: Extraocular movements intact.     Conjunctiva/sclera: Conjunctivae normal.  Pulmonary:     Effort: Pulmonary effort is normal.  Musculoskeletal:        General: Normal range of motion.     Cervical back: Normal range of motion.  Neurological:     General: No focal deficit present.     Mental Status: She is alert.  Psychiatric:        Mood and Affect: Mood normal.        Behavior: Behavior normal.     Assessment and Plan:        Postoperative examination  Normal postoperative exam.  Patient cleared to return to normal activities. Spotting should stop within 2 weeks of surgery.  To call with continued symptoms. All questions answered.  Return to office for annual in 2026.  Rosalyn Gess, MD

## 2023-04-30 ENCOUNTER — Telehealth (HOSPITAL_COMMUNITY): Payer: Self-pay | Admitting: *Deleted

## 2023-04-30 NOTE — Telephone Encounter (Signed)
Reaching out to patient to offer assistance regarding upcoming cardiac imaging study; pt verbalizes understanding of appt date/time, parking situation and where to check in, pre-test NPO status and medications ordered, and verified current allergies; name and call back number provided for further questions should they arise Johney Frame RN Navigator Cardiac Imaging [facility] [phone] office [phone] cell  Patient denies metal, reports claustrophobia.  Patient will take 1mg  ativan prior to MRI and aware to have a driver.   Patient verbalized understanding.

## 2023-05-01 ENCOUNTER — Other Ambulatory Visit: Payer: Self-pay | Admitting: Internal Medicine

## 2023-05-01 ENCOUNTER — Ambulatory Visit (HOSPITAL_COMMUNITY)
Admission: RE | Admit: 2023-05-01 | Discharge: 2023-05-01 | Disposition: A | Discharge status: Home / Self Care | Payer: Medicare Other | Source: Ambulatory Visit | Attending: Internal Medicine | Admitting: Internal Medicine

## 2023-05-01 DIAGNOSIS — E7841 Elevated Lipoprotein(a): Secondary | ICD-10-CM | POA: Insufficient documentation

## 2023-05-01 DIAGNOSIS — E119 Type 2 diabetes mellitus without complications: Secondary | ICD-10-CM

## 2023-05-01 DIAGNOSIS — E1159 Type 2 diabetes mellitus with other circulatory complications: Secondary | ICD-10-CM | POA: Diagnosis not present

## 2023-05-01 DIAGNOSIS — E1169 Type 2 diabetes mellitus with other specified complication: Secondary | ICD-10-CM

## 2023-05-01 DIAGNOSIS — Z6833 Body mass index (BMI) 33.0-33.9, adult: Secondary | ICD-10-CM

## 2023-05-01 DIAGNOSIS — E785 Hyperlipidemia, unspecified: Secondary | ICD-10-CM | POA: Insufficient documentation

## 2023-05-01 DIAGNOSIS — I452 Bifascicular block: Secondary | ICD-10-CM | POA: Insufficient documentation

## 2023-05-01 DIAGNOSIS — I517 Cardiomegaly: Secondary | ICD-10-CM

## 2023-05-01 DIAGNOSIS — I700 Atherosclerosis of aorta: Secondary | ICD-10-CM | POA: Insufficient documentation

## 2023-05-01 DIAGNOSIS — I152 Hypertension secondary to endocrine disorders: Secondary | ICD-10-CM | POA: Diagnosis not present

## 2023-05-01 DIAGNOSIS — N1831 Chronic kidney disease, stage 3a: Secondary | ICD-10-CM | POA: Insufficient documentation

## 2023-05-01 MED ORDER — GADOBUTROL 1 MMOL/ML IV SOLN
10.0000 mL | Freq: Once | INTRAVENOUS | Status: AC | PRN
  Administered 2023-05-01: 10 mL via INTRAVENOUS

## 2023-05-09 ENCOUNTER — Ambulatory Visit: Payer: Medicare Other | Admitting: Podiatry

## 2023-05-09 ENCOUNTER — Encounter: Payer: Self-pay | Admitting: Podiatry

## 2023-05-09 DIAGNOSIS — Q828 Other specified congenital malformations of skin: Secondary | ICD-10-CM

## 2023-05-09 DIAGNOSIS — M79675 Pain in left toe(s): Secondary | ICD-10-CM

## 2023-05-09 DIAGNOSIS — N1831 Chronic kidney disease, stage 3a: Secondary | ICD-10-CM | POA: Diagnosis not present

## 2023-05-09 DIAGNOSIS — E1122 Type 2 diabetes mellitus with diabetic chronic kidney disease: Secondary | ICD-10-CM

## 2023-05-09 DIAGNOSIS — M79674 Pain in right toe(s): Secondary | ICD-10-CM | POA: Diagnosis not present

## 2023-05-09 DIAGNOSIS — B351 Tinea unguium: Secondary | ICD-10-CM | POA: Diagnosis not present

## 2023-05-09 NOTE — Progress Notes (Signed)
 This patient returns to my office for at risk foot care.  This patient requires this care by a professional since this patient will be at risk due to having diabetes and CKD.  This patient is unable to cut nails herself since the patient cannot reach hernails.These nails are painful walking and wearing shoes.  She also has a painful callus left forefoot.This patient presents for at risk foot care today.  General Appearance  Alert, conversant and in no acute stress.  Vascular  Dorsalis pedis and posterior tibial  pulses are palpable  bilaterally.  Capillary return is within normal limits  bilaterally. Temperature is within normal limits  bilaterally.  Neurologic  Senn-Weinstein monofilament wire test within normal limits  bilaterally. Muscle power within normal limits bilaterally.  Nails Thick disfigured discolored nails with subungual debris  from hallux to fifth toes bilaterally. No evidence of bacterial infection or drainage bilaterally.  Orthopedic  No limitations of motion  feet .  No crepitus or effusions noted.  No bony pathology or digital deformities noted.  Skin  normotropic skin noted bilaterally.  No signs of infections or ulcers noted.   Porokeratosis sub 3 left foot   Onychomycosis  Pain in right toes  Pain in left toes  Consent was obtained for treatment procedures.   Mechanical debridement of nails 1-5  bilaterally performed with a nail nipper.  Filed with dremel without incident.  Debride porokeratosis with # 15 blade and dremel tool.   Return office visit   3 months                   Told patient to return for periodic foot care and evaluation due to potential at risk complications.   [name] DPM

## 2023-05-12 ENCOUNTER — Telehealth: Payer: Self-pay | Admitting: Pharmacist

## 2023-05-12 MED ORDER — SEMAGLUTIDE(0.25 OR 0.5MG/DOS) 2 MG/3ML ~~LOC~~ SOPN: 0.5000 mg | PEN_INJECTOR | SUBCUTANEOUS | 0 refills | Status: DC

## 2023-05-12 NOTE — Telephone Encounter (Signed)
 Tolerates Ozempic  0.25 mg dose well, will up the dose to 0.5 mg. F/u in 6 weeks for further dose titration.

## 2023-05-13 NOTE — Progress Notes (Deleted)
 [name] [address] [place] [address] Phone: [phone] Subjective:    I'm seeing this patient by the request  of:  Jarrett Soho, PA-C  CC:   Jade Sullivan:[id number]  04/10/2023 Repeat steroid injections given today, tolerated the procedure well. Could be a candidate for the viscosupplementation again. Due to patient having a procedure did not want to do the full dose. Did 1/2 cc of Kenalog in each knee. Encourage patient to monitor her blood sugar closely before her procedure. Follow-up with me again in 8 to 10 weeks.   Update 05/22/2023 KAMYAH WILHELMSEN is a 78 y.o. female coming in with complaint of B knee pain. Patient states        Past Medical History:  Diagnosis Date   Allergy    seasonal allergies   Arthritis    bilateral knees   Cataract    not a surgical candidate at this time (05/29/2020)   Chronic kidney disease    COVID-19 04/2019   Diabetes mellitus without complication (HCC)    on meds   Diverticulosis of colon (without mention of hemorrhage)    Dysmetabolic syndrome X    Glaucoma    on meds   Gout    H/O hyperthyroidism    By patient h/o hyperthyroidism with protosis, s/p subtotal thyroidectomy. On no replacement therapy. Last thyroid functions 2013 in [place]  Plan Will need thyroid functions Feb '15 with recommendations to follow.     Heart murmur    hx of   Hyperlipidemia    on meds   Hypertension    on multiple meds   Hypothyroidism    on meds   Kidney lesion    On supplemental oxygen by nasal cannula    at bedtime   Pancreatic cyst    Tubular adenoma of colon    Past Surgical History:  Procedure Laterality Date   BIOPSY  06/27/2020   Procedure: BIOPSY;  Surgeon: Beverley Fiedler, MD;  Location: [facility];  Service: Gastroenterology;;   CESAREAN SECTION  1969   COLONOSCOPY  2016   JMP-MAC-2 day suprep (good)-TICS/TA   COLONOSCOPY WITH PROPOFOL N/A 06/27/2020   Procedure: COLONOSCOPY WITH PROPOFOL;   Surgeon: Beverley Fiedler, MD;  Location: [facility];  Service: Gastroenterology;  Laterality: N/A;   DILATATION & CURETTAGE/HYSTEROSCOPY WITH MYOSURE N/A 04/15/2023   Procedure: DILATATION & CURETTAGE/HYSTEROSCOPY WITH MYOSURE Myomectomy and endometrial biopsy;  Surgeon: Rosalyn Gess, MD;  Location: [facility];  Service: Gynecology;  Laterality: N/A;   EYE SURGERY     LIPOMA EXCISION Right 11/24/2014   Procedure: EXCISION RIGHT SHOULDER LIPOMA;  Surgeon: Harriette Bouillon, MD;  Location: [facility];  Service: General;  Laterality: Right;   POLYPECTOMY  2016   TA   POLYPECTOMY  06/27/2020   Procedure: POLYPECTOMY;  Surgeon: Beverley Fiedler, MD;  Location: [facility];  Service: Gastroenterology;;   THYROIDECTOMY, PARTIAL  1981   TONSILLECTOMY     UMBILICAL HERNIA REPAIR  2000   WISDOM TOOTH EXTRACTION     Social History   Socioeconomic History   Marital status: Divorced    Spouse name: Not on file   Number of children: 1   Years of education: Not on file   Highest education level: Not on file  Occupational History   Occupation: Retired  Tobacco Use   Smoking status: Former   Smokeless tobacco: Never  Vaping Use   Vaping status: Never Used  Substance and  Sexual Activity   Alcohol use: No    Alcohol/week: 0.0 standard drinks of alcohol   Drug use: No   Sexual activity: Not Currently    Partners: Male    Birth control/protection: Post-menopausal    Comment: 1st intercourse- 11, partners- 5, divorced  Other Topics Concern   Not on file  Social History Narrative   Divorced   Retired - Engineer, petroleum   Social Drivers of Corporate investment banker Strain: Low Risk  (10/10/2021)   Overall Financial Resource Strain (CARDIA)    Difficulty of Paying Living Expenses: Not hard at all  Food Insecurity: No Food Insecurity (12/20/2021)   Hunger Vital Sign    Worried About Running Out of Food in the Last Year: Never true    Ran Out of Food in the Last  Year: Never true  Transportation Needs: No Transportation Needs (12/20/2021)   PRAPARE - Administrator, Civil Service (Medical): No    Lack of Transportation (Non-Medical): No  Physical Activity: Sufficiently Active (10/10/2021)   Exercise Vital Sign    Days of Exercise per Week: 5 days    Minutes of Exercise per Session: 30 min  Stress: No Stress Concern Present (10/10/2021)   [employer] of Occupational Health - Occupational Stress Questionnaire    Feeling of Stress : Not at all  Social Connections: Moderately Integrated (10/10/2021)   Social Connection and Isolation Panel [NHANES]    Frequency of Communication with Friends and Family: More than three times a week    Frequency of Social Gatherings with Friends and Family: Twice a week    Attends Religious Services: More than 4 times per year    Active Member of [employer] or Organizations: Yes    Attends Engineer, structural: More than 4 times per year    Marital Status: Divorced   Allergies  Allergen Reactions   Amlodipine     Gingival hyperplasia   Hctz [Hydrochlorothiazide] Other (See Comments)    gout   Jardiance [Empagliflozin] Other (See Comments)    Yeast Infections   Family History  Problem Relation Age of Onset   Diabetes Mother        DM2   Hypertension Mother    Pneumonia Mother        died from this condition   Colon polyps Mother    Heart disease Father        died from this condition   Colon polyps Father    Kidney disease Sister        HBP and DM2; relative died from this condition   Colon polyps Sister    Heart disease Brother        MI - HBP and DM2   Colon polyps Brother    Colon polyps Brother     Current Outpatient Medications (Endocrine & Metabolic):    levothyroxine (SYNTHROID) 75 MCG tablet, Take 75 mcg by mouth daily before breakfast.   metformin (FORTAMET) 500 MG (OSM) 24 hr tablet, Take 500 mg by mouth daily with breakfast.   Semaglutide,0.25 or 0.5MG /DOS, 2 MG/3ML  SOPN, Inject 0.5 mg into the skin once a week.  Current Outpatient Medications (Cardiovascular):    atorvastatin (LIPITOR) 40 MG tablet, Take 1 tablet (40 mg total) by mouth daily.   cloNIDine (CATAPRES - DOSED IN MG/24 HR) 0.3 mg/24hr patch, 0.3 mg once a week.   cloNIDine (CATAPRES) 0.2 MG tablet, Take 1 tablet (0.2 mg total) by mouth 3 (three) times  daily.   irbesartan (AVAPRO) 300 MG tablet, TAKE 1 TABLET(300 MG) BY MOUTH IN THE MORNING   minoxidil (LONITEN) 2.5 MG tablet, Take 5 mg by mouth in the morning, at noon, and at bedtime.   nebivolol (BYSTOLIC) 5 MG tablet, Take 5 mg by mouth in the morning.   REPATHA SURECLICK 140 MG/ML SOAJ, ADMINISTER 1 ML UNDER THE SKIN EVERY 14 DAYS   spironolactone (ALDACTONE) 100 MG tablet, Take 100 mg by mouth every evening.   Current Outpatient Medications (Analgesics):    aspirin EC 81 MG tablet, Take 81 mg by mouth every evening. Swallow whole.   aspirin-acetaminophen-caffeine (EXCEDRIN MIGRAINE) 250-250-65 MG tablet, Take 1-2 tablets by mouth every 6 (six) hours as needed for headache (sinus headaches/pain).   ibuprofen (ADVIL) 600 MG tablet, Take 1 tablet (600 mg total) by mouth every 8 (eight) hours as needed.  Current Outpatient Medications (Hematological):    vitamin B-12 (CYANOCOBALAMIN) 500 MCG tablet, Take 1,000 mcg by mouth every evening.  Current Outpatient Medications (Other):    ALPRAZolam (XANAX) 0.25 MG tablet, Take 1 tablet (0.25 mg total) by mouth 2 (two) times daily as needed for anxiety (prior to dentist).   cholecalciferol (VITAMIN D3) 25 MCG (1000 UT) tablet, Take 1,000 Units by mouth daily.   fish oil-omega-3 fatty acids 1000 MG capsule, Take 2 g by mouth daily.   gabapentin (NEURONTIN) 100 MG capsule, Take 2 capsules (200 mg total) by mouth at bedtime.   latanoprost (XALATAN) 0.005 % ophthalmic solution, Place 1 drop into both eyes at bedtime.    LORazepam (ATIVAN) 1 MG tablet, Take 1 mg by mouth once.   Multiple Vitamin  (MULTIVITAMIN WITH MINERALS) TABS tablet, Take 1 tablet by mouth every evening.   timolol (TIMOPTIC) 0.5 % ophthalmic solution, Place 1 drop into both eyes 2 (two) times daily.   Reviewed prior external information including notes and imaging from  primary care provider As well as notes that were available from care everywhere and other healthcare systems.  Past medical history, social, surgical and family history all reviewed in electronic medical record.  No pertanent information unless stated regarding to the chief complaint.   Review of Systems:  No headache, visual changes, nausea, vomiting, diarrhea, constipation, dizziness, abdominal pain, skin rash, fevers, chills, night sweats, weight loss, swollen lymph nodes, body aches, joint swelling, chest pain, shortness of breath, mood changes. POSITIVE muscle aches  Objective  There were no vitals taken for this visit.   General: No apparent distress alert and oriented x3 mood and affect normal, dressed appropriately.  HEENT: Pupils equal, extraocular movements intact  Respiratory: Patient's speak in full sentences and does not appear short of breath  Cardiovascular: No lower extremity edema, non tender, no erythema      Impression and Recommendations:

## 2023-05-13 NOTE — Progress Notes (Signed)
Pt returned call.  Scheduled her to review results with Dr. Lynnette Caffey on 05/28/23.

## 2023-05-22 ENCOUNTER — Ambulatory Visit: Payer: Medicare Other | Admitting: Family Medicine

## 2023-05-23 NOTE — H&P (View-Only) (Signed)
 Cardiology Office Note:   Date:  05/28/2023  ID:  Jade Sullivan, Jade Sullivan Aug 27, 1945, MRN [id number] PCP:  Jarrett Soho, PA-C  [facility] Providers Cardiologist:  Alverda Skeans, MD Referring MD: Jarrett Soho, PA-C  Chief Complaint/Reason for Referral: Cardiology follow-up ASSESSMENT:    1. Idiopathic dilatation of pulmonary artery (HCC)   2. Pericardial effusion   3. Type 2 diabetes mellitus without complication, without long-term current use of insulin (HCC)   4. Hypertension associated with diabetes (HCC)   5. Hyperlipidemia associated with type 2 diabetes mellitus (HCC)   6. Elevated lipoprotein(a)   7. Aortic atherosclerosis (HCC)   8. CKD stage 3 due to type 2 diabetes mellitus (HCC)   9. BMI 33.0-33.9,adult   10. Pre-procedure lab exam      PLAN:   In order of problems listed above: PA dilatation: In conjunction with pericardial effusion would suggest pulmonary hypertension.  Will refer for right heart catheterization to evaluate further. Pericardial effusion: Will need to rule out severe pulmonary arterial hypertension first.  If negative will then refer for pericardiocentesis for diagnostic purposes.  If negative I will check a CRP and consider empiric therapy for pericarditis. Type 2 diabetes mellitus: Continue aspirin 81 mg daily, Lipitor 40 mg daily, Avapro 300 milligrams daily, defer SGLT2 inhibitor due to intolerance, continue Ozempic. Hypertension:   Patient's blood pressure is above goal.  She just took her blood pressure medications will continue to monitor for now. Hyperlipidemia: Lipid panel at goal recently elevated LP(a): On Repatha; has had an excellent response.  Appreciate Dr. Blanchie Dessert management. Elevated LP(a): Continue Repatha.   Aortic atherosclerosis: Continue aspirin 81 mg daily, Lipitor 40 mg daily, and strict blood pressure control.   CKD stage III: Continue Avapro; unable to tolerate SGLT2 inhibitor. Elevated BMI: Continue Ozempic               Dispo:  Return in about 3 months (around 08/25/2023).      Medication Adjustments/Labs and Tests Ordered: Current medicines are reviewed at length with the patient today.  Concerns regarding medicines are outlined above.  The following changes have been made:  no change   Labs/tests ordered: Orders Placed This Encounter  Procedures   CBC   Basic metabolic panel   EKG 12-Lead    Medication Changes: No orders of the defined types were placed in this encounter.   Current medicines are reviewed at length with the patient today.  The patient does not have concerns regarding medicines.  I spent 33 minutes reviewing all clinical data during and prior to this visit including all relevant imaging studies, laboratories, clinical information from other health systems and prior notes from both Cardiology and other specialties, interviewing the patient, conducting a complete physical examination, and coordinating care in order to formulate a comprehensive and personalized evaluation and treatment plan.  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  At that appointment  the patient was doing very well however her blood pressure was quite elevated.  She was started on atorvastatin 40 mg, Jardiance 10 mg and given a blood pressure of 180/98 referred for blood  pressure check in 1 month.   June 2023: In the interim the patient's blood pressure has been checked multiple times by multiple providers and been in the 130s over 80s.  She was seen by internal medicine in February and her blood pressure was 130/74.  Plan: Increase atorvastatin to 80 mg and check lipid panel, LFTs, and LP(a) in 2 months.  December 2024: In the interim she was seen by general cardiology in June of this year and was doing well.  No medication changes were made.  She had also been seen by Dr. Rennis Golden in started on Repatha with significant decrease in her LP(a).  The patient is doing well.  She denies any chest pain or significant shortness of breath.  She is required no emergency room visits or hospitalizations.  She tells me her blood pressures at home are really very well-controlled usually in the 120s or 130s systolic.  This also is the case when she goes to other doctors.  She is otherwise well and tolerating her medical therapy without any issues.  Plan: Check lipid panel, LFTs, referred to pharmacy for GLP-1 receptor agonist therapy, and referral for cardiac MRI given bifascicular block and left ventricular hypertrophy.  2/24: Patient consents to use of AI scribe.  The patient's LDL in the interim was 8.  The patient presents with concerns regarding findings from an MRI scan showing fluid collection around the heart and possible pulmonary hypertension.  No severe shortness of breath, chest pain, chest fullness, or difficulty breathing when lying flat. No lightheadedness or syncope. Previously experienced leg swelling, which has resolved.  History of high cholesterol initially detected on a CT scan, leading to further cardiac investigations. An ultrasound of the heart showed thickening of the heart muscle, and an abnormal EKG prompted the MRI.  Blood pressure readings have been variable, with a recent reading of 148/88 mmHg at another physician's office. Home readings have been  lower, with one as low as 99/60 mmHg.  Recently received injections in the knees and neck for arthritis-related pain, which sometimes wakes him at night. An x-ray was performed to investigate the neck pain, and further evaluation is pending.          Current Medications: Current Meds  Medication Sig   ALPRAZolam (XANAX) 0.25 MG tablet Take 1 tablet (0.25 mg total) by mouth 2 (two) times daily as needed for anxiety (prior to dentist).   aspirin EC 81 MG tablet Take 81 mg by mouth every evening. Swallow whole.   aspirin-acetaminophen-caffeine (EXCEDRIN MIGRAINE) 250-250-65 MG tablet Take 1-2 tablets by mouth every 6 (six) hours as needed for headache (sinus headaches/pain).   atorvastatin (LIPITOR) 40 MG tablet Take 1 tablet (40 mg total) by mouth daily.   cholecalciferol (VITAMIN D3) 25 MCG (1000 UT) tablet Take 1,000 Units by mouth daily.   cloNIDine (CATAPRES - DOSED IN MG/24 HR) 0.3 mg/24hr patch 0.3 mg once a week.   cloNIDine (CATAPRES) 0.2 MG tablet Take 1 tablet (0.2 mg total) by mouth 3 (three) times daily.   fish oil-omega-3 fatty acids 1000 MG capsule Take 2 g by mouth daily.   gabapentin (NEURONTIN) 100 MG capsule Take 2 capsules (200 mg total) by mouth at bedtime.   ibuprofen (ADVIL) 600 MG tablet Take 1 tablet (600 mg total)  by mouth every 8 (eight) hours as needed.   irbesartan (AVAPRO) 300 MG tablet TAKE 1 TABLET(300 MG) BY MOUTH IN THE MORNING   latanoprost (XALATAN) 0.005 % ophthalmic solution Place 1 drop into both eyes at bedtime.    levothyroxine (SYNTHROID) 75 MCG tablet Take 75 mcg by mouth daily before breakfast.   LORazepam (ATIVAN) 1 MG tablet Take 1 mg by mouth once.   metFORMIN (GLUCOPHAGE) 500 MG tablet Take 500 mg by mouth daily.   minoxidil (LONITEN) 2.5 MG tablet Take 5 mg by mouth in the morning, at noon, and at bedtime.   Multiple Vitamin (MULTIVITAMIN WITH MINERALS) TABS tablet Take 1 tablet by mouth every evening.   nebivolol (BYSTOLIC) 5 MG tablet Take 5  mg by mouth in the morning.   REPATHA SURECLICK 140 MG/ML SOAJ ADMINISTER 1 ML UNDER THE SKIN EVERY 14 DAYS   Semaglutide,0.25 or 0.5MG /DOS, 2 MG/3ML SOPN Inject 0.5 mg into the skin once a week.   spironolactone (ALDACTONE) 100 MG tablet Take 100 mg by mouth every evening.   timolol (TIMOPTIC) 0.5 % ophthalmic solution Place 1 drop into both eyes 2 (two) times daily.   vitamin B-12 (CYANOCOBALAMIN) 500 MCG tablet Take 1,000 mcg by mouth every evening.     Review of Systems:   Please see the history of present illness.    All other systems reviewed and are negative.     EKGs/Labs/Other Test Reviewed:   EKG: EKG from June 2024 demonstrates a first-degree AV block and left anterior fascicular block  EKG Interpretation Date/Time:  Wednesday May 28 2023 10:46:30 EST Ventricular Rate:  91 PR Interval:  290 QRS Duration:  84 QT Interval:  336 QTC Calculation: 413 R Axis:   -61  Text Interpretation: Sinus rhythm with 1st degree A-V block Left anterior fascicular block Nonspecific ST and T wave abnormality When compared with ECG of 25-Sep-2022 08:18, Vent. rate has increased BY  31 BPM Confirmed by Alverda Skeans ([id number]) on 05/28/2023 10:53:27 AM         Risk Assessment/Calculations:          Physical Exam:   VS:  BP (!) 150/80   Pulse 94   Ht 5\' 5"  (1.651 m)   Wt 183 lb 9.6 oz (83.3 kg)   SpO2 99%   BMI 30.55 kg/m    HYPERTENSION CONTROL Vitals:   05/28/23 1039 05/28/23 1102  BP: (!) 156/90 (!) 150/80    The patient's blood pressure is elevated above target today.  In order to address the patient's elevated BP: A current anti-hypertensive medication was adjusted today.      Wt Readings from Last 3 Encounters:  05/28/23 183 lb 9.6 oz (83.3 kg)  05/27/23 188 lb (85.3 kg)  04/15/23 190 lb (86.2 kg)      GENERAL:  No apparent distress, AOx3 HEENT:  No carotid bruits, +2 carotid impulses, no scleral icterus CAR: RRR no murmurs, gallops, rubs, or thrills RES:   Clear to auscultation bilaterally ABD:  Soft, nontender, nondistended, positive bowel sounds x 4 VASC:  +2 radial pulses, +2 carotid pulses NEURO:  CN 2-12 grossly intact; motor and sensory grossly intact PSYCH:  No active depression or anxiety EXT:  No edema, ecchymosis, or cyanosis  Signed, Orbie Pyo, MD  05/28/2023 11:16 AM    [facility] [address] [place], [place], [place]  [address] Phone: [phone]; Fax: [phone]   Note:  This document was prepared using Dragon voice  recognition software and may include unintentional dictation errors.

## 2023-05-23 NOTE — Progress Notes (Signed)
 Cardiology Office Note:   Date:  05/28/2023  ID:  Jade Sullivan, Jade Sullivan Aug 27, 1945, MRN [id number] PCP:  Jarrett Soho, PA-C  [facility] Providers Cardiologist:  Alverda Skeans, MD Referring MD: Jarrett Soho, PA-C  Chief Complaint/Reason for Referral: Cardiology follow-up ASSESSMENT:    1. Idiopathic dilatation of pulmonary artery (HCC)   2. Pericardial effusion   3. Type 2 diabetes mellitus without complication, without long-term current use of insulin (HCC)   4. Hypertension associated with diabetes (HCC)   5. Hyperlipidemia associated with type 2 diabetes mellitus (HCC)   6. Elevated lipoprotein(a)   7. Aortic atherosclerosis (HCC)   8. CKD stage 3 due to type 2 diabetes mellitus (HCC)   9. BMI 33.0-33.9,adult   10. Pre-procedure lab exam      PLAN:   In order of problems listed above: PA dilatation: In conjunction with pericardial effusion would suggest pulmonary hypertension.  Will refer for right heart catheterization to evaluate further. Pericardial effusion: Will need to rule out severe pulmonary arterial hypertension first.  If negative will then refer for pericardiocentesis for diagnostic purposes.  If negative I will check a CRP and consider empiric therapy for pericarditis. Type 2 diabetes mellitus: Continue aspirin 81 mg daily, Lipitor 40 mg daily, Avapro 300 milligrams daily, defer SGLT2 inhibitor due to intolerance, continue Ozempic. Hypertension:   Patient's blood pressure is above goal.  She just took her blood pressure medications will continue to monitor for now. Hyperlipidemia: Lipid panel at goal recently elevated LP(a): On Repatha; has had an excellent response.  Appreciate Dr. Blanchie Dessert management. Elevated LP(a): Continue Repatha.   Aortic atherosclerosis: Continue aspirin 81 mg daily, Lipitor 40 mg daily, and strict blood pressure control.   CKD stage III: Continue Avapro; unable to tolerate SGLT2 inhibitor. Elevated BMI: Continue Ozempic               Dispo:  Return in about 3 months (around 08/25/2023).      Medication Adjustments/Labs and Tests Ordered: Current medicines are reviewed at length with the patient today.  Concerns regarding medicines are outlined above.  The following changes have been made:  no change   Labs/tests ordered: Orders Placed This Encounter  Procedures   CBC   Basic metabolic panel   EKG 12-Lead    Medication Changes: No orders of the defined types were placed in this encounter.   Current medicines are reviewed at length with the patient today.  The patient does not have concerns regarding medicines.  I spent 33 minutes reviewing all clinical data during and prior to this visit including all relevant imaging studies, laboratories, clinical information from other health systems and prior notes from both Cardiology and other specialties, interviewing the patient, conducting a complete physical examination, and coordinating care in order to formulate a comprehensive and personalized evaluation and treatment plan.  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  At that appointment  the patient was doing very well however her blood pressure was quite elevated.  She was started on atorvastatin 40 mg, Jardiance 10 mg and given a blood pressure of 180/98 referred for blood  pressure check in 1 month.   June 2023: In the interim the patient's blood pressure has been checked multiple times by multiple providers and been in the 130s over 80s.  She was seen by internal medicine in February and her blood pressure was 130/74.  Plan: Increase atorvastatin to 80 mg and check lipid panel, LFTs, and LP(a) in 2 months.  December 2024: In the interim she was seen by general cardiology in June of this year and was doing well.  No medication changes were made.  She had also been seen by Dr. Rennis Golden in started on Repatha with significant decrease in her LP(a).  The patient is doing well.  She denies any chest pain or significant shortness of breath.  She is required no emergency room visits or hospitalizations.  She tells me her blood pressures at home are really very well-controlled usually in the 120s or 130s systolic.  This also is the case when she goes to other doctors.  She is otherwise well and tolerating her medical therapy without any issues.  Plan: Check lipid panel, LFTs, referred to pharmacy for GLP-1 receptor agonist therapy, and referral for cardiac MRI given bifascicular block and left ventricular hypertrophy.  2/24: Patient consents to use of AI scribe.  The patient's LDL in the interim was 8.  The patient presents with concerns regarding findings from an MRI scan showing fluid collection around the heart and possible pulmonary hypertension.  No severe shortness of breath, chest pain, chest fullness, or difficulty breathing when lying flat. No lightheadedness or syncope. Previously experienced leg swelling, which has resolved.  History of high cholesterol initially detected on a CT scan, leading to further cardiac investigations. An ultrasound of the heart showed thickening of the heart muscle, and an abnormal EKG prompted the MRI.  Blood pressure readings have been variable, with a recent reading of 148/88 mmHg at another physician's office. Home readings have been  lower, with one as low as 99/60 mmHg.  Recently received injections in the knees and neck for arthritis-related pain, which sometimes wakes him at night. An x-ray was performed to investigate the neck pain, and further evaluation is pending.          Current Medications: Current Meds  Medication Sig   ALPRAZolam (XANAX) 0.25 MG tablet Take 1 tablet (0.25 mg total) by mouth 2 (two) times daily as needed for anxiety (prior to dentist).   aspirin EC 81 MG tablet Take 81 mg by mouth every evening. Swallow whole.   aspirin-acetaminophen-caffeine (EXCEDRIN MIGRAINE) 250-250-65 MG tablet Take 1-2 tablets by mouth every 6 (six) hours as needed for headache (sinus headaches/pain).   atorvastatin (LIPITOR) 40 MG tablet Take 1 tablet (40 mg total) by mouth daily.   cholecalciferol (VITAMIN D3) 25 MCG (1000 UT) tablet Take 1,000 Units by mouth daily.   cloNIDine (CATAPRES - DOSED IN MG/24 HR) 0.3 mg/24hr patch 0.3 mg once a week.   cloNIDine (CATAPRES) 0.2 MG tablet Take 1 tablet (0.2 mg total) by mouth 3 (three) times daily.   fish oil-omega-3 fatty acids 1000 MG capsule Take 2 g by mouth daily.   gabapentin (NEURONTIN) 100 MG capsule Take 2 capsules (200 mg total) by mouth at bedtime.   ibuprofen (ADVIL) 600 MG tablet Take 1 tablet (600 mg total)  by mouth every 8 (eight) hours as needed.   irbesartan (AVAPRO) 300 MG tablet TAKE 1 TABLET(300 MG) BY MOUTH IN THE MORNING   latanoprost (XALATAN) 0.005 % ophthalmic solution Place 1 drop into both eyes at bedtime.    levothyroxine (SYNTHROID) 75 MCG tablet Take 75 mcg by mouth daily before breakfast.   LORazepam (ATIVAN) 1 MG tablet Take 1 mg by mouth once.   metFORMIN (GLUCOPHAGE) 500 MG tablet Take 500 mg by mouth daily.   minoxidil (LONITEN) 2.5 MG tablet Take 5 mg by mouth in the morning, at noon, and at bedtime.   Multiple Vitamin (MULTIVITAMIN WITH MINERALS) TABS tablet Take 1 tablet by mouth every evening.   nebivolol (BYSTOLIC) 5 MG tablet Take 5  mg by mouth in the morning.   REPATHA SURECLICK 140 MG/ML SOAJ ADMINISTER 1 ML UNDER THE SKIN EVERY 14 DAYS   Semaglutide,0.25 or 0.5MG /DOS, 2 MG/3ML SOPN Inject 0.5 mg into the skin once a week.   spironolactone (ALDACTONE) 100 MG tablet Take 100 mg by mouth every evening.   timolol (TIMOPTIC) 0.5 % ophthalmic solution Place 1 drop into both eyes 2 (two) times daily.   vitamin B-12 (CYANOCOBALAMIN) 500 MCG tablet Take 1,000 mcg by mouth every evening.     Review of Systems:   Please see the history of present illness.    All other systems reviewed and are negative.     EKGs/Labs/Other Test Reviewed:   EKG: EKG from June 2024 demonstrates a first-degree AV block and left anterior fascicular block  EKG Interpretation Date/Time:  Wednesday May 28 2023 10:46:30 EST Ventricular Rate:  91 PR Interval:  290 QRS Duration:  84 QT Interval:  336 QTC Calculation: 413 R Axis:   -61  Text Interpretation: Sinus rhythm with 1st degree A-V block Left anterior fascicular block Nonspecific ST and T wave abnormality When compared with ECG of 25-Sep-2022 08:18, Vent. rate has increased BY  31 BPM Confirmed by Alverda Skeans ([id number]) on 05/28/2023 10:53:27 AM         Risk Assessment/Calculations:          Physical Exam:   VS:  BP (!) 150/80   Pulse 94   Ht 5\' 5"  (1.651 m)   Wt 183 lb 9.6 oz (83.3 kg)   SpO2 99%   BMI 30.55 kg/m    HYPERTENSION CONTROL Vitals:   05/28/23 1039 05/28/23 1102  BP: (!) 156/90 (!) 150/80    The patient's blood pressure is elevated above target today.  In order to address the patient's elevated BP: A current anti-hypertensive medication was adjusted today.      Wt Readings from Last 3 Encounters:  05/28/23 183 lb 9.6 oz (83.3 kg)  05/27/23 188 lb (85.3 kg)  04/15/23 190 lb (86.2 kg)      GENERAL:  No apparent distress, AOx3 HEENT:  No carotid bruits, +2 carotid impulses, no scleral icterus CAR: RRR no murmurs, gallops, rubs, or thrills RES:   Clear to auscultation bilaterally ABD:  Soft, nontender, nondistended, positive bowel sounds x 4 VASC:  +2 radial pulses, +2 carotid pulses NEURO:  CN 2-12 grossly intact; motor and sensory grossly intact PSYCH:  No active depression or anxiety EXT:  No edema, ecchymosis, or cyanosis  Signed, Orbie Pyo, MD  05/28/2023 11:16 AM    [facility] [address] [place], [place], [place]  [address] Phone: [phone]; Fax: [phone]   Note:  This document was prepared using Dragon voice  recognition software and may include unintentional dictation errors.

## 2023-05-23 NOTE — Progress Notes (Signed)
 [name] [address] [place] [address] Phone: [phone] Subjective:   Jade Sullivan, am serving as a scribe for Dr. Antoine Primas.  I'm seeing this patient by the request  of:  Jarrett Soho, PA-C  CC: Bilateral knee pain, right shoulder pain  KGM:[id number]  04/10/2023 Repeat steroid injections given today, tolerated the procedure well. Could be a candidate for the viscosupplementation again. Due to patient having a procedure did not want to do the full dose. Did 1/2 cc of Kenalog in each knee. Encourage patient to monitor her blood sugar closely before her procedure. Follow-up with me again in 8 to 10 weeks.   Update 05/22/2023 Jade Sullivan is a 78 y.o. female coming in with complaint of B knee pain. Patient states that her knees are tight due to weather. Pain in lateral aspect of R knee.   Also c/o pain in R shoulder. Would like refill of gabapentin. At night pain wakes her up. Painful to extend and IR arm over the deltoid muscle.   90/64 yesterday morning 107/84 last night 144/84 today before she left house     Past Medical History:  Diagnosis Date   Allergy    seasonal allergies   Arthritis    bilateral knees   Cataract    not a surgical candidate at this time (05/29/2020)   Chronic kidney disease    COVID-19 04/2019   Diabetes mellitus without complication (HCC)    on meds   Diverticulosis of colon (without mention of hemorrhage)    Dysmetabolic syndrome X    Glaucoma    on meds   Gout    H/O hyperthyroidism    By patient h/o hyperthyroidism with protosis, s/p subtotal thyroidectomy. On no replacement therapy. Last thyroid functions 2013 in [place]  Plan Will need thyroid functions Feb '15 with recommendations to follow.     Heart murmur    hx of   Hyperlipidemia    on meds   Hypertension    on multiple meds   Hypothyroidism    on meds   Kidney lesion    On supplemental oxygen by nasal cannula    at  bedtime   Pancreatic cyst    Tubular adenoma of colon    Past Surgical History:  Procedure Laterality Date   BIOPSY  06/27/2020   Procedure: BIOPSY;  Surgeon: Beverley Fiedler, MD;  Location: [facility];  Service: Gastroenterology;;   CESAREAN SECTION  1969   COLONOSCOPY  2016   JMP-MAC-2 day suprep (good)-TICS/TA   COLONOSCOPY WITH PROPOFOL N/A 06/27/2020   Procedure: COLONOSCOPY WITH PROPOFOL;  Surgeon: Beverley Fiedler, MD;  Location: [facility];  Service: Gastroenterology;  Laterality: N/A;   DILATATION & CURETTAGE/HYSTEROSCOPY WITH MYOSURE N/A 04/15/2023   Procedure: DILATATION & CURETTAGE/HYSTEROSCOPY WITH MYOSURE Myomectomy and endometrial biopsy;  Surgeon: Rosalyn Gess, MD;  Location: [facility] ;  Service: Gynecology;  Laterality: N/A;   EYE SURGERY     LIPOMA EXCISION Right 11/24/2014   Procedure: EXCISION RIGHT SHOULDER LIPOMA;  Surgeon: Harriette Bouillon, MD;  Location: [facility];  Service: General;  Laterality: Right;   POLYPECTOMY  2016   TA   POLYPECTOMY  06/27/2020   Procedure: POLYPECTOMY;  Surgeon: Beverley Fiedler, MD;  Location: [facility];  Service: Gastroenterology;;   THYROIDECTOMY, PARTIAL  1981   TONSILLECTOMY     UMBILICAL HERNIA REPAIR  2000   WISDOM TOOTH EXTRACTION     Social  History   Socioeconomic History   Marital status: Divorced    Spouse name: Not on file   Number of children: 1   Years of education: Not on file   Highest education level: Not on file  Occupational History   Occupation: Retired  Tobacco Use   Smoking status: Former   Smokeless tobacco: Never  Advertising account planner   Vaping status: Never Used  Substance and Sexual Activity   Alcohol use: No    Alcohol/week: 0.0 standard drinks of alcohol   Drug use: No   Sexual activity: Not Currently    Partners: Male    Birth control/protection: Post-menopausal    Comment: 1st intercourse- 16, partners- 5, divorced  Other Topics Concern   Not on file  Social History  Narrative   Divorced   Retired - Engineer, petroleum   Social Drivers of Corporate investment banker Strain: Low Risk  (10/10/2021)   Overall Financial Resource Strain (CARDIA)    Difficulty of Paying Living Expenses: Not hard at all  Food Insecurity: No Food Insecurity (12/20/2021)   Hunger Vital Sign    Worried About Running Out of Food in the Last Year: Never true    Ran Out of Food in the Last Year: Never true  Transportation Needs: No Transportation Needs (12/20/2021)   PRAPARE - Administrator, Civil Service (Medical): No    Lack of Transportation (Non-Medical): No  Physical Activity: Sufficiently Active (10/10/2021)   Exercise Vital Sign    Days of Exercise per Week: 5 days    Minutes of Exercise per Session: 30 min  Stress: No Stress Concern Present (10/10/2021)   [employer] of Occupational Health - Occupational Stress Questionnaire    Feeling of Stress : Not at all  Social Connections: Moderately Integrated (10/10/2021)   Social Connection and Isolation Panel [NHANES]    Frequency of Communication with Friends and Family: More than three times a week    Frequency of Social Gatherings with Friends and Family: Twice a week    Attends Religious Services: More than 4 times per year    Active Member of [employer] or Organizations: Yes    Attends Engineer, structural: More than 4 times per year    Marital Status: Divorced   Allergies  Allergen Reactions   Amlodipine     Gingival hyperplasia   Hctz [Hydrochlorothiazide] Other (See Comments)    gout   Jardiance [Empagliflozin] Other (See Comments)    Yeast Infections   Family History  Problem Relation Age of Onset   Diabetes Mother        DM2   Hypertension Mother    Pneumonia Mother        died from this condition   Colon polyps Mother    Heart disease Father        died from this condition   Colon polyps Father    Kidney disease Sister        HBP and DM2; relative died from this condition   Colon  polyps Sister    Heart disease Brother        MI - HBP and DM2   Colon polyps Brother    Colon polyps Brother     Current Outpatient Medications (Endocrine & Metabolic):    levothyroxine (SYNTHROID) 75 MCG tablet, Take 75 mcg by mouth daily before breakfast.   metformin (FORTAMET) 500 MG (OSM) 24 hr tablet, Take 500 mg by mouth daily with breakfast.  Semaglutide,0.25 or 0.5MG /DOS, 2 MG/3ML SOPN, Inject 0.5 mg into the skin once a week.  Current Outpatient Medications (Cardiovascular):    atorvastatin (LIPITOR) 40 MG tablet, Take 1 tablet (40 mg total) by mouth daily.   cloNIDine (CATAPRES - DOSED IN MG/24 HR) 0.3 mg/24hr patch, 0.3 mg once a week.   cloNIDine (CATAPRES) 0.2 MG tablet, Take 1 tablet (0.2 mg total) by mouth 3 (three) times daily.   irbesartan (AVAPRO) 300 MG tablet, TAKE 1 TABLET(300 MG) BY MOUTH IN THE MORNING   minoxidil (LONITEN) 2.5 MG tablet, Take 5 mg by mouth in the morning, at noon, and at bedtime.   nebivolol (BYSTOLIC) 5 MG tablet, Take 5 mg by mouth in the morning.   REPATHA SURECLICK 140 MG/ML SOAJ, ADMINISTER 1 ML UNDER THE SKIN EVERY 14 DAYS   spironolactone (ALDACTONE) 100 MG tablet, Take 100 mg by mouth every evening.   Current Outpatient Medications (Analgesics):    aspirin EC 81 MG tablet, Take 81 mg by mouth every evening. Swallow whole.   aspirin-acetaminophen-caffeine (EXCEDRIN MIGRAINE) 250-250-65 MG tablet, Take 1-2 tablets by mouth every 6 (six) hours as needed for headache (sinus headaches/pain).   ibuprofen (ADVIL) 600 MG tablet, Take 1 tablet (600 mg total) by mouth every 8 (eight) hours as needed.  Current Outpatient Medications (Hematological):    vitamin B-12 (CYANOCOBALAMIN) 500 MCG tablet, Take 1,000 mcg by mouth every evening.  Current Outpatient Medications (Other):    ALPRAZolam (XANAX) 0.25 MG tablet, Take 1 tablet (0.25 mg total) by mouth 2 (two) times daily as needed for anxiety (prior to dentist).   cholecalciferol (VITAMIN D3)  25 MCG (1000 UT) tablet, Take 1,000 Units by mouth daily.   fish oil-omega-3 fatty acids 1000 MG capsule, Take 2 g by mouth daily.   gabapentin (NEURONTIN) 100 MG capsule, Take 2 capsules (200 mg total) by mouth at bedtime.   latanoprost (XALATAN) 0.005 % ophthalmic solution, Place 1 drop into both eyes at bedtime.    LORazepam (ATIVAN) 1 MG tablet, Take 1 mg by mouth once.   Multiple Vitamin (MULTIVITAMIN WITH MINERALS) TABS tablet, Take 1 tablet by mouth every evening.   timolol (TIMOPTIC) 0.5 % ophthalmic solution, Place 1 drop into both eyes 2 (two) times daily.   Reviewed prior external information including notes and imaging from  primary care provider As well as notes that were available from care everywhere and other healthcare systems.  Past medical history, social, surgical and family history all reviewed in electronic medical record.  No pertanent information unless stated regarding to the chief complaint.   Review of Systems:  No headache, visual changes, nausea, vomiting, diarrhea, constipation, dizziness, abdominal pain, skin rash, fevers, chills, night sweats, weight loss, swollen lymph nodes, body aches, joint swelling, chest pain, shortness of breath, mood changes. POSITIVE muscle aches  Objective  Blood pressure (!) 148/88, pulse 74, height 5\' 5"  (1.651 m), weight 188 lb (85.3 kg), SpO2 98%.   General: No apparent distress alert and oriented x3 mood and affect normal, dressed appropriately.  HEENT: Pupils equal, extraocular movements intact  Respiratory: Patient's speak in full sentences and does not appear short of breath  Cardiovascular: No lower extremity edema, non tender, no erythema  Significant arthritic changes of multiple joints.  Bilateral knees do have trace effusion noted.  Limited range of motion noted. Crepitus noted. Right shoulder does have some positive impingement noted.  Rotator cuff appears to be intact.  Patient though does have positive Neer and  Juanetta Gosling  noted.  After informed written and verbal consent, patient was seated on exam table. Right shoulder was prepped with alcohol swab and utilizing posterior approach, patient's right glenohumeral space was injected with 4:1  marcaine 0.5%: Kenalog 40mg /dL. Patient tolerated the procedure well without immediate complications.  After informed written and verbal consent, patient was seated on exam table. Right knee was prepped with alcohol swab and utilizing anterolateral approach, patient's right knee space was injected with 4:1  marcaine 0.5%: Kenalog 40mg /dL. Patient tolerated the procedure well without immediate complications.  After informed written and verbal consent, patient was seated on exam table. Left knee was prepped with alcohol swab and utilizing anterolateral approach, patient's left knee space was injected with 4:1  marcaine 0.5%: Kenalog 40mg /dL. Patient tolerated the procedure well without immediate complications.    Impression and Recommendations:    The above documentation has been reviewed and is accurate and complete Judi Saa, DO

## 2023-05-27 ENCOUNTER — Ambulatory Visit (INDEPENDENT_AMBULATORY_CARE_PROVIDER_SITE_OTHER): Payer: Medicare Other

## 2023-05-27 ENCOUNTER — Encounter: Payer: Self-pay | Admitting: Family Medicine

## 2023-05-27 ENCOUNTER — Ambulatory Visit: Payer: Medicare Other | Admitting: Family Medicine

## 2023-05-27 VITALS — BP 148/88 | HR 74 | Ht 65.0 in | Wt 188.0 lb

## 2023-05-27 DIAGNOSIS — I129 Hypertensive chronic kidney disease with stage 1 through stage 4 chronic kidney disease, or unspecified chronic kidney disease: Secondary | ICD-10-CM | POA: Diagnosis not present

## 2023-05-27 DIAGNOSIS — M25511 Pain in right shoulder: Secondary | ICD-10-CM | POA: Diagnosis not present

## 2023-05-27 DIAGNOSIS — G8929 Other chronic pain: Secondary | ICD-10-CM | POA: Insufficient documentation

## 2023-05-27 DIAGNOSIS — M170 Bilateral primary osteoarthritis of knee: Secondary | ICD-10-CM

## 2023-05-27 DIAGNOSIS — M19011 Primary osteoarthritis, right shoulder: Secondary | ICD-10-CM | POA: Diagnosis not present

## 2023-05-27 MED ORDER — GABAPENTIN 100 MG PO CAPS: 200.0000 mg | ORAL_CAPSULE | Freq: Every day | ORAL | 0 refills | Status: DC

## 2023-05-27 NOTE — Patient Instructions (Addendum)
 Xray today Injected R shoulder Injected B knees Refilled gabapentin Will get approval for gel Talk to cardiology tmrw See me again in 6-8 weeks

## 2023-05-27 NOTE — Assessment & Plan Note (Signed)
 Given injection today with improvement in range of motion noted.  Discussed icing regimen and home exercises.  Discussed which activities to do and which ones to avoid.  Increase activity slowly.  Follow-up again in 6 to 8 weeks.

## 2023-05-27 NOTE — Assessment & Plan Note (Signed)
 Chronic problem with worsening symptoms.  Discussed icing regimen and home exercises, patient still wants to avoid any surgical intervention.  Does have discussed different bracing.  Discussed continuing to stay active where possible.  Follow-up again in 6 to 12 weeks otherwise.

## 2023-05-27 NOTE — Assessment & Plan Note (Signed)
 Following up with cardiology seems to be having some labile aspects of it.  Regular rate and rhythm on the heart exam today.

## 2023-05-28 ENCOUNTER — Telehealth: Payer: Self-pay

## 2023-05-28 ENCOUNTER — Ambulatory Visit: Payer: Medicare Other | Attending: Internal Medicine | Admitting: Internal Medicine

## 2023-05-28 ENCOUNTER — Encounter: Payer: Self-pay | Admitting: Internal Medicine

## 2023-05-28 VITALS — BP 150/80 | HR 94 | Ht 65.0 in | Wt 183.6 lb

## 2023-05-28 DIAGNOSIS — E785 Hyperlipidemia, unspecified: Secondary | ICD-10-CM

## 2023-05-28 DIAGNOSIS — Z6833 Body mass index (BMI) 33.0-33.9, adult: Secondary | ICD-10-CM

## 2023-05-28 DIAGNOSIS — E1169 Type 2 diabetes mellitus with other specified complication: Secondary | ICD-10-CM

## 2023-05-28 DIAGNOSIS — E1159 Type 2 diabetes mellitus with other circulatory complications: Secondary | ICD-10-CM | POA: Diagnosis not present

## 2023-05-28 DIAGNOSIS — E119 Type 2 diabetes mellitus without complications: Secondary | ICD-10-CM

## 2023-05-28 DIAGNOSIS — E1122 Type 2 diabetes mellitus with diabetic chronic kidney disease: Secondary | ICD-10-CM | POA: Diagnosis not present

## 2023-05-28 DIAGNOSIS — I288 Other diseases of pulmonary vessels: Secondary | ICD-10-CM

## 2023-05-28 DIAGNOSIS — I700 Atherosclerosis of aorta: Secondary | ICD-10-CM | POA: Diagnosis not present

## 2023-05-28 DIAGNOSIS — I3139 Other pericardial effusion (noninflammatory): Secondary | ICD-10-CM

## 2023-05-28 DIAGNOSIS — Z01812 Encounter for preprocedural laboratory examination: Secondary | ICD-10-CM | POA: Diagnosis not present

## 2023-05-28 DIAGNOSIS — E7841 Elevated Lipoprotein(a): Secondary | ICD-10-CM | POA: Diagnosis not present

## 2023-05-28 DIAGNOSIS — I152 Hypertension secondary to endocrine disorders: Secondary | ICD-10-CM

## 2023-05-28 DIAGNOSIS — N183 Chronic kidney disease, stage 3 unspecified: Secondary | ICD-10-CM

## 2023-05-28 NOTE — Telephone Encounter (Signed)
**  Patient scheduled for 08/26/23**  Durolane authorized for bilateral knees Plan covers 80% of the allowable amount for Durolane And 100% of the allowable amount for the procedure 20610/20611 Deductible does not apply to these services Patient has a $20 copay whether an office visit is billed or not Only on copay per DOS Once OOP is met patients coverage goes to100% And copay will no longer apply OOP max $3900 has met $466.44 No pre cert or referrals needed Document scanned

## 2023-05-28 NOTE — Patient Instructions (Addendum)
 Medication Instructions:  Your physician recommends that you continue on your current medications as directed. Please refer to the Current Medication list given to you today.  *If you need a refill on your cardiac medications before your next appointment, please call your pharmacy*  Lab Work: TODAY: CBC, BMET If you have labs (blood work) drawn today and your tests are completely normal, you will receive your results only by: MyChart Message (if you have MyChart) OR A paper copy in the mail If you have any lab test that is abnormal or we need to change your treatment, we will call you to review the results.  Testing/Procedures: Your physician has requested that you have a cardiac catheterization. Cardiac catheterization is used to diagnose and/or treat various heart conditions. Doctors may recommend this procedure for a number of different reasons. The most common reason is to evaluate chest pain. Chest pain can be a symptom of coronary artery disease (CAD), and cardiac catheterization can show whether plaque is narrowing or blocking your heart's arteries. This procedure is also used to evaluate the valves, as well as measure the blood flow and oxygen levels in different parts of your heart. For further information please visit [email or web address] Please follow instruction sheet, as given.  Follow-Up: At [facility], you and your health needs are our priority.  As part of our continuing mission to provide you with exceptional heart care, we have created designated Provider Care Teams.  These Care Teams include your primary Cardiologist (physician) and Advanced Practice Providers (APPs -  Physician Assistants and Nurse Practitioners) who all work together to provide you with the care you need, when you need it.  Your next appointment:   3 month(s)  The format for your next appointment:   In Person  Provider:   Orbie Pyo, MD {  Other Instructions       Cardiac/Peripheral  Catheterization   You are scheduled for a Cardiac Catheterization on Tuesday, March 4 with Dr. Alverda Skeans.  1. Please arrive at the [facility] (Main Entrance A) at [facility]: [address] [place], [place] [address] at 7:00 AM (This time is 2 hour(s) before your procedure to ensure your preparation).   Free valet parking service is available. You will check in at ADMITTING. The support person will be asked to wait in the waiting room.  It is OK to have someone drop you off and come back when you are ready to be discharged.        Special note: Every effort is made to have your procedure done on time. Please understand that emergencies sometimes delay scheduled procedures.  2. Diet: Do not eat solid foods after midnight.  You may have clear liquids until 5 AM the day of the procedure.  3. Labs: TODAY (CBC, BMET)  4. Medication instructions in preparation for your procedure:   Contrast Allergy: No  Stop taking, spironolactone Monday, March 3, Do not take metformin the morning of your procedure on 06/03/23 and hold for 2 days after your heart cath (06/04/23 and 06/05/23).  On the morning of your procedure, take Aspirin 81 mg and any morning medicines NOT listed above.  You may use sips of water.  5. Plan to go home the same day, you will only stay overnight if medically necessary. 6. You MUST have a responsible adult to drive you home. 7. An adult MUST be with you the first 24 hours after you arrive home. 8. Bring a current list of  your medications, and the last time and date medication taken. 9. Bring ID and current insurance cards. 10.Please wear clothes that are easy to get on and off and wear slip-on shoes.  Thank you for allowing [place] to care for you!   -- [facility]      1st Floor: - Lobby - Registration  - Pharmacy  - Lab - Cafe  2nd Floor: - PV Lab - Diagnostic Testing (echo, CT, nuclear med)  3rd Floor: -  Vacant  4th Floor: - TCTS (cardiothoracic surgery) - AFib Clinic - Structural Heart Clinic - Vascular Surgery  - Vascular Ultrasound  5th Floor: - [facility] (general and EP) - Clinical Pharmacy for coumadin, hypertension, lipid, weight-loss medications, and med management appointments    Valet parking services will be available as well.

## 2023-05-29 ENCOUNTER — Encounter: Payer: Self-pay | Admitting: Internal Medicine

## 2023-05-29 ENCOUNTER — Telehealth: Payer: Self-pay | Admitting: Internal Medicine

## 2023-05-29 LAB — CBC
Hematocrit: 39.8 % (ref 34.0–46.6)
Hemoglobin: 12.8 g/dL (ref 11.1–15.9)
MCH: 29.8 pg (ref 26.6–33.0)
MCHC: 32.2 g/dL (ref 31.5–35.7)
MCV: 93 fL (ref 79–97)
Platelets: 207 10*3/uL (ref 150–450)
RBC: 4.3 x10E6/uL (ref 3.77–5.28)
RDW: 13.3 % (ref 11.7–15.4)
WBC: 7 10*3/uL (ref 3.4–10.8)

## 2023-05-29 LAB — BASIC METABOLIC PANEL
BUN/Creatinine Ratio: 26 (ref 12–28)
BUN: 28 mg/dL — ABNORMAL HIGH (ref 8–27)
CO2: 20 mmol/L (ref 20–29)
Calcium: 10.8 mg/dL — ABNORMAL HIGH (ref 8.7–10.3)
Chloride: 109 mmol/L — ABNORMAL HIGH (ref 96–106)
Creatinine, Ser: 1.07 mg/dL — ABNORMAL HIGH (ref 0.57–1.00)
Glucose: 143 mg/dL — ABNORMAL HIGH (ref 70–99)
Potassium: 4.8 mmol/L (ref 3.5–5.2)
Sodium: 145 mmol/L — ABNORMAL HIGH (ref 134–144)
eGFR: 53 mL/min/{1.73_m2} — ABNORMAL LOW (ref >59)

## 2023-05-29 NOTE — Telephone Encounter (Signed)
*  STAT* If patient is at the pharmacy, call can be transferred to refill team.   1. Which medications need to be refilled? (please list name of each medication and dose if known) REPATHA SURECLICK 140 MG/ML SOAJ   2. Which pharmacy/location (including street and city if local pharmacy) is medication to be sent to?  [id number] - [place], [place] - [address] AT [facility] OF [address]    3. Do they need a 30 day or 90 day supply? 90  Patient states that her pharmacy has received the refills. Requesting this be sent again.

## 2023-05-29 NOTE — Telephone Encounter (Signed)
 Spoke with patient and inform her that she has 11 refills  on her repatha. She states she called and they told her she does not have refills  Called pharmacy and verified refills on repatha. It will be  ready Saturday.  Patient is aware

## 2023-05-29 NOTE — Telephone Encounter (Signed)
 Pt returning call

## 2023-05-29 NOTE — Telephone Encounter (Signed)
 Left voicemail to return call to office

## 2023-05-29 NOTE — Telephone Encounter (Signed)
 Noted.

## 2023-06-02 ENCOUNTER — Telehealth: Payer: Self-pay | Admitting: *Deleted

## 2023-06-02 NOTE — Telephone Encounter (Signed)
 Right Heart Cath scheduled at [facility] for: Tuesday June 03, 2023 9 AM Arrival time [facility] Main Entrance A at: 7 AM  Nothing to eat after midnight prior to procedure, clear liquids until 5 AM day of procedure.  Medication instructions: -Hold:  Metformin/hydrochlorothiazide-AM of procedure  Ozempic-Weekly on Mondays-will hold until post procedure. -Other usual morning medications can be taken with sips of water.  Plan to go home the same day, you will only stay overnight if medically necessary.  You must have responsible adult to drive you home.  Someone must be with you the first 24 hours after you arrive home.  Reviewed procedure instructions with patient

## 2023-06-03 ENCOUNTER — Other Ambulatory Visit: Payer: Self-pay | Admitting: Cardiology

## 2023-06-03 ENCOUNTER — Encounter (HOSPITAL_COMMUNITY): Payer: Self-pay | Admitting: Internal Medicine

## 2023-06-03 ENCOUNTER — Other Ambulatory Visit: Payer: Self-pay

## 2023-06-03 ENCOUNTER — Encounter (HOSPITAL_COMMUNITY)
Admission: RE | Disposition: A | Discharge status: Home / Self Care | Payer: Self-pay | Source: Home / Self Care | Attending: Internal Medicine

## 2023-06-03 ENCOUNTER — Ambulatory Visit (HOSPITAL_COMMUNITY)
Admission: RE | Admit: 2023-06-03 | Discharge: 2023-06-03 | Disposition: A | Discharge status: Home / Self Care | Payer: Medicare Other | Attending: Internal Medicine | Admitting: Internal Medicine

## 2023-06-03 DIAGNOSIS — Z7982 Long term (current) use of aspirin: Secondary | ICD-10-CM | POA: Insufficient documentation

## 2023-06-03 DIAGNOSIS — I3139 Other pericardial effusion (noninflammatory): Secondary | ICD-10-CM | POA: Insufficient documentation

## 2023-06-03 DIAGNOSIS — Z79899 Other long term (current) drug therapy: Secondary | ICD-10-CM | POA: Insufficient documentation

## 2023-06-03 DIAGNOSIS — I452 Bifascicular block: Secondary | ICD-10-CM | POA: Diagnosis not present

## 2023-06-03 DIAGNOSIS — Z7984 Long term (current) use of oral hypoglycemic drugs: Secondary | ICD-10-CM | POA: Insufficient documentation

## 2023-06-03 DIAGNOSIS — E7841 Elevated Lipoprotein(a): Secondary | ICD-10-CM | POA: Insufficient documentation

## 2023-06-03 DIAGNOSIS — I700 Atherosclerosis of aorta: Secondary | ICD-10-CM | POA: Insufficient documentation

## 2023-06-03 DIAGNOSIS — E1122 Type 2 diabetes mellitus with diabetic chronic kidney disease: Secondary | ICD-10-CM | POA: Diagnosis not present

## 2023-06-03 DIAGNOSIS — E1159 Type 2 diabetes mellitus with other circulatory complications: Secondary | ICD-10-CM | POA: Insufficient documentation

## 2023-06-03 DIAGNOSIS — Z7985 Long-term (current) use of injectable non-insulin antidiabetic drugs: Secondary | ICD-10-CM | POA: Insufficient documentation

## 2023-06-03 DIAGNOSIS — I129 Hypertensive chronic kidney disease with stage 1 through stage 4 chronic kidney disease, or unspecified chronic kidney disease: Secondary | ICD-10-CM | POA: Diagnosis not present

## 2023-06-03 DIAGNOSIS — N1831 Chronic kidney disease, stage 3a: Secondary | ICD-10-CM | POA: Diagnosis not present

## 2023-06-03 DIAGNOSIS — I288 Other diseases of pulmonary vessels: Secondary | ICD-10-CM | POA: Diagnosis not present

## 2023-06-03 HISTORY — PX: RIGHT HEART CATH: CATH118263

## 2023-06-03 LAB — POCT I-STAT EG7
Acid-base deficit: 1 mmol/L (ref 0.0–2.0)
Bicarbonate: 24.1 mmol/L (ref 20.0–28.0)
Calcium, Ion: 1.32 mmol/L (ref 1.15–1.40)
HCT: 35 % — ABNORMAL LOW (ref 36.0–46.0)
Hemoglobin: 11.9 g/dL — ABNORMAL LOW (ref 12.0–15.0)
O2 Saturation: 79 %
Potassium: 3.8 mmol/L (ref 3.5–5.1)
Sodium: 144 mmol/L (ref 135–145)
TCO2: 25 mmol/L (ref 22–32)
pCO2, Ven: 39.1 mmHg — ABNORMAL LOW (ref 44–60)
pH, Ven: 7.397 (ref 7.25–7.43)
pO2, Ven: 43 mmHg (ref 32–45)

## 2023-06-03 LAB — POCT I-STAT 7, (LYTES, BLD GAS, ICA,H+H)
Acid-base deficit: 1 mmol/L (ref 0.0–2.0)
Bicarbonate: 23.6 mmol/L (ref 20.0–28.0)
Calcium, Ion: 1.29 mmol/L (ref 1.15–1.40)
HCT: 34 % — ABNORMAL LOW (ref 36.0–46.0)
Hemoglobin: 11.6 g/dL — ABNORMAL LOW (ref 12.0–15.0)
O2 Saturation: 73 %
Potassium: 3.7 mmol/L (ref 3.5–5.1)
Sodium: 144 mmol/L (ref 135–145)
TCO2: 25 mmol/L (ref 22–32)
pCO2 arterial: 39.9 mmHg (ref 32–48)
pH, Arterial: 7.38 (ref 7.35–7.45)
pO2, Arterial: 40 mmHg — CL (ref 83–108)

## 2023-06-03 LAB — GLUCOSE, CAPILLARY
Glucose-Capillary: 141 mg/dL — ABNORMAL HIGH (ref 70–99)
Glucose-Capillary: 124 mg/dL — ABNORMAL HIGH (ref 70–99)

## 2023-06-03 SURGERY — RIGHT HEART CATH

## 2023-06-03 MED ORDER — SODIUM CHLORIDE 0.9% FLUSH: 3.0000 mL | Freq: Two times a day (BID) | INTRAVENOUS | Status: DC

## 2023-06-03 MED ORDER — FENTANYL CITRATE (PF) 100 MCG/2ML IJ SOLN
INTRAMUSCULAR | Status: AC
  Filled 2023-06-03: qty 2

## 2023-06-03 MED ORDER — LIDOCAINE HCL (PF) 1 % IJ SOLN
INTRAMUSCULAR | Status: DC | PRN
  Administered 2023-06-03: 5 mL

## 2023-06-03 MED ORDER — LABETALOL HCL 5 MG/ML IV SOLN: 10.0000 mg | INTRAVENOUS | Status: DC | PRN

## 2023-06-03 MED ORDER — SODIUM CHLORIDE 0.9% FLUSH: 3.0000 mL | INTRAVENOUS | Status: DC | PRN

## 2023-06-03 MED ORDER — SODIUM CHLORIDE 0.9 % IV SOLN: INTRAVENOUS | Status: DC

## 2023-06-03 MED ORDER — MIDAZOLAM HCL 2 MG/2ML IJ SOLN
INTRAMUSCULAR | Status: AC
  Filled 2023-06-03: qty 2

## 2023-06-03 MED ORDER — FENTANYL CITRATE (PF) 100 MCG/2ML IJ SOLN
INTRAMUSCULAR | Status: DC | PRN
  Administered 2023-06-03: 25 ug via INTRAVENOUS

## 2023-06-03 MED ORDER — SODIUM CHLORIDE 0.9 % IV SOLN: 250.0000 mL | INTRAVENOUS | Status: DC | PRN

## 2023-06-03 MED ORDER — HYDRALAZINE HCL 20 MG/ML IJ SOLN: 10.0000 mg | INTRAMUSCULAR | Status: DC | PRN

## 2023-06-03 MED ORDER — MIDAZOLAM HCL 2 MG/2ML IJ SOLN
INTRAMUSCULAR | Status: DC | PRN
  Administered 2023-06-03: 1 mg via INTRAVENOUS

## 2023-06-03 MED ORDER — LIDOCAINE HCL (CARDIAC) PF 100 MG/5ML IV SOSY
PREFILLED_SYRINGE | INTRAVENOUS | Status: AC
  Filled 2023-06-03: qty 5

## 2023-06-03 MED ORDER — HEPARIN (PORCINE) IN NACL 1000-0.9 UT/500ML-% IV SOLN
INTRAVENOUS | Status: DC | PRN
  Administered 2023-06-03: 500 mL

## 2023-06-03 MED ORDER — ONDANSETRON HCL 4 MG/2ML IJ SOLN: 4.0000 mg | Freq: Four times a day (QID) | INTRAMUSCULAR | Status: DC | PRN

## 2023-06-03 MED ORDER — ACETAMINOPHEN 325 MG PO TABS: 650.0000 mg | ORAL_TABLET | ORAL | Status: DC | PRN

## 2023-06-03 SURGICAL SUPPLY — 6 items
CATH SWAN GANZ 7F STRAIGHT (CATHETERS) IMPLANT
GLIDESHEATH SLENDER 7FR .021G (SHEATH) IMPLANT
GUIDEWIRE .025 260CM (WIRE) IMPLANT
PACK CARDIAC CATHETERIZATION (CUSTOM PROCEDURE TRAY) IMPLANT
TRANSDUCER W/STOPCOCK (MISCELLANEOUS) IMPLANT
TUBING ART PRESS 72 MALE/FEM (TUBING) IMPLANT

## 2023-06-03 NOTE — Discharge Instructions (Signed)

## 2023-06-03 NOTE — Progress Notes (Signed)
 This RN asked to assist with patient after she stated she was unable to use her R hand. Jade Abed NP paged and Jade Caffey MD called. Both to bedside to assess patient. MD removed coban from patients R brachial site and spoke with patient. Per MD, patient able to discharge home and instructed to call if there's further issues or symptoms don't resolve.

## 2023-06-03 NOTE — Progress Notes (Signed)
 CRP and echo ordered per Dr. Lynnette Caffey

## 2023-06-03 NOTE — Interval H&P Note (Signed)
 History and Physical Interval Note:  06/03/2023 8:12 AM  Jade Sullivan  has presented today for surgery, with the diagnosis of hp.  The various methods of treatment have been discussed with the patient and family. After consideration of risks, benefits and other options for treatment, the patient has consented to  Procedure(s): RIGHT HEART CATH (N/A) as a surgical intervention.  The patient's history has been reviewed, patient examined, no change in status, stable for surgery.  I have reviewed the patient's chart and labs.  Questions were answered to the patient's satisfaction.     Orbie Pyo

## 2023-06-03 NOTE — Progress Notes (Signed)
 When taking patient to go to wheelchair for DC, pt said her right arm is not working right, states its floppy, she wondered if she could have had a stroke in her right arm. Dr Tollie Pizza and Lillia Abed NP/cards was paged. Right wrist radial pulse 2+, right brachial vein no hematoma, moderate bilateral grips, pt standing and ambulating well without wobble, pt dressed herself when I arrived to take out for DC, she needed assistance for buttoning shirt. Will continue to monitor.

## 2023-06-06 ENCOUNTER — Encounter: Payer: Self-pay | Admitting: Internal Medicine

## 2023-06-06 ENCOUNTER — Ambulatory Visit (HOSPITAL_COMMUNITY): Attending: Internal Medicine

## 2023-06-06 DIAGNOSIS — I3139 Other pericardial effusion (noninflammatory): Secondary | ICD-10-CM | POA: Insufficient documentation

## 2023-06-06 LAB — ECHOCARDIOGRAM COMPLETE
Area-P 1/2: 4.6 cm2
P 1/2 time: 447 ms
S' Lateral: 2.7 cm

## 2023-06-16 ENCOUNTER — Telehealth: Payer: Self-pay | Admitting: Pharmacist

## 2023-06-16 ENCOUNTER — Encounter: Payer: Self-pay | Admitting: Family Medicine

## 2023-06-16 MED ORDER — SEMAGLUTIDE (1 MG/DOSE) 4 MG/3ML ~~LOC~~ SOPN: 1.0000 mg | PEN_INJECTOR | SUBCUTANEOUS | 0 refills | Status: DC

## 2023-06-16 NOTE — Telephone Encounter (Signed)
 Call pt to titrate Ozempic dose from 0.5 mg once week to 1 mg once a week. Pt reports she is tolerating 0.5 mg dose well. Still has 2 more doses of 0.5 mg. Therapy were held due to procedure. Ready to move on to 1 mg dose. previously FBG ~140 range since on Ozemic ~ 120 range.   Will f/u via phone on 6 weeks for further dose titration.

## 2023-06-19 ENCOUNTER — Other Ambulatory Visit: Payer: Self-pay | Admitting: Internal Medicine

## 2023-06-24 DIAGNOSIS — Z0000 Encounter for general adult medical examination without abnormal findings: Secondary | ICD-10-CM | POA: Diagnosis not present

## 2023-06-24 DIAGNOSIS — Z1389 Encounter for screening for other disorder: Secondary | ICD-10-CM | POA: Diagnosis not present

## 2023-07-01 NOTE — Progress Notes (Unsigned)
 Cardiology Office Note:   Date:  07/02/2023  ID:  Jade BOOHER, DOB [birth year], MRN [id number] PCP:  Jarrett Soho, PA-C  [facility] Providers Cardiologist:  Alverda Skeans, MD Referring MD: Jarrett Soho, PA-C  Chief Complaint/Reason for Referral: Follow-up for hypertension, effusion ASSESSMENT:    1. Idiopathic dilatation of pulmonary artery (HCC)   2. Pericardial effusion   3. Type 2 diabetes mellitus without complication, without long-term current use of insulin (HCC)   4. Hypertension associated with diabetes (HCC)   5. Hyperlipidemia associated with type 2 diabetes mellitus (HCC)   6. Elevated lipoprotein(a)   7. Aortic atherosclerosis (HCC)   8. CKD stage 3 due to type 2 diabetes mellitus (HCC)   9. BMI 33.0-33.9,adult     PLAN:   In order of problems listed above: PA dilatation: No evidence of pulmonary hypertension.  Could be due to sleep disordered breathing.  Will refer for sleep study given resistant hypertension. Pericardial effusion: Will check CRP and if elevated will treat for pericarditis.  If not elevated will continue to monitor with serial echocardiograms in the future.  The repeat echocardiogram that was done recently demonstrated only a moderate effusion.  We do not need to necessarily pursue pericardiocentesis for diagnosis at this point in time. Type 2 diabetes mellitus: Continue aspirin 81 mg, atorvastatin 40 mg, irbesartan 300 mg, and semaglutide 1 mg q. weekly.  Defer SGLT2 inhibitor given patient intolerance. Hypertension: Continue clonidine 0.2 mg 3 times daily, irbesartan 300 mg daily, minoxidil 2.5 mg daily, nebivolol 5 mg daily, and spironolactone 100 mg daily.  Blood pressure not under good control.  Will refer for sleep study.  Will change Bystolic from 5 mg in the morning to 10 mg at bedtime.  Refer to pharmacy for further recommendations.  Obtain renal ultrasound.  May be a candidate for renal denervation potentially. Hyperlipidemia:  Continue Repatha; LDL in December was 8.   Elevated LP(a): Continue Repatha. Aortic atherosclerosis: Continue aspirin 81 mg and Repatha. CKD stage III: Continue irbesartan 300 mg daily; defer SGLT2 inhibitor due to patient intolerance. Elevated BMI: Continue semaglutide 1 mg q. weekly.            Dispo:  Return in about 6 months (around 01/01/2024).      Medication Adjustments/Labs and Tests Ordered: Current medicines are reviewed at length with the patient today.  Concerns regarding medicines are outlined above.  The following changes have been made:     Labs/tests ordered: Orders Placed This Encounter  Procedures   C-reactive protein   AMB Referral to [facility] Pharm-D   [facility] Sleep Study   VAS US RENAL ARTERY DUPLEX    Medication Changes: Meds ordered this encounter  Medications   nebivolol (BYSTOLIC) 10 MG tablet    Sig: Take 1 tablet (10 mg total) by mouth at bedtime.    Dispense:  90 tablet    Refill:  3    Dose increase    Current medicines are reviewed at length with the patient today.  The patient does not have concerns regarding medicines.  I spent 33 minutes reviewing all clinical data during and prior to this visit including all relevant imaging studies, laboratories, clinical information from other health systems and prior notes from both Cardiology and other specialties, interviewing the patient, conducting a complete physical examination, and coordinating care in order to formulate a comprehensive and personalized evaluation and treatment plan.   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  At that appointment the patient was doing very well however her blood pressure was quite elevated.  She was started on atorvastatin 40 mg, Jardiance 10 mg and given a blood pressure of 180/98 referred for blood pressure check in 1 month.   June 2023: In the interim the patient's blood pressure has been checked multiple times by multiple providers and been in the 130s over 80s.  She was seen by internal medicine in February and her blood pressure was 130/74.  Plan: Increase atorvastatin to 80 mg and check lipid panel, LFTs, and LP(a) in 2 months.  December 2024: In the interim she was seen by general cardiology in June of this year and was doing well.  No medication changes were made.  She had also been seen by Dr. Rennis Golden in started on Repatha with significant decrease in her LP(a).  The patient is doing well.  She denies any chest pain or significant shortness of breath.  She is required no emergency room visits or hospitalizations.  She tells me her blood pressures at home are really very well-controlled usually in the 120s or 130s systolic.  This also is the case when she goes to other doctors.  She is otherwise well and tolerating her medical therapy without any issues.  Plan: Check lipid panel, LFTs, referred to pharmacy for GLP-1 receptor agonist therapy, and referral for cardiac MRI given bifascicular block and left ventricular hypertrophy.  2/24: Patient consents to use of AI scribe. The patient's LDL in the interim was 8.  The patient presents with concerns regarding findings from an MRI scan showing fluid collection  around the heart and possible pulmonary hypertension.  No severe shortness of breath, chest pain, chest fullness, or difficulty breathing when lying flat. No lightheadedness or syncope. Previously experienced leg swelling, which has resolved.  History of high cholesterol initially detected on a CT scan, leading to further cardiac investigations. An ultrasound of the heart showed thickening of the heart muscle, and an abnormal EKG prompted the MRI.  Blood pressure readings have been variable, with a recent reading of 148/88 mmHg at another physician's office. Home readings have been lower, with one as low as 99/60 mmHg.  Recently received injections in the knees and neck for arthritis-related pain, which sometimes wakes him at night. An x-ray was performed to investigate the neck pain, and further evaluation is pending.  Plan: Given PA dilatation in conjunction with pericardial effusion will refer for right heart catheterization.  April 2025:  Patient consents to use of AI scribe. The patient's right heart catheterization did not demonstrate pulmonary artery hypertension.  She was referred for a CRP due to the effusion.  She was also referred for an echocardiogram which showed only a moderate effusion with no evidence of tamponade.  She has metastatic lung cancer and has completed three  months of radiation and chemotherapy. She is now on a maintenance chemotherapy regimen once a month for a year. Her condition was stable until she contracted the flu and pneumonia three weeks ago, leading to weight loss and a prolonged recovery. She is about 97% recovered but still experiences shortness of breath.  She is prediabetic and does not have diabetes at this time. She has experienced episodes of low blood pressure, with readings as low as 90/40, causing dizziness and staggering. Her blood pressure is generally well-controlled. Hydrochlorothiazide was discontinued to manage these episodes.  She has a history of  bilateral total knee replacements, which contribute to leg swelling. No chest pain, lightheadedness, blacking out spells, or difficulty breathing when lying flat. She has not experienced any issues with her current medications.             Current Medications: Current Meds  Medication Sig   ALPRAZolam (XANAX) 0.25 MG tablet Take 1 tablet (0.25 mg total) by mouth 2 (two) times daily as needed for anxiety (prior to dentist).   aspirin EC 81 MG tablet Take 81 mg by mouth every evening. Swallow whole.   aspirin-acetaminophen-caffeine (EXCEDRIN MIGRAINE) 250-250-65 MG tablet Take 1-2 tablets by mouth every 6 (six) hours as needed for headache (sinus headaches/pain).   atorvastatin (LIPITOR) 40 MG tablet Take 1 tablet (40 mg total) by mouth daily.   Cholecalciferol (VITAMIN D3 MAXIMUM STRENGTH) 125 MCG (5000 UT) capsule Take 5,000 Units by mouth daily.   cloNIDine (CATAPRES - DOSED IN MG/24 HR) 0.3 mg/24hr patch Place 0.3 mg onto the skin every Wednesday.   cloNIDine (CATAPRES) 0.2 MG tablet Take 1 tablet (0.2 mg total) by mouth 3 (three) times daily.   fish oil-omega-3 fatty acids 1000 MG capsule Take 1 g by mouth daily.   gabapentin (NEURONTIN) 100 MG capsule Take 2 capsules (200 mg total) by mouth at bedtime. (Patient taking differently: Take 100 mg by mouth 2 (two) times daily as needed (pain).)   irbesartan (AVAPRO) 300 MG tablet TAKE 1 TABLET(300 MG) BY MOUTH IN THE MORNING   latanoprost (XALATAN) 0.005 % ophthalmic solution Place 1 drop into both eyes at bedtime.    levothyroxine (SYNTHROID) 75 MCG tablet Take 75-112.5 mcg by mouth See admin instructions. Take 112.5 mg on Mondays and Wednesdays, Take 75 mcg daily on all other days   metFORMIN (GLUCOPHAGE) 500 MG tablet Take 500 mg by mouth daily.   minoxidil (LONITEN) 2.5 MG tablet Take 5 mg by mouth 3 (three) times daily.   Multiple Vitamin (MULTIVITAMIN WITH MINERALS) TABS tablet Take 2 tablets by mouth every evening.   nebivolol  (BYSTOLIC) 10 MG tablet Take 1 tablet (10 mg total) by mouth at bedtime.   REPATHA SURECLICK 140 MG/ML SOAJ ADMINISTER 1 ML UNDER THE SKIN EVERY 14 DAYS   Semaglutide, 1 MG/DOSE, 4 MG/3ML SOPN Inject 1 mg into the skin once a week.   spironolactone (ALDACTONE) 100 MG tablet Take 100 mg by mouth every evening.   timolol (TIMOPTIC) 0.5 % ophthalmic solution Place 1 drop into both eyes 2 (two) times daily.   vitamin B-12 (CYANOCOBALAMIN) 500 MCG tablet Take 500 mcg by mouth 2 (two) times daily.   [DISCONTINUED] nebivolol (BYSTOLIC) 5 MG tablet Take 5 mg by mouth in the morning.     Review of Systems:   Please see the history of present illness.    All other systems reviewed and are negative.     EKGs/Labs/Other Test Reviewed:   EKG: February 2025  demonstrates sinus rhythm with first-degree AV block and left anterior fascicular block  EKG Interpretation Date/Time:    Ventricular Rate:    PR Interval:    QRS Duration:    QT Interval:    QTC Calculation:   R Axis:      Text Interpretation:           Risk Assessment/Calculations:     STOP-Bang Score:  3       Physical Exam:   VS:  BP (!) 160/80   Pulse 72   Resp 16   Ht 5\' 5"  (1.651 m)   Wt 182 lb 12.8 oz (82.9 kg)   SpO2 98%   BMI 30.42 kg/m    HYPERTENSION CONTROL Vitals:   07/02/23 1528 07/02/23 1552  BP: (!) 178/100 (!) 160/80    The patient's blood pressure is elevated above target today.  In order to address the patient's elevated BP: A current anti-hypertensive medication was adjusted today.      Wt Readings from Last 3 Encounters:  07/02/23 182 lb 12.8 oz (82.9 kg)  06/03/23 184 lb (83.5 kg)  05/28/23 183 lb 9.6 oz (83.3 kg)      GENERAL:  No apparent distress, AOx3 HEENT:  No carotid bruits, +2 carotid impulses, no scleral icterus CAR: RRR no murmurs, gallops, rubs, or thrills RES:  Clear to auscultation bilaterally ABD:  Soft, nontender, nondistended, positive bowel sounds x 4 VASC:  +2 radial  pulses, +2 carotid pulses NEURO:  CN 2-12 grossly intact; motor and sensory grossly intact PSYCH:  No active depression or anxiety EXT:  No edema, ecchymosis, or cyanosis  Signed, Orbie Pyo, MD  07/02/2023 4:57 PM    [facility] [address] [place], [place], [place]  [address] Phone: [phone]; Fax: [phone]   Note:  This document was prepared using Dragon voice recognition software and may include unintentional dictation errors.

## 2023-07-02 ENCOUNTER — Encounter: Payer: Self-pay | Admitting: Internal Medicine

## 2023-07-02 ENCOUNTER — Telehealth: Payer: Self-pay | Admitting: *Deleted

## 2023-07-02 ENCOUNTER — Ambulatory Visit: Attending: Internal Medicine | Admitting: Internal Medicine

## 2023-07-02 VITALS — BP 160/80 | HR 72 | Resp 16 | Ht 65.0 in | Wt 182.8 lb

## 2023-07-02 DIAGNOSIS — N183 Chronic kidney disease, stage 3 unspecified: Secondary | ICD-10-CM

## 2023-07-02 DIAGNOSIS — I152 Hypertension secondary to endocrine disorders: Secondary | ICD-10-CM | POA: Diagnosis not present

## 2023-07-02 DIAGNOSIS — E1159 Type 2 diabetes mellitus with other circulatory complications: Secondary | ICD-10-CM

## 2023-07-02 DIAGNOSIS — I288 Other diseases of pulmonary vessels: Secondary | ICD-10-CM | POA: Diagnosis not present

## 2023-07-02 DIAGNOSIS — E785 Hyperlipidemia, unspecified: Secondary | ICD-10-CM | POA: Diagnosis not present

## 2023-07-02 DIAGNOSIS — E1122 Type 2 diabetes mellitus with diabetic chronic kidney disease: Secondary | ICD-10-CM

## 2023-07-02 DIAGNOSIS — E7841 Elevated Lipoprotein(a): Secondary | ICD-10-CM

## 2023-07-02 DIAGNOSIS — E119 Type 2 diabetes mellitus without complications: Secondary | ICD-10-CM | POA: Diagnosis not present

## 2023-07-02 DIAGNOSIS — I3139 Other pericardial effusion (noninflammatory): Secondary | ICD-10-CM

## 2023-07-02 DIAGNOSIS — I700 Atherosclerosis of aorta: Secondary | ICD-10-CM

## 2023-07-02 DIAGNOSIS — Z6833 Body mass index (BMI) 33.0-33.9, adult: Secondary | ICD-10-CM

## 2023-07-02 DIAGNOSIS — E1169 Type 2 diabetes mellitus with other specified complication: Secondary | ICD-10-CM | POA: Diagnosis not present

## 2023-07-02 MED ORDER — NEBIVOLOL HCL 10 MG PO TABS: 10.0000 mg | ORAL_TABLET | Freq: Every day | ORAL | 3 refills | Status: DC

## 2023-07-02 NOTE — Patient Instructions (Addendum)
 Medication Instructions:  Your physician has recommended you make the following change in your medication:  1.) increase Bystolic to 10 mg - one tablet daily  *If you need a refill on your cardiac medications before your next appointment, please call your pharmacy*  Lab Work: Today: crp If you have labs (blood work) drawn today and your tests are completely normal, you will receive your results only by: MyChart Message (if you have MyChart) OR A paper copy in the mail If you have any lab test that is abnormal or we need to change your treatment, we will call you to review the results.  Testing/Procedures: Your physician has recommended that you have a sleep study. This test records several body functions during sleep, including: brain activity, eye movement, oxygen and carbon dioxide blood levels, heart rate and rhythm, breathing rate and rhythm, the flow of air through your mouth and nose, snoring, body muscle movements, and chest and belly movement.  Your physician has requested that you have a renal artery duplex. During this test, an ultrasound is used to evaluate blood flow to the kidneys. Allow one hour for this exam. Do not eat after midnight the day before and avoid carbonated beverages. Take your medications as you usually do.   Follow-Up: At [facility], you and your health needs are our priority.  As part of our continuing mission to provide you with exceptional heart care, our providers are all part of one team.  This team includes your primary Cardiologist (physician) and Advanced Practice Providers or APPs (Physician Assistants and Nurse Practitioners) who all work together to provide you with the care you need, when you need it.  Your next appointment:   6 month(s)  Provider:   Tereso Newcomer, PA-C        Other Instructions You have been referred to clinical pharmacy team for blood pressure    1st Floor: - Lobby - Registration  - Pharmacy  - Lab -  Cafe  2nd Floor: - PV Lab - Diagnostic Testing (echo, CT, nuclear med)  3rd Floor: - Vacant  4th Floor: - TCTS (cardiothoracic surgery) - AFib Clinic - Structural Heart Clinic - Vascular Surgery  - Vascular Ultrasound  5th Floor: - [facility] Cardiology (general and EP) - Clinical Pharmacy for coumadin, hypertension, lipid, weight-loss medications, and med management appointments    Valet parking services will be available as well.

## 2023-07-02 NOTE — Telephone Encounter (Signed)
 Patient agreement reviewed and signed on 07/02/2023.  WatchPAT issued to patient on 07/02/2023 by Danielle Rankin, CMA. Patient aware to not open the WatchPAT box until contacted with the activation PIN. Patient profile initialized in CloudPAT on 07/02/2023 by Danielle Rankin, CMA. Device serial number: [id number]  Please list Reason for Call as Advice Only and type "WatchPAT issued to patient" in the comment box.

## 2023-07-03 ENCOUNTER — Encounter: Payer: Self-pay | Admitting: Internal Medicine

## 2023-07-03 LAB — C-REACTIVE PROTEIN: CRP: <1 mg/L (ref 0–10)

## 2023-07-07 DIAGNOSIS — H401131 Primary open-angle glaucoma, bilateral, mild stage: Secondary | ICD-10-CM | POA: Diagnosis not present

## 2023-07-17 ENCOUNTER — Ambulatory Visit (HOSPITAL_COMMUNITY)
Admission: RE | Admit: 2023-07-17 | Discharge: 2023-07-17 | Disposition: A | Discharge status: Home / Self Care | Source: Ambulatory Visit | Attending: Cardiology | Admitting: Cardiology

## 2023-07-17 DIAGNOSIS — E1159 Type 2 diabetes mellitus with other circulatory complications: Secondary | ICD-10-CM | POA: Diagnosis not present

## 2023-07-17 DIAGNOSIS — I152 Hypertension secondary to endocrine disorders: Secondary | ICD-10-CM | POA: Diagnosis not present

## 2023-07-18 ENCOUNTER — Encounter: Payer: Self-pay | Admitting: Internal Medicine

## 2023-07-21 ENCOUNTER — Telehealth: Payer: Self-pay | Admitting: Internal Medicine

## 2023-07-21 NOTE — Telephone Encounter (Signed)
 Spoke with patient and she states she needs refill on semaglutide . She would like a call back if she is supposed to stop taking the medication

## 2023-07-21 NOTE — Telephone Encounter (Signed)
 Pt c/o medication issue:  1. Name of Medication: Semaglutide , 1 MG/DOSE, 4 MG/3ML SOPN   2. How are you currently taking this medication (dosage and times per day)?   Inject 1 mg into the skin once a week.    3. Are you having a reaction (difficulty breathing--STAT)? No  4. What is your medication issue? Patient is requesting a call back from our pharmacist Nickola Baron. Patient would like to know if Vaishali Patel would like the patient to continue taking this medication. Please advise.

## 2023-07-22 MED ORDER — SEMAGLUTIDE (1 MG/DOSE) 4 MG/3ML ~~LOC~~ SOPN: 1.0000 mg | PEN_INJECTOR | SUBCUTANEOUS | 1 refills | Status: DC

## 2023-07-22 NOTE — Telephone Encounter (Signed)
 Patient would like to stay on 1mg  for another month or so. She has follow up in June and will discuss increasing at that time.

## 2023-07-30 ENCOUNTER — Telehealth: Payer: Self-pay | Admitting: Pharmacist

## 2023-07-30 DIAGNOSIS — I10 Essential (primary) hypertension: Secondary | ICD-10-CM | POA: Diagnosis not present

## 2023-07-30 DIAGNOSIS — I452 Bifascicular block: Secondary | ICD-10-CM | POA: Diagnosis not present

## 2023-07-30 DIAGNOSIS — Z1159 Encounter for screening for other viral diseases: Secondary | ICD-10-CM | POA: Diagnosis not present

## 2023-07-30 DIAGNOSIS — E261 Secondary hyperaldosteronism: Secondary | ICD-10-CM | POA: Diagnosis not present

## 2023-07-30 DIAGNOSIS — E7800 Pure hypercholesterolemia, unspecified: Secondary | ICD-10-CM | POA: Diagnosis not present

## 2023-07-30 DIAGNOSIS — N183 Chronic kidney disease, stage 3 unspecified: Secondary | ICD-10-CM | POA: Diagnosis not present

## 2023-07-30 DIAGNOSIS — E1122 Type 2 diabetes mellitus with diabetic chronic kidney disease: Secondary | ICD-10-CM | POA: Diagnosis not present

## 2023-07-30 DIAGNOSIS — I288 Other diseases of pulmonary vessels: Secondary | ICD-10-CM | POA: Diagnosis not present

## 2023-07-30 DIAGNOSIS — I700 Atherosclerosis of aorta: Secondary | ICD-10-CM | POA: Diagnosis not present

## 2023-07-30 MED ORDER — OZEMPIC (2 MG/DOSE) 8 MG/3ML ~~LOC~~ SOPN: 2.0000 mg | PEN_INJECTOR | SUBCUTANEOUS | 6 refills | Status: AC

## 2023-07-30 NOTE — Telephone Encounter (Signed)
 Call to titrate Ozempic  dose to 2 mg, patient still has 3 more doses of 1 mg she will use it up and will go on 2 mg dose. Tolerates 1 mg dose well. Weight loss 4.7% ( initial weight 239 lbs and current weight 279 lbs) encourage to do more physical activity to get more weight loss benefit.

## 2023-08-08 ENCOUNTER — Ambulatory Visit: Payer: Medicare Other | Admitting: Podiatry

## 2023-08-11 ENCOUNTER — Encounter: Payer: Self-pay | Admitting: Podiatry

## 2023-08-11 ENCOUNTER — Ambulatory Visit: Admitting: Podiatry

## 2023-08-11 DIAGNOSIS — B351 Tinea unguium: Secondary | ICD-10-CM | POA: Diagnosis not present

## 2023-08-11 DIAGNOSIS — N1831 Chronic kidney disease, stage 3a: Secondary | ICD-10-CM

## 2023-08-11 DIAGNOSIS — M79675 Pain in left toe(s): Secondary | ICD-10-CM | POA: Diagnosis not present

## 2023-08-11 DIAGNOSIS — M79674 Pain in right toe(s): Secondary | ICD-10-CM | POA: Diagnosis not present

## 2023-08-11 DIAGNOSIS — E1122 Type 2 diabetes mellitus with diabetic chronic kidney disease: Secondary | ICD-10-CM

## 2023-08-11 NOTE — Progress Notes (Signed)
 This patient returns to my office for at risk foot care.  This patient requires this care by a professional since this patient will be at risk due to having diabetes and CKD.  This patient is unable to cut nails herself since the patient cannot reach hernails.These nails are painful walking and wearing shoes.  She also has a painful callus left forefoot.This patient presents for at risk foot care today.  General Appearance  Alert, conversant and in no acute stress.  Vascular  Dorsalis pedis and posterior tibial  pulses are palpable  bilaterally.  Capillary return is within normal limits  bilaterally. Temperature is within normal limits  bilaterally.  Neurologic  Senn-Weinstein monofilament wire test within normal limits  bilaterally. Muscle power within normal limits bilaterally.  Nails Thick disfigured discolored nails with subungual debris  from hallux to fifth toes bilaterally. No evidence of bacterial infection or drainage bilaterally.  Orthopedic  No limitations of motion  feet .  No crepitus or effusions noted.  No bony pathology or digital deformities noted.  Skin  normotropic skin noted bilaterally.  No signs of infections or ulcers noted.   Porokeratosis sub 3 left foot   Onychomycosis  Pain in right toes  Pain in left toes  Consent was obtained for treatment procedures.   Mechanical debridement of nails 1-5  bilaterally performed with a nail nipper.  Filed with dremel without incident.     Return office visit   3 months                   Told patient to return for periodic foot care and evaluation due to potential at risk complications.   Ruffin Cotton DPM

## 2023-08-19 ENCOUNTER — Other Ambulatory Visit: Payer: Self-pay | Admitting: *Deleted

## 2023-08-19 DIAGNOSIS — E785 Hyperlipidemia, unspecified: Secondary | ICD-10-CM

## 2023-08-21 NOTE — Progress Notes (Unsigned)
 [facility] [address] [place] [address] Phone: [phone] Subjective:   Jade Sullivan, am serving as a scribe for Dr. Ronnell Coins.  I'm seeing this patient by the request  of:  Darnelle Elders, PA-C  CC: Bilateral knee pain  VHQ:[id number]  05/27/2023 Following up with cardiology seems to be having some labile aspects of it.  Regular rate and rhythm on the heart exam today.     Given injection today with improvement in range of motion noted.  Discussed icing regimen and home exercises.  Discussed which activities to do and which ones to avoid.  Increase activity slowly.  Follow-up again in 6 to 8 weeks.     Chronic problem with worsening symptoms.  Discussed icing regimen and home exercises, patient still wants to avoid any surgical intervention.  Does have discussed different bracing.  Discussed continuing to stay active where possible.  Follow-up again in 6 to 12 weeks otherwise.      Update 08/26/2023 Jade Sullivan is a 78 y.o. female coming in with complaint of R shoulder and B knee pain. Durolane approved for both knees. Patient states that her knees are stiff and more painful with deep knee bending. Would like steroid   Shoulder is better following injection. Achy at times but much better.       Past Medical History:  Diagnosis Date   Allergy    seasonal allergies   Arthritis    bilateral knees   Cataract    not a surgical candidate at this time (05/29/2020)   Chronic kidney disease    COVID-19 04/2019   Diabetes mellitus without complication (HCC)    on meds   Diverticulosis of colon (without mention of hemorrhage)    Dysmetabolic syndrome X    Glaucoma    on meds   Gout    H/O hyperthyroidism    By patient h/o hyperthyroidism with protosis, s/p subtotal thyroidectomy. On no replacement therapy. Last thyroid  functions 2013 in [place]   Plan Will need thyroid  functions Feb '15 with recommendations to follow.      Heart murmur    hx of   Hyperlipidemia    on meds   Hypertension    on multiple meds   Hypothyroidism    on meds   Kidney lesion    On supplemental oxygen  by nasal cannula    at bedtime   Pancreatic cyst    Tubular adenoma of colon    Past Surgical History:  Procedure Laterality Date   BIOPSY  06/27/2020   Procedure: BIOPSY;  Surgeon: Nannette Babe, MD;  Location: [facility];  Service: Gastroenterology;;   CESAREAN SECTION  1969   COLONOSCOPY  2016   JMP-MAC-2 day suprep (good)-TICS/TA   COLONOSCOPY WITH PROPOFOL  N/A 06/27/2020   Procedure: COLONOSCOPY WITH PROPOFOL ;  Surgeon: Nannette Babe, MD;  Location: [facility];  Service: Gastroenterology;  Laterality: N/A;   DILATATION & CURETTAGE/HYSTEROSCOPY WITH MYOSURE N/A 04/15/2023   Procedure: DILATATION & CURETTAGE/HYSTEROSCOPY WITH MYOSURE Myomectomy and endometrial biopsy;  Surgeon: Romaine Closs, MD;  Location: [facility];  Service: Gynecology;  Laterality: N/A;   EYE SURGERY     LIPOMA EXCISION Right 11/24/2014   Procedure: EXCISION RIGHT SHOULDER LIPOMA;  Surgeon: Sim Dryer, MD;  Location: [facility];  Service: General;  Laterality: Right;   POLYPECTOMY  2016   TA   POLYPECTOMY  06/27/2020   Procedure: POLYPECTOMY;  Surgeon: Nannette Babe, MD;  Location: [facility];  Service: Gastroenterology;;   RIGHT HEART CATH N/A 06/03/2023   Procedure: RIGHT HEART CATH;  Surgeon: Kyra Phy, MD;  Location: [facility];  Service: Cardiovascular;  Laterality: N/A;   THYROIDECTOMY, PARTIAL  1981   TONSILLECTOMY     UMBILICAL HERNIA REPAIR  2000   WISDOM TOOTH EXTRACTION     Social History   Socioeconomic History   Marital status: Divorced    Spouse name: Not on file   Number of children: 1   Years of education: Not on file   Highest education level: Not on file  Occupational History   Occupation: Retired  Tobacco Use   Smoking status: Former   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   Vaping status: Never Used  Substance and Sexual Activity   Alcohol use: No    Alcohol/week: 0.0 standard drinks of alcohol   Drug use: No   Sexual activity: Not Currently    Partners: Male    Birth control/protection: Post-menopausal    Comment: 1st intercourse- 16, partners- 5, divorced  Other Topics Concern   Not on file  Social History Narrative   Divorced   Retired - Engineer, petroleum   Social Drivers of Corporate investment banker Strain: Low Risk  (10/10/2021)   Overall Financial Resource Strain (CARDIA)    Difficulty of Paying Living Expenses: Not hard at all  Food Insecurity: No Food Insecurity (12/20/2021)   Hunger Vital Sign    Worried About Running Out of Food in the Last Year: Never true    Ran Out of Food in the Last Year: Never true  Transportation Needs: No Transportation Needs (12/20/2021)   PRAPARE - Administrator, Civil Service (Medical): No    Lack of Transportation (Non-Medical): No  Physical Activity: Sufficiently Active (10/10/2021)   Exercise Vital Sign    Days of Exercise per Week: 5 days    Minutes of Exercise per Session: 30 min  Stress: No Stress Concern Present (10/10/2021)   [employer] of Occupational Health - Occupational Stress Questionnaire    Feeling of Stress : Not at all  Social Connections: Moderately Integrated (10/10/2021)   Social Connection and Isolation Panel [NHANES]    Frequency of Communication with Friends and Family: More than three times a week    Frequency of Social Gatherings with Friends and Family: Twice a week    Attends Religious Services: More than 4 times per year    Active Member of [employer] or Organizations: Yes    Attends Engineer, structural: More than 4 times per year    Marital Status: Divorced   Allergies  Allergen Reactions   Amlodipine     Gingival hyperplasia   Hctz [Hydrochlorothiazide] Other (See Comments)    gout   Jardiance  [Empagliflozin ] Other (See Comments)    Yeast  Infections   Family History  Problem Relation Age of Onset   Diabetes Mother        DM2   Hypertension Mother    Pneumonia Mother        died from this condition   Colon polyps Mother    Heart disease Father        died from this condition   Colon polyps Father    Kidney disease Sister        HBP and DM2; relative died from this condition   Colon polyps Sister    Heart disease Brother  MI - HBP and DM2   Colon polyps Brother    Colon polyps Brother     Current Outpatient Medications (Endocrine & Metabolic):    levothyroxine  (SYNTHROID ) 75 MCG tablet, Take 75-112.5 mcg by mouth See admin instructions. Take 112.5 mg on Mondays and Wednesdays, Take 75 mcg daily on all other days   metFORMIN  (GLUCOPHAGE ) 500 MG tablet, Take 500 mg by mouth daily.   Semaglutide , 2 MG/DOSE, (OZEMPIC , 2 MG/DOSE,) 8 MG/3ML SOPN, Inject 2 mg into the skin once a week.  Current Outpatient Medications (Cardiovascular):    atorvastatin  (LIPITOR) 40 MG tablet, Take 1 tablet (40 mg total) by mouth daily.   cloNIDine  (CATAPRES  - DOSED IN MG/24 HR) 0.3 mg/24hr patch, Place 0.3 mg onto the skin every Wednesday.   cloNIDine  (CATAPRES ) 0.2 MG tablet, Take 1 tablet (0.2 mg total) by mouth 3 (three) times daily.   irbesartan  (AVAPRO ) 300 MG tablet, TAKE 1 TABLET(300 MG) BY MOUTH IN THE MORNING   minoxidil  (LONITEN ) 2.5 MG tablet, Take 5 mg by mouth 3 (three) times daily.   nebivolol  (BYSTOLIC ) 10 MG tablet, Take 1 tablet (10 mg total) by mouth at bedtime.   REPATHA  SURECLICK 140 MG/ML SOAJ, ADMINISTER 1 ML UNDER THE SKIN EVERY 14 DAYS   spironolactone  (ALDACTONE ) 100 MG tablet, Take 100 mg by mouth every evening.   Current Outpatient Medications (Analgesics):    aspirin EC 81 MG tablet, Take 81 mg by mouth every evening. Swallow whole.   aspirin-acetaminophen -caffeine (EXCEDRIN MIGRAINE) 250-250-65 MG tablet, Take 1-2 tablets by mouth every 6 (six) hours as needed for headache (sinus  headaches/pain).  Current Outpatient Medications (Hematological):    vitamin B-12 (CYANOCOBALAMIN) 500 MCG tablet, Take 500 mcg by mouth 2 (two) times daily.  Current Outpatient Medications (Other):    ALPRAZolam  (XANAX ) 0.25 MG tablet, Take 1 tablet (0.25 mg total) by mouth 2 (two) times daily as needed for anxiety (prior to dentist).   Cholecalciferol (VITAMIN D3 MAXIMUM STRENGTH) 125 MCG (5000 UT) capsule, Take 5,000 Units by mouth daily.   fish oil-omega-3 fatty acids 1000 MG capsule, Take 1 g by mouth daily.   gabapentin  (NEURONTIN ) 100 MG capsule, Take 2 capsules (200 mg total) by mouth at bedtime. (Patient taking differently: Take 100 mg by mouth 2 (two) times daily as needed (pain).)   latanoprost  (XALATAN ) 0.005 % ophthalmic solution, Place 1 drop into both eyes at bedtime.    Multiple Vitamin (MULTIVITAMIN WITH MINERALS) TABS tablet, Take 2 tablets by mouth every evening.   timolol  (TIMOPTIC ) 0.5 % ophthalmic solution, Place 1 drop into both eyes 2 (two) times daily.   Reviewed prior external information including notes and imaging from  primary care provider As well as notes that were available from care everywhere and other healthcare systems.  Past medical history, social, surgical and family history all reviewed in electronic medical record.  No pertanent information unless stated regarding to the chief complaint.   Review of Systems:  No headache, visual changes, nausea, vomiting, diarrhea, constipation, dizziness, abdominal pain, skin rash, fevers, chills, night sweats, weight loss, swollen lymph nodes, body aches, joint swelling, chest pain, shortness of breath, mood changes. POSITIVE muscle aches  Objective  Blood pressure 118/86, pulse 71, height 5\' 5"  (1.651 m), weight 187 lb (84.8 kg), SpO2 96%.   General: No apparent distress alert and oriented x3 mood and affect normal, dressed appropriately.  HEENT: Pupils equal, extraocular movements intact  Respiratory:  Patient's speak in full sentences and does not appear  short of breath  Cardiovascular: No lower extremity edema, non tender, no erythema  Patient does have fairly significant arthritic changes of the knees.  Trace effusion noted with some limited range of motion.  After informed written and verbal consent, patient was seated on exam table. Right knee was prepped with alcohol swab and utilizing anterolateral approach, patient's right knee space was injected with 4:1  marcaine  0.5%: Kenalog  40mg /dL. Patient tolerated the procedure well without immediate complications.  After informed written and verbal consent, patient was seated on exam table. Left knee was prepped with alcohol swab and utilizing anterolateral approach, patient's left knee space was injected with 4:1  marcaine  0.5%: Kenalog  40mg /dL. Patient tolerated the procedure well without immediate complications.   Impression and Recommendations:    The above documentation has been reviewed and is accurate and complete Rick Warnick M Gino Garrabrant, DO

## 2023-08-26 ENCOUNTER — Ambulatory Visit: Payer: Medicare Other | Admitting: Family Medicine

## 2023-08-26 ENCOUNTER — Encounter: Payer: Self-pay | Admitting: Family Medicine

## 2023-08-26 VITALS — BP 118/86 | HR 71 | Ht 65.0 in | Wt 187.0 lb

## 2023-08-26 DIAGNOSIS — M170 Bilateral primary osteoarthritis of knee: Secondary | ICD-10-CM

## 2023-08-26 NOTE — Assessment & Plan Note (Signed)
 Chronic problem with worsening symptoms.  Repeat injections.  Secondary to patient's other comorbidities including gout and chronic kidney disease as well as diabetes it seems to be the safest aspect.  Discussed with patient about icing regimen and home exercises.  Do not want to add any medications with patient continuing to have some difficulty with the heart.  Social determinants of health includes patient was a former smoker.  Follow-up again in 6 to 8 weeks

## 2023-08-26 NOTE — Patient Instructions (Signed)
Injected both knees today See me in 2-3 months 

## 2023-09-01 ENCOUNTER — Other Ambulatory Visit: Payer: Self-pay | Admitting: Family Medicine

## 2023-09-02 ENCOUNTER — Ambulatory Visit: Attending: Cardiology | Admitting: Pharmacist

## 2023-09-02 ENCOUNTER — Encounter: Payer: Self-pay | Admitting: Pharmacist

## 2023-09-02 VITALS — BP 125/79 | HR 74 | Ht 65.0 in | Wt 180.0 lb

## 2023-09-02 DIAGNOSIS — I10 Essential (primary) hypertension: Secondary | ICD-10-CM | POA: Insufficient documentation

## 2023-09-02 DIAGNOSIS — I1A0 Resistant hypertension: Secondary | ICD-10-CM | POA: Diagnosis not present

## 2023-09-02 NOTE — Assessment & Plan Note (Signed)
 Assessment: BP is controlled in office BP 125/79 mmHg heart rate 74 (goal <130/80) Takes and tolerates BP meds well without any side effects  Denies SOB, palpitation, chest pain, headaches,or swelling Follows low salt diet and will be re-starting walking every other day for 30 min  Plan:  Given at goal BP no medications changes  Continue taking clonidine  0.3 mg patch every 7 days, clonidine  0.2 mg 3 times daily, irbesartan  300 mg daily, minoxidil  5 mg three times daily , nebivolol  10 mg at bedtime , and spironolactone  100 mg daily Patient to keep record of BP readings with heart rate and report to us  at the next visit Patient to bring home BP monitor for validation  Patient to see PharmD in 4 weeks for follow up  Follow up lab(s): none

## 2023-09-02 NOTE — Progress Notes (Signed)
 Patient ID: Jade Sullivan                 DOB: 06/18/1945                      MRN: [id number]      HPI: Jade Sullivan is a 78 y.o. female referred by Dr. Lorie Rook  to HTN clinic. PMH is significant for T2DM, hypertension, HLD, elevated Lpa, CKD stage 3. Aortic atherosclerosis  Renal ultra sound showed moderate blockage ion one of the kidney arteries.   Patient reports feeling well and notes a weight loss of approximately 13 lbs since starting GLP-1 therapy. She states she feels great overall. Blood pressure is not monitored frequently at home, but she checked it this morning and reports a reading of 97/63. She does not recall the heart rate or additional BP readings from other days .  Denies any symptoms of concern including swelling, dizziness, headache, shortness of breath, or palpitations. Reports taking her medications regularly.  She continues to follow a low-salt, home-cooked diet and is planning to restart regular outdoor walks--30 minutes every other day.   Current HTN meds: clonidine  0.3 mg patch every 7 days, clonidine  0.2 mg 3 times daily, irbesartan  300 mg daily, minoxidil  5 mg three times daily , nebivolol  10 mg at bedtime , and spironolactone  100 mg daily.   Previously tried: amlodipine - gingival hyperplasia and hydrochlorothiazide- gout  BP goal: <130/80 Last lab: Cr Cl - 27 mL/min  Family History:  Relation Problem Comments  Mother (Deceased) Colon polyps   Diabetes DM2  Hypertension   Pneumonia died from this condition    Father (Deceased) Colon polyps   Heart disease died from this condition    Sister (Deceased) Colon polyps   Kidney disease HBP and DM2; relative died from this condition    Brother (Deceased) Colon polyps   Heart disease MI - HBP and DM2    Brother (Deceased) Colon polyps     Social History:  Alcohol: none  Smoking: none   Diet: Patient follows a low-salt, home-cooked diet and avoids eating out.  Fluids: On restricted water intake  due to CKD.  Exercise: Plans to begin a regular walking routine--30-minute walks every other day.     Wt Readings from Last 3 Encounters:  09/02/23 180 lb (81.6 kg)  08/26/23 187 lb (84.8 kg)  07/02/23 182 lb 12.8 oz (82.9 kg)   BP Readings from Last 3 Encounters:  09/02/23 125/79  08/26/23 118/86  07/02/23 (!) 160/80   Pulse Readings from Last 3 Encounters:  09/02/23 74  08/26/23 71  07/02/23 72    Renal function: CrCl cannot be calculated (Patient's most recent lab result is older than the maximum 21 days allowed.).  Past Medical History:  Diagnosis Date   Allergy    seasonal allergies   Arthritis    bilateral knees   Cataract    not a surgical candidate at this time (05/29/2020)   Chronic kidney disease    COVID-19 04/2019   Diabetes mellitus without complication (HCC)    on meds   Diverticulosis of colon (without mention of hemorrhage)    Dysmetabolic syndrome X    Glaucoma    on meds   Gout    H/O hyperthyroidism    By patient h/o hyperthyroidism with protosis, s/p subtotal thyroidectomy. On no replacement therapy. Last thyroid  functions 2013 in [place]   Plan Will need thyroid  functions Feb '15 with recommendations to follow.  Heart murmur    hx of   Hyperlipidemia    on meds   Hypertension    on multiple meds   Hypothyroidism    on meds   Kidney lesion    On supplemental oxygen  by nasal cannula    at bedtime   Pancreatic cyst    Tubular adenoma of colon     Current Outpatient Medications on File Prior to Visit  Medication Sig Dispense Refill   ALPRAZolam  (XANAX ) 0.25 MG tablet Take 1 tablet (0.25 mg total) by mouth 2 (two) times daily as needed for anxiety (prior to dentist). 20 tablet 0   aspirin EC 81 MG tablet Take 81 mg by mouth every evening. Swallow whole.     aspirin-acetaminophen -caffeine (EXCEDRIN MIGRAINE) 250-250-65 MG tablet Take 1-2 tablets by mouth every 6 (six) hours as needed for headache (sinus headaches/pain).      atorvastatin  (LIPITOR) 40 MG tablet Take 1 tablet (40 mg total) by mouth daily. 90 tablet 3   Cholecalciferol (VITAMIN D3 MAXIMUM STRENGTH) 125 MCG (5000 UT) capsule Take 5,000 Units by mouth daily.     cloNIDine  (CATAPRES  - DOSED IN MG/24 HR) 0.3 mg/24hr patch Place 0.3 mg onto the skin every Wednesday.     cloNIDine  (CATAPRES ) 0.2 MG tablet Take 1 tablet (0.2 mg total) by mouth 3 (three) times daily.     fish oil-omega-3 fatty acids 1000 MG capsule Take 1 g by mouth daily.     gabapentin  (NEURONTIN ) 100 MG capsule TAKE 2 CAPSULES(200 MG) BY MOUTH AT BEDTIME 180 capsule 0   irbesartan  (AVAPRO ) 300 MG tablet TAKE 1 TABLET(300 MG) BY MOUTH IN THE MORNING 90 tablet 0   latanoprost  (XALATAN ) 0.005 % ophthalmic solution Place 1 drop into both eyes at bedtime.      levothyroxine  (SYNTHROID ) 75 MCG tablet Take 75-112.5 mcg by mouth See admin instructions. Take 112.5 mg on Mondays and Wednesdays, Take 75 mcg daily on all other days     metFORMIN  (GLUCOPHAGE ) 500 MG tablet Take 500 mg by mouth daily.     minoxidil  (LONITEN ) 2.5 MG tablet Take 5 mg by mouth 3 (three) times daily.     Multiple Vitamin (MULTIVITAMIN WITH MINERALS) TABS tablet Take 2 tablets by mouth every evening.     nebivolol  (BYSTOLIC ) 10 MG tablet Take 1 tablet (10 mg total) by mouth at bedtime. 90 tablet 3   REPATHA  SURECLICK 140 MG/ML SOAJ ADMINISTER 1 ML UNDER THE SKIN EVERY 14 DAYS 2 mL 11   Semaglutide , 2 MG/DOSE, (OZEMPIC , 2 MG/DOSE,) 8 MG/3ML SOPN Inject 2 mg into the skin once a week. 2 mL 6   spironolactone  (ALDACTONE ) 100 MG tablet Take 100 mg by mouth every evening.     timolol  (TIMOPTIC ) 0.5 % ophthalmic solution Place 1 drop into both eyes 2 (two) times daily.     vitamin B-12 (CYANOCOBALAMIN) 500 MCG tablet Take 500 mcg by mouth 2 (two) times daily.     No current facility-administered medications on file prior to visit.    Allergies  Allergen Reactions   Amlodipine     Gingival hyperplasia   Hctz  [Hydrochlorothiazide] Other (See Comments)    gout   Jardiance  [Empagliflozin ] Other (See Comments)    Yeast Infections    Blood pressure 125/79, pulse 74, height 5\' 5"  (1.651 m), weight 180 lb (81.6 kg), SpO2 100%.   Assessment/Plan:  1. Hypertension -  Hypertension Assessment: BP is controlled in office BP 125/79 mmHg heart rate 74 (goal <130/80)  Takes and tolerates BP meds well without any side effects  Denies SOB, palpitation, chest pain, headaches,or swelling Follows low salt diet and will be re-starting walking every other day for 30 min  Plan:  Given at goal BP no medications changes  Continue taking clonidine  0.3 mg patch every 7 days, clonidine  0.2 mg 3 times daily, irbesartan  300 mg daily, minoxidil  5 mg three times daily , nebivolol  10 mg at bedtime , and spironolactone  100 mg daily Patient to keep record of BP readings with heart rate and report to us  at the next visit Patient to bring home BP monitor for validation  Patient to see PharmD in 4 weeks for follow up  Follow up lab(s): none     Thank you  Nickola Baron, Pharm.D [place] [facility] Mom. [facility] [address], [place], [place] [address] Phone: [phone]; Fax: [phone]

## 2023-09-02 NOTE — Patient Instructions (Signed)
 No Changes made by your pharmacist Nickola Baron, PharmD at today's visit:   Bring all of your meds, your BP cuff and your record of home blood pressures to your next appointment.    HOW TO TAKE YOUR BLOOD PRESSURE AT HOME  Rest 5 minutes before taking your blood pressure.  Don't smoke or drink caffeinated beverages for at least 30 minutes before. Take your blood pressure before (not after) you eat. Sit comfortably with your back supported and both feet on the floor (don't cross your legs). Elevate your arm to heart level on a table or a desk. Use the proper sized cuff. It should fit smoothly and snugly around your bare upper arm. There should be enough room to slip a fingertip under the cuff. The bottom edge of the cuff should be 1 inch above the crease of the elbow. Ideally, take 3 measurements at one sitting and record the average.  Important lifestyle changes to control high blood pressure  Intervention  Effect on the BP  Lose extra pounds and watch your waistline Weight loss is one of the most effective lifestyle changes for controlling blood pressure. If you're overweight or obese, losing even a small amount of weight can help reduce blood pressure. Blood pressure might go down by about 1 millimeter of mercury (mm Hg) with each kilogram (about 2.2 pounds) of weight lost.  Exercise regularly As a general goal, aim for at least 30 minutes of moderate physical activity every day. Regular physical activity can lower high blood pressure by about 5 to 8 mm Hg.  Eat a healthy diet Eating a diet rich in whole grains, fruits, vegetables, and low-fat dairy products and low in saturated fat and cholesterol. A healthy diet can lower high blood pressure by up to 11 mm Hg.  Reduce salt (sodium) in your diet Even a small reduction of sodium in the diet can improve heart health and reduce high blood pressure by about 5 to 6 mm Hg.  Limit alcohol One drink equals 12 ounces of beer, 5 ounces of wine,  or 1.5 ounces of 80-proof liquor.  Limiting alcohol to less than one drink a day for women or two drinks a day for men can help lower blood pressure by about 4 mm Hg.   If you have any questions or concerns please use My Chart to send questions or call the office at [phone]

## 2023-09-09 ENCOUNTER — Telehealth: Payer: Self-pay

## 2023-09-09 NOTE — Telephone Encounter (Signed)
 Ordering provider: Dr. 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 Authorization: Per [facility] Provider Portal Prior Authorization/Notification is not required for the requested service(s): Decision ID #: [id number] Patient notified of PIN (1234) on 09/09/2023 via Notification Method: phone.  Phone note routed to covering staff for follow-up.

## 2023-09-23 ENCOUNTER — Telehealth: Payer: Self-pay | Admitting: Internal Medicine

## 2023-09-23 NOTE — Telephone Encounter (Signed)
 New Message:    Patient says she is supposed to have a Sleep Study. She says she have some questions.

## 2023-09-27 ENCOUNTER — Encounter: Payer: Self-pay | Admitting: Cardiology

## 2023-09-27 ENCOUNTER — Encounter (INDEPENDENT_AMBULATORY_CARE_PROVIDER_SITE_OTHER): Admitting: Cardiology

## 2023-09-27 DIAGNOSIS — I1A0 Resistant hypertension: Secondary | ICD-10-CM

## 2023-09-27 DIAGNOSIS — G4733 Obstructive sleep apnea (adult) (pediatric): Secondary | ICD-10-CM | POA: Diagnosis not present

## 2023-09-27 DIAGNOSIS — I288 Other diseases of pulmonary vessels: Secondary | ICD-10-CM

## 2023-09-29 DIAGNOSIS — E1122 Type 2 diabetes mellitus with diabetic chronic kidney disease: Secondary | ICD-10-CM | POA: Diagnosis not present

## 2023-09-29 DIAGNOSIS — H401131 Primary open-angle glaucoma, bilateral, mild stage: Secondary | ICD-10-CM | POA: Diagnosis not present

## 2023-09-29 DIAGNOSIS — E890 Postprocedural hypothyroidism: Secondary | ICD-10-CM | POA: Diagnosis not present

## 2023-09-29 DIAGNOSIS — N183 Chronic kidney disease, stage 3 unspecified: Secondary | ICD-10-CM | POA: Diagnosis not present

## 2023-10-02 ENCOUNTER — Encounter: Payer: Self-pay | Admitting: Pharmacist

## 2023-10-02 ENCOUNTER — Ambulatory Visit: Admitting: Pharmacist

## 2023-10-02 VITALS — BP 123/79 | HR 75

## 2023-10-02 DIAGNOSIS — I1A0 Resistant hypertension: Secondary | ICD-10-CM | POA: Diagnosis not present

## 2023-10-02 MED ORDER — CLONIDINE HCL 0.1 MG PO TABS: 0.1000 mg | ORAL_TABLET | Freq: Two times a day (BID) | ORAL | 11 refills | Status: DC

## 2023-10-02 NOTE — Assessment & Plan Note (Addendum)
 Assessment: BP is controlled in office BP 123/79 mmHg heart rate 75 (goal <130/80) Takes and tolerates BP meds well without any side effects  Denies SOB, palpitation, chest pain, headaches,or swelling Follows low salt diet and will be re-starting walking every other day for 30 min with her friend   Home BP monitor validated today found to be accurate, home BP ~ 115/75 heart rate 78  Plan:  Given too high clonidine  and anticholinergic burden will reduce clonidine  dose to 0.1 mg three times daily from 0.2 mg three times daily  Continue taking clonidine  0.3 mg patch every 7 days, irbesartan  300 mg daily, minoxidil  5 mg three times daily , nebivolol  10 mg at bedtime , and spironolactone  100 mg daily Patient to keep record of BP readings with heart rate and report to us  at the next visit Patient to see PharmD in 4 weeks for follow up  Follow up lab(s): none  In future if BP remains at goal plan is to taper off of oral clonidine  and switch nebivolol  to carvedilol for better BP control

## 2023-10-02 NOTE — Patient Instructions (Addendum)
 Changes made by your pharmacist [name] Blanch, PharmD at today's visit:    Instructions/Changes  (what do you need to do) Your Notes  (what you did and when you did it)  Reduce oral clonidine  dose to 0.1 mg three times daily from 0.2 mg three times daily    Continue taking current BP meds clonidine  0.3 mg patch every 7 days, irbesartan  300 mg daily, minoxidil  5 mg three times daily , nebivolol  10 mg at bedtime , and spironolactone  100 mg daily.    Keep watching salt     Bring all of your meds, your BP cuff and your record of home blood pressures to your next appointment.    HOW TO TAKE YOUR BLOOD PRESSURE AT HOME  Rest 5 minutes before taking your blood pressure.  Don't smoke or drink caffeinated beverages for at least 30 minutes before. Take your blood pressure before (not after) you eat. Sit comfortably with your back supported and both feet on the floor (don't cross your legs). Elevate your arm to heart level on a table or a desk. Use the proper sized cuff. It should fit smoothly and snugly around your bare upper arm. There should be enough room to slip a fingertip under the cuff. The bottom edge of the cuff should be 1 inch above the crease of the elbow. Ideally, take 3 measurements at one sitting and record the average.  Important lifestyle changes to control high blood pressure  Intervention  Effect on the BP  Lose extra pounds and watch your waistline Weight loss is one of the most effective lifestyle changes for controlling blood pressure. If you're overweight or obese, losing even a small amount of weight can help reduce blood pressure. Blood pressure might go down by about 1 millimeter of mercury (mm Hg) with each kilogram (about 2.2 pounds) of weight lost.  Exercise regularly As a general goal, aim for at least 30 minutes of moderate physical activity every day. Regular physical activity can lower high blood pressure by about 5 to 8 mm Hg.  Eat a healthy diet Eating a diet  rich in whole grains, fruits, vegetables, and low-fat dairy products and low in saturated fat and cholesterol. A healthy diet can lower high blood pressure by up to 11 mm Hg.  Reduce salt (sodium) in your diet Even a small reduction of sodium in the diet can improve heart health and reduce high blood pressure by about 5 to 6 mm Hg.  Limit alcohol One drink equals 12 ounces of beer, 5 ounces of wine, or 1.5 ounces of 80-proof liquor.  Limiting alcohol to less than one drink a day for women or two drinks a day for men can help lower blood pressure by about 4 mm Hg.   If you have any questions or concerns please use My Chart to send questions or call the office at [phone]

## 2023-10-02 NOTE — Progress Notes (Signed)
 Patient ID: Jade Sullivan                 DOB: 02/05/1946                      MRN: [id number]      HPI: Jade Sullivan is a 78 y.o. female referred by Dr. Wendel  to HTN clinic. PMH is significant for T2DM, hypertension, HLD, elevated Lpa, CKD stage 3. Aortic atherosclerosis  Renal ultra sound showed moderate blockage on one of the kidney arteries.   The patient presented for follow-up of hypertension. She reports checking her blood pressure twice daily, with average readings around 115/75 mmHg and a heart rate of 78 bpm. She tolerates her current medications well without any side effects. The patient brought all her medications and her blood pressure monitor for validation; her home readings are within 10 points of the clinic monitor. She reports feeling well and denies symptoms such as swelling, dizziness, headache, shortness of breath, or palpitations. She confirms regular medication adherence and notes that she avoids eating out. She plans to restart her exercise routine with a friend starting next Monday   Current HTN meds: clonidine  0.3 mg patch every 7 days, clonidine  0.1 mg 3 times daily, irbesartan  300 mg daily, minoxidil  5 mg three times daily , nebivolol  10 mg at bedtime , and spironolactone  100 mg daily.   Previously tried: amlodipine - gingival hyperplasia and hydrochlorothiazide- gout  BP goal: <130/80 Last lab: Cr Cl - 27 mL/min  Home monitor validated on 10/02/2023 reads within 10 points compared to office monitor  Family History:  Relation Problem Comments  Mother (Deceased) Colon polyps   Diabetes DM2  Hypertension   Pneumonia died from this condition    Father (Deceased) Colon polyps   Heart disease died from this condition    Sister (Deceased) Colon polyps   Kidney disease HBP and DM2; relative died from this condition    Brother (Deceased) Colon polyps   Heart disease MI - HBP and DM2    Brother (Deceased) Colon polyps     Social History:  Alcohol: none   Smoking: none   Diet: Patient follows a low-salt, home-cooked diet and avoids eating out.  Fluids: On restricted water intake due to CKD.  Exercise: Plans to begin a regular walking routine--30-minute walks every other day.     Wt Readings from Last 3 Encounters:  09/02/23 180 lb (81.6 kg)  08/26/23 187 lb (84.8 kg)  07/02/23 182 lb 12.8 oz (82.9 kg)   BP Readings from Last 3 Encounters:  10/02/23 123/79  09/02/23 125/79  08/26/23 118/86   Pulse Readings from Last 3 Encounters:  10/02/23 75  09/02/23 74  08/26/23 71    Renal function: CrCl cannot be calculated (Patient's most recent lab result is older than the maximum 21 days allowed.).  Past Medical History:  Diagnosis Date   Allergy    seasonal allergies   Arthritis    bilateral knees   Cataract    not a surgical candidate at this time (05/29/2020)   Chronic kidney disease    COVID-19 04/2019   Diabetes mellitus without complication (HCC)    on meds   Diverticulosis of colon (without mention of hemorrhage)    Dysmetabolic syndrome X    Glaucoma    on meds   Gout    H/O hyperthyroidism    By patient h/o hyperthyroidism with protosis, s/p subtotal thyroidectomy. On no replacement therapy.  Last thyroid  functions 2013 in [place]   Plan Will need thyroid  functions Feb '15 with recommendations to follow.     Heart murmur    hx of   Hyperlipidemia    on meds   Hypertension    on multiple meds   Hypothyroidism    on meds   Kidney lesion    On supplemental oxygen  by nasal cannula    at bedtime   Pancreatic cyst    Tubular adenoma of colon     Current Outpatient Medications on File Prior to Visit  Medication Sig Dispense Refill   ALPRAZolam  (XANAX ) 0.25 MG tablet Take 1 tablet (0.25 mg total) by mouth 2 (two) times daily as needed for anxiety (prior to dentist). 20 tablet 0   aspirin EC 81 MG tablet Take 81 mg by mouth every evening. Swallow whole.     aspirin-acetaminophen -caffeine (EXCEDRIN MIGRAINE)  250-250-65 MG tablet Take 1-2 tablets by mouth every 6 (six) hours as needed for headache (sinus headaches/pain).     atorvastatin  (LIPITOR) 40 MG tablet Take 1 tablet (40 mg total) by mouth daily. 90 tablet 3   Cholecalciferol (VITAMIN D3 MAXIMUM STRENGTH) 125 MCG (5000 UT) capsule Take 5,000 Units by mouth daily.     cloNIDine  (CATAPRES  - DOSED IN MG/24 HR) 0.3 mg/24hr patch Place 0.3 mg onto the skin every Wednesday.     fish oil-omega-3 fatty acids 1000 MG capsule Take 1 g by mouth daily.     gabapentin  (NEURONTIN ) 100 MG capsule TAKE 2 CAPSULES(200 MG) BY MOUTH AT BEDTIME 180 capsule 0   irbesartan  (AVAPRO ) 300 MG tablet TAKE 1 TABLET(300 MG) BY MOUTH IN THE MORNING 90 tablet 0   latanoprost  (XALATAN ) 0.005 % ophthalmic solution Place 1 drop into both eyes at bedtime.      levothyroxine  (SYNTHROID ) 75 MCG tablet Take 75-112.5 mcg by mouth See admin instructions. Take 112.5 mg on Mondays and Wednesdays, Take 75 mcg daily on all other days     metFORMIN  (GLUCOPHAGE ) 500 MG tablet Take 500 mg by mouth daily.     minoxidil  (LONITEN ) 2.5 MG tablet Take 5 mg by mouth 3 (three) times daily.     Multiple Vitamin (MULTIVITAMIN WITH MINERALS) TABS tablet Take 2 tablets by mouth every evening.     nebivolol  (BYSTOLIC ) 10 MG tablet Take 1 tablet (10 mg total) by mouth at bedtime. 90 tablet 3   REPATHA  SURECLICK 140 MG/ML SOAJ ADMINISTER 1 ML UNDER THE SKIN EVERY 14 DAYS 2 mL 11   Semaglutide , 2 MG/DOSE, (OZEMPIC , 2 MG/DOSE,) 8 MG/3ML SOPN Inject 2 mg into the skin once a week. 2 mL 6   spironolactone  (ALDACTONE ) 100 MG tablet Take 100 mg by mouth every evening.     timolol  (TIMOPTIC ) 0.5 % ophthalmic solution Place 1 drop into both eyes 2 (two) times daily.     vitamin B-12 (CYANOCOBALAMIN) 500 MCG tablet Take 500 mcg by mouth 2 (two) times daily.     No current facility-administered medications on file prior to visit.    Allergies  Allergen Reactions   Amlodipine     Gingival hyperplasia   Hctz  [Hydrochlorothiazide] Other (See Comments)    gout   Jardiance  [Empagliflozin ] Other (See Comments)    Yeast Infections    Blood pressure 123/79, pulse 75.   Assessment/Plan:  1. Hypertension -  Hypertension Assessment: BP is controlled in office BP 123/79 mmHg heart rate 75 (goal <130/80) Takes and tolerates BP meds well without any side effects  Denies SOB, palpitation, chest pain, headaches,or swelling Follows low salt diet and will be re-starting walking every other day for 30 min with her friend   Home BP monitor validated today found to be accurate, home BP ~ 115/75 heart rate 78  Plan:  Given too high clonidine  and anticholinergic burden will reduce clonidine  dose to 0.1 mg three times daily from 0.2 mg three times daily  Continue taking clonidine  0.3 mg patch every 7 days, irbesartan  300 mg daily, minoxidil  5 mg three times daily , nebivolol  10 mg at bedtime , and spironolactone  100 mg daily Patient to keep record of BP readings with heart rate and report to us  at the next visit Patient to see PharmD in 4 weeks for follow up  Follow up lab(s): none  In future if BP remains at goal plan is to taper off of oral clonidine  and switch nebivolol  to carvedilol for better BP control     Thank you  [name] Blanch, Pharm.D [place] [facility]. [facility] [address], [place], [place] [address] Phone: [phone]; Fax: [phone]

## 2023-10-06 ENCOUNTER — Telehealth: Payer: Self-pay

## 2023-10-06 NOTE — Telephone Encounter (Signed)
 Notified patient of sleep study results and recommendations. All questions were answered and patient verbalized understanding. Our scheduler will contact patient for an OV to discuss treatment options.

## 2023-10-06 NOTE — Telephone Encounter (Signed)
 Called patient in Error with PIN for [facility] Sleep Study. Patient stated she has completed testing and is waiting on results. Notified patient one of sleep coordinator will return call with Provider recommendations.

## 2023-10-06 NOTE — Procedures (Addendum)
 Erroneous encounter

## 2023-10-06 NOTE — Telephone Encounter (Signed)
-----   Message from Wilbert Bihari sent at 10/06/2023  1:47 PM EDT ----- Patient has very mild OSA - set up OV to discuss treatment options.

## 2023-10-21 ENCOUNTER — Other Ambulatory Visit: Payer: Self-pay

## 2023-10-21 ENCOUNTER — Telehealth: Payer: Self-pay

## 2023-10-21 DIAGNOSIS — K862 Cyst of pancreas: Secondary | ICD-10-CM

## 2023-10-21 NOTE — Telephone Encounter (Signed)
 Check for mr/mrcp schedule

## 2023-10-21 NOTE — Telephone Encounter (Signed)
-----   Message from Nurse Rock DEL sent at 10/14/2022  2:32 PM EDT ----- Regarding: MRI Abd w w/o +MRCP Pt needs repeat imaging in 1 year.

## 2023-10-24 ENCOUNTER — Other Ambulatory Visit: Payer: Self-pay

## 2023-10-24 MED ORDER — LORAZEPAM 1 MG PO TABS: ORAL_TABLET | ORAL | 0 refills | Status: AC

## 2023-10-24 NOTE — Telephone Encounter (Signed)
 Patient requesting prescription for upcoming MRI for anxiety. Please advise.

## 2023-10-24 NOTE — Procedures (Addendum)
 Erroneous note placed.

## 2023-10-24 NOTE — Telephone Encounter (Signed)
Pt aware,script called into pharmacy.

## 2023-10-24 NOTE — Progress Notes (Signed)
 [facility] [address] [place] [address] Phone: [phone] Subjective:   LILLETTE Claretha Schimke am a scribe for Dr. Claudene.  I'm seeing this patient by the request  of:  Katina Pfeiffer, PA-C  CC: Bilateral knee pain  YEP:Dlagzrupcz  08/26/2023 Chronic problem with worsening symptoms.  Repeat injections.  Secondary to patient's other comorbidities including gout and chronic kidney disease as well as diabetes it seems to be the safest aspect.  Discussed with patient about icing regimen and home exercises.  Do not want to add any medications with patient continuing to have some difficulty with the heart.  Social determinants of health includes patient was a former smoker.  Follow-up again in 6 to 8 weeks    Update 10/27/2023 BAILLIE MOHAMMAD is a 78 y.o. female coming in with complaint of B knee pain. Patient states knees are doing pretty good. They are sore on the inside.  Mild instability but not stopping her significantly.  Does feel that is getting worse.      Past Medical History:  Diagnosis Date   Allergy    seasonal allergies   Arthritis    bilateral knees   Cataract    not a surgical candidate at this time (05/29/2020)   Chronic kidney disease    COVID-19 04/2019   Diabetes mellitus without complication (HCC)    on meds   Diverticulosis of colon (without mention of hemorrhage)    Dysmetabolic syndrome X    Glaucoma    on meds   Gout    H/O hyperthyroidism    By patient h/o hyperthyroidism with protosis, s/p subtotal thyroidectomy. On no replacement therapy. Last thyroid  functions 2013 in [place]   Plan Will need thyroid  functions Feb '15 with recommendations to follow.     Heart murmur    hx of   Hyperlipidemia    on meds   Hypertension    on multiple meds   Hypothyroidism    on meds   Kidney lesion    On supplemental oxygen  by nasal cannula    at bedtime   Pancreatic cyst    Tubular adenoma of colon    Past Surgical  History:  Procedure Laterality Date   BIOPSY  06/27/2020   Procedure: BIOPSY;  Surgeon: Albertus Gordy CHRISTELLA, MD;  Location: [facility];  Service: Gastroenterology;;   CESAREAN SECTION  1969   COLONOSCOPY  2016   JMP-MAC-2 day suprep (good)-TICS/TA   COLONOSCOPY WITH PROPOFOL  N/A 06/27/2020   Procedure: COLONOSCOPY WITH PROPOFOL ;  Surgeon: Albertus Gordy CHRISTELLA, MD;  Location: [facility];  Service: Gastroenterology;  Laterality: N/A;   DILATATION & CURETTAGE/HYSTEROSCOPY WITH MYOSURE N/A 04/15/2023   Procedure: DILATATION & CURETTAGE/HYSTEROSCOPY WITH MYOSURE Myomectomy and endometrial biopsy;  Surgeon: Dallie Vera GAILS, MD;  Location[facility];  Service: Gynecology;  Laterality: N/A;   EYE SURGERY     LIPOMA EXCISION Right 11/24/2014   Procedure: EXCISION RIGHT SHOULDER LIPOMA;  Surgeon: Debby Shipper, MD;  Location: [facility];  Service: General;  Laterality: Right;   POLYPECTOMY  2016   TA   POLYPECTOMY  06/27/2020   Procedure: POLYPECTOMY;  Surgeon: Albertus Gordy CHRISTELLA, MD;  Location: [facility];  Service: Gastroenterology;;   RIGHT HEART CATH N/A 06/03/2023   Procedure: RIGHT HEART CATH;  Surgeon: Wendel Lurena POUR, MD;  Location[facility];  Service: Cardiovascular;  Laterality: N/A;   THYROIDECTOMY, PARTIAL  1981   TONSILLECTOMY     UMBILICAL  HERNIA REPAIR  2000   WISDOM TOOTH EXTRACTION     Social History   Socioeconomic History   Marital status: Divorced    Spouse name: Not on file   Number of children: 1   Years of education: Not on file   Highest education level: Not on file  Occupational History   Occupation: Retired  Tobacco Use   Smoking status: Former   Smokeless tobacco: Never  Advertising account planner   Vaping status: Never Used  Substance and Sexual Activity   Alcohol use: No    Alcohol/week: 0.0 standard drinks of alcohol   Drug use: No   Sexual activity: Not Currently    Partners: Male    Birth control/protection: Post-menopausal    Comment:  1st intercourse- 16, partners- 5, divorced  Other Topics Concern   Not on file  Social History Narrative   Divorced   Retired - Engineer, petroleum   Social Drivers of Corporate investment banker Strain: Low Risk  (10/10/2021)   Overall Financial Resource Strain (CARDIA)    Difficulty of Paying Living Expenses: Not hard at all  Food Insecurity: No Food Insecurity (12/20/2021)   Hunger Vital Sign    Worried About Running Out of Food in the Last Year: Never true    Ran Out of Food in the Last Year: Never true  Transportation Needs: No Transportation Needs (12/20/2021)   PRAPARE - Administrator, Civil Service (Medical): No    Lack of Transportation (Non-Medical): No  Physical Activity: Sufficiently Active (10/10/2021)   Exercise Vital Sign    Days of Exercise per Week: 5 days    Minutes of Exercise per Session: 30 min  Stress: No Stress Concern Present (10/10/2021)   [employer] of Occupational Health - Occupational Stress Questionnaire    Feeling of Stress : Not at all  Social Connections: Moderately Integrated (10/10/2021)   Social Connection and Isolation Panel    Frequency of Communication with Friends and Family: More than three times a week    Frequency of Social Gatherings with Friends and Family: Twice a week    Attends Religious Services: More than 4 times per year    Active Member of [employer] or Organizations: Yes    Attends Engineer, structural: More than 4 times per year    Marital Status: Divorced   Allergies  Allergen Reactions   Amlodipine     Gingival hyperplasia   Hctz [Hydrochlorothiazide] Other (See Comments)    gout   Jardiance  [Empagliflozin ] Other (See Comments)    Yeast Infections   Family History  Problem Relation Age of Onset   Diabetes Mother        DM2   Hypertension Mother    Pneumonia Mother        died from this condition   Colon polyps Mother    Heart disease Father        died from this condition   Colon polyps Father     Kidney disease Sister        HBP and DM2; relative died from this condition   Colon polyps Sister    Heart disease Brother        MI - HBP and DM2   Colon polyps Brother    Colon polyps Brother     Current Outpatient Medications (Endocrine & Metabolic):    levothyroxine  (SYNTHROID ) 75 MCG tablet, Take 75-112.5 mcg by mouth See admin instructions. Take 112.5 mg on Mondays and Wednesdays,  Take 75 mcg daily on all other days   metFORMIN  (GLUCOPHAGE ) 500 MG tablet, Take 500 mg by mouth daily.   Semaglutide , 2 MG/DOSE, (OZEMPIC , 2 MG/DOSE,) 8 MG/3ML SOPN, Inject 2 mg into the skin once a week.  Current Outpatient Medications (Cardiovascular):    atorvastatin  (LIPITOR) 40 MG tablet, Take 1 tablet (40 mg total) by mouth daily.   cloNIDine  (CATAPRES  - DOSED IN MG/24 HR) 0.3 mg/24hr patch, Place 0.3 mg onto the skin every Wednesday.   cloNIDine  (CATAPRES ) 0.1 MG tablet, Take 1 tablet (0.1 mg total) by mouth 2 (two) times daily.   irbesartan  (AVAPRO ) 300 MG tablet, TAKE 1 TABLET(300 MG) BY MOUTH IN THE MORNING   minoxidil  (LONITEN ) 2.5 MG tablet, Take 5 mg by mouth 3 (three) times daily.   nebivolol  (BYSTOLIC ) 10 MG tablet, Take 1 tablet (10 mg total) by mouth at bedtime.   REPATHA  SURECLICK 140 MG/ML SOAJ, ADMINISTER 1 ML UNDER THE SKIN EVERY 14 DAYS   spironolactone  (ALDACTONE ) 100 MG tablet, Take 100 mg by mouth every evening.   Current Outpatient Medications (Analgesics):    aspirin EC 81 MG tablet, Take 81 mg by mouth every evening. Swallow whole.   aspirin-acetaminophen -caffeine (EXCEDRIN MIGRAINE) 250-250-65 MG tablet, Take 1-2 tablets by mouth every 6 (six) hours as needed for headache (sinus headaches/pain).  Current Outpatient Medications (Hematological):    vitamin B-12 (CYANOCOBALAMIN) 500 MCG tablet, Take 500 mcg by mouth 2 (two) times daily.  Current Outpatient Medications (Other):    ALPRAZolam  (XANAX ) 0.25 MG tablet, Take 1 tablet (0.25 mg total) by mouth 2 (two) times daily  as needed for anxiety (prior to dentist).   Cholecalciferol (VITAMIN D3 MAXIMUM STRENGTH) 125 MCG (5000 UT) capsule, Take 5,000 Units by mouth daily.   fish oil-omega-3 fatty acids 1000 MG capsule, Take 1 g by mouth daily.   gabapentin  (NEURONTIN ) 100 MG capsule, TAKE 2 CAPSULES(200 MG) BY MOUTH AT BEDTIME   latanoprost  (XALATAN ) 0.005 % ophthalmic solution, Place 1 drop into both eyes at bedtime.    LORazepam  (ATIVAN ) 1 MG tablet, Take tablet by mouth 1 hour prior to exam   Multiple Vitamin (MULTIVITAMIN WITH MINERALS) TABS tablet, Take 2 tablets by mouth every evening.   timolol  (TIMOPTIC ) 0.5 % ophthalmic solution, Place 1 drop into both eyes 2 (two) times daily.   Reviewed prior external information including notes and imaging from  primary care provider As well as notes that were available from care everywhere and other healthcare systems.  Past medical history, social, surgical and family history all reviewed in electronic medical record.  No pertanent information unless stated regarding to the chief complaint.   Review of Systems:  No headache, visual changes, nausea, vomiting, diarrhea, constipation, dizziness, abdominal pain, skin rash, fevers, chills, night sweats, weight loss, swollen lymph nodes, body aches, joint swelling, chest pain, shortness of breath, mood changes. POSITIVE muscle aches  Objective  Blood pressure (!) 140/70, pulse 76, height 5' 5 (1.651 m), weight 176 lb (79.8 kg), SpO2 (!) 88%.   General: No apparent distress alert and oriented x3 mood and affect normal, dressed appropriately.  HEENT: Pupils equal, extraocular movements intact  Respiratory: Patient's speak in full sentences and does not appear short of breath  Cardiovascular: No lower extremity edema, non tender, no erythema  Patient does have a effusion noted of the knees bilaterally.  No crepitus noted.  Instability with valgus and varus force.  After informed written and verbal consent, patient was  seated on exam table.  Right knee was prepped with alcohol swab and utilizing anterolateral approach, patient's right knee space was injected with 4:1  marcaine  0.5%: Kenalog  40mg /dL. Patient tolerated the procedure well without immediate complications.  After informed written and verbal consent, patient was seated on exam table. Left knee was prepped with alcohol swab and utilizing anterolateral approach, patient's left knee space was injected with 4:1  marcaine  0.5%: Kenalog  40mg /dL. Patient tolerated the procedure well without immediate complications.    Impression and Recommendations:     The above documentation has been reviewed and is accurate and complete Amiliana Foutz M Raysha Tilmon, DO

## 2023-10-24 NOTE — Telephone Encounter (Signed)
 Lorazepam  1 mg p.o. 30 minutes to 1 hour before MRI She will need a driver with sedation associated with lorazepam 

## 2023-10-24 NOTE — Telephone Encounter (Signed)
 Pt requesting med to help her get through her MRI. Please advise.

## 2023-10-27 ENCOUNTER — Encounter: Payer: Self-pay | Admitting: Family Medicine

## 2023-10-27 ENCOUNTER — Ambulatory Visit: Admitting: Family Medicine

## 2023-10-27 ENCOUNTER — Telehealth: Payer: Self-pay | Admitting: *Deleted

## 2023-10-27 VITALS — BP 140/70 | HR 76 | Ht 65.0 in | Wt 176.0 lb

## 2023-10-27 DIAGNOSIS — M170 Bilateral primary osteoarthritis of knee: Secondary | ICD-10-CM | POA: Diagnosis not present

## 2023-10-27 NOTE — Telephone Encounter (Signed)
 Shlomo Wilbert SAUNDERS, MD  P Cv Div Sleep Studies Please let patient know that they have sleep apnea with successful PAP titration.  PAP ordered placed in Epic.  Followup in 6 weeks after starting PAP therapy

## 2023-10-27 NOTE — Assessment & Plan Note (Addendum)
 Chronic problem with exacerbation.  Still wants to avoid any surgical intervention.  Discussed icing regimen of home exercises, discussed which activities to do in which ones to avoid.  Increase activity slowly.  Discussed icing regimen.  Follow-up with me again in 12 weeks patient will monitor blood sugar closer.  Patient has chronic kidney disease which does make it difficult for anything such as anti-inflammatories as well as hypertension.

## 2023-10-27 NOTE — Patient Instructions (Signed)
 Good to see you. Bilateral knee injections today.  See me again in 3 months.

## 2023-10-27 NOTE — Telephone Encounter (Signed)
 Per Dr Shlomo, please cancel the PAP orders that I placed.

## 2023-10-27 NOTE — Progress Notes (Signed)
 This encounter was created in error - please disregard.

## 2023-10-27 NOTE — Telephone Encounter (Signed)
-----   Message from Wilbert Bihari sent at 10/27/2023 10:55 AM EDT ----- Please disregard the sleep orders >>this was entered in error - can you please cancel the PAP orders that I placed

## 2023-10-27 NOTE — Addendum Note (Signed)
 Addended by: SHLOMO CORNING R on: 10/27/2023 10:55 AM   Modules accepted: Orders, Level of Service

## 2023-10-30 DIAGNOSIS — H401131 Primary open-angle glaucoma, bilateral, mild stage: Secondary | ICD-10-CM | POA: Diagnosis not present

## 2023-10-30 DIAGNOSIS — E1122 Type 2 diabetes mellitus with diabetic chronic kidney disease: Secondary | ICD-10-CM | POA: Diagnosis not present

## 2023-10-30 DIAGNOSIS — N183 Chronic kidney disease, stage 3 unspecified: Secondary | ICD-10-CM | POA: Diagnosis not present

## 2023-10-30 DIAGNOSIS — E890 Postprocedural hypothyroidism: Secondary | ICD-10-CM | POA: Diagnosis not present

## 2023-10-31 ENCOUNTER — Ambulatory Visit: Admitting: Cardiology

## 2023-11-03 NOTE — Progress Notes (Unsigned)
 Patient ID: Jade Sullivan                 DOB: [birth year]                      MRN: [id number]      HPI: Jade Sullivan is a 78 y.o. female referred by Dr. Wendel  to HTN clinic. PMH is significant for T2DM, hypertension, HLD, elevated Lpa, CKD stage 3. Aortic atherosclerosis  Renal ultra sound showed moderate blockage on one of the kidney arteries. At lst visit her BP monitor was validated and all meds were verified. Patient is very adherent to her meds and she follows low salt diet. Patient is on clonidine  patch and oral clonidine  0.2 mg three times daily. Given too high clonidine  dose and anticholinergic burden at last visit reduce clonidine  dose to 0.1 mg three times daily from 0.2 mg three times daily. Patient presented today for follow up. Brought in home BP log in 2 weeks for clonidine  dose reduction her BP started going in 140-150/89-95 range so she increased the dose back to 0.2 mg three times daily. From last 2 weeks her BP ~ 115-120/70-80 range   The patient presented for follow-up of hypertension. She reports checking her blood pressure twice daily, with average readings around 115/75 mmHg and a heart rate of 78 bpm. She tolerates her current medications well without any side effects. The patient brought all her medications and her blood pressure monitor for validation; her home readings are within 10 points of the clinic monitor. She reports feeling well and denies symptoms such as swelling, dizziness, headache, shortness of breath, or palpitations. She confirms regular medication adherence and notes that she avoids eating out. She plans to restart her exercise routine with a friend starting next Monday   Current HTN meds: clonidine  0.3 mg patch every 7 days, clonidine  0.1 mg 3 times daily, irbesartan  300 mg daily, minoxidil  5 mg three times daily , nebivolol  10 mg at bedtime , and spironolactone  100 mg daily.   Previously tried: amlodipine - gingival hyperplasia and  hydrochlorothiazide- gout  BP goal: <130/80 Last lab: Cr Cl - 27 mL/min  Home monitor validated on 10/02/2023 reads within 10 points compared to office monitor  Family History:  Relation Problem Comments  Mother (Deceased) Colon polyps   Diabetes DM2  Hypertension   Pneumonia died from this condition    Father (Deceased) Colon polyps   Heart disease died from this condition    Sister (Deceased) Colon polyps   Kidney disease HBP and DM2; relative died from this condition    Brother (Deceased) Colon polyps   Heart disease MI - HBP and DM2    Brother (Deceased) Colon polyps     Social History:  Alcohol: none  Smoking: none   Diet: Patient follows a low-salt, home-cooked diet and avoids eating out.  Fluids: On restricted water intake due to CKD.  Exercise: Plans to begin a regular walking routine--30-minute walks every other day.     Wt Readings from Last 3 Encounters:  10/27/23 176 lb (79.8 kg)  09/02/23 180 lb (81.6 kg)  08/26/23 187 lb (84.8 kg)   BP Readings from Last 3 Encounters:  11/04/23 132/80  10/27/23 (!) 140/70  10/02/23 123/79   Pulse Readings from Last 3 Encounters:  10/27/23 76  10/02/23 75  09/02/23 74    Renal function: CrCl cannot be calculated (Patient's most recent lab result is older than the maximum  21 days allowed.).  Past Medical History:  Diagnosis Date   Allergy    seasonal allergies   Arthritis    bilateral knees   Cataract    not a surgical candidate at this time (05/29/2020)   Chronic kidney disease    COVID-19 04/2019   Diabetes mellitus without complication (HCC)    on meds   Diverticulosis of colon (without mention of hemorrhage)    Dysmetabolic syndrome X    Glaucoma    on meds   Gout    H/O hyperthyroidism    By patient h/o hyperthyroidism with protosis, s/p subtotal thyroidectomy. On no replacement therapy. Last thyroid  functions 2013 in [place]   Plan Will need thyroid  functions Feb '15 with recommendations to  follow.     Heart murmur    hx of   Hyperlipidemia    on meds   Hypertension    on multiple meds   Hypothyroidism    on meds   Kidney lesion    On supplemental oxygen  by nasal cannula    at bedtime   Pancreatic cyst    Tubular adenoma of colon     Current Outpatient Medications on File Prior to Visit  Medication Sig Dispense Refill   ALPRAZolam  (XANAX ) 0.25 MG tablet Take 1 tablet (0.25 mg total) by mouth 2 (two) times daily as needed for anxiety (prior to dentist). 20 tablet 0   aspirin EC 81 MG tablet Take 81 mg by mouth every evening. Swallow whole.     aspirin-acetaminophen -caffeine (EXCEDRIN MIGRAINE) 250-250-65 MG tablet Take 1-2 tablets by mouth every 6 (six) hours as needed for headache (sinus headaches/pain).     atorvastatin  (LIPITOR) 40 MG tablet Take 1 tablet (40 mg total) by mouth daily. 90 tablet 3   Cholecalciferol (VITAMIN D3 MAXIMUM STRENGTH) 125 MCG (5000 UT) capsule Take 5,000 Units by mouth daily.     cloNIDine  (CATAPRES  - DOSED IN MG/24 HR) 0.3 mg/24hr patch Place 0.3 mg onto the skin every Wednesday.     fish oil-omega-3 fatty acids 1000 MG capsule Take 1 g by mouth daily.     gabapentin  (NEURONTIN ) 100 MG capsule TAKE 2 CAPSULES(200 MG) BY MOUTH AT BEDTIME 180 capsule 0   irbesartan  (AVAPRO ) 300 MG tablet TAKE 1 TABLET(300 MG) BY MOUTH IN THE MORNING 90 tablet 0   latanoprost  (XALATAN ) 0.005 % ophthalmic solution Place 1 drop into both eyes at bedtime.      levothyroxine  (SYNTHROID ) 75 MCG tablet Take 75-112.5 mcg by mouth See admin instructions. Take 112.5 mg on Mondays and Wednesdays, Take 75 mcg daily on all other days     LORazepam  (ATIVAN ) 1 MG tablet Take tablet by mouth 1 hour prior to exam 1 tablet 0   metFORMIN  (GLUCOPHAGE ) 500 MG tablet Take 500 mg by mouth daily.     minoxidil  (LONITEN ) 2.5 MG tablet Take 5 mg by mouth 3 (three) times daily.     Multiple Vitamin (MULTIVITAMIN WITH MINERALS) TABS tablet Take 2 tablets by mouth every evening.      nebivolol  (BYSTOLIC ) 10 MG tablet Take 1 tablet (10 mg total) by mouth at bedtime. 90 tablet 3   REPATHA  SURECLICK 140 MG/ML SOAJ ADMINISTER 1 ML UNDER THE SKIN EVERY 14 DAYS 2 mL 11   Semaglutide , 2 MG/DOSE, (OZEMPIC , 2 MG/DOSE,) 8 MG/3ML SOPN Inject 2 mg into the skin once a week. 2 mL 6   spironolactone  (ALDACTONE ) 100 MG tablet Take 100 mg by mouth every evening.  timolol  (TIMOPTIC ) 0.5 % ophthalmic solution Place 1 drop into both eyes 2 (two) times daily.     vitamin B-12 (CYANOCOBALAMIN) 500 MCG tablet Take 500 mcg by mouth 2 (two) times daily.     No current facility-administered medications on file prior to visit.    Allergies  Allergen Reactions   Amlodipine     Gingival hyperplasia   Hctz [Hydrochlorothiazide] Other (See Comments)    gout   Jardiance  [Empagliflozin ] Other (See Comments)    Yeast Infections    Blood pressure 132/80.   Assessment/Plan:  1. Hypertension -  Hypertension Assessment: In office BP 132/80 mmHg heart rate 75 (goal <130/80) Takes and tolerates BP meds well without any side effects  Home BP ~ 115-120/70-80 on validated BP monitor  At last visit given too high dose on clonidine  and anticholinergic burden we try to lower oral clonidine  dose from 0.2 mg three times daily to 0.1 mg three times daily, BP started going up in 2 weeks of dose adjustment  Plan:  Patient is back on clonidine  0.2 mg three times daily dose  Continue taking clonidine  0.3 mg patch every 7 days, irbesartan  300 mg daily, minoxidil  5 mg three times daily , nebivolol  10 mg at bedtime , and spironolactone  100 mg daily Patient to keep record of BP readings with heart rate and report to us  at the next visit Pt has apt with Dr.Hilty for lipid management in 1 month  Follow up lab(s): none     Thank you  [name] Blanch, Pharm.D [facility] BIR[facility]. [facility] [address], [place], [place] [address] Phone: [phone]; Fax: [phone]

## 2023-11-04 ENCOUNTER — Ambulatory Visit: Admitting: Pharmacist

## 2023-11-04 ENCOUNTER — Encounter: Payer: Self-pay | Admitting: Pharmacist

## 2023-11-04 VITALS — BP 132/80

## 2023-11-04 DIAGNOSIS — I1A0 Resistant hypertension: Secondary | ICD-10-CM

## 2023-11-04 MED ORDER — CLONIDINE HCL 0.2 MG PO TABS: 0.2000 mg | ORAL_TABLET | Freq: Two times a day (BID) | ORAL | 3 refills | Status: AC

## 2023-11-04 NOTE — Assessment & Plan Note (Signed)
 Assessment: In office BP 132/80 mmHg heart rate 75 (goal <130/80) Takes and tolerates BP meds well without any side effects  Home BP ~ 115-120/70-80 on validated BP monitor  At last visit given too high dose on clonidine  and anticholinergic burden we try to lower oral clonidine  dose from 0.2 mg three times daily to 0.1 mg three times daily, BP started going up in 2 weeks of dose adjustment  Plan:  Patient is back on clonidine  0.2 mg three times daily dose  Continue taking clonidine  0.3 mg patch every 7 days, irbesartan  300 mg daily, minoxidil  5 mg three times daily , nebivolol  10 mg at bedtime , and spironolactone  100 mg daily Patient to keep record of BP readings with heart rate and report to us  at the next visit Pt has apt with Dr.Hilty for lipid management in 1 month  Follow up lab(s): none

## 2023-11-05 ENCOUNTER — Ambulatory Visit (HOSPITAL_COMMUNITY): Admission: RE | Admit: 2023-11-05 | Source: Ambulatory Visit

## 2023-11-06 NOTE — Addendum Note (Signed)
 Addended by: Ilsa Bonello C on: 11/06/2023 02:25 PM   Modules accepted: Orders

## 2023-11-11 ENCOUNTER — Ambulatory Visit: Admitting: Podiatry

## 2023-11-11 ENCOUNTER — Encounter: Payer: Self-pay | Admitting: Podiatry

## 2023-11-11 DIAGNOSIS — B351 Tinea unguium: Secondary | ICD-10-CM

## 2023-11-11 DIAGNOSIS — E1122 Type 2 diabetes mellitus with diabetic chronic kidney disease: Secondary | ICD-10-CM

## 2023-11-11 DIAGNOSIS — M79675 Pain in left toe(s): Secondary | ICD-10-CM

## 2023-11-11 DIAGNOSIS — M79674 Pain in right toe(s): Secondary | ICD-10-CM

## 2023-11-11 DIAGNOSIS — N1831 Chronic kidney disease, stage 3a: Secondary | ICD-10-CM

## 2023-11-11 NOTE — Progress Notes (Signed)
 This patient returns to my office for at risk foot care.  This patient requires this care by a professional since this patient will be at risk due to having diabetes and CKD.  This patient is unable to cut nails herself since the patient cannot reach hernails.These nails are painful walking and wearing shoes.  She also has a painful callus left forefoot.This patient presents for at risk foot care today.  General Appearance  Alert, conversant and in no acute stress.  Vascular  Dorsalis pedis and posterior tibial  pulses are palpable  bilaterally.  Capillary return is within normal limits  bilaterally. Temperature is within normal limits  bilaterally.  Neurologic  Senn-Weinstein monofilament wire test within normal limits  bilaterally. Muscle power within normal limits bilaterally.  Nails Thick disfigured discolored nails with subungual debris  from hallux to fifth toes bilaterally. No evidence of bacterial infection or drainage bilaterally.  Orthopedic  No limitations of motion  feet .  No crepitus or effusions noted.  No bony pathology or digital deformities noted.  Skin  normotropic skin noted bilaterally.  No signs of infections or ulcers noted.     Onychomycosis  Pain in right toes  Pain in left toes  Consent was obtained for treatment procedures.   Mechanical debridement of nails 1-5  bilaterally performed with a nail nipper.  Filed with dremel without incident.     Return office visit   3 months                   Told patient to return for periodic foot care and evaluation due to potential at risk complications.   [name] DPM  tomma

## 2023-11-18 ENCOUNTER — Ambulatory Visit: Attending: Internal Medicine

## 2023-11-18 DIAGNOSIS — E119 Type 2 diabetes mellitus without complications: Secondary | ICD-10-CM

## 2023-11-18 DIAGNOSIS — E1159 Type 2 diabetes mellitus with other circulatory complications: Secondary | ICD-10-CM

## 2023-11-18 DIAGNOSIS — Z6833 Body mass index (BMI) 33.0-33.9, adult: Secondary | ICD-10-CM

## 2023-11-18 NOTE — Progress Notes (Signed)
 This encounter was created in error - please disregard.

## 2023-11-18 NOTE — Procedures (Signed)
   SLEEP STUDY REPORT Patient Information Study Date: 09/27/2023 Patient Name: Jade Sullivan Patient ID: [id number] Birth Date: [birth year] Age: 78 Gender: Female BMI: 30.5 (W=183 lb, H=5' 5'') Stopbang: 3 Referring Physician: Lurena Red, MD  TEST DESCRIPTION: Home sleep apnea testing was completed using the WatchPat, a Type 1 device, utilizing  peripheral arterial tonometry (PAT), chest movement, actigraphy, pulse oximetry, pulse rate, body position and snore.  AHI was calculated with apnea and hypopnea using valid sleep time as the denominator. RDI includes apneas,  hypopneas, and RERAs. The data acquired and the scoring of sleep and all associated events were performed in  accordance with the recommended standards and specifications as outlined in the AASM Manual for the Scoring of  Sleep and Associated Events 2.2.0 (2015).   FINDINGS: 1. No evidence of Obstructive Sleep Apnea with AHI 4.9/hr.  2. No Central Sleep Apnea. 3. Oxygen  desaturations as low as 84%. 4. Moderate snoring was present. O2 sats were < 88% for 0.6 minutes. 5. Total sleep time was 5 hrs and 39 min. 6. 6.5% of total sleep time was spent in REM sleep.  7. Normal sleep onset latency at 16 min.  8. Prolonged REM sleep onset latency at 127 min.  9. Total awakenings were 1.   DIAGNOSIS:  Normal study with no significant sleep disordered breathing.  RECOMMENDATIONS: 1. Normal study with no significant sleep disordered breathing. 2. Healthy sleep recommendations include: adequate nightly sleep (normal 7-9 hrs/night), avoidance of caffeine after  noon and alcohol near bedtime, and maintaining a sleep environment that is cool, dark and quiet. 3. Weight loss for overweight patients is recommended.  4. Snoring recommendations include: weight loss where appropriate, side sleeping, and avoidance of alcohol before  bed. 5. Operation of motor vehicle or dangerous equipment must be avoided when feeling drowsy,  excessively sleepy, or  mentally fatigued.  6. An ENT consultation which may be useful for specific causes of and possible treatment of bothersome snoring .  7. Weight loss may be of benefit in reducing the severity of snoring.   Signature: Wilbert Bihari, MD; [facility]; Diplomat, American Board of Sleep  Medicine Electronically Signed: 11/18/2023 9:48:15 AM

## 2023-11-18 NOTE — Addendum Note (Signed)
 Addended by: SHLOMO WILBERT SAUNDERS on: 11/18/2023 09:54 AM   Modules accepted: Orders, Level of Service

## 2023-11-19 ENCOUNTER — Telehealth: Payer: Self-pay | Admitting: *Deleted

## 2023-11-19 NOTE — Telephone Encounter (Signed)
-----   Message from Wilbert Bihari sent at 11/18/2023  9:49 AM EDT ----- Please let patient know that sleep study showed no significant sleep apnea.

## 2023-11-19 NOTE — Telephone Encounter (Signed)
 The patient has been notified of the result and verbalized understanding.  All questions (if any) were answered. Joshua Dalton Seip, CMA 11/19/2023 3:02 PM     Pt is agreeable to normal results.

## 2023-11-20 ENCOUNTER — Ambulatory Visit: Payer: Self-pay | Admitting: Internal Medicine

## 2023-11-21 ENCOUNTER — Encounter (HOSPITAL_COMMUNITY): Payer: Self-pay

## 2023-11-21 ENCOUNTER — Other Ambulatory Visit

## 2023-11-21 ENCOUNTER — Ambulatory Visit (HOSPITAL_COMMUNITY): Admission: RE | Admit: 2023-11-21 | Source: Ambulatory Visit

## 2023-11-21 ENCOUNTER — Other Ambulatory Visit: Payer: Self-pay | Admitting: Internal Medicine

## 2023-11-21 DIAGNOSIS — K862 Cyst of pancreas: Secondary | ICD-10-CM

## 2023-11-30 DIAGNOSIS — E1122 Type 2 diabetes mellitus with diabetic chronic kidney disease: Secondary | ICD-10-CM | POA: Diagnosis not present

## 2023-11-30 DIAGNOSIS — H401131 Primary open-angle glaucoma, bilateral, mild stage: Secondary | ICD-10-CM | POA: Diagnosis not present

## 2023-11-30 DIAGNOSIS — E890 Postprocedural hypothyroidism: Secondary | ICD-10-CM | POA: Diagnosis not present

## 2023-11-30 DIAGNOSIS — N183 Chronic kidney disease, stage 3 unspecified: Secondary | ICD-10-CM | POA: Diagnosis not present

## 2023-12-02 DIAGNOSIS — E785 Hyperlipidemia, unspecified: Secondary | ICD-10-CM | POA: Diagnosis not present

## 2023-12-03 ENCOUNTER — Ambulatory Visit: Payer: Self-pay | Admitting: Internal Medicine

## 2023-12-03 LAB — NMR, LIPOPROFILE
Cholesterol, Total: 87 mg/dL — ABNORMAL LOW (ref 100–199)
HDL Particle Number: 28.6 umol/L — ABNORMAL LOW (ref >=30.5)
HDL-C: 46 mg/dL (ref >39)
LDL Particle Number: 332 nmol/L (ref <1000)
LDL-C (NIH Calc): 27 mg/dL (ref 0–99)
LP-IR Score: 26 (ref <=45)
Small LDL Particle Number: 124 nmol/L (ref <=527)
Triglycerides: 59 mg/dL (ref 0–149)

## 2023-12-08 ENCOUNTER — Encounter: Payer: Self-pay | Admitting: Internal Medicine

## 2023-12-08 ENCOUNTER — Other Ambulatory Visit: Payer: Self-pay | Admitting: Family Medicine

## 2023-12-08 ENCOUNTER — Ambulatory Visit: Attending: Internal Medicine | Admitting: Internal Medicine

## 2023-12-08 VITALS — BP 128/80 | HR 75 | Ht 64.0 in | Wt 176.6 lb

## 2023-12-08 DIAGNOSIS — E785 Hyperlipidemia, unspecified: Secondary | ICD-10-CM

## 2023-12-08 DIAGNOSIS — I700 Atherosclerosis of aorta: Secondary | ICD-10-CM

## 2023-12-08 DIAGNOSIS — E7841 Elevated Lipoprotein(a): Secondary | ICD-10-CM

## 2023-12-08 DIAGNOSIS — E119 Type 2 diabetes mellitus without complications: Secondary | ICD-10-CM

## 2023-12-08 NOTE — Progress Notes (Signed)
 LIPID CLINIC CONSULT NOTE  Chief Complaint:  Manage dyslipidemia  Primary Care Physician: Katina Pfeiffer, PA-C  Primary Cardiologist:  Arun K Thukkani, MD  HPI:  Jade Sullivan is a 78 y.o. female who is being seen today for the evaluation of dyslipidemia at the request of Katina Pfeiffer, [place]. Jade Sullivan is seen today for evaluation management of dyslipidemia.  She has a history of high cholesterol, type 2 diabetes, chronic kidney disease (stage IIIa) and hypertension but no known coronary disease or prior history of heart attack or stroke.  Lab work in August which showed total cholesterol 121, triglycerides 91, HDL 34 and LDL 69 on the background of atorvastatin  80 mg daily therapy.  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  She was referred specifically for evaluation and management of this.  She has been noted to have some aortic atherosclerosis.  10/15/2022  Jade Sullivan is seen today in follow-up.  She has gotten the hang of her PCSK9 inhibitor.  That being said her cholesterol numbers are now much lower.  Her lipid particle numbers went from 1016 down to 458, LDL-C now 26 (down from 77) and more importantly her LP(a) is come down from 123 down to 55.6 nmol/L.  She seems to be tolerating the medicine well without any significant side effects.  12/08/2023  Jade Sullivan is seen today in follow-up.  Overall she is doing well.  She has had an outstanding response to Repatha .  Her recent LDL particle number was 332 with an LDL 27, HDL 46 and triglycerides 59.  Previously she had a substantial improvement in her LP(a) to normal range.  PMHx:  Past Medical History:  Diagnosis Date   Allergy    seasonal allergies   Arthritis    bilateral knees   Cataract    not a surgical candidate at this time (05/29/2020)   Chronic kidney disease    COVID-19 04/2019   Diabetes mellitus without complication (HCC)    on meds   Diverticulosis of colon (without mention of  hemorrhage)    Dysmetabolic syndrome X    Glaucoma    on meds   Gout    H/O hyperthyroidism    By patient h/o hyperthyroidism with protosis, s/p subtotal thyroidectomy. On no replacement therapy. Last thyroid  functions 2013 in [place]   Plan Will need thyroid  functions Feb '15 with recommendations to follow.     Heart murmur    hx of   Hyperlipidemia    on meds   Hypertension    on multiple meds   Hypothyroidism    on meds   Kidney lesion    On supplemental oxygen  by nasal cannula    at bedtime   Pancreatic cyst    Tubular adenoma of colon     Past Surgical History:  Procedure Laterality Date   BIOPSY  06/27/2020   Procedure: BIOPSY;  Surgeon: Albertus Gordy CHRISTELLA, MD;  Location: [facility];  Service: Gastroenterology;;   CESAREAN SECTION  1969   COLONOSCOPY  2016   JMP-MAC-2 day suprep (good)-TICS/TA   COLONOSCOPY WITH PROPOFOL  N/A 06/27/2020   Procedure: COLONOSCOPY WITH PROPOFOL ;  Surgeon: Albertus Gordy CHRISTELLA, MD;  Location: [facility];  Service: Gastroenterology;  Laterality: N/A;   DILATATION & CURETTAGE/HYSTEROSCOPY WITH MYOSURE N/A 04/15/2023   Procedure: DILATATION & CURETTAGE/HYSTEROSCOPY WITH MYOSURE Myomectomy and endometrial biopsy;  Surgeon: Dallie Vera GAILS, MD;  Location: [facility];  Service: Gynecology;  Laterality: N/A;   EYE SURGERY  LIPOMA EXCISION Right 11/24/2014   Procedure: EXCISION RIGHT SHOULDER LIPOMA;  Surgeon: Debby Shipper, MD;  Location: [facility];  Service: General;  Laterality: Right;   POLYPECTOMY  2016   TA   POLYPECTOMY  06/27/2020   Procedure: POLYPECTOMY;  Surgeon: Albertus Gordy HERO, MD;  Location: [facility];  Service: Gastroenterology;;   RIGHT HEART CATH N/A 06/03/2023   Procedure: RIGHT HEART CATH;  Surgeon: Wendel Lurena POUR, MD;  Location: [facility];  Service: Cardiovascular;  Laterality: N/A;   THYROIDECTOMY, PARTIAL  1981   TONSILLECTOMY     UMBILICAL HERNIA REPAIR  2000   WISDOM TOOTH EXTRACTION       FAMHx:  Family History  Problem Relation Age of Onset   Diabetes Mother        DM2   Hypertension Mother    Pneumonia Mother        died from this condition   Colon polyps Mother    Heart disease Father        died from this condition   Colon polyps Father    Kidney disease Sister        HBP and DM2; relative died from this condition   Colon polyps Sister    Heart disease Brother        MI - HBP and DM2   Colon polyps Brother    Colon polyps Brother     SOCHx:   reports that she has quit smoking. She has never used smokeless tobacco. She reports that she does not drink alcohol and does not use drugs.  ALLERGIES:  Allergies  Allergen Reactions   Amlodipine     Gingival hyperplasia   Hctz [Hydrochlorothiazide] Other (See Comments)    gout   Jardiance  [Empagliflozin ] Other (See Comments)    Yeast Infections    ROS: Pertinent items noted in HPI and remainder of comprehensive ROS otherwise negative.  HOME MEDS: Current Outpatient Medications on File Prior to Visit  Medication Sig Dispense Refill   ALPRAZolam  (XANAX ) 0.25 MG tablet Take 1 tablet (0.25 mg total) by mouth 2 (two) times daily as needed for anxiety (prior to dentist). 20 tablet 0   aspirin EC 81 MG tablet Take 81 mg by mouth every evening. Swallow whole.     aspirin-acetaminophen -caffeine (EXCEDRIN MIGRAINE) 250-250-65 MG tablet Take 1-2 tablets by mouth every 6 (six) hours as needed for headache (sinus headaches/pain).     atorvastatin  (LIPITOR) 40 MG tablet Take 1 tablet (40 mg total) by mouth daily. 90 tablet 3   Cholecalciferol (VITAMIN D3 MAXIMUM STRENGTH) 125 MCG (5000 UT) capsule Take 5,000 Units by mouth daily.     cloNIDine  (CATAPRES  - DOSED IN MG/24 HR) 0.3 mg/24hr patch Place 0.3 mg onto the skin every Wednesday.     cloNIDine  (CATAPRES ) 0.2 MG tablet Take 1 tablet (0.2 mg total) by mouth 2 (two) times daily. 180 tablet 3   fish oil-omega-3 fatty acids 1000 MG capsule Take 1 g by mouth daily.      irbesartan  (AVAPRO ) 300 MG tablet TAKE 1 TABLET(300 MG) BY MOUTH IN THE MORNING 90 tablet 0   latanoprost  (XALATAN ) 0.005 % ophthalmic solution Place 1 drop into both eyes at bedtime.      levothyroxine  (SYNTHROID ) 75 MCG tablet Take 75-112.5 mcg by mouth See admin instructions. Take 112.5 mg on Mondays and Wednesdays, Take 75 mcg daily on all other days     LORazepam  (ATIVAN ) 1 MG tablet Take tablet by mouth 1  hour prior to exam 1 tablet 0   metFORMIN  (GLUCOPHAGE ) 500 MG tablet Take 500 mg by mouth daily.     minoxidil  (LONITEN ) 2.5 MG tablet Take 5 mg by mouth 3 (three) times daily.     Multiple Vitamin (MULTIVITAMIN WITH MINERALS) TABS tablet Take 2 tablets by mouth every evening.     nebivolol  (BYSTOLIC ) 10 MG tablet Take 1 tablet (10 mg total) by mouth at bedtime. 90 tablet 3   REPATHA  SURECLICK 140 MG/ML SOAJ ADMINISTER 1 ML UNDER THE SKIN EVERY 14 DAYS 2 mL 11   Semaglutide , 2 MG/DOSE, (OZEMPIC , 2 MG/DOSE,) 8 MG/3ML SOPN Inject 2 mg into the skin once a week. 2 mL 6   spironolactone  (ALDACTONE ) 100 MG tablet Take 100 mg by mouth every evening.     timolol  (TIMOPTIC ) 0.5 % ophthalmic solution Place 1 drop into both eyes 2 (two) times daily.     vitamin B-12 (CYANOCOBALAMIN) 500 MCG tablet Take 500 mcg by mouth 2 (two) times daily.     No current facility-administered medications on file prior to visit.    LABS/IMAGING: No results found for this or any previous visit (from the past 48 hours). No results found.  LIPID PANEL:    Component Value Date/Time   CHOL 65 (L) 03/07/2023 1119   TRIG 44 03/07/2023 1119   HDL 44 03/07/2023 1119   CHOLHDL 1.5 03/07/2023 1119   CHOLHDL 4 05/17/2020 1050   VLDL 21.0 05/17/2020 1050   LDLCALC 8 03/07/2023 1119   Lipoprotein (a)  Date/Time Value Ref Range Status  10/08/2022 10:56 AM 55.6 <75.0 nmol/L Final    Comment:    Note:  Values greater than or equal to 75.0 nmol/L may        indicate an independent risk factor for CHD,        but  must be evaluated with caution when applied        to non-Caucasian populations due to the        influence of genetic factors on Lp(a) across        ethnicities.       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   Ms. Leventhal has had a substantial improvement in her lipids with an LDL now at 27 and LP(a) in the normal range of 55.6 nmol/L on Repatha .  She seems to be tolerating this well.  Her lipids have been stable over the past year.  I think she can continue follow-up with her general cardiologist who can reauthorize her Repatha  when it is due on an annual basis.  She should continue on atorvastatin  and fish oil as well with that.  Follow-up with me as needed.  Vinie KYM Maxcy, MD, [facility], FNLA, FACP  [place]  [facility]  Medical Director of the [facility]  Cardiovascular Risk Reduction Clinic Diplomate of the American Board of Clinical Lipidology Attending Cardiologist  Direct Dial: [phone]  Fax:  [phone]  Website:  [email or web address]   Vinie BROCKS Retaj Hilbun 12/08/2023, 10:13 AM

## 2023-12-22 ENCOUNTER — Other Ambulatory Visit: Payer: Self-pay | Admitting: Internal Medicine

## 2023-12-22 ENCOUNTER — Encounter: Payer: Self-pay | Admitting: Pharmacist

## 2023-12-22 NOTE — Telephone Encounter (Signed)
 Error

## 2023-12-26 ENCOUNTER — Encounter: Payer: Self-pay | Admitting: *Deleted

## 2023-12-27 NOTE — Progress Notes (Unsigned)
 OFFICE NOTE:    Date:  12/30/2023  ID:  WILBA MUTZ, DOB 04-Aug-1945, MRN [id number] PCP: Katina Pfeiffer, PA-C  [facility] Providers Cardiologist:  Lurena MARLA Red, MD        LVH  Dilated pulmonary artery  Pericardial effusion CMR 05/01/23: mod LVH, not dx for HCM, findings unlikely to rep amyloid, mod RVE, mild AI, Asc aorta 40 mm, severe main PA dilation, mod PI, Lg pericardial effusion RHC 06/03/23: mean PA 24, mean PCWP 6, CO 6.9, PVR 2.59 WU; no PAH or tamponade  TTE 06/06/23: EF 60-65, no RWMA, severe LVH, NL RVSF, mild MR, mild to mod AI, AV sclerosis, Mod PI, Ao root 41 mm, Asc Aorta 41 mm, mod effusion Hypertension  Renal artery US  07/17/23: L RA 1-59, no R RA stenosis  Hyperlipidemia Chronic kidney disease  Diabetes mellitus  Hyperthyroidism s/p subtotal thyroidectomy  Aortic atherosclerosis        Discussed the use of AI scribe software for clinical note transcription with the patient, who gave verbal consent to proceed. History of Present Illness Jade Sullivan is a 78 y.o. 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 She had RHC in 05/2023 that showed no pulmonary hypertension or signs of tamponade. She was last seen by Dr. Red in 07/2023. CRP was <1. She was therefore not placed on med Rx for pericarditis. BP remained uncontrolled and she was set up for sleep study and RA US . She did not have significant RAS on renal artery US . Sleep study was negative for OSA. She is on multiple medications for BP and may ultimately be a good candidate for renal artery denervation.   She is doing well without symptoms such as chest pain, shortness of breath, or syncope. Her blood pressure has improved with current medications, and she monitors it at home. She is doing well with her current medications and has not experienced significant side effects.    ROS-See HPI    Studies Reviewed:       Risk  Assessment/Calculations:       STOP-Bang Score:  3       Physical Exam:  VS:  BP 128/70   Pulse 74   Ht 5' 4 (1.626 m)   Wt 176 lb 4.8 oz (80 kg)   SpO2 93%   BMI 30.26 kg/m        Wt Readings from Last 3 Encounters:  12/30/23 176 lb 4.8 oz (80 kg)  12/08/23 176 lb 9.6 oz (80.1 kg)  10/27/23 176 lb (79.8 kg)    Constitutional:      Appearance: Healthy appearance. Not in distress.  Neck:     Vascular: No JVR. JVD normal.  Pulmonary:     Breath sounds: Normal breath sounds. No wheezing. No rales.  Cardiovascular:     Normal rate. Regular rhythm.     Murmurs: There is a grade 2/6 systolic murmur at the ULSB.  Edema:    Peripheral edema absent.  Abdominal:     Palpations: Abdomen is soft.       Assessment and Plan:    Assessment & Plan Pericardial effusion Idiopathic pericardial effusion.  After last visit, CRP was negative.  Right heart cath in March 2025 demonstrated no evidence of pulmonary artery hypertension or cardiac tamponade.  She will be managed with periodic echocardiograms.  I reviewed her chart.  She has had documentation of pericardial effusion for several years now.  It  was larger on recent MRI. -Arrange follow-up limited echo to reassess pericardial effusion -If effusion stable, consider repeating echocardiogram yearly -Follow-up 6 months Resistant hypertension Blood pressure is much better controlled.  She is on multiple antihypertensive medications.  Renal artery ultrasound was negative for significant renal artery stenosis.  Sleep study was negative for obstructive sleep apnea. -Continue clonidine  patch 0.3 mg weekly, clonidine  0.2 mg twice daily, irbesartan  300 mg daily, minoxidil  5 mg 3 times daily, Nebivolol  10 mg daily, spironolactone  100 mg daily -Follow-up 6 months Stage 3a chronic kidney disease (HCC) Most recent creatinine stable. LVH (left ventricular hypertrophy) Moderate LVH noted on cardiac MRI January 2025.  She did not meet diagnostic  criteria for hypertrophic cardiomyopathy.  Findings were unlikely to represent cardiac amyloid. Suspect LVH related to HTN.          Dispo:  Return in about 6 months (around 06/28/2024) for w/ Dr. Wendel, or Glendia Ferrier, PA-C.  Signed, Glendia Ferrier, PA-C

## 2023-12-29 ENCOUNTER — Encounter: Payer: Self-pay | Admitting: *Deleted

## 2023-12-30 ENCOUNTER — Other Ambulatory Visit: Payer: Self-pay | Admitting: Family Medicine

## 2023-12-30 ENCOUNTER — Ambulatory Visit: Attending: Physician Assistant | Admitting: Physician Assistant

## 2023-12-30 ENCOUNTER — Encounter: Payer: Self-pay | Admitting: Physician Assistant

## 2023-12-30 VITALS — BP 128/70 | HR 74 | Ht 64.0 in | Wt 176.3 lb

## 2023-12-30 DIAGNOSIS — E890 Postprocedural hypothyroidism: Secondary | ICD-10-CM | POA: Diagnosis not present

## 2023-12-30 DIAGNOSIS — N1831 Chronic kidney disease, stage 3a: Secondary | ICD-10-CM | POA: Diagnosis not present

## 2023-12-30 DIAGNOSIS — I1A0 Resistant hypertension: Secondary | ICD-10-CM | POA: Diagnosis not present

## 2023-12-30 DIAGNOSIS — N183 Chronic kidney disease, stage 3 unspecified: Secondary | ICD-10-CM | POA: Diagnosis not present

## 2023-12-30 DIAGNOSIS — I3139 Other pericardial effusion (noninflammatory): Secondary | ICD-10-CM

## 2023-12-30 DIAGNOSIS — I517 Cardiomegaly: Secondary | ICD-10-CM

## 2023-12-30 DIAGNOSIS — Z1231 Encounter for screening mammogram for malignant neoplasm of breast: Secondary | ICD-10-CM

## 2023-12-30 DIAGNOSIS — H401131 Primary open-angle glaucoma, bilateral, mild stage: Secondary | ICD-10-CM | POA: Diagnosis not present

## 2023-12-30 DIAGNOSIS — E1122 Type 2 diabetes mellitus with diabetic chronic kidney disease: Secondary | ICD-10-CM | POA: Diagnosis not present

## 2023-12-30 NOTE — Patient Instructions (Signed)
 Medication Instructions:  Your physician recommends that you continue on your current medications as directed. Please refer to the Current Medication list given to you today.  *If you need a refill on your cardiac medications before your next appointment, please call your pharmacy*  Lab Work: None ordered  If you have labs (blood work) drawn today and your tests are completely normal, you will receive your results only by: MyChart Message (if you have MyChart) OR A paper copy in the mail If you have any lab test that is abnormal or we need to change your treatment, we will call you to review the results.  Testing/Procedures: Your physician has requested that you have an limited echocardiogram. Echocardiography is a painless test that uses sound waves to create images of your heart. It provides your doctor with information about the size and shape of your heart and how well your heart's chambers and valves are working. This procedure takes approximately one hour. There are no restrictions for this procedure. Please do NOT wear cologne, perfume, aftershave, or lotions (deodorant is allowed). Please arrive 15 minutes prior to your appointment time.  Please note: We ask at that you not bring children with you during ultrasound (echo/ vascular) testing. Due to room size and safety concerns, children are not allowed in the ultrasound rooms during exams. Our front office staff cannot provide observation of children in our lobby area while testing is being conducted. An adult accompanying a patient to their appointment will only be allowed in the ultrasound room at the discretion of the ultrasound technician under special circumstances. We apologize for any inconvenience.   Follow-Up: At [facility], you and your health needs are our priority.  As part of our continuing mission to provide you with exceptional heart care, our providers are all part of one team.  This team includes your primary  Cardiologist (physician) and Advanced Practice Providers or APPs (Physician Assistants and Nurse Practitioners) who all work together to provide you with the care you need, when you need it.  Your next appointment:   6 month(s)  Provider:   Arun K Thukkani, MD or Glendia Ferrier, PA-C          We recommend signing up for the patient portal called MyChart.  Sign up information is provided on this After Visit Summary.  MyChart is used to connect with patients for Virtual Visits (Telemedicine).  Patients are able to view lab/test results, encounter notes, upcoming appointments, etc.  Non-urgent messages can be sent to your provider as well.   To learn more about what you can do with MyChart, go to [email or web address].   Other Instructions

## 2023-12-30 NOTE — Assessment & Plan Note (Signed)
 Blood pressure is much better controlled.  She is on multiple antihypertensive medications.  Renal artery ultrasound was negative for significant renal artery stenosis.  Sleep study was negative for obstructive sleep apnea. -Continue clonidine  patch 0.3 mg weekly, clonidine  0.2 mg twice daily, irbesartan  300 mg daily, minoxidil  5 mg 3 times daily, Nebivolol  10 mg daily, spironolactone  100 mg daily -Follow-up 6 months

## 2023-12-30 NOTE — Assessment & Plan Note (Signed)
 Most recent creatinine stable.

## 2023-12-30 NOTE — Assessment & Plan Note (Signed)
 Idiopathic pericardial effusion.  After last visit, CRP was negative.  Right heart cath in March 2025 demonstrated no evidence of pulmonary artery hypertension or cardiac tamponade.  She will be managed with periodic echocardiograms.  I reviewed her chart.  She has had documentation of pericardial effusion for several years now.  It was larger on recent MRI. -Arrange follow-up limited echo to reassess pericardial effusion -If effusion stable, consider repeating echocardiogram yearly -Follow-up 6 months

## 2024-01-05 DIAGNOSIS — H401131 Primary open-angle glaucoma, bilateral, mild stage: Secondary | ICD-10-CM | POA: Diagnosis not present

## 2024-01-21 ENCOUNTER — Ambulatory Visit

## 2024-01-22 ENCOUNTER — Telehealth: Admitting: Cardiology

## 2024-01-22 NOTE — Progress Notes (Signed)
 Jade Sullivan [address] [place] [address] Phone: [phone] Subjective:   ISusannah Sullivan, am serving as a scribe for Dr. Arthea Claudene.  I'm seeing this patient by the request  of:  Katina Pfeiffer, PA-C  CC: Bilateral knee pain  YEP:Dlagzrupcz  10/27/2023 Chronic problem with exacerbation.  Still wants to avoid any surgical intervention.  Discussed icing regimen of home exercises, discussed which activities to do in which ones to avoid.  Increase activity slowly.  Discussed icing regimen.  Follow-up with me again in 12 weeks patient will monitor blood sugar closer.  Patient has chronic kidney disease which does make it difficult for anything such as anti-inflammatories as well as hypertension.      Update 01/27/2024 Jade Sullivan is a 78 y.o. female coming in with complaint of B knee pain. Patient states rain doing the most for her joints. No other concerns.      Past Medical History:  Diagnosis Date   Allergy    seasonal allergies   Arthritis    bilateral knees   Cataract    not a surgical candidate at this time (05/29/2020)   Chronic kidney disease    COVID-19 04/2019   Diabetes mellitus without complication (HCC)    on meds   Diverticulosis of colon (without mention of hemorrhage)    Dysmetabolic syndrome X    Glaucoma    on meds   Gout    H/O hyperthyroidism    By patient h/o hyperthyroidism with protosis, s/p subtotal thyroidectomy. On no replacement therapy. Last thyroid  functions 2013 in [place]   Plan Will need thyroid  functions Feb '15 with recommendations to follow.     Heart murmur    hx of   Hyperlipidemia    on meds   Hypertension    on multiple meds   Hypothyroidism    on meds   Kidney lesion    On supplemental oxygen  by nasal cannula    at bedtime   Pancreatic cyst    Tubular adenoma of colon    Past Surgical History:  Procedure Laterality Date   BIOPSY  06/27/2020   Procedure: BIOPSY;  Surgeon:  Albertus Gordy CHRISTELLA, MD;  Location: [facility];  Service: Gastroenterology;;   CESAREAN SECTION  1969   COLONOSCOPY  2016   JMP-MAC-2 day suprep (good)-TICS/TA   COLONOSCOPY WITH PROPOFOL  N/A 06/27/2020   Procedure: COLONOSCOPY WITH PROPOFOL ;  Surgeon: Albertus Gordy CHRISTELLA, MD;  Location: [facility];  Service: Gastroenterology;  Laterality: N/A;   DILATATION & CURETTAGE/HYSTEROSCOPY WITH MYOSURE N/A 04/15/2023   Procedure: DILATATION & CURETTAGE/HYSTEROSCOPY WITH MYOSURE Myomectomy and endometrial biopsy;  Surgeon: Dallie Vera GAILS, MD;  Location: [facility];  Service: Gynecology;  Laterality: N/A;   EYE SURGERY     LIPOMA EXCISION Right 11/24/2014   Procedure: EXCISION RIGHT SHOULDER LIPOMA;  Surgeon: Debby Shipper, MD;  Location: [facility];  Service: General;  Laterality: Right;   POLYPECTOMY  2016   TA   POLYPECTOMY  06/27/2020   Procedure: POLYPECTOMY;  Surgeon: Albertus Gordy CHRISTELLA, MD;  Location: [facility];  Service: Gastroenterology;;   RIGHT HEART CATH N/A 06/03/2023   Procedure: RIGHT HEART CATH;  Surgeon: Wendel Lurena POUR, MD;  Location: [facility];  Service: Cardiovascular;  Laterality: N/A;   THYROIDECTOMY, PARTIAL  1981   TONSILLECTOMY     UMBILICAL HERNIA REPAIR  2000   WISDOM TOOTH EXTRACTION     Social History   Socioeconomic  History   Marital status: Divorced    Spouse name: Not on file   Number of children: 1   Years of education: Not on file   Highest education level: Not on file  Occupational History   Occupation: Retired  Tobacco Use   Smoking status: Former   Smokeless tobacco: Never  Advertising Account Planner   Vaping status: Never Used  Substance and Sexual Activity   Alcohol use: No    Alcohol/week: 0.0 standard drinks of alcohol   Drug use: No   Sexual activity: Not Currently    Partners: Male    Birth control/protection: Post-menopausal    Comment: 1st intercourse- 16, partners- 5, divorced  Other Topics Concern   Not on file  Social  History Narrative   Divorced   Retired - engineer, petroleum   Social Drivers of Corporate Investment Banker Strain: Low Risk  (10/10/2021)   Overall Financial Resource Strain (CARDIA)    Difficulty of Paying Living Expenses: Not hard at all  Food Insecurity: No Food Insecurity (12/20/2021)   Hunger Vital Sign    Worried About Running Out of Food in the Last Year: Never true    Ran Out of Food in the Last Year: Never true  Transportation Needs: No Transportation Needs (12/20/2021)   PRAPARE - Administrator, Civil Service (Medical): No    Lack of Transportation (Non-Medical): No  Physical Activity: Sufficiently Active (10/10/2021)   Exercise Vital Sign    Days of Exercise per Week: 5 days    Minutes of Exercise per Session: 30 min  Stress: No Stress Concern Present (10/10/2021)   [employer] of Occupational Health - Occupational Stress Questionnaire    Feeling of Stress : Not at all  Social Connections: Moderately Integrated (10/10/2021)   Social Connection and Isolation Panel    Frequency of Communication with Friends and Family: More than three times a week    Frequency of Social Gatherings with Friends and Family: Twice a week    Attends Religious Services: More than 4 times per year    Active Member of [employer] or Organizations: Yes    Attends Engineer, Structural: More than 4 times per year    Marital Status: Divorced   Allergies  Allergen Reactions   Amlodipine     Gingival hyperplasia   Hctz [Hydrochlorothiazide] Other (See Comments)    gout   Jardiance  [Empagliflozin ] Other (See Comments)    Yeast Infections   Family History  Problem Relation Age of Onset   Diabetes Mother        DM2   Hypertension Mother    Pneumonia Mother        died from this condition   Colon polyps Mother    Heart disease Father        died from this condition   Colon polyps Father    Kidney disease Sister        HBP and DM2; relative died from this condition   Colon  polyps Sister    Heart disease Brother        MI - HBP and DM2   Colon polyps Brother    Colon polyps Brother     Current Outpatient Medications (Endocrine & Metabolic):    levothyroxine  (SYNTHROID ) 75 MCG tablet, Take 75-112.5 mcg by mouth See admin instructions. Take 112.5 mg on Mondays and Wednesdays, Take 75 mcg daily on all other days   metFORMIN  (GLUCOPHAGE ) 500 MG tablet, Take 500 mg  by mouth daily.   Semaglutide , 2 MG/DOSE, (OZEMPIC , 2 MG/DOSE,) 8 MG/3ML SOPN, Inject 2 mg into the skin once a week.  Current Outpatient Medications (Cardiovascular):    atorvastatin  (LIPITOR) 40 MG tablet, Take 1 tablet (40 mg total) by mouth daily.   cloNIDine  (CATAPRES  - DOSED IN MG/24 HR) 0.3 mg/24hr patch, Place 0.3 mg onto the skin every Wednesday.   cloNIDine  (CATAPRES ) 0.2 MG tablet, Take 1 tablet (0.2 mg total) by mouth 2 (two) times daily.   irbesartan  (AVAPRO ) 300 MG tablet, TAKE 1 TABLET(300 MG) BY MOUTH IN THE MORNING   minoxidil  (LONITEN ) 2.5 MG tablet, Take 5 mg by mouth 3 (three) times daily.   nebivolol  (BYSTOLIC ) 10 MG tablet, Take 1 tablet (10 mg total) by mouth at bedtime.   REPATHA  SURECLICK 140 MG/ML SOAJ, ADMINISTER 1 ML UNDER THE SKIN EVERY 14 DAYS   spironolactone  (ALDACTONE ) 100 MG tablet, Take 100 mg by mouth every evening.   Current Outpatient Medications (Analgesics):    aspirin EC 81 MG tablet, Take 81 mg by mouth every evening. Swallow whole.   aspirin-acetaminophen -caffeine (EXCEDRIN MIGRAINE) 250-250-65 MG tablet, Take 1-2 tablets by mouth every 6 (six) hours as needed for headache (sinus headaches/pain).  Current Outpatient Medications (Hematological):    vitamin B-12 (CYANOCOBALAMIN) 500 MCG tablet, Take 500 mcg by mouth 2 (two) times daily.  Current Outpatient Medications (Other):    ALPRAZolam  (XANAX ) 0.25 MG tablet, Take 1 tablet (0.25 mg total) by mouth 2 (two) times daily as needed for anxiety (prior to dentist).   Cholecalciferol (VITAMIN D3 MAXIMUM  STRENGTH) 125 MCG (5000 UT) capsule, Take 5,000 Units by mouth daily.   fish oil-omega-3 fatty acids 1000 MG capsule, Take 1 g by mouth daily.   gabapentin  (NEURONTIN ) 100 MG capsule, TAKE 2 CAPSULES(200 MG) BY MOUTH AT BEDTIME   latanoprost  (XALATAN ) 0.005 % ophthalmic solution, Place 1 drop into both eyes at bedtime.    LORazepam  (ATIVAN ) 1 MG tablet, Take tablet by mouth 1 hour prior to exam   Multiple Vitamin (MULTIVITAMIN WITH MINERALS) TABS tablet, Take 2 tablets by mouth every evening.   timolol  (TIMOPTIC ) 0.5 % ophthalmic solution, Place 1 drop into both eyes 2 (two) times daily.   Reviewed prior external information including notes and imaging from  primary care provider As well as notes that were available from care everywhere and other healthcare systems.  Past medical history, social, surgical and family history all reviewed in electronic medical record.  No pertanent information unless stated regarding to the chief complaint.   Review of Systems:  No headache, visual changes, nausea, vomiting, diarrhea, constipation, dizziness, abdominal pain, skin rash, fevers, chills, night sweats, weight loss, swollen lymph nodes, body aches, joint swelling, chest pain, shortness of breath, mood changes. POSITIVE muscle aches  Objective  Blood pressure 128/84, pulse 84, height 5' 4 (1.626 m), weight 171 lb (77.6 kg), SpO2 96%.   General: No apparent distress alert and oriented x3 mood and affect normal, dressed appropriately.  HEENT: Pupils equal, extraocular movements intact  Respiratory: Patient's speak in full sentences and does not appear short of breath  Cardiovascular: No lower extremity edema, non tender, no erythema  Bilateral knee exam shows significant arthritic changes noted.  Patient does have tender to palpation over the medial joint line bilaterally.  Abnormality noted with instability of varus and valgus force.  After informed written and verbal consent, patient was seated  on exam table. Right knee was prepped with alcohol swab and utilizing anterolateral  approach, patient's right knee space was injected with 4:1  marcaine  0.5%: Depo-Medrol  40mg /dL. Patient tolerated the procedure well without immediate complications.  After informed written and verbal consent, patient was seated on exam table. Left knee was prepped with alcohol swab and utilizing anterolateral approach, patient's left knee space was injected with 4:1  marcaine  0.5%: Depo-Medrol  40mg /dL. Patient tolerated the procedure well without immediate complications.    Impression and Recommendations:    The above documentation has been reviewed and is accurate and complete Adelbert Gaspard M Dois Juarbe, DO

## 2024-01-27 ENCOUNTER — Ambulatory Visit: Admitting: Family Medicine

## 2024-01-27 ENCOUNTER — Encounter: Payer: Self-pay | Admitting: Family Medicine

## 2024-01-27 VITALS — BP 128/84 | HR 84 | Ht 64.0 in | Wt 171.0 lb

## 2024-01-27 DIAGNOSIS — M170 Bilateral primary osteoarthritis of knee: Secondary | ICD-10-CM | POA: Diagnosis not present

## 2024-01-27 NOTE — Assessment & Plan Note (Signed)
 Bilateral injections given today, tolerated the procedure well, discussed icing regimen and home exercise continue to monitor weight which patient has done well.  Patient still wants to avoid any surgical intervention at the moment.  Continue to keep diabetes under control.  Follow-up with me again in 3 months

## 2024-01-27 NOTE — Patient Instructions (Signed)
 Good to see you! Injections in knees today See you again in 3 months

## 2024-01-28 ENCOUNTER — Ambulatory Visit (HOSPITAL_COMMUNITY): Admission: RE | Admit: 2024-01-28 | Source: Ambulatory Visit

## 2024-01-29 ENCOUNTER — Other Ambulatory Visit: Payer: Self-pay | Admitting: Internal Medicine

## 2024-01-29 DIAGNOSIS — N182 Chronic kidney disease, stage 2 (mild): Secondary | ICD-10-CM | POA: Diagnosis not present

## 2024-01-29 DIAGNOSIS — I3139 Other pericardial effusion (noninflammatory): Secondary | ICD-10-CM | POA: Diagnosis not present

## 2024-01-29 DIAGNOSIS — E782 Mixed hyperlipidemia: Secondary | ICD-10-CM | POA: Diagnosis not present

## 2024-01-29 DIAGNOSIS — N281 Cyst of kidney, acquired: Secondary | ICD-10-CM | POA: Diagnosis not present

## 2024-01-29 DIAGNOSIS — I1A0 Resistant hypertension: Secondary | ICD-10-CM | POA: Diagnosis not present

## 2024-02-03 ENCOUNTER — Telehealth: Payer: Self-pay | Admitting: Pharmacist

## 2024-02-03 NOTE — Telephone Encounter (Signed)
-----   Message from Jade Sullivan sent at 01/29/2024  2:47 PM EDT ----- Regarding: FW: pericardial effusion [name],     Thanks for seeing her for hypertension management.  See below.  I did not realize minoxidil  was associated with pericardial effusion.  She has a pericardial effusion.  Do think we can stop this medication and substitute it with something else?  Thanks ----- Message ----- From: Norine Manuelita LABOR, MD Sent: 01/29/2024   2:23 PM EDT To: Arun K Thukkani, MD Subject: pericardial effusion                           Hi Dr Sullivan -  I just inherited this patient's nephrology care from her prior provider who retired.  In seeing her today noted she has a pericardial effusion.  I suspect this is due to minoxidil .  She has very resistant hypertension and so I didn't immediately discontinue the medication as her BP was controlled in the 130s at her visit today.   I saw in your last note you were considering renal denvervation for her and I'll defer to you on that referral or changing her minoxidil  to alternate agent if you think that's appropriate given the presence of an effusion.    Thanks,  Manuelita Norine [facility] Pager [phone]

## 2024-02-03 NOTE — Telephone Encounter (Addendum)
 Call patient ans advised to taper minoxidil  does from 5 mg three times daily to 5 mg twice daily for 3 days 2.5 mg twice daily for 3 days and 2.5 mg daily then stop. Patient verbalize understanding. Pt to report home BP readings in 1 week.

## 2024-02-10 NOTE — Telephone Encounter (Signed)
 Call to f/u on BP. Pt now at minoxidil  2.5 mg once a day dose her BP slightly going up. ~130-140/78-85 range. Advised to keep an eye on and we will f/u in 1 week.

## 2024-02-11 ENCOUNTER — Ambulatory Visit: Admitting: Podiatry

## 2024-02-11 ENCOUNTER — Encounter: Payer: Self-pay | Admitting: Podiatry

## 2024-02-11 DIAGNOSIS — B351 Tinea unguium: Secondary | ICD-10-CM

## 2024-02-11 DIAGNOSIS — M79675 Pain in left toe(s): Secondary | ICD-10-CM

## 2024-02-11 DIAGNOSIS — E1122 Type 2 diabetes mellitus with diabetic chronic kidney disease: Secondary | ICD-10-CM

## 2024-02-11 DIAGNOSIS — N1831 Chronic kidney disease, stage 3a: Secondary | ICD-10-CM

## 2024-02-11 DIAGNOSIS — M79674 Pain in right toe(s): Secondary | ICD-10-CM | POA: Diagnosis not present

## 2024-02-11 NOTE — Progress Notes (Signed)
 This patient returns to my office for at risk foot care.  This patient requires this care by a professional since this patient will be at risk due to having diabetes and CKD.  This patient is unable to cut nails herself since the patient cannot reach hernails.These nails are painful walking and wearing shoes.  She also has a painful callus left forefoot.This patient presents for at risk foot care today.  General Appearance  Alert, conversant and in no acute stress.  Vascular  Dorsalis pedis and posterior tibial  pulses are palpable  bilaterally.  Capillary return is within normal limits  bilaterally. Temperature is within normal limits  bilaterally.  Neurologic  Senn-Weinstein monofilament wire test within normal limits  bilaterally. Muscle power within normal limits bilaterally.  Nails Thick disfigured discolored nails with subungual debris  from hallux to fifth toes bilaterally. No evidence of bacterial infection or drainage bilaterally.  Orthopedic  No limitations of motion  feet .  No crepitus or effusions noted.  No bony pathology or digital deformities noted.  Skin  normotropic skin noted bilaterally.  No signs of infections or ulcers noted.     Onychomycosis  Pain in right toes  Pain in left toes  Consent was obtained for treatment procedures.   Mechanical debridement of nails 1-5  bilaterally performed with a nail nipper.  Filed with dremel without incident.     Return office visit   3 months                   Told patient to return for periodic foot care and evaluation due to potential at risk complications.   [name] DPM

## 2024-02-18 NOTE — Telephone Encounter (Signed)
 Call to f/u on BP - n/a  LVM

## 2024-02-19 NOTE — Telephone Encounter (Signed)
 Pt called back reports her BP 125/87, 132/92, 120/83, 110/79, 165/100 153/94 143/93, 140/90 and 133/87 off of the minoxidil  - will assess BP in one more week can consider adding hydralazine  if BP remains elevated

## 2024-02-20 ENCOUNTER — Ambulatory Visit
Admission: RE | Admit: 2024-02-20 | Discharge: 2024-02-20 | Disposition: A | Discharge status: Home / Self Care | Source: Ambulatory Visit | Attending: Family Medicine | Admitting: Family Medicine

## 2024-02-20 DIAGNOSIS — Z1231 Encounter for screening mammogram for malignant neoplasm of breast: Secondary | ICD-10-CM

## 2024-02-25 MED ORDER — HYDRALAZINE HCL 10 MG PO TABS: 10.0000 mg | ORAL_TABLET | Freq: Three times a day (TID) | ORAL | 3 refills | Status: DC

## 2024-02-25 NOTE — Telephone Encounter (Signed)
 Called patient for blood pressure follow-up. She reports that her diastolic blood pressure has recently increased, ranging from the high 90s to 102 mmHg. Patient states she was previously on hydralazine  100 mg daily, but this was changed to irbesartan  150 mg daily in 2014 for better blood pressure control. To help achieve goal blood pressure, will add hydralazine  10 mg orally three times daily to her current antihypertensive regimen. Patient was advised on the purpose of this adjustment and the importance of monitoring blood pressure. She verbalized understanding.  OV scheduled for Apr 21, 2024 with me for follow up.

## 2024-02-25 NOTE — Addendum Note (Signed)
 Addended by: Kieryn Burtis K on: 02/25/2024 03:45 PM   Modules accepted: Orders

## 2024-03-02 MED ORDER — HYDRALAZINE HCL 10 MG PO TABS: 20.0000 mg | ORAL_TABLET | Freq: Three times a day (TID) | ORAL | 1 refills | Status: AC

## 2024-03-02 NOTE — Telephone Encounter (Signed)
 Spoke to patient, her BP still remains in 148-150/95-98 range most days. Advised to up the hydralazine  dose from 10 mg three times daily to 20 mg three times daily and call us  back in a week if it is still elevated may need to up the dose to 50 mg three times daily

## 2024-03-02 NOTE — Addendum Note (Signed)
 Addended by: Nylia Gavina K on: 03/02/2024 08:38 AM   Modules accepted: Orders

## 2024-03-04 ENCOUNTER — Ambulatory Visit (HOSPITAL_COMMUNITY)
Admission: RE | Admit: 2024-03-04 | Discharge: 2024-03-04 | Disposition: A | Source: Ambulatory Visit | Attending: Physician Assistant | Admitting: Physician Assistant

## 2024-03-04 DIAGNOSIS — I3139 Other pericardial effusion (noninflammatory): Secondary | ICD-10-CM | POA: Diagnosis present

## 2024-03-05 ENCOUNTER — Ambulatory Visit: Payer: Self-pay | Admitting: Physician Assistant

## 2024-03-05 DIAGNOSIS — I77810 Thoracic aortic ectasia: Secondary | ICD-10-CM | POA: Insufficient documentation

## 2024-03-05 DIAGNOSIS — I3139 Other pericardial effusion (noninflammatory): Secondary | ICD-10-CM

## 2024-03-05 DIAGNOSIS — J90 Pleural effusion, not elsewhere classified: Secondary | ICD-10-CM

## 2024-03-12 ENCOUNTER — Ambulatory Visit (HOSPITAL_COMMUNITY)
Admission: RE | Admit: 2024-03-12 | Discharge: 2024-03-12 | Disposition: A | Source: Ambulatory Visit | Attending: Physician Assistant | Admitting: Physician Assistant

## 2024-03-12 DIAGNOSIS — J90 Pleural effusion, not elsewhere classified: Secondary | ICD-10-CM

## 2024-03-18 ENCOUNTER — Ambulatory Visit: Payer: Self-pay | Admitting: Physician Assistant

## 2024-03-18 LAB — ECHOCARDIOGRAM LIMITED
AR max vel: 1.97 cm2
AV Area VTI: 1.94 cm2
AV Area mean vel: 1.97 cm2
AV Mean grad: 3 mmHg
AV Peak grad: 7 mmHg
Ao pk vel: 1.32 m/s
Area-P 1/2: 5.58 cm2
S' Lateral: 2.71 cm

## 2024-04-01 ENCOUNTER — Other Ambulatory Visit: Payer: Self-pay | Admitting: Internal Medicine

## 2024-04-16 NOTE — Telephone Encounter (Signed)
 Cal to f/u on BP - pt just woke up - has been sick from viral infection. Will call later to discuss BP.

## 2024-04-21 ENCOUNTER — Ambulatory Visit: Attending: Cardiology | Admitting: Pharmacist

## 2024-04-21 ENCOUNTER — Encounter: Payer: Self-pay | Admitting: Pharmacist

## 2024-04-21 VITALS — BP 122/79 | HR 77

## 2024-04-21 DIAGNOSIS — I1A0 Resistant hypertension: Secondary | ICD-10-CM

## 2024-04-21 NOTE — Assessment & Plan Note (Signed)
 Assessment: In office BP 122/79 mmHg heart rate 77 (goal <130/80) Takes and tolerates BP meds well without any side effects except occasional headaches from increased dose hydralazine    Home BP ~ 115-125/70-80 on validated BP monitor   Plan:  Patient is back on clonidine  0.1 mg three times daily dose  Continue taking clonidine  0.3 mg patch every 7 days, irbesartan  300 mg daily, hydralazine  50 mg three times daily, nebivolol  10 mg at bedtime , and spironolactone  100 mg daily Patient to keep record of BP readings with heart rate and report to us  at the next visit Follow up with pharm D in 4 weeks  Follow up lab(s): none

## 2024-04-21 NOTE — Progress Notes (Signed)
 Patient ID: TALMA AGUILLARD                 DOB: 06/23/1945                      MRN: [id number]      HPI: Jade Sullivan is a 79 y.o. female referred by Dr. Wendel  to HTN clinic. PMH is significant for T2DM, hypertension, HLD, elevated Lpa, CKD stage 3. Aortic atherosclerosis  Renal ultra sound showed moderate blockage on one of the kidney arteries. At lst visit her BP monitor was validated and all meds were verified. Patient is very adherent to her meds and she follows low salt diet. Patient is on clonidine  patch and oral clonidine  0.2 mg three times daily. Given too high clonidine  dose and anticholinergic burden at last visit reduce clonidine  dose to 0.1 mg three times daily from 0.2 mg three times daily. Patient presented today for follow up. Brought in home BP log in 2 weeks for clonidine  dose reduction her BP started going in 140-150/89-95 range so she increased the dose back to 0.2 mg three times daily. From last 2 weeks her BP ~ 115-120/70-80 range.    Due to the patients pericardial effusion, minoxidil  was discontinued. Hydralazine  10?mg was added to her regimen to help lower blood pressure. The patient recently saw her nephrologist, who recommended tapering off clonidine  because of her advanced age and the associated anticholinergic burden. However, after discontinuing oral clonidine , the patients blood pressure began to rise, so she resumed clonidine  0.1?mg orally three times daily in addition to her transdermal clonidine  0.3?mg patch. Her hydralazine  dose was also increased to 50?mg three times daily. The patient presented today with a 10-day home blood pressure log. Since restarting oral clonidine , her blood pressure has returned to normal ranges, with readings such as 124/84, 128/71 (heart rate 80-71), and 117/76 (heart rate 78). She reports feeling generally well, although hydralazine  continues to cause headaches, for which she occasionally takes Excedrin. We discussed potentially  switching hydralazine  to a first-line agent such as a calcium  channel blocker (amlodipine). She reported that a dentist previously mentioned that amlodipine could affect gum health, though she did not personally experience gingival issues while on the medication. The patient is agreeable to trying amlodipine if the higher-dose hydralazine  continues to cause headaches.   Current HTN meds: clonidine  0.3 mg patch every 7 days, clonidine  0.1 mg 3 times daily, irbesartan  300 mg daily, , nebivolol  10 mg at bedtime , and spironolactone  100 mg daily, hydralazine  50 gm three times daily  Previously tried: amlodipine - gingival hyperplasia and hydrochlorothiazide- gout minoxidil  - C/I due to pericardial effusion BP goal: <130/80 Last lab: Cr Cl - 27 mL/min  Home monitor validated on 10/02/2023 reads within 10 points compared to office monitor  Family History:  Relation Problem Comments  Mother (Deceased) Colon polyps   Diabetes DM2  Hypertension   Pneumonia died from this condition    Father (Deceased) Colon polyps   Heart disease died from this condition    Sister (Deceased) Colon polyps   Kidney disease HBP and DM2; relative died from this condition    Brother (Deceased) Colon polyps   Heart disease MI - HBP and DM2    Brother (Deceased) Colon polyps     Social History:  Alcohol: none  Smoking: none   Diet: Patient follows a low-salt, home-cooked diet and avoids eating out.  Fluids: On restricted water intake due to CKD.  Exercise: Plans to begin a regular walking routine--30-minute walks every other day.     Wt Readings from Last 3 Encounters:  01/27/24 171 lb (77.6 kg)  12/30/23 176 lb 4.8 oz (80 kg)  12/08/23 176 lb 9.6 oz (80.1 kg)   BP Readings from Last 3 Encounters:  04/21/24 122/79  01/27/24 128/84  12/30/23 128/70   Pulse Readings from Last 3 Encounters:  04/21/24 77  01/27/24 84  12/30/23 74    Renal function: CrCl cannot be calculated (Patient's most  recent lab result is older than the maximum 21 days allowed.).  Past Medical History:  Diagnosis Date   Allergy    seasonal allergies   Arthritis    bilateral knees   Cataract    not a surgical candidate at this time (05/29/2020)   Chronic kidney disease    COVID-19 04/2019   Diabetes mellitus without complication (HCC)    on meds   Diverticulosis of colon (without mention of hemorrhage)    Dysmetabolic syndrome X    Glaucoma    on meds   Gout    H/O hyperthyroidism    By patient h/o hyperthyroidism with protosis, s/p subtotal thyroidectomy. On no replacement therapy. Last thyroid  functions 2013 in [place]   Plan Will need thyroid  functions Feb '15 with recommendations to follow.     Heart murmur    hx of   Hyperlipidemia    on meds   Hypertension    on multiple meds   Hypothyroidism    on meds   Kidney lesion    On supplemental oxygen  by nasal cannula    at bedtime   Pancreatic cyst    Tubular adenoma of colon     Current Outpatient Medications on File Prior to Visit  Medication Sig Dispense Refill   ALPRAZolam  (XANAX ) 0.25 MG tablet Take 1 tablet (0.25 mg total) by mouth 2 (two) times daily as needed for anxiety (prior to dentist). 20 tablet 0   aspirin EC 81 MG tablet Take 81 mg by mouth every evening. Swallow whole.     aspirin-acetaminophen -caffeine (EXCEDRIN MIGRAINE) 250-250-65 MG tablet Take 1-2 tablets by mouth every 6 (six) hours as needed for headache (sinus headaches/pain).     atorvastatin  (LIPITOR) 40 MG tablet TAKE 1 TABLET(40 MG) BY MOUTH DAILY 90 tablet 3   Cholecalciferol (VITAMIN D3 MAXIMUM STRENGTH) 125 MCG (5000 UT) capsule Take 5,000 Units by mouth daily.     cloNIDine  (CATAPRES  - DOSED IN MG/24 HR) 0.3 mg/24hr patch Place 0.3 mg onto the skin every Wednesday.     cloNIDine  (CATAPRES ) 0.2 MG tablet Take 1 tablet (0.2 mg total) by mouth 2 (two) times daily. 180 tablet 3   fish oil-omega-3 fatty acids 1000 MG capsule Take 1 g by mouth daily.      gabapentin  (NEURONTIN ) 100 MG capsule TAKE 2 CAPSULES(200 MG) BY MOUTH AT BEDTIME 180 capsule 0   hydrALAZINE  (APRESOLINE ) 10 MG tablet Take 2 tablets (20 mg total) by mouth 3 (three) times daily. 180 tablet 1   irbesartan  (AVAPRO ) 300 MG tablet TAKE 1 TABLET(300 MG) BY MOUTH IN THE MORNING 90 tablet 0   latanoprost  (XALATAN ) 0.005 % ophthalmic solution Place 1 drop into both eyes at bedtime.      levothyroxine  (SYNTHROID ) 75 MCG tablet Take 75-112.5 mcg by mouth See admin instructions. Take 112.5 mg on Mondays and Wednesdays, Take 75 mcg daily on all other days     LORazepam  (ATIVAN ) 1 MG tablet Take tablet by mouth  1 hour prior to exam 1 tablet 0   metFORMIN  (GLUCOPHAGE ) 500 MG tablet Take 500 mg by mouth daily.     Multiple Vitamin (MULTIVITAMIN WITH MINERALS) TABS tablet Take 2 tablets by mouth every evening.     nebivolol  (BYSTOLIC ) 10 MG tablet Take 1 tablet (10 mg total) by mouth at bedtime. 90 tablet 2   REPATHA  SURECLICK 140 MG/ML SOAJ ADMINISTER 1 ML UNDER THE SKIN EVERY 14 DAYS 2 mL 11   Semaglutide , 2 MG/DOSE, (OZEMPIC , 2 MG/DOSE,) 8 MG/3ML SOPN Inject 2 mg into the skin once a week. 2 mL 6   spironolactone  (ALDACTONE ) 100 MG tablet Take 100 mg by mouth every evening.     timolol  (TIMOPTIC ) 0.5 % ophthalmic solution Place 1 drop into both eyes 2 (two) times daily.     vitamin B-12 (CYANOCOBALAMIN) 500 MCG tablet Take 500 mcg by mouth 2 (two) times daily.     No current facility-administered medications on file prior to visit.    Allergies  Allergen Reactions   Amlodipine     Gingival hyperplasia   Hctz [Hydrochlorothiazide] Other (See Comments)    gout   Jardiance  [Empagliflozin ] Other (See Comments)    Yeast Infections    Blood pressure 122/79, pulse 77.   Assessment/Plan:  1. Hypertension -  Hypertension Assessment: In office BP 122/79 mmHg heart rate 77 (goal <130/80) Takes and tolerates BP meds well without any side effects except occasional headaches from  increased dose hydralazine    Home BP ~ 115-125/70-80 on validated BP monitor   Plan:  Patient is back on clonidine  0.1 mg three times daily dose  Continue taking clonidine  0.3 mg patch every 7 days, irbesartan  300 mg daily, hydralazine  50 mg three times daily, nebivolol  10 mg at bedtime , and spironolactone  100 mg daily Patient to keep record of BP readings with heart rate and report to us  at the next visit Follow up with pharm D in 4 weeks  Follow up lab(s): none     Thank you  [name] Blanch, Pharm.D [facility]. [facility] [address], [place], [place] [address] Phone: [phone]; Fax: [phone]

## 2024-04-23 NOTE — Progress Notes (Unsigned)
 " [facility] [address] [place] [address] Phone: [phone] Subjective:   LILLETTE Berwyn Posey, am serving as a scribe for Dr. Arthea Claudene.  I'm seeing this patient by the request  of:  Katina Pfeiffer, PA-C  CC: Bilateral knee pain  YEP:Dlagzrupcz  01/27/2024 Bilateral injections given today, tolerated the procedure well, discussed icing regimen and home exercise continue to monitor weight which patient has done well.  Patient still wants to avoid any surgical intervention at the moment.  Continue to keep diabetes under control.  Follow-up with me again in 3 months      Update 04/28/2024 Jade Sullivan is a 79 y.o. female coming in with complaint of B knee pain. Patient states that she is stiff.       Past Medical History:  Diagnosis Date   Allergy    seasonal allergies   Arthritis    bilateral knees   Cataract    not a surgical candidate at this time (05/29/2020)   Chronic kidney disease    COVID-19 04/2019   Diabetes mellitus without complication (HCC)    on meds   Diverticulosis of colon (without mention of hemorrhage)    Dysmetabolic syndrome X    Glaucoma    on meds   Gout    H/O hyperthyroidism    By patient h/o hyperthyroidism with protosis, s/p subtotal thyroidectomy. On no replacement therapy. Last thyroid  functions 2013 in [place]   Plan Will need thyroid  functions Feb '15 with recommendations to follow.     Heart murmur    hx of   Hyperlipidemia    on meds   Hypertension    on multiple meds   Hypothyroidism    on meds   Kidney lesion    On supplemental oxygen  by nasal cannula    at bedtime   Pancreatic cyst    Tubular adenoma of colon    Past Surgical History:  Procedure Laterality Date   BIOPSY  06/27/2020   Procedure: BIOPSY;  Surgeon: Albertus Gordy CHRISTELLA, MD;  Location: [facility];  Service: Gastroenterology;;   CESAREAN SECTION  1969   COLONOSCOPY  2016   JMP-MAC-2 day suprep (good)-TICS/TA   COLONOSCOPY  WITH PROPOFOL  N/A 06/27/2020   Procedure: COLONOSCOPY WITH PROPOFOL ;  Surgeon: Albertus Gordy CHRISTELLA, MD;  Location: [facility];  Service: Gastroenterology;  Laterality: N/A;   DILATATION & CURETTAGE/HYSTEROSCOPY WITH MYOSURE N/A 04/15/2023   Procedure: DILATATION & CURETTAGE/HYSTEROSCOPY WITH MYOSURE Myomectomy and endometrial biopsy;  Surgeon: Dallie Vera GAILS, MD;  Location: [facility] [place];  Service: Gynecology;  Laterality: N/A;   EYE SURGERY     LIPOMA EXCISION Right 11/24/2014   Procedure: EXCISION RIGHT SHOULDER LIPOMA;  Surgeon: Debby Shipper, MD;  Location: [facility];  Service: General;  Laterality: Right;   POLYPECTOMY  2016   TA   POLYPECTOMY  06/27/2020   Procedure: POLYPECTOMY;  Surgeon: Albertus Gordy CHRISTELLA, MD;  Location: [facility];  Service: Gastroenterology;;   RIGHT HEART CATH N/A 06/03/2023   Procedure: RIGHT HEART CATH;  Surgeon: Wendel Lurena POUR, MD;  Location: [facility];  Service: Cardiovascular;  Laterality: N/A;   THYROIDECTOMY, PARTIAL  1981   TONSILLECTOMY     UMBILICAL HERNIA REPAIR  2000   WISDOM TOOTH EXTRACTION     Social History   Socioeconomic History   Marital status: Divorced    Spouse name: Not on file   Number of children: 1   Years of education:  Not on file   Highest education level: Not on file  Occupational History   Occupation: Retired  Tobacco Use   Smoking status: Former   Smokeless tobacco: Never  Advertising Account Planner   Vaping status: Never Used  Substance and Sexual Activity   Alcohol use: No    Alcohol/week: 0.0 standard drinks of alcohol   Drug use: No   Sexual activity: Not Currently    Partners: Male    Birth control/protection: Post-menopausal    Comment: 1st intercourse- 16, partners- 5, divorced  Other Topics Concern   Not on file  Social History Narrative   Divorced   Retired - engineer, petroleum   Social Drivers of Health   Tobacco Use: Medium Risk (04/21/2024)   Patient History    Smoking Tobacco Use:  Former    Smokeless Tobacco Use: Never    Passive Exposure: Not on Actuary Strain: Low Risk (10/10/2021)   Overall Financial Resource Strain (CARDIA)    Difficulty of Paying Living Expenses: Not hard at all  Food Insecurity: No Food Insecurity (12/20/2021)   Hunger Vital Sign    Worried About Running Out of Food in the Last Year: Never true    Ran Out of Food in the Last Year: Never true  Transportation Needs: No Transportation Needs (12/20/2021)   PRAPARE - Administrator, Civil Service (Medical): No    Lack of Transportation (Non-Medical): No  Physical Activity: Sufficiently Active (10/10/2021)   Exercise Vital Sign    Days of Exercise per Week: 5 days    Minutes of Exercise per Session: 30 min  Stress: No Stress Concern Present (10/10/2021)   [employer] of Occupational Health - Occupational Stress Questionnaire    Feeling of Stress : Not at all  Social Connections: Moderately Integrated (10/10/2021)   Social Connection and Isolation Panel    Frequency of Communication with Friends and Family: More than three times a week    Frequency of Social Gatherings with Friends and Family: Twice a week    Attends Religious Services: More than 4 times per year    Active Member of Clubs or Organizations: Yes    Attends Banker Meetings: More than 4 times per year    Marital Status: Divorced  Depression (PHQ2-9): Low Risk (10/10/2021)   Depression (PHQ2-9)    PHQ-2 Score: 0  Alcohol Screen: Low Risk (10/10/2021)   Alcohol Screen    Last Alcohol Screening Score (AUDIT): 0  Housing: Low Risk (12/20/2021)   Housing    Last Housing Risk Score: 0  Utilities: Not At Risk (12/20/2021)   AHC Utilities    Threatened with loss of utilities: No  Health Literacy: Not on file   Allergies[1] Family History  Problem Relation Age of Onset   Diabetes Mother        DM2   Hypertension Mother    Pneumonia Mother        died from this condition   Colon polyps  Mother    Heart disease Father        died from this condition   Colon polyps Father    Kidney disease Sister        HBP and DM2; relative died from this condition   Colon polyps Sister    Heart disease Brother        MI - HBP and DM2   Colon polyps Brother    Colon polyps Brother     Current Outpatient  Medications (Endocrine & Metabolic):    levothyroxine  (SYNTHROID ) 75 MCG tablet, Take 75-112.5 mcg by mouth See admin instructions. Take 112.5 mg on Mondays and Wednesdays, Take 75 mcg daily on all other days   metFORMIN  (GLUCOPHAGE ) 500 MG tablet, Take 500 mg by mouth daily.   Semaglutide , 2 MG/DOSE, (OZEMPIC , 2 MG/DOSE,) 8 MG/3ML SOPN, Inject 2 mg into the skin once a week.  Current Outpatient Medications (Cardiovascular):    atorvastatin  (LIPITOR) 40 MG tablet, TAKE 1 TABLET(40 MG) BY MOUTH DAILY   cloNIDine  (CATAPRES  - DOSED IN MG/24 HR) 0.3 mg/24hr patch, Place 0.3 mg onto the skin every Wednesday.   cloNIDine  (CATAPRES ) 0.2 MG tablet, Take 1 tablet (0.2 mg total) by mouth 2 (two) times daily.   hydrALAZINE  (APRESOLINE ) 10 MG tablet, Take 2 tablets (20 mg total) by mouth 3 (three) times daily.   irbesartan  (AVAPRO ) 300 MG tablet, TAKE 1 TABLET(300 MG) BY MOUTH IN THE MORNING   nebivolol  (BYSTOLIC ) 10 MG tablet, Take 1 tablet (10 mg total) by mouth at bedtime.   REPATHA  SURECLICK 140 MG/ML SOAJ, ADMINISTER 1 ML UNDER THE SKIN EVERY 14 DAYS   spironolactone  (ALDACTONE ) 100 MG tablet, Take 100 mg by mouth every evening.  Current Outpatient Medications (Analgesics):    aspirin EC 81 MG tablet, Take 81 mg by mouth every evening. Swallow whole.   aspirin-acetaminophen -caffeine (EXCEDRIN MIGRAINE) 250-250-65 MG tablet, Take 1-2 tablets by mouth every 6 (six) hours as needed for headache (sinus headaches/pain).  Current Outpatient Medications (Hematological):    vitamin B-12 (CYANOCOBALAMIN) 500 MCG tablet, Take 500 mcg by mouth 2 (two) times daily.  Current Outpatient Medications  (Other):    ALPRAZolam  (XANAX ) 0.25 MG tablet, Take 1 tablet (0.25 mg total) by mouth 2 (two) times daily as needed for anxiety (prior to dentist).   Cholecalciferol (VITAMIN D3 MAXIMUM STRENGTH) 125 MCG (5000 UT) capsule, Take 5,000 Units by mouth daily.   fish oil-omega-3 fatty acids 1000 MG capsule, Take 1 g by mouth daily.   gabapentin  (NEURONTIN ) 100 MG capsule, TAKE 2 CAPSULES(200 MG) BY MOUTH AT BEDTIME   latanoprost  (XALATAN ) 0.005 % ophthalmic solution, Place 1 drop into both eyes at bedtime.    LORazepam  (ATIVAN ) 1 MG tablet, Take tablet by mouth 1 hour prior to exam   Multiple Vitamin (MULTIVITAMIN WITH MINERALS) TABS tablet, Take 2 tablets by mouth every evening.   timolol  (TIMOPTIC ) 0.5 % ophthalmic solution, Place 1 drop into both eyes 2 (two) times daily.   Reviewed prior external information including notes and imaging from  primary care provider As well as notes that were available from care everywhere and other healthcare systems.  Past medical history, social, surgical and family history all reviewed in electronic medical record.  No pertanent information unless stated regarding to the chief complaint.   Review of Systems:  No headache, visual changes, nausea, vomiting, diarrhea, constipation, dizziness, abdominal pain, skin rash, fevers, chills, night sweats, weight loss, swollen lymph nodes, body aches, chest pain, shortness of breath, mood changes. POSITIVE muscle aches, joint swelling  Objective  Blood pressure (!) 128/92, pulse 61, height 5' 4 (1.626 m), weight 167 lb (75.8 kg), SpO2 100%.   General: No apparent distress alert and oriented x3 mood and affect normal, dressed appropriately.  HEENT: Pupils equal, extraocular movements intact  Respiratory: Patient's speak in full sentences and does not appear short of breath  Antalgic gait noted.  Instability noted with valgus and varus force of the knees.  Crepitus noted.  After  informed written and verbal consent,  patient was seated on exam table. Right knee was prepped with alcohol swab and utilizing anterolateral approach, patient's right knee space was injected with 4:1  marcaine  0.5%: Kenalog  40mg /dL. Patient tolerated the procedure well without immediate complications.   Plan after informed written and verbal consent, patient was seated on exam table. Left knee was prepped with alcohol swab and utilizing anterolateral approach, patient's left knee space was injected with 4:1  marcaine  0.5%: Kenalog  40mg /dL. Patient tolerated the procedure well without immediate complications.     Impression and Recommendations:    The above documentation has been reviewed and is accurate and complete Arthea CHRISTELLA Sharps, DO        [1]  Allergies Allergen Reactions   Amlodipine     Gingival hyperplasia   Hctz [Hydrochlorothiazide] Other (See Comments)    gout   Jardiance  [Empagliflozin ] Other (See Comments)    Yeast Infections   "

## 2024-04-28 ENCOUNTER — Ambulatory Visit: Admitting: Family Medicine

## 2024-04-28 ENCOUNTER — Encounter: Payer: Self-pay | Admitting: Family Medicine

## 2024-04-28 VITALS — BP 128/92 | HR 61 | Ht 64.0 in | Wt 167.0 lb

## 2024-04-28 DIAGNOSIS — E1122 Type 2 diabetes mellitus with diabetic chronic kidney disease: Secondary | ICD-10-CM

## 2024-04-28 DIAGNOSIS — M170 Bilateral primary osteoarthritis of knee: Secondary | ICD-10-CM

## 2024-04-28 DIAGNOSIS — N1831 Chronic kidney disease, stage 3a: Secondary | ICD-10-CM

## 2024-04-28 NOTE — Patient Instructions (Signed)
 Injected both knees today See me in 10-12 weeks

## 2024-04-28 NOTE — Assessment & Plan Note (Signed)
 Chronic problem with exacerbation.  The patient would like to avoid any surgical intervention.  The patient's BMI is under 30 so would be a surgical candidate if needed.  Patient no feels that the injections do help her and allows her to be active as well as independent.  Follow-up with me again in 3 months otherwise.  Will check blood sugars closer.  Unable to do anti-inflammatories secondary to chronic kidney disease

## 2024-05-13 ENCOUNTER — Ambulatory Visit: Admitting: Podiatry

## 2024-05-31 ENCOUNTER — Ambulatory Visit: Admitting: Pharmacist

## 2024-06-14 ENCOUNTER — Encounter: Payer: Medicare Other | Admitting: Obstetrics and Gynecology

## 2024-07-07 ENCOUNTER — Ambulatory Visit: Admitting: Family Medicine

## 2024-07-09 ENCOUNTER — Ambulatory Visit: Admitting: Physician Assistant
